# Patient Record
Sex: Male | Born: 1940 | Race: White | Hispanic: No | Marital: Married | Smoking: Never smoker
Health system: Southern US, Community
[De-identification: ages and names within clinical notes are randomized; demographics above are authoritative.]

## PROBLEM LIST (undated history)

## (undated) DIAGNOSIS — A419 Sepsis, unspecified organism: Secondary | ICD-10-CM

## (undated) DIAGNOSIS — N179 Acute kidney failure, unspecified: Secondary | ICD-10-CM

## (undated) DIAGNOSIS — K559 Vascular disorder of intestine, unspecified: Secondary | ICD-10-CM

## (undated) DIAGNOSIS — N289 Disorder of kidney and ureter, unspecified: Secondary | ICD-10-CM

## (undated) DIAGNOSIS — I4891 Unspecified atrial fibrillation: Secondary | ICD-10-CM

## (undated) HISTORY — PX: ABDOMINAL SURGERY: SHX537

---

## 2013-07-26 ENCOUNTER — Inpatient Hospital Stay: Payer: Self-pay | Admitting: Internal Medicine

## 2013-07-26 DIAGNOSIS — I959 Hypotension, unspecified: Secondary | ICD-10-CM | POA: Diagnosis not present

## 2013-07-26 DIAGNOSIS — J9589 Other postprocedural complications and disorders of respiratory system, not elsewhere classified: Secondary | ICD-10-CM | POA: Diagnosis not present

## 2013-07-26 DIAGNOSIS — R0989 Other specified symptoms and signs involving the circulatory and respiratory systems: Secondary | ICD-10-CM | POA: Diagnosis not present

## 2013-07-26 DIAGNOSIS — J9584 Transfusion-related acute lung injury (TRALI): Secondary | ICD-10-CM | POA: Diagnosis not present

## 2013-07-26 DIAGNOSIS — J9819 Other pulmonary collapse: Secondary | ICD-10-CM | POA: Diagnosis not present

## 2013-07-26 DIAGNOSIS — K450 Other specified abdominal hernia with obstruction, without gangrene: Secondary | ICD-10-CM | POA: Diagnosis present

## 2013-07-26 DIAGNOSIS — R141 Gas pain: Secondary | ICD-10-CM | POA: Diagnosis not present

## 2013-07-26 DIAGNOSIS — J95821 Acute postprocedural respiratory failure: Secondary | ICD-10-CM | POA: Diagnosis not present

## 2013-07-26 DIAGNOSIS — E872 Acidosis, unspecified: Secondary | ICD-10-CM | POA: Diagnosis not present

## 2013-07-26 DIAGNOSIS — I509 Heart failure, unspecified: Secondary | ICD-10-CM | POA: Diagnosis not present

## 2013-07-26 DIAGNOSIS — I4891 Unspecified atrial fibrillation: Secondary | ICD-10-CM | POA: Diagnosis not present

## 2013-07-26 DIAGNOSIS — N179 Acute kidney failure, unspecified: Secondary | ICD-10-CM | POA: Diagnosis not present

## 2013-07-26 DIAGNOSIS — Q619 Cystic kidney disease, unspecified: Secondary | ICD-10-CM | POA: Diagnosis not present

## 2013-07-26 DIAGNOSIS — R188 Other ascites: Secondary | ICD-10-CM | POA: Diagnosis present

## 2013-07-26 DIAGNOSIS — R34 Anuria and oliguria: Secondary | ICD-10-CM | POA: Diagnosis present

## 2013-07-26 DIAGNOSIS — R3989 Other symptoms and signs involving the genitourinary system: Secondary | ICD-10-CM | POA: Diagnosis present

## 2013-07-26 DIAGNOSIS — N186 End stage renal disease: Secondary | ICD-10-CM | POA: Diagnosis not present

## 2013-07-26 DIAGNOSIS — E860 Dehydration: Secondary | ICD-10-CM | POA: Diagnosis not present

## 2013-07-26 DIAGNOSIS — J90 Pleural effusion, not elsewhere classified: Secondary | ICD-10-CM | POA: Diagnosis present

## 2013-07-26 DIAGNOSIS — R6520 Severe sepsis without septic shock: Secondary | ICD-10-CM | POA: Diagnosis present

## 2013-07-26 DIAGNOSIS — I517 Cardiomegaly: Secondary | ICD-10-CM | POA: Diagnosis not present

## 2013-07-26 DIAGNOSIS — J984 Other disorders of lung: Secondary | ICD-10-CM | POA: Diagnosis not present

## 2013-07-26 DIAGNOSIS — K559 Vascular disorder of intestine, unspecified: Secondary | ICD-10-CM | POA: Diagnosis not present

## 2013-07-26 DIAGNOSIS — R4182 Altered mental status, unspecified: Secondary | ICD-10-CM | POA: Diagnosis not present

## 2013-07-26 DIAGNOSIS — I248 Other forms of acute ischemic heart disease: Secondary | ICD-10-CM | POA: Diagnosis present

## 2013-07-26 DIAGNOSIS — A419 Sepsis, unspecified organism: Secondary | ICD-10-CM | POA: Diagnosis not present

## 2013-07-26 DIAGNOSIS — M259 Joint disorder, unspecified: Secondary | ICD-10-CM | POA: Diagnosis not present

## 2013-07-26 DIAGNOSIS — J9600 Acute respiratory failure, unspecified whether with hypoxia or hypercapnia: Secondary | ICD-10-CM | POA: Diagnosis not present

## 2013-07-26 DIAGNOSIS — R748 Abnormal levels of other serum enzymes: Secondary | ICD-10-CM | POA: Diagnosis not present

## 2013-07-26 DIAGNOSIS — I259 Chronic ischemic heart disease, unspecified: Secondary | ICD-10-CM | POA: Diagnosis not present

## 2013-07-26 DIAGNOSIS — J9503 Malfunction of tracheostomy stoma: Secondary | ICD-10-CM | POA: Diagnosis not present

## 2013-07-26 DIAGNOSIS — D649 Anemia, unspecified: Secondary | ICD-10-CM | POA: Diagnosis not present

## 2013-07-26 DIAGNOSIS — R7989 Other specified abnormal findings of blood chemistry: Secondary | ICD-10-CM | POA: Diagnosis not present

## 2013-07-26 DIAGNOSIS — Z4682 Encounter for fitting and adjustment of non-vascular catheter: Secondary | ICD-10-CM | POA: Diagnosis not present

## 2013-07-26 DIAGNOSIS — Z9911 Dependence on respirator [ventilator] status: Secondary | ICD-10-CM | POA: Diagnosis not present

## 2013-07-26 DIAGNOSIS — R918 Other nonspecific abnormal finding of lung field: Secondary | ICD-10-CM | POA: Diagnosis not present

## 2013-07-26 DIAGNOSIS — E43 Unspecified severe protein-calorie malnutrition: Secondary | ICD-10-CM | POA: Diagnosis not present

## 2013-07-26 DIAGNOSIS — I471 Supraventricular tachycardia: Secondary | ICD-10-CM | POA: Diagnosis not present

## 2013-07-26 DIAGNOSIS — R112 Nausea with vomiting, unspecified: Secondary | ICD-10-CM | POA: Diagnosis not present

## 2013-07-26 DIAGNOSIS — R143 Flatulence: Secondary | ICD-10-CM | POA: Diagnosis not present

## 2013-07-26 DIAGNOSIS — R5381 Other malaise: Secondary | ICD-10-CM | POA: Diagnosis present

## 2013-07-26 DIAGNOSIS — D62 Acute posthemorrhagic anemia: Secondary | ICD-10-CM | POA: Diagnosis not present

## 2013-07-26 DIAGNOSIS — K922 Gastrointestinal hemorrhage, unspecified: Secondary | ICD-10-CM | POA: Diagnosis not present

## 2013-07-26 DIAGNOSIS — K56609 Unspecified intestinal obstruction, unspecified as to partial versus complete obstruction: Secondary | ICD-10-CM | POA: Diagnosis not present

## 2013-07-26 DIAGNOSIS — N19 Unspecified kidney failure: Secondary | ICD-10-CM | POA: Diagnosis not present

## 2013-07-26 DIAGNOSIS — R64 Cachexia: Secondary | ICD-10-CM | POA: Diagnosis present

## 2013-07-26 DIAGNOSIS — N170 Acute kidney failure with tubular necrosis: Secondary | ICD-10-CM | POA: Diagnosis present

## 2013-07-26 DIAGNOSIS — D72829 Elevated white blood cell count, unspecified: Secondary | ICD-10-CM | POA: Diagnosis not present

## 2013-07-26 DIAGNOSIS — K5669 Other intestinal obstruction: Secondary | ICD-10-CM | POA: Diagnosis not present

## 2013-07-26 DIAGNOSIS — R55 Syncope and collapse: Secondary | ICD-10-CM | POA: Diagnosis not present

## 2013-07-26 LAB — TROPONIN I
Troponin-I: 0.15 ng/mL — ABNORMAL HIGH
Troponin-I: 0.2 ng/mL — ABNORMAL HIGH
Troponin-I: 0.28 ng/mL — ABNORMAL HIGH

## 2013-07-26 LAB — CK TOTAL AND CKMB (NOT AT ARMC)
CK, Total: 650 U/L — ABNORMAL HIGH (ref 35–232)
CK, Total: 920 U/L — ABNORMAL HIGH (ref 35–232)
CK-MB: 9.2 ng/mL — ABNORMAL HIGH (ref 0.5–3.6)
CK-MB: 12.6 ng/mL — ABNORMAL HIGH (ref 0.5–3.6)

## 2013-07-26 LAB — CBC
HCT: 43.4 % (ref 40.0–52.0)
HGB: 14.3 g/dL (ref 13.0–18.0)
MCH: 31.4 pg (ref 26.0–34.0)
MCHC: 33.1 g/dL (ref 32.0–36.0)
MCV: 95 fL (ref 80–100)
Platelet: 221 10*3/uL (ref 150–440)
RBC: 4.56 10*6/uL (ref 4.40–5.90)
RDW: 13.4 % (ref 11.5–14.5)
WBC: 23.2 10*3/uL — ABNORMAL HIGH (ref 3.8–10.6)

## 2013-07-26 LAB — COMPREHENSIVE METABOLIC PANEL
ALBUMIN: 3.3 g/dL — AB (ref 3.4–5.0)
ALT: 24 U/L (ref 12–78)
AST: 28 U/L (ref 15–37)
Alkaline Phosphatase: 74 U/L
Anion Gap: 18 — ABNORMAL HIGH (ref 7–16)
BUN: 62 mg/dL — AB (ref 7–18)
Bilirubin,Total: 0.4 mg/dL (ref 0.2–1.0)
CALCIUM: 8.7 mg/dL (ref 8.5–10.1)
CO2: 18 mmol/L — AB (ref 21–32)
CREATININE: 4.91 mg/dL — AB (ref 0.60–1.30)
Chloride: 93 mmol/L — ABNORMAL LOW (ref 98–107)
EGFR (African American): 13 — ABNORMAL LOW
EGFR (Non-African Amer.): 11 — ABNORMAL LOW
Glucose: 119 mg/dL — ABNORMAL HIGH (ref 65–99)
Osmolality: 278 (ref 275–301)
Potassium: 3.8 mmol/L (ref 3.5–5.1)
Sodium: 129 mmol/L — ABNORMAL LOW (ref 136–145)
Total Protein: 7.4 g/dL (ref 6.4–8.2)

## 2013-07-26 LAB — GASTROCCULT (ARMC)
Occult Blood, Gastric: POSITIVE
Ph, Gastric: 3 (ref 1–3)

## 2013-07-26 LAB — LIPASE, BLOOD: LIPASE: 63 U/L — AB (ref 73–393)

## 2013-07-26 LAB — HEMOGLOBIN: HGB: 12.5 g/dL — ABNORMAL LOW (ref 13.0–18.0)

## 2013-07-27 DIAGNOSIS — N179 Acute kidney failure, unspecified: Secondary | ICD-10-CM

## 2013-07-27 LAB — BASIC METABOLIC PANEL
ANION GAP: 8 (ref 7–16)
Anion Gap: 10 (ref 7–16)
Anion Gap: 9 (ref 7–16)
Anion Gap: 6 — ABNORMAL LOW (ref 7–16)
Anion Gap: 10 (ref 7–16)
Anion Gap: 8 (ref 7–16)
BUN: 74 mg/dL — ABNORMAL HIGH (ref 7–18)
BUN: 72 mg/dL — ABNORMAL HIGH (ref 7–18)
BUN: 69 mg/dL — AB (ref 7–18)
BUN: 62 mg/dL — ABNORMAL HIGH (ref 7–18)
BUN: 60 mg/dL — AB (ref 7–18)
BUN: 58 mg/dL — ABNORMAL HIGH (ref 7–18)
CALCIUM: 6.5 mg/dL — AB (ref 8.5–10.1)
CHLORIDE: 102 mmol/L (ref 98–107)
CHLORIDE: 101 mmol/L (ref 98–107)
CO2: 23 mmol/L (ref 21–32)
CO2: 20 mmol/L — AB (ref 21–32)
CO2: 22 mmol/L (ref 21–32)
Calcium, Total: 6.3 mg/dL — CL (ref 8.5–10.1)
Calcium, Total: 6.7 mg/dL — CL (ref 8.5–10.1)
Calcium, Total: 6.6 mg/dL — CL (ref 8.5–10.1)
Calcium, Total: 7.1 mg/dL — ABNORMAL LOW (ref 8.5–10.1)
Calcium, Total: 7.2 mg/dL — ABNORMAL LOW (ref 8.5–10.1)
Chloride: 101 mmol/L (ref 98–107)
Chloride: 101 mmol/L (ref 98–107)
Chloride: 101 mmol/L (ref 98–107)
Chloride: 101 mmol/L (ref 98–107)
Co2: 19 mmol/L — ABNORMAL LOW (ref 21–32)
Co2: 20 mmol/L — ABNORMAL LOW (ref 21–32)
Co2: 23 mmol/L (ref 21–32)
Creatinine: 5.75 mg/dL — ABNORMAL HIGH (ref 0.60–1.30)
Creatinine: 6.36 mg/dL — ABNORMAL HIGH (ref 0.60–1.30)
Creatinine: 5.53 mg/dL — ABNORMAL HIGH (ref 0.60–1.30)
Creatinine: 5.14 mg/dL — ABNORMAL HIGH (ref 0.60–1.30)
Creatinine: 5.05 mg/dL — ABNORMAL HIGH (ref 0.60–1.30)
Creatinine: 4.85 mg/dL — ABNORMAL HIGH (ref 0.60–1.30)
EGFR (African American): 11 — ABNORMAL LOW
EGFR (African American): 12 — ABNORMAL LOW
EGFR (African American): 12 — ABNORMAL LOW
EGFR (African American): 13 — ABNORMAL LOW
EGFR (Non-African Amer.): 9 — ABNORMAL LOW
EGFR (Non-African Amer.): 8 — ABNORMAL LOW
EGFR (Non-African Amer.): 10 — ABNORMAL LOW
EGFR (Non-African Amer.): 10 — ABNORMAL LOW
EGFR (Non-African Amer.): 11 — ABNORMAL LOW
GFR CALC AF AMER: 9 — AB
GFR CALC AF AMER: 11 — AB
GFR CALC NON AF AMER: 11 — AB
GLUCOSE: 113 mg/dL — AB (ref 65–99)
GLUCOSE: 112 mg/dL — AB (ref 65–99)
GLUCOSE: 108 mg/dL — AB (ref 65–99)
Glucose: 124 mg/dL — ABNORMAL HIGH (ref 65–99)
Glucose: 137 mg/dL — ABNORMAL HIGH (ref 65–99)
Glucose: 114 mg/dL — ABNORMAL HIGH (ref 65–99)
OSMOLALITY: 283 (ref 275–301)
OSMOLALITY: 282 (ref 275–301)
OSMOLALITY: 282 (ref 275–301)
Osmolality: 284 (ref 275–301)
Osmolality: 282 (ref 275–301)
Osmolality: 282 (ref 275–301)
POTASSIUM: 4.5 mmol/L (ref 3.5–5.1)
POTASSIUM: 4.6 mmol/L (ref 3.5–5.1)
POTASSIUM: 4.5 mmol/L (ref 3.5–5.1)
Potassium: 4.4 mmol/L (ref 3.5–5.1)
Potassium: 4.5 mmol/L (ref 3.5–5.1)
Potassium: 4.6 mmol/L (ref 3.5–5.1)
Sodium: 130 mmol/L — ABNORMAL LOW (ref 136–145)
Sodium: 130 mmol/L — ABNORMAL LOW (ref 136–145)
Sodium: 130 mmol/L — ABNORMAL LOW (ref 136–145)
Sodium: 131 mmol/L — ABNORMAL LOW (ref 136–145)
Sodium: 132 mmol/L — ABNORMAL LOW (ref 136–145)
Sodium: 132 mmol/L — ABNORMAL LOW (ref 136–145)

## 2013-07-27 LAB — URINALYSIS, COMPLETE
Bilirubin,UR: NEGATIVE
Ketone: NEGATIVE
Nitrite: NEGATIVE
PH: 5 (ref 4.5–8.0)
RBC,UR: 82 /HPF (ref 0–5)
SPECIFIC GRAVITY: 1.012 (ref 1.003–1.030)
Squamous Epithelial: 1
WBC UR: 77 /HPF (ref 0–5)

## 2013-07-27 LAB — CBC WITH DIFFERENTIAL/PLATELET
BASOS ABS: 0 10*3/uL (ref 0.0–0.1)
Basophil %: 0.1 %
Eosinophil #: 0 10*3/uL (ref 0.0–0.7)
Eosinophil %: 0.2 %
HCT: 29.9 % — ABNORMAL LOW (ref 40.0–52.0)
HGB: 10.2 g/dL — ABNORMAL LOW (ref 13.0–18.0)
LYMPHS ABS: 1.9 10*3/uL (ref 1.0–3.6)
Lymphocyte %: 30.1 %
MCH: 32.5 pg (ref 26.0–34.0)
MCHC: 33.9 g/dL (ref 32.0–36.0)
MCV: 96 fL (ref 80–100)
Monocyte #: 0.7 x10 3/mm (ref 0.2–1.0)
Monocyte %: 11.1 %
NEUTROS PCT: 58.5 %
Neutrophil #: 3.8 10*3/uL (ref 1.4–6.5)
Platelet: 153 10*3/uL (ref 150–440)
RBC: 3.12 10*6/uL — ABNORMAL LOW (ref 4.40–5.90)
RDW: 13.5 % (ref 11.5–14.5)
WBC: 6.5 10*3/uL (ref 3.8–10.6)

## 2013-07-27 LAB — CREATININE, URINE, RANDOM: CREATININE, URINE RANDOM: 61.1 mg/dL (ref 30.0–125.0)

## 2013-07-27 LAB — HEMOGLOBIN
HGB: 10.5 g/dL — ABNORMAL LOW (ref 13.0–18.0)
HGB: 9.3 g/dL — ABNORMAL LOW (ref 13.0–18.0)

## 2013-07-27 LAB — LIPID PANEL
CHOLESTEROL: 54 mg/dL (ref 0–200)
HDL: 13 mg/dL — AB (ref 40–60)
Ldl Cholesterol, Calc: 14 mg/dL (ref 0–100)
TRIGLYCERIDES: 137 mg/dL (ref 0–200)
VLDL Cholesterol, Calc: 27 mg/dL (ref 5–40)

## 2013-07-27 LAB — TSH: THYROID STIMULATING HORM: 5.17 u[IU]/mL — AB

## 2013-07-27 LAB — PROTEIN, URINE, RANDOM: PROTEIN, RANDOM URINE: 521 mg/dL — AB (ref 0–12)

## 2013-07-28 LAB — BASIC METABOLIC PANEL
ANION GAP: 4 — AB (ref 7–16)
ANION GAP: 5 — AB (ref 7–16)
ANION GAP: 7 (ref 7–16)
Anion Gap: 6 — ABNORMAL LOW (ref 7–16)
Anion Gap: 7 (ref 7–16)
Anion Gap: 6 — ABNORMAL LOW (ref 7–16)
Anion Gap: 5 — ABNORMAL LOW (ref 7–16)
BUN: 53 mg/dL — ABNORMAL HIGH (ref 7–18)
BUN: 46 mg/dL — ABNORMAL HIGH (ref 7–18)
BUN: 36 mg/dL — ABNORMAL HIGH (ref 7–18)
BUN: 34 mg/dL — ABNORMAL HIGH (ref 7–18)
BUN: 40 mg/dL — AB (ref 7–18)
BUN: 55 mg/dL — AB (ref 7–18)
BUN: 31 mg/dL — ABNORMAL HIGH (ref 7–18)
CALCIUM: 7.6 mg/dL — AB (ref 8.5–10.1)
CALCIUM: 7.7 mg/dL — AB (ref 8.5–10.1)
CALCIUM: 7.2 mg/dL — AB (ref 8.5–10.1)
CHLORIDE: 106 mmol/L (ref 98–107)
CHLORIDE: 101 mmol/L (ref 98–107)
CHLORIDE: 104 mmol/L (ref 98–107)
CO2: 27 mmol/L (ref 21–32)
CO2: 24 mmol/L (ref 21–32)
CO2: 28 mmol/L (ref 21–32)
CREATININE: 3.65 mg/dL — AB (ref 0.60–1.30)
Calcium, Total: 7 mg/dL — CL (ref 8.5–10.1)
Calcium, Total: 7 mg/dL — CL (ref 8.5–10.1)
Calcium, Total: 7.2 mg/dL — ABNORMAL LOW (ref 8.5–10.1)
Calcium, Total: 7.5 mg/dL — ABNORMAL LOW (ref 8.5–10.1)
Chloride: 100 mmol/L (ref 98–107)
Chloride: 101 mmol/L (ref 98–107)
Chloride: 104 mmol/L (ref 98–107)
Chloride: 105 mmol/L (ref 98–107)
Co2: 25 mmol/L (ref 21–32)
Co2: 26 mmol/L (ref 21–32)
Co2: 27 mmol/L (ref 21–32)
Co2: 28 mmol/L (ref 21–32)
Creatinine: 2.09 mg/dL — ABNORMAL HIGH (ref 0.60–1.30)
Creatinine: 2.38 mg/dL — ABNORMAL HIGH (ref 0.60–1.30)
Creatinine: 2.67 mg/dL — ABNORMAL HIGH (ref 0.60–1.30)
Creatinine: 3.22 mg/dL — ABNORMAL HIGH (ref 0.60–1.30)
Creatinine: 4.47 mg/dL — ABNORMAL HIGH (ref 0.60–1.30)
Creatinine: 4.51 mg/dL — ABNORMAL HIGH (ref 0.60–1.30)
EGFR (African American): 14 — ABNORMAL LOW
EGFR (African American): 21 — ABNORMAL LOW
EGFR (African American): 27 — ABNORMAL LOW
EGFR (African American): 31 — ABNORMAL LOW
EGFR (African American): 36 — ABNORMAL LOW
EGFR (Non-African Amer.): 12 — ABNORMAL LOW
EGFR (Non-African Amer.): 16 — ABNORMAL LOW
EGFR (Non-African Amer.): 18 — ABNORMAL LOW
EGFR (Non-African Amer.): 23 — ABNORMAL LOW
EGFR (Non-African Amer.): 27 — ABNORMAL LOW
EGFR (Non-African Amer.): 31 — ABNORMAL LOW
GFR CALC AF AMER: 18 — AB
GFR CALC AF AMER: 14 — AB
GFR CALC NON AF AMER: 12 — AB
GLUCOSE: 109 mg/dL — AB (ref 65–99)
GLUCOSE: 130 mg/dL — AB (ref 65–99)
GLUCOSE: 93 mg/dL (ref 65–99)
Glucose: 130 mg/dL — ABNORMAL HIGH (ref 65–99)
Glucose: 83 mg/dL (ref 65–99)
Glucose: 86 mg/dL (ref 65–99)
Glucose: 93 mg/dL (ref 65–99)
OSMOLALITY: 281 (ref 275–301)
OSMOLALITY: 281 (ref 275–301)
OSMOLALITY: 283 (ref 275–301)
Osmolality: 279 (ref 275–301)
Osmolality: 281 (ref 275–301)
Osmolality: 282 (ref 275–301)
Osmolality: 281 (ref 275–301)
POTASSIUM: 4.3 mmol/L (ref 3.5–5.1)
POTASSIUM: 4.2 mmol/L (ref 3.5–5.1)
POTASSIUM: 4.3 mmol/L (ref 3.5–5.1)
POTASSIUM: 4.3 mmol/L (ref 3.5–5.1)
POTASSIUM: 4.4 mmol/L (ref 3.5–5.1)
Potassium: 4 mmol/L (ref 3.5–5.1)
Potassium: 3.8 mmol/L (ref 3.5–5.1)
SODIUM: 134 mmol/L — AB (ref 136–145)
SODIUM: 137 mmol/L (ref 136–145)
SODIUM: 138 mmol/L (ref 136–145)
SODIUM: 131 mmol/L — AB (ref 136–145)
Sodium: 132 mmol/L — ABNORMAL LOW (ref 136–145)
Sodium: 137 mmol/L (ref 136–145)
Sodium: 137 mmol/L (ref 136–145)

## 2013-07-28 LAB — CBC WITH DIFFERENTIAL/PLATELET
Basophil #: 0 10*3/uL (ref 0.0–0.1)
Basophil %: 0.2 %
Eosinophil #: 0 10*3/uL (ref 0.0–0.7)
Eosinophil %: 0.2 %
HCT: 24.1 % — ABNORMAL LOW (ref 40.0–52.0)
HGB: 8.5 g/dL — AB (ref 13.0–18.0)
Lymphocyte #: 0.5 10*3/uL — ABNORMAL LOW (ref 1.0–3.6)
Lymphocyte %: 3.9 %
MCH: 32.6 pg (ref 26.0–34.0)
MCHC: 35 g/dL (ref 32.0–36.0)
MCV: 93 fL (ref 80–100)
MONOS PCT: 7 %
Monocyte #: 1 x10 3/mm (ref 0.2–1.0)
Neutrophil #: 12.1 10*3/uL — ABNORMAL HIGH (ref 1.4–6.5)
Neutrophil %: 88.7 %
Platelet: 114 10*3/uL — ABNORMAL LOW (ref 150–440)
RBC: 2.6 10*6/uL — AB (ref 4.40–5.90)
RDW: 13 % (ref 11.5–14.5)
WBC: 13.6 10*3/uL — AB (ref 3.8–10.6)

## 2013-07-28 LAB — PHOSPHORUS: Phosphorus: 4 mg/dL (ref 2.5–4.9)

## 2013-07-29 LAB — TPN PANEL
ACTIVATED PTT: 42.7 s — AB (ref 23.6–35.9)
ALBUMIN: 1.4 g/dL — AB (ref 3.4–5.0)
AST: 71 U/L — AB (ref 15–37)
Alkaline Phosphatase: 39 U/L — ABNORMAL LOW
Anion Gap: 5 — ABNORMAL LOW (ref 7–16)
BUN: 26 mg/dL — ABNORMAL HIGH (ref 7–18)
CO2: 29 mmol/L (ref 21–32)
CREATININE: 1.77 mg/dL — AB (ref 0.60–1.30)
Calcium, Total: 7.7 mg/dL — ABNORMAL LOW (ref 8.5–10.1)
Chloride: 106 mmol/L (ref 98–107)
Cholesterol: 71 mg/dL (ref 0–200)
EGFR (African American): 44 — ABNORMAL LOW
EGFR (Non-African Amer.): 38 — ABNORMAL LOW
Glucose: 107 mg/dL — ABNORMAL HIGH (ref 65–99)
HGB: 6.6 g/dL — ABNORMAL LOW (ref 13.0–18.0)
INR: 1.3
Magnesium: 1.7 mg/dL — ABNORMAL LOW
Osmolality: 285 (ref 275–301)
POTASSIUM: 3.8 mmol/L (ref 3.5–5.1)
Phosphorus: 2.4 mg/dL — ABNORMAL LOW (ref 2.5–4.9)
Platelet: 78 10*3/uL — ABNORMAL LOW (ref 150–440)
Prothrombin Time: 16.4 secs — ABNORMAL HIGH (ref 11.5–14.7)
SODIUM: 140 mmol/L (ref 136–145)
TOTAL PROTEIN: 4.4 g/dL — AB (ref 6.4–8.2)
Triglycerides: 331 mg/dL — ABNORMAL HIGH (ref 0–200)
WBC: 11.2 10*3/uL — ABNORMAL HIGH (ref 3.8–10.6)

## 2013-07-29 LAB — BASIC METABOLIC PANEL
ANION GAP: 3 — AB (ref 7–16)
ANION GAP: 1 — AB (ref 7–16)
Anion Gap: 2 — ABNORMAL LOW (ref 7–16)
Anion Gap: 4 — ABNORMAL LOW (ref 7–16)
Anion Gap: 4 — ABNORMAL LOW (ref 7–16)
BUN: 24 mg/dL — ABNORMAL HIGH (ref 7–18)
BUN: 25 mg/dL — AB (ref 7–18)
BUN: 25 mg/dL — ABNORMAL HIGH (ref 7–18)
BUN: 26 mg/dL — ABNORMAL HIGH (ref 7–18)
BUN: 28 mg/dL — ABNORMAL HIGH (ref 7–18)
CALCIUM: 8.2 mg/dL — AB (ref 8.5–10.1)
CALCIUM: 7.6 mg/dL — AB (ref 8.5–10.1)
CHLORIDE: 107 mmol/L (ref 98–107)
CHLORIDE: 109 mmol/L — AB (ref 98–107)
CHLORIDE: 108 mmol/L — AB (ref 98–107)
CO2: 32 mmol/L (ref 21–32)
CO2: 32 mmol/L (ref 21–32)
CO2: 30 mmol/L (ref 21–32)
CREATININE: 1.76 mg/dL — AB (ref 0.60–1.30)
Calcium, Total: 7.7 mg/dL — ABNORMAL LOW (ref 8.5–10.1)
Calcium, Total: 8.1 mg/dL — ABNORMAL LOW (ref 8.5–10.1)
Calcium, Total: 8.1 mg/dL — ABNORMAL LOW (ref 8.5–10.1)
Chloride: 108 mmol/L — ABNORMAL HIGH (ref 98–107)
Chloride: 107 mmol/L (ref 98–107)
Co2: 29 mmol/L (ref 21–32)
Co2: 31 mmol/L (ref 21–32)
Creatinine: 1.6 mg/dL — ABNORMAL HIGH (ref 0.60–1.30)
Creatinine: 1.62 mg/dL — ABNORMAL HIGH (ref 0.60–1.30)
Creatinine: 1.67 mg/dL — ABNORMAL HIGH (ref 0.60–1.30)
Creatinine: 2.08 mg/dL — ABNORMAL HIGH (ref 0.60–1.30)
EGFR (African American): 36 — ABNORMAL LOW
EGFR (African American): 44 — ABNORMAL LOW
EGFR (African American): 47 — ABNORMAL LOW
EGFR (African American): 49 — ABNORMAL LOW
EGFR (Non-African Amer.): 31 — ABNORMAL LOW
EGFR (Non-African Amer.): 41 — ABNORMAL LOW
EGFR (Non-African Amer.): 42 — ABNORMAL LOW
GFR CALC AF AMER: 50 — AB
GFR CALC NON AF AMER: 38 — AB
GFR CALC NON AF AMER: 43 — AB
GLUCOSE: 106 mg/dL — AB (ref 65–99)
GLUCOSE: 110 mg/dL — AB (ref 65–99)
GLUCOSE: 121 mg/dL — AB (ref 65–99)
Glucose: 122 mg/dL — ABNORMAL HIGH (ref 65–99)
Glucose: 82 mg/dL (ref 65–99)
Osmolality: 285 (ref 275–301)
Osmolality: 286 (ref 275–301)
Osmolality: 286 (ref 275–301)
Osmolality: 288 (ref 275–301)
Osmolality: 290 (ref 275–301)
POTASSIUM: 4 mmol/L (ref 3.5–5.1)
Potassium: 3.8 mmol/L (ref 3.5–5.1)
Potassium: 3.8 mmol/L (ref 3.5–5.1)
Potassium: 4.1 mmol/L (ref 3.5–5.1)
Potassium: 4.2 mmol/L (ref 3.5–5.1)
SODIUM: 142 mmol/L (ref 136–145)
SODIUM: 141 mmol/L (ref 136–145)
SODIUM: 141 mmol/L (ref 136–145)
Sodium: 140 mmol/L (ref 136–145)
Sodium: 143 mmol/L (ref 136–145)

## 2013-07-29 LAB — CBC WITH DIFFERENTIAL/PLATELET
BASOS ABS: 0 10*3/uL (ref 0.0–0.1)
Basophil %: 0.4 %
EOS PCT: 1 %
Eosinophil #: 0.1 10*3/uL (ref 0.0–0.7)
HCT: 19.2 % — ABNORMAL LOW (ref 40.0–52.0)
LYMPHS ABS: 0.6 10*3/uL — AB (ref 1.0–3.6)
Lymphocyte %: 5.6 %
MCH: 32 pg (ref 26.0–34.0)
MCHC: 34.3 g/dL (ref 32.0–36.0)
MCV: 93 fL (ref 80–100)
MONO ABS: 1.1 x10 3/mm — AB (ref 0.2–1.0)
MONOS PCT: 10.1 %
NEUTROS ABS: 9.3 10*3/uL — AB (ref 1.4–6.5)
Neutrophil %: 82.9 %
RBC: 2.06 10*6/uL — ABNORMAL LOW (ref 4.40–5.90)
RDW: 13.4 % (ref 11.5–14.5)

## 2013-07-30 DIAGNOSIS — R7989 Other specified abnormal findings of blood chemistry: Secondary | ICD-10-CM

## 2013-07-30 DIAGNOSIS — I471 Supraventricular tachycardia: Secondary | ICD-10-CM

## 2013-07-30 LAB — BASIC METABOLIC PANEL
Anion Gap: 0 — ABNORMAL LOW (ref 7–16)
Anion Gap: 0 — ABNORMAL LOW (ref 7–16)
Anion Gap: 1 — ABNORMAL LOW (ref 7–16)
Anion Gap: 2 — ABNORMAL LOW (ref 7–16)
Anion Gap: 3 — ABNORMAL LOW (ref 7–16)
Anion Gap: 4 — ABNORMAL LOW (ref 7–16)
BUN: 23 mg/dL — AB (ref 7–18)
BUN: 23 mg/dL — ABNORMAL HIGH (ref 7–18)
BUN: 24 mg/dL — AB (ref 7–18)
BUN: 25 mg/dL — AB (ref 7–18)
BUN: 25 mg/dL — ABNORMAL HIGH (ref 7–18)
BUN: 25 mg/dL — ABNORMAL HIGH (ref 7–18)
BUN: 25 mg/dL — ABNORMAL HIGH (ref 7–18)
CALCIUM: 8.2 mg/dL — AB (ref 8.5–10.1)
CHLORIDE: 109 mmol/L — AB (ref 98–107)
CHLORIDE: 108 mmol/L — AB (ref 98–107)
CHLORIDE: 107 mmol/L (ref 98–107)
CO2: 29 mmol/L (ref 21–32)
CO2: 33 mmol/L — AB (ref 21–32)
CO2: 30 mmol/L (ref 21–32)
CO2: 33 mmol/L — AB (ref 21–32)
CREATININE: 1.57 mg/dL — AB (ref 0.60–1.30)
Calcium, Total: 8 mg/dL — ABNORMAL LOW (ref 8.5–10.1)
Calcium, Total: 8.1 mg/dL — ABNORMAL LOW (ref 8.5–10.1)
Calcium, Total: 8.1 mg/dL — ABNORMAL LOW (ref 8.5–10.1)
Calcium, Total: 8.1 mg/dL — ABNORMAL LOW (ref 8.5–10.1)
Calcium, Total: 8.2 mg/dL — ABNORMAL LOW (ref 8.5–10.1)
Calcium, Total: 8.6 mg/dL (ref 8.5–10.1)
Chloride: 106 mmol/L (ref 98–107)
Chloride: 107 mmol/L (ref 98–107)
Chloride: 107 mmol/L (ref 98–107)
Chloride: 109 mmol/L — ABNORMAL HIGH (ref 98–107)
Co2: 33 mmol/L — ABNORMAL HIGH (ref 21–32)
Co2: 31 mmol/L (ref 21–32)
Co2: 32 mmol/L (ref 21–32)
Creatinine: 1.37 mg/dL — ABNORMAL HIGH (ref 0.60–1.30)
Creatinine: 1.43 mg/dL — ABNORMAL HIGH (ref 0.60–1.30)
Creatinine: 1.47 mg/dL — ABNORMAL HIGH (ref 0.60–1.30)
Creatinine: 1.51 mg/dL — ABNORMAL HIGH (ref 0.60–1.30)
Creatinine: 1.59 mg/dL — ABNORMAL HIGH (ref 0.60–1.30)
Creatinine: 1.62 mg/dL — ABNORMAL HIGH (ref 0.60–1.30)
EGFR (African American): 49 — ABNORMAL LOW
EGFR (African American): 53 — ABNORMAL LOW
EGFR (African American): 55 — ABNORMAL LOW
EGFR (African American): 57 — ABNORMAL LOW
EGFR (African American): >60
EGFR (Non-African Amer.): 42 — ABNORMAL LOW
EGFR (Non-African Amer.): 43 — ABNORMAL LOW
EGFR (Non-African Amer.): 46 — ABNORMAL LOW
EGFR (Non-African Amer.): 48 — ABNORMAL LOW
GFR CALC AF AMER: 50 — AB
GFR CALC AF AMER: 51 — AB
GFR CALC NON AF AMER: 44 — AB
GFR CALC NON AF AMER: 49 — AB
GFR CALC NON AF AMER: 52 — AB
GLUCOSE: 140 mg/dL — AB (ref 65–99)
GLUCOSE: 163 mg/dL — AB (ref 65–99)
GLUCOSE: 139 mg/dL — AB (ref 65–99)
GLUCOSE: 164 mg/dL — AB (ref 65–99)
Glucose: 166 mg/dL — ABNORMAL HIGH (ref 65–99)
Glucose: 157 mg/dL — ABNORMAL HIGH (ref 65–99)
Glucose: 153 mg/dL — ABNORMAL HIGH (ref 65–99)
OSMOLALITY: 286 (ref 275–301)
OSMOLALITY: 292 (ref 275–301)
Osmolality: 287 (ref 275–301)
Osmolality: 291 (ref 275–301)
Osmolality: 287 (ref 275–301)
Osmolality: 284 (ref 275–301)
Osmolality: 284 (ref 275–301)
POTASSIUM: 4.5 mmol/L (ref 3.5–5.1)
POTASSIUM: 4.3 mmol/L (ref 3.5–5.1)
Potassium: 4.1 mmol/L (ref 3.5–5.1)
Potassium: 4.1 mmol/L (ref 3.5–5.1)
Potassium: 4.3 mmol/L (ref 3.5–5.1)
Potassium: 4.5 mmol/L (ref 3.5–5.1)
Potassium: 4.6 mmol/L (ref 3.5–5.1)
SODIUM: 142 mmol/L (ref 136–145)
SODIUM: 140 mmol/L (ref 136–145)
SODIUM: 139 mmol/L (ref 136–145)
SODIUM: 139 mmol/L (ref 136–145)
Sodium: 140 mmol/L (ref 136–145)
Sodium: 140 mmol/L (ref 136–145)
Sodium: 143 mmol/L (ref 136–145)

## 2013-07-30 LAB — CBC WITH DIFFERENTIAL/PLATELET
Basophil #: 0 10*3/uL (ref 0.0–0.1)
Basophil %: 0.2 %
EOS PCT: 0.3 %
Eosinophil #: 0.1 10*3/uL (ref 0.0–0.7)
HCT: 31.5 % — ABNORMAL LOW (ref 40.0–52.0)
HGB: 10.7 g/dL — ABNORMAL LOW (ref 13.0–18.0)
LYMPHS PCT: 5.2 %
Lymphocyte #: 1 10*3/uL (ref 1.0–3.6)
MCH: 30.2 pg (ref 26.0–34.0)
MCHC: 33.9 g/dL (ref 32.0–36.0)
MCV: 89 fL (ref 80–100)
MONO ABS: 1.4 x10 3/mm — AB (ref 0.2–1.0)
Monocyte %: 7 %
NEUTROS ABS: 17.5 10*3/uL — AB (ref 1.4–6.5)
Neutrophil %: 87.3 %
PLATELETS: 91 10*3/uL — AB (ref 150–440)
RBC: 3.54 10*6/uL — ABNORMAL LOW (ref 4.40–5.90)
RDW: 17.1 % — ABNORMAL HIGH (ref 11.5–14.5)
WBC: 20 10*3/uL — ABNORMAL HIGH (ref 3.8–10.6)

## 2013-07-30 LAB — CK-MB
CK-MB: 3.2 ng/mL (ref 0.5–3.6)
CK-MB: 3.2 ng/mL (ref 0.5–3.6)
CK-MB: 3 ng/mL (ref 0.5–3.6)

## 2013-07-30 LAB — TROPONIN I
TROPONIN-I: 0.48 ng/mL — AB
Troponin-I: 1.1 ng/mL — ABNORMAL HIGH
Troponin-I: 0.65 ng/mL — ABNORMAL HIGH

## 2013-07-30 LAB — PROT IMMUNOELECTROPHORES(ARMC)

## 2013-07-30 LAB — PHOSPHORUS: PHOSPHORUS: 2.6 mg/dL (ref 2.5–4.9)

## 2013-07-30 LAB — MAGNESIUM: MAGNESIUM: 2 mg/dL

## 2013-07-31 LAB — BASIC METABOLIC PANEL
ANION GAP: 2 — AB (ref 7–16)
Anion Gap: 1 — ABNORMAL LOW (ref 7–16)
Anion Gap: 2 — ABNORMAL LOW (ref 7–16)
BUN: 22 mg/dL — AB (ref 7–18)
BUN: 23 mg/dL — ABNORMAL HIGH (ref 7–18)
BUN: 23 mg/dL — AB (ref 7–18)
BUN: 25 mg/dL — AB (ref 7–18)
CALCIUM: 8 mg/dL — AB (ref 8.5–10.1)
CHLORIDE: 106 mmol/L (ref 98–107)
CHLORIDE: 107 mmol/L (ref 98–107)
CO2: 32 mmol/L (ref 21–32)
CREATININE: 1.34 mg/dL — AB (ref 0.60–1.30)
CREATININE: 1.34 mg/dL — AB (ref 0.60–1.30)
Calcium, Total: 8.1 mg/dL — ABNORMAL LOW (ref 8.5–10.1)
Calcium, Total: 8.5 mg/dL (ref 8.5–10.1)
Calcium, Total: 7.7 mg/dL — ABNORMAL LOW (ref 8.5–10.1)
Chloride: 106 mmol/L (ref 98–107)
Chloride: 107 mmol/L (ref 98–107)
Co2: 31 mmol/L (ref 21–32)
Co2: 32 mmol/L (ref 21–32)
Co2: 31 mmol/L (ref 21–32)
Creatinine: 1.4 mg/dL — ABNORMAL HIGH (ref 0.60–1.30)
Creatinine: 1.58 mg/dL — ABNORMAL HIGH (ref 0.60–1.30)
EGFR (African American): >60
EGFR (African American): 59 — ABNORMAL LOW
EGFR (African American): >60
EGFR (African American): 51 — ABNORMAL LOW
EGFR (Non-African Amer.): 53 — ABNORMAL LOW
EGFR (Non-African Amer.): 53 — ABNORMAL LOW
EGFR (Non-African Amer.): 44 — ABNORMAL LOW
GFR CALC NON AF AMER: 51 — AB
GLUCOSE: 190 mg/dL — AB (ref 65–99)
Glucose: 153 mg/dL — ABNORMAL HIGH (ref 65–99)
Glucose: 163 mg/dL — ABNORMAL HIGH (ref 65–99)
Glucose: 149 mg/dL — ABNORMAL HIGH (ref 65–99)
OSMOLALITY: 284 (ref 275–301)
OSMOLALITY: 289 (ref 275–301)
Osmolality: 283 (ref 275–301)
Osmolality: 284 (ref 275–301)
POTASSIUM: 4.2 mmol/L (ref 3.5–5.1)
Potassium: 4.3 mmol/L (ref 3.5–5.1)
Potassium: 4.4 mmol/L (ref 3.5–5.1)
Potassium: 4.4 mmol/L (ref 3.5–5.1)
SODIUM: 138 mmol/L (ref 136–145)
SODIUM: 139 mmol/L (ref 136–145)
SODIUM: 140 mmol/L (ref 136–145)
Sodium: 139 mmol/L (ref 136–145)

## 2013-07-31 LAB — CBC WITH DIFFERENTIAL/PLATELET
EOS PCT: 3 %
HCT: 28.2 % — ABNORMAL LOW (ref 40.0–52.0)
HGB: 9.6 g/dL — ABNORMAL LOW (ref 13.0–18.0)
Lymphocytes: 7 %
MCH: 30.5 pg (ref 26.0–34.0)
MCHC: 33.9 g/dL (ref 32.0–36.0)
MCV: 90 fL (ref 80–100)
Monocytes: 8 %
Myelocyte: 1 %
PLATELETS: 62 10*3/uL — AB (ref 150–440)
RBC: 3.14 10*6/uL — ABNORMAL LOW (ref 4.40–5.90)
RDW: 16.5 % — ABNORMAL HIGH (ref 11.5–14.5)
Segmented Neutrophils: 81 %
WBC: 15.2 10*3/uL — ABNORMAL HIGH (ref 3.8–10.6)

## 2013-07-31 LAB — CULTURE, BLOOD (SINGLE)

## 2013-07-31 LAB — PHOSPHORUS: PHOSPHORUS: 1.7 mg/dL — AB (ref 2.5–4.9)

## 2013-07-31 LAB — MAGNESIUM: Magnesium: 1.7 mg/dL — ABNORMAL LOW

## 2013-08-01 DIAGNOSIS — I517 Cardiomegaly: Secondary | ICD-10-CM

## 2013-08-01 LAB — BASIC METABOLIC PANEL WITH GFR
Anion Gap: 2 — ABNORMAL LOW
Anion Gap: 2 — ABNORMAL LOW
BUN: 25 mg/dL — ABNORMAL HIGH
BUN: 24 mg/dL — ABNORMAL HIGH
Calcium, Total: 8 mg/dL — ABNORMAL LOW
Calcium, Total: 8 mg/dL — ABNORMAL LOW
Chloride: 108 mmol/L — ABNORMAL HIGH
Chloride: 106 mmol/L
Co2: 31 mmol/L
Co2: 30 mmol/L
Creatinine: 1.6 mg/dL — ABNORMAL HIGH
Creatinine: 1.56 mg/dL — ABNORMAL HIGH
EGFR (African American): 50 — ABNORMAL LOW
EGFR (African American): 51 — ABNORMAL LOW
EGFR (Non-African Amer.): 43 — ABNORMAL LOW
EGFR (Non-African Amer.): 44 — ABNORMAL LOW
Glucose: 176 mg/dL — ABNORMAL HIGH
Glucose: 223 mg/dL — ABNORMAL HIGH
Osmolality: 290
Osmolality: 287
Potassium: 4.5 mmol/L
Potassium: 4.4 mmol/L
Sodium: 141 mmol/L
Sodium: 138 mmol/L

## 2013-08-01 LAB — CBC WITH DIFFERENTIAL/PLATELET
Bands: 2 %
Eosinophil: 5 %
HCT: 23.9 % — ABNORMAL LOW
HGB: 8.1 g/dL — ABNORMAL LOW
Lymphocytes: 7 %
MCH: 30.7 pg
MCHC: 33.8 g/dL
MCV: 91 fL
Metamyelocyte: 1 %
Monocytes: 5 %
Myelocyte: 2 %
Platelet: 69 x10 3/mm 3 — ABNORMAL LOW
RBC: 2.63 x10 6/mm 3 — ABNORMAL LOW
RDW: 16.2 % — ABNORMAL HIGH
Segmented Neutrophils: 78 %
WBC: 16.3 x10 3/mm 3 — ABNORMAL HIGH

## 2013-08-01 LAB — BASIC METABOLIC PANEL
Anion Gap: 3 — ABNORMAL LOW (ref 7–16)
Anion Gap: 2 — ABNORMAL LOW (ref 7–16)
Anion Gap: 2 — ABNORMAL LOW (ref 7–16)
BUN: 28 mg/dL — ABNORMAL HIGH (ref 7–18)
BUN: 27 mg/dL — ABNORMAL HIGH (ref 7–18)
BUN: 24 mg/dL — AB (ref 7–18)
CALCIUM: 7.9 mg/dL — AB (ref 8.5–10.1)
CALCIUM: 8 mg/dL — AB (ref 8.5–10.1)
CHLORIDE: 106 mmol/L (ref 98–107)
CO2: 30 mmol/L (ref 21–32)
Calcium, Total: 7.8 mg/dL — ABNORMAL LOW (ref 8.5–10.1)
Chloride: 107 mmol/L (ref 98–107)
Chloride: 106 mmol/L (ref 98–107)
Co2: 30 mmol/L (ref 21–32)
Co2: 31 mmol/L (ref 21–32)
Creatinine: 1.67 mg/dL — ABNORMAL HIGH (ref 0.60–1.30)
Creatinine: 1.56 mg/dL — ABNORMAL HIGH (ref 0.60–1.30)
Creatinine: 1.51 mg/dL — ABNORMAL HIGH (ref 0.60–1.30)
EGFR (African American): 47 — ABNORMAL LOW
EGFR (African American): 53 — ABNORMAL LOW
EGFR (Non-African Amer.): 44 — ABNORMAL LOW
EGFR (Non-African Amer.): 46 — ABNORMAL LOW
GFR CALC AF AMER: 51 — AB
GFR CALC NON AF AMER: 41 — AB
GLUCOSE: 167 mg/dL — AB (ref 65–99)
Glucose: 167 mg/dL — ABNORMAL HIGH (ref 65–99)
Glucose: 174 mg/dL — ABNORMAL HIGH (ref 65–99)
OSMOLALITY: 285 (ref 275–301)
Osmolality: 289 (ref 275–301)
Osmolality: 286 (ref 275–301)
POTASSIUM: 4.4 mmol/L (ref 3.5–5.1)
Potassium: 4.4 mmol/L (ref 3.5–5.1)
Potassium: 4.4 mmol/L (ref 3.5–5.1)
SODIUM: 140 mmol/L (ref 136–145)
Sodium: 138 mmol/L (ref 136–145)
Sodium: 139 mmol/L (ref 136–145)

## 2013-08-01 LAB — MAGNESIUM: Magnesium: 1.8 mg/dL

## 2013-08-01 LAB — PATHOLOGY REPORT

## 2013-08-01 LAB — PHOSPHORUS: Phosphorus: 1.8 mg/dL — ABNORMAL LOW (ref 2.5–4.9)

## 2013-08-02 DIAGNOSIS — I4891 Unspecified atrial fibrillation: Secondary | ICD-10-CM

## 2013-08-02 DIAGNOSIS — R7989 Other specified abnormal findings of blood chemistry: Secondary | ICD-10-CM

## 2013-08-02 DIAGNOSIS — I471 Supraventricular tachycardia: Secondary | ICD-10-CM

## 2013-08-02 LAB — CBC WITH DIFFERENTIAL/PLATELET
Bands: 2 %
Eosinophil: 2 %
HCT: 23 % — AB (ref 40.0–52.0)
HGB: 7.6 g/dL — ABNORMAL LOW (ref 13.0–18.0)
LYMPHS PCT: 7 %
MCH: 29.9 pg (ref 26.0–34.0)
MCHC: 32.9 g/dL (ref 32.0–36.0)
MCV: 91 fL (ref 80–100)
Metamyelocyte: 4 %
Monocytes: 2 %
Myelocyte: 2 %
PLATELETS: 81 10*3/uL — AB (ref 150–440)
RBC: 2.52 10*6/uL — ABNORMAL LOW (ref 4.40–5.90)
RDW: 15.9 % — ABNORMAL HIGH (ref 11.5–14.5)
SEGMENTED NEUTROPHILS: 81 %
WBC: 15.4 10*3/uL — ABNORMAL HIGH (ref 3.8–10.6)

## 2013-08-02 LAB — BASIC METABOLIC PANEL
ANION GAP: 3 — AB (ref 7–16)
ANION GAP: 1 — AB (ref 7–16)
ANION GAP: 1 — AB (ref 7–16)
Anion Gap: 2 — ABNORMAL LOW (ref 7–16)
BUN: 23 mg/dL — AB (ref 7–18)
BUN: 24 mg/dL — ABNORMAL HIGH (ref 7–18)
BUN: 25 mg/dL — ABNORMAL HIGH (ref 7–18)
BUN: 32 mg/dL — AB (ref 7–18)
CHLORIDE: 105 mmol/L (ref 98–107)
CHLORIDE: 105 mmol/L (ref 98–107)
CHLORIDE: 104 mmol/L (ref 98–107)
CO2: 30 mmol/L (ref 21–32)
CO2: 31 mmol/L (ref 21–32)
CO2: 30 mmol/L (ref 21–32)
CREATININE: 1.52 mg/dL — AB (ref 0.60–1.30)
CREATININE: 1.56 mg/dL — AB (ref 0.60–1.30)
Calcium, Total: 8.1 mg/dL — ABNORMAL LOW (ref 8.5–10.1)
Calcium, Total: 8.6 mg/dL (ref 8.5–10.1)
Calcium, Total: 8.3 mg/dL — ABNORMAL LOW (ref 8.5–10.1)
Calcium, Total: 7.9 mg/dL — ABNORMAL LOW (ref 8.5–10.1)
Chloride: 107 mmol/L (ref 98–107)
Co2: 32 mmol/L (ref 21–32)
Creatinine: 1.4 mg/dL — ABNORMAL HIGH (ref 0.60–1.30)
Creatinine: 1.74 mg/dL — ABNORMAL HIGH (ref 0.60–1.30)
EGFR (African American): 59 — ABNORMAL LOW
EGFR (African American): 51 — ABNORMAL LOW
EGFR (African American): 45 — ABNORMAL LOW
EGFR (Non-African Amer.): 51 — ABNORMAL LOW
EGFR (Non-African Amer.): 44 — ABNORMAL LOW
EGFR (Non-African Amer.): 39 — ABNORMAL LOW
GFR CALC AF AMER: 53 — AB
GFR CALC NON AF AMER: 46 — AB
GLUCOSE: 166 mg/dL — AB (ref 65–99)
GLUCOSE: 195 mg/dL — AB (ref 65–99)
GLUCOSE: 248 mg/dL — AB (ref 65–99)
Glucose: 356 mg/dL — ABNORMAL HIGH (ref 65–99)
OSMOLALITY: 285 (ref 275–301)
Osmolality: 287 (ref 275–301)
Osmolality: 288 (ref 275–301)
Osmolality: 291 (ref 275–301)
Potassium: 4.5 mmol/L (ref 3.5–5.1)
Potassium: 4.7 mmol/L (ref 3.5–5.1)
Potassium: 5.3 mmol/L — ABNORMAL HIGH (ref 3.5–5.1)
Potassium: 5.1 mmol/L (ref 3.5–5.1)
SODIUM: 138 mmol/L (ref 136–145)
Sodium: 140 mmol/L (ref 136–145)
Sodium: 138 mmol/L (ref 136–145)
Sodium: 135 mmol/L — ABNORMAL LOW (ref 136–145)

## 2013-08-02 LAB — HEPATIC FUNCTION PANEL A (ARMC)
ALK PHOS: 84 U/L
Albumin: 1.1 g/dL — ABNORMAL LOW (ref 3.4–5.0)
Bilirubin, Direct: 1 mg/dL — ABNORMAL HIGH (ref 0.00–0.20)
Bilirubin,Total: 1.2 mg/dL — ABNORMAL HIGH (ref 0.2–1.0)
SGOT(AST): 43 U/L — ABNORMAL HIGH (ref 15–37)
SGPT (ALT): 58 U/L (ref 12–78)
Total Protein: 4.6 g/dL — ABNORMAL LOW (ref 6.4–8.2)

## 2013-08-02 LAB — MAGNESIUM: MAGNESIUM: 1.6 mg/dL — AB

## 2013-08-02 LAB — TRIGLYCERIDES: TRIGLYCERIDES: 257 mg/dL — AB (ref 0–200)

## 2013-08-02 LAB — RETICULOCYTES
Absolute Retic Count: 0.0523 10*6/uL (ref 0.019–0.186)
Reticulocyte: 2.07 % (ref 0.4–3.1)

## 2013-08-02 LAB — LACTATE DEHYDROGENASE: LDH: 307 U/L — ABNORMAL HIGH (ref 85–241)

## 2013-08-02 LAB — PHOSPHORUS: PHOSPHORUS: 1.9 mg/dL — AB (ref 2.5–4.9)

## 2013-08-03 DIAGNOSIS — I259 Chronic ischemic heart disease, unspecified: Secondary | ICD-10-CM

## 2013-08-03 LAB — BASIC METABOLIC PANEL
ANION GAP: 1 — AB (ref 7–16)
Anion Gap: 5 — ABNORMAL LOW (ref 7–16)
Anion Gap: 3 — ABNORMAL LOW (ref 7–16)
Anion Gap: 2 — ABNORMAL LOW (ref 7–16)
BUN: 31 mg/dL — ABNORMAL HIGH (ref 7–18)
BUN: 32 mg/dL — ABNORMAL HIGH (ref 7–18)
BUN: 32 mg/dL — AB (ref 7–18)
BUN: 32 mg/dL — AB (ref 7–18)
CALCIUM: 8.4 mg/dL — AB (ref 8.5–10.1)
CHLORIDE: 105 mmol/L (ref 98–107)
CHLORIDE: 105 mmol/L (ref 98–107)
CHLORIDE: 105 mmol/L (ref 98–107)
CREATININE: 1.42 mg/dL — AB (ref 0.60–1.30)
CREATININE: 1.38 mg/dL — AB (ref 0.60–1.30)
Calcium, Total: 8.3 mg/dL — ABNORMAL LOW (ref 8.5–10.1)
Calcium, Total: 8.3 mg/dL — ABNORMAL LOW (ref 8.5–10.1)
Calcium, Total: 8.2 mg/dL — ABNORMAL LOW (ref 8.5–10.1)
Chloride: 106 mmol/L (ref 98–107)
Co2: 29 mmol/L (ref 21–32)
Co2: 29 mmol/L (ref 21–32)
Co2: 31 mmol/L (ref 21–32)
Co2: 30 mmol/L (ref 21–32)
Creatinine: 1.52 mg/dL — ABNORMAL HIGH (ref 0.60–1.30)
Creatinine: 1.45 mg/dL — ABNORMAL HIGH (ref 0.60–1.30)
EGFR (African American): 56 — ABNORMAL LOW
EGFR (African American): 58 — ABNORMAL LOW
EGFR (African American): 60 — ABNORMAL LOW
EGFR (Non-African Amer.): 46 — ABNORMAL LOW
EGFR (Non-African Amer.): 50 — ABNORMAL LOW
EGFR (Non-African Amer.): 51 — ABNORMAL LOW
GFR CALC AF AMER: 53 — AB
GFR CALC NON AF AMER: 48 — AB
GLUCOSE: 323 mg/dL — AB (ref 65–99)
Glucose: 252 mg/dL — ABNORMAL HIGH (ref 65–99)
Glucose: 167 mg/dL — ABNORMAL HIGH (ref 65–99)
Glucose: 165 mg/dL — ABNORMAL HIGH (ref 65–99)
Osmolality: 297 (ref 275–301)
Osmolality: 289 (ref 275–301)
Osmolality: 286 (ref 275–301)
Osmolality: 284 (ref 275–301)
POTASSIUM: 4.5 mmol/L (ref 3.5–5.1)
Potassium: 4.8 mmol/L (ref 3.5–5.1)
Potassium: 4.6 mmol/L (ref 3.5–5.1)
Potassium: 4.6 mmol/L (ref 3.5–5.1)
SODIUM: 139 mmol/L (ref 136–145)
SODIUM: 137 mmol/L (ref 136–145)
Sodium: 137 mmol/L (ref 136–145)
Sodium: 138 mmol/L (ref 136–145)

## 2013-08-03 LAB — CBC WITH DIFFERENTIAL/PLATELET
BANDS NEUTROPHIL: 1 %
Comment - H1-Com2: NORMAL
HCT: 21.9 % — AB (ref 40.0–52.0)
HGB: 7.2 g/dL — AB (ref 13.0–18.0)
Lymphocytes: 5 %
MCH: 30.2 pg (ref 26.0–34.0)
MCHC: 33 g/dL (ref 32.0–36.0)
MCV: 92 fL (ref 80–100)
Metamyelocyte: 1 %
Monocytes: 7 %
NRBC/100 WBC: 1 /
PLATELETS: 53 10*3/uL — AB (ref 150–440)
RBC: 2.39 10*6/uL — ABNORMAL LOW (ref 4.40–5.90)
RDW: 15.8 % — ABNORMAL HIGH (ref 11.5–14.5)
Segmented Neutrophils: 86 %
WBC: 14.2 10*3/uL — ABNORMAL HIGH (ref 3.8–10.6)

## 2013-08-04 LAB — BASIC METABOLIC PANEL
Anion Gap: 2 — ABNORMAL LOW (ref 7–16)
Anion Gap: 5 — ABNORMAL LOW (ref 7–16)
BUN: 31 mg/dL — AB (ref 7–18)
BUN: 38 mg/dL — ABNORMAL HIGH (ref 7–18)
CHLORIDE: 105 mmol/L (ref 98–107)
CO2: 29 mmol/L (ref 21–32)
CO2: 29 mmol/L (ref 21–32)
CREATININE: 1.35 mg/dL — AB (ref 0.60–1.30)
Calcium, Total: 8.3 mg/dL — ABNORMAL LOW (ref 8.5–10.1)
Calcium, Total: 8.2 mg/dL — ABNORMAL LOW (ref 8.5–10.1)
Chloride: 104 mmol/L (ref 98–107)
Creatinine: 1.47 mg/dL — ABNORMAL HIGH (ref 0.60–1.30)
EGFR (African American): 55 — ABNORMAL LOW
EGFR (Non-African Amer.): 48 — ABNORMAL LOW
GFR CALC NON AF AMER: 53 — AB
GLUCOSE: 197 mg/dL — AB (ref 65–99)
Glucose: 140 mg/dL — ABNORMAL HIGH (ref 65–99)
OSMOLALITY: 281 (ref 275–301)
OSMOLALITY: 290 (ref 275–301)
Potassium: 4.4 mmol/L (ref 3.5–5.1)
Potassium: 4.7 mmol/L (ref 3.5–5.1)
SODIUM: 136 mmol/L (ref 136–145)
SODIUM: 138 mmol/L (ref 136–145)

## 2013-08-04 LAB — CBC WITH DIFFERENTIAL/PLATELET
Basophil #: 0 10*3/uL (ref 0.0–0.1)
Basophil %: 0.2 %
EOS ABS: 0 10*3/uL (ref 0.0–0.7)
Eosinophil %: 0.1 %
HCT: 21.5 % — AB (ref 40.0–52.0)
HGB: 7 g/dL — ABNORMAL LOW (ref 13.0–18.0)
LYMPHS ABS: 0.7 10*3/uL — AB (ref 1.0–3.6)
Lymphocyte %: 5.1 %
MCH: 29.8 pg (ref 26.0–34.0)
MCHC: 32.6 g/dL (ref 32.0–36.0)
MCV: 92 fL (ref 80–100)
MONO ABS: 0.7 x10 3/mm (ref 0.2–1.0)
Monocyte %: 5.2 %
NEUTROS ABS: 12.7 10*3/uL — AB (ref 1.4–6.5)
NEUTROS PCT: 89.4 %
PLATELETS: 86 10*3/uL — AB (ref 150–440)
RBC: 2.34 10*6/uL — ABNORMAL LOW (ref 4.40–5.90)
RDW: 15.8 % — ABNORMAL HIGH (ref 11.5–14.5)
WBC: 14.2 10*3/uL — AB (ref 3.8–10.6)

## 2013-08-04 LAB — COMPREHENSIVE METABOLIC PANEL
ALT: 41 U/L (ref 12–78)
ANION GAP: 4 — AB (ref 7–16)
Albumin: 1.3 g/dL — ABNORMAL LOW (ref 3.4–5.0)
Alkaline Phosphatase: 95 U/L
BILIRUBIN TOTAL: 1.4 mg/dL — AB (ref 0.2–1.0)
BUN: 32 mg/dL — AB (ref 7–18)
Calcium, Total: 8.4 mg/dL — ABNORMAL LOW (ref 8.5–10.1)
Chloride: 105 mmol/L (ref 98–107)
Co2: 30 mmol/L (ref 21–32)
Creatinine: 1.29 mg/dL (ref 0.60–1.30)
GFR CALC NON AF AMER: 56 — AB
Glucose: 164 mg/dL — ABNORMAL HIGH (ref 65–99)
OSMOLALITY: 288 (ref 275–301)
Potassium: 4.5 mmol/L (ref 3.5–5.1)
SGOT(AST): 35 U/L (ref 15–37)
Sodium: 139 mmol/L (ref 136–145)
Total Protein: 4.9 g/dL — ABNORMAL LOW (ref 6.4–8.2)

## 2013-08-04 LAB — PHOSPHORUS: Phosphorus: 1.8 mg/dL — ABNORMAL LOW (ref 2.5–4.9)

## 2013-08-04 LAB — MAGNESIUM: Magnesium: 1.8 mg/dL

## 2013-08-05 LAB — CBC WITH DIFFERENTIAL/PLATELET
Basophil #: 0 10*3/uL (ref 0.0–0.1)
Basophil %: 0.3 %
EOS ABS: 0 10*3/uL (ref 0.0–0.7)
Eosinophil %: 0 %
HCT: 23.5 % — ABNORMAL LOW (ref 40.0–52.0)
HGB: 7.7 g/dL — AB (ref 13.0–18.0)
LYMPHS ABS: 0.9 10*3/uL — AB (ref 1.0–3.6)
Lymphocyte %: 5.8 %
MCH: 30.4 pg (ref 26.0–34.0)
MCHC: 33 g/dL (ref 32.0–36.0)
MCV: 92 fL (ref 80–100)
MONO ABS: 0.8 x10 3/mm (ref 0.2–1.0)
Monocyte %: 5.2 %
NEUTROS ABS: 13.6 10*3/uL — AB (ref 1.4–6.5)
NEUTROS PCT: 88.7 %
PLATELETS: 120 10*3/uL — AB (ref 150–440)
RBC: 2.55 10*6/uL — ABNORMAL LOW (ref 4.40–5.90)
RDW: 15.2 % — ABNORMAL HIGH (ref 11.5–14.5)
WBC: 15.3 10*3/uL — AB (ref 3.8–10.6)

## 2013-08-05 LAB — BASIC METABOLIC PANEL
Anion Gap: 5 — ABNORMAL LOW (ref 7–16)
BUN: 49 mg/dL — AB (ref 7–18)
CREATININE: 1.95 mg/dL — AB (ref 0.60–1.30)
Calcium, Total: 8.2 mg/dL — ABNORMAL LOW (ref 8.5–10.1)
Chloride: 102 mmol/L (ref 98–107)
Co2: 28 mmol/L (ref 21–32)
EGFR (African American): 39 — ABNORMAL LOW
EGFR (Non-African Amer.): 34 — ABNORMAL LOW
Glucose: 144 mg/dL — ABNORMAL HIGH (ref 65–99)
Osmolality: 286 (ref 275–301)
POTASSIUM: 4.8 mmol/L (ref 3.5–5.1)
Sodium: 135 mmol/L — ABNORMAL LOW (ref 136–145)

## 2013-08-05 LAB — IRON AND TIBC
IRON SATURATION: 29 %
Iron Bind.Cap.(Total): 207 ug/dL — ABNORMAL LOW (ref 250–450)
Iron: 61 ug/dL — ABNORMAL LOW (ref 65–175)
Unbound Iron-Bind.Cap.: 146 ug/dL

## 2013-08-05 LAB — PHOSPHORUS: Phosphorus: 2.1 mg/dL — ABNORMAL LOW (ref 2.5–4.9)

## 2013-08-05 LAB — MAGNESIUM: Magnesium: 1.7 mg/dL — ABNORMAL LOW

## 2013-08-06 ENCOUNTER — Ambulatory Visit (HOSPITAL_COMMUNITY)
Admission: AD | Admit: 2013-08-06 | Discharge: 2013-08-06 | Disposition: A | Discharge status: Home / Self Care | Payer: Medicare Other | Source: Other Acute Inpatient Hospital | Attending: Internal Medicine | Admitting: Internal Medicine

## 2013-08-06 ENCOUNTER — Inpatient Hospital Stay
Admission: AD | Admit: 2013-08-06 | Discharge: 2013-08-18 | Payer: Medicare Other | Source: Ambulatory Visit | Attending: Internal Medicine | Admitting: Internal Medicine

## 2013-08-06 DIAGNOSIS — J189 Pneumonia, unspecified organism: Secondary | ICD-10-CM

## 2013-08-06 DIAGNOSIS — D62 Acute posthemorrhagic anemia: Secondary | ICD-10-CM

## 2013-08-06 DIAGNOSIS — I259 Chronic ischemic heart disease, unspecified: Secondary | ICD-10-CM | POA: Diagnosis not present

## 2013-08-06 DIAGNOSIS — J9620 Acute and chronic respiratory failure, unspecified whether with hypoxia or hypercapnia: Secondary | ICD-10-CM

## 2013-08-06 DIAGNOSIS — J811 Chronic pulmonary edema: Secondary | ICD-10-CM | POA: Diagnosis not present

## 2013-08-06 DIAGNOSIS — K929 Disease of digestive system, unspecified: Secondary | ICD-10-CM | POA: Diagnosis not present

## 2013-08-06 DIAGNOSIS — K55059 Acute (reversible) ischemia of intestine, part and extent unspecified: Secondary | ICD-10-CM | POA: Diagnosis not present

## 2013-08-06 DIAGNOSIS — A419 Sepsis, unspecified organism: Secondary | ICD-10-CM | POA: Diagnosis not present

## 2013-08-06 DIAGNOSIS — I4891 Unspecified atrial fibrillation: Secondary | ICD-10-CM | POA: Diagnosis present

## 2013-08-06 DIAGNOSIS — N170 Acute kidney failure with tubular necrosis: Secondary | ICD-10-CM | POA: Diagnosis not present

## 2013-08-06 DIAGNOSIS — K631 Perforation of intestine (nontraumatic): Secondary | ICD-10-CM | POA: Diagnosis not present

## 2013-08-06 DIAGNOSIS — K650 Generalized (acute) peritonitis: Secondary | ICD-10-CM | POA: Diagnosis not present

## 2013-08-06 DIAGNOSIS — S298XXA Other specified injuries of thorax, initial encounter: Secondary | ICD-10-CM | POA: Diagnosis not present

## 2013-08-06 DIAGNOSIS — R141 Gas pain: Secondary | ICD-10-CM | POA: Diagnosis not present

## 2013-08-06 DIAGNOSIS — Z930 Tracheostomy status: Secondary | ICD-10-CM

## 2013-08-06 DIAGNOSIS — N186 End stage renal disease: Secondary | ICD-10-CM | POA: Diagnosis present

## 2013-08-06 DIAGNOSIS — R142 Eructation: Secondary | ICD-10-CM | POA: Diagnosis not present

## 2013-08-06 DIAGNOSIS — N179 Acute kidney failure, unspecified: Secondary | ICD-10-CM

## 2013-08-06 DIAGNOSIS — R1930 Abdominal rigidity, unspecified site: Secondary | ICD-10-CM | POA: Diagnosis not present

## 2013-08-06 DIAGNOSIS — K55029 Acute infarction of small intestine, extent unspecified: Secondary | ICD-10-CM

## 2013-08-06 DIAGNOSIS — K659 Peritonitis, unspecified: Secondary | ICD-10-CM | POA: Diagnosis not present

## 2013-08-06 DIAGNOSIS — R69 Illness, unspecified: Secondary | ICD-10-CM | POA: Diagnosis not present

## 2013-08-06 DIAGNOSIS — I2510 Atherosclerotic heart disease of native coronary artery without angina pectoris: Secondary | ICD-10-CM | POA: Diagnosis present

## 2013-08-06 DIAGNOSIS — R6520 Severe sepsis without septic shock: Secondary | ICD-10-CM | POA: Diagnosis not present

## 2013-08-06 DIAGNOSIS — I480 Paroxysmal atrial fibrillation: Secondary | ICD-10-CM

## 2013-08-06 DIAGNOSIS — R609 Edema, unspecified: Secondary | ICD-10-CM | POA: Diagnosis not present

## 2013-08-06 DIAGNOSIS — R1310 Dysphagia, unspecified: Secondary | ICD-10-CM | POA: Diagnosis present

## 2013-08-06 DIAGNOSIS — K56609 Unspecified intestinal obstruction, unspecified as to partial versus complete obstruction: Secondary | ICD-10-CM | POA: Insufficient documentation

## 2013-08-06 DIAGNOSIS — K658 Other peritonitis: Secondary | ICD-10-CM | POA: Diagnosis not present

## 2013-08-06 DIAGNOSIS — R935 Abnormal findings on diagnostic imaging of other abdominal regions, including retroperitoneum: Secondary | ICD-10-CM | POA: Diagnosis not present

## 2013-08-06 DIAGNOSIS — E46 Unspecified protein-calorie malnutrition: Secondary | ICD-10-CM | POA: Diagnosis not present

## 2013-08-06 DIAGNOSIS — G9341 Metabolic encephalopathy: Secondary | ICD-10-CM | POA: Diagnosis not present

## 2013-08-06 DIAGNOSIS — Z9911 Dependence on respirator [ventilator] status: Secondary | ICD-10-CM | POA: Insufficient documentation

## 2013-08-06 DIAGNOSIS — E139 Other specified diabetes mellitus without complications: Secondary | ICD-10-CM | POA: Diagnosis present

## 2013-08-06 DIAGNOSIS — J95821 Acute postprocedural respiratory failure: Secondary | ICD-10-CM | POA: Diagnosis not present

## 2013-08-06 DIAGNOSIS — J9600 Acute respiratory failure, unspecified whether with hypoxia or hypercapnia: Secondary | ICD-10-CM | POA: Diagnosis present

## 2013-08-06 DIAGNOSIS — J9589 Other postprocedural complications and disorders of respiratory system, not elsewhere classified: Secondary | ICD-10-CM | POA: Diagnosis not present

## 2013-08-06 DIAGNOSIS — R5381 Other malaise: Secondary | ICD-10-CM | POA: Diagnosis present

## 2013-08-06 DIAGNOSIS — Z4682 Encounter for fitting and adjustment of non-vascular catheter: Secondary | ICD-10-CM | POA: Diagnosis not present

## 2013-08-06 DIAGNOSIS — E876 Hypokalemia: Secondary | ICD-10-CM | POA: Diagnosis present

## 2013-08-06 DIAGNOSIS — Z452 Encounter for adjustment and management of vascular access device: Secondary | ICD-10-CM | POA: Diagnosis not present

## 2013-08-06 DIAGNOSIS — E43 Unspecified severe protein-calorie malnutrition: Secondary | ICD-10-CM | POA: Diagnosis not present

## 2013-08-06 DIAGNOSIS — J90 Pleural effusion, not elsewhere classified: Secondary | ICD-10-CM | POA: Diagnosis not present

## 2013-08-06 DIAGNOSIS — D500 Iron deficiency anemia secondary to blood loss (chronic): Secondary | ICD-10-CM | POA: Diagnosis not present

## 2013-08-06 DIAGNOSIS — R739 Hyperglycemia, unspecified: Secondary | ICD-10-CM

## 2013-08-06 DIAGNOSIS — J9819 Other pulmonary collapse: Secondary | ICD-10-CM | POA: Diagnosis not present

## 2013-08-06 DIAGNOSIS — D649 Anemia, unspecified: Secondary | ICD-10-CM | POA: Diagnosis not present

## 2013-08-06 DIAGNOSIS — K559 Vascular disorder of intestine, unspecified: Secondary | ICD-10-CM | POA: Diagnosis not present

## 2013-08-06 LAB — CBC WITH DIFFERENTIAL/PLATELET
BASOS ABS: 0 10*3/uL (ref 0.0–0.1)
Basophil %: 0.2 %
Eosinophil #: 0 10*3/uL (ref 0.0–0.7)
Eosinophil %: 0.3 %
HCT: 21.5 % — ABNORMAL LOW (ref 40.0–52.0)
HGB: 7 g/dL — AB (ref 13.0–18.0)
Lymphocyte #: 1.3 10*3/uL (ref 1.0–3.6)
Lymphocyte %: 7.6 %
MCH: 29.8 pg (ref 26.0–34.0)
MCHC: 32.5 g/dL (ref 32.0–36.0)
MCV: 92 fL (ref 80–100)
MONOS PCT: 9.9 %
Monocyte #: 1.7 x10 3/mm — ABNORMAL HIGH (ref 0.2–1.0)
NEUTROS PCT: 82 %
Neutrophil #: 13.7 10*3/uL — ABNORMAL HIGH (ref 1.4–6.5)
PLATELETS: 196 10*3/uL (ref 150–440)
RBC: 2.35 10*6/uL — ABNORMAL LOW (ref 4.40–5.90)
RDW: 14.9 % — AB (ref 11.5–14.5)
WBC: 16.7 10*3/uL — AB (ref 3.8–10.6)

## 2013-08-06 LAB — BASIC METABOLIC PANEL
ANION GAP: 8 (ref 7–16)
BUN: 91 mg/dL — ABNORMAL HIGH (ref 7–18)
CALCIUM: 7.6 mg/dL — AB (ref 8.5–10.1)
Chloride: 100 mmol/L (ref 98–107)
Co2: 24 mmol/L (ref 21–32)
Creatinine: 3.23 mg/dL — ABNORMAL HIGH (ref 0.60–1.30)
EGFR (African American): 21 — ABNORMAL LOW
EGFR (Non-African Amer.): 18 — ABNORMAL LOW
Glucose: 119 mg/dL — ABNORMAL HIGH (ref 65–99)
Osmolality: 294 (ref 275–301)
POTASSIUM: 5.2 mmol/L — AB (ref 3.5–5.1)
SODIUM: 132 mmol/L — AB (ref 136–145)

## 2013-08-06 LAB — BLOOD GAS, ARTERIAL
Acid-Base Excess: 2.7 mmol/L — ABNORMAL HIGH (ref 0.0–2.0)
BICARBONATE: 27.1 meq/L — AB (ref 20.0–24.0)
FIO2: 0.4 %
LHR: 30 {breaths}/min
MECHVT: 450 mL
O2 Saturation: 97.1 %
PCO2 ART: 44.6 mmHg (ref 35.0–45.0)
PEEP/CPAP: 5 cmH2O
Patient temperature: 98.6
TCO2: 28.4 mmol/L (ref 0–100)
pH, Arterial: 7.4 (ref 7.350–7.450)
pO2, Arterial: 96 mmHg (ref 80.0–100.0)

## 2013-08-06 LAB — PHOSPHORUS
PHOSPHORUS: 3.3 mg/dL (ref 2.5–4.9)
Phosphorus: 3.9 mg/dL (ref 2.5–4.9)

## 2013-08-06 LAB — MAGNESIUM: Magnesium: 2.2 mg/dL

## 2013-08-07 ENCOUNTER — Other Ambulatory Visit (HOSPITAL_COMMUNITY): Payer: Self-pay

## 2013-08-07 DIAGNOSIS — N179 Acute kidney failure, unspecified: Secondary | ICD-10-CM

## 2013-08-07 DIAGNOSIS — J9819 Other pulmonary collapse: Secondary | ICD-10-CM | POA: Diagnosis not present

## 2013-08-07 DIAGNOSIS — I480 Paroxysmal atrial fibrillation: Secondary | ICD-10-CM

## 2013-08-07 DIAGNOSIS — K55059 Acute (reversible) ischemia of intestine, part and extent unspecified: Secondary | ICD-10-CM | POA: Insufficient documentation

## 2013-08-07 DIAGNOSIS — D62 Acute posthemorrhagic anemia: Secondary | ICD-10-CM

## 2013-08-07 DIAGNOSIS — J9600 Acute respiratory failure, unspecified whether with hypoxia or hypercapnia: Secondary | ICD-10-CM | POA: Diagnosis not present

## 2013-08-07 DIAGNOSIS — R935 Abnormal findings on diagnostic imaging of other abdominal regions, including retroperitoneum: Secondary | ICD-10-CM | POA: Diagnosis not present

## 2013-08-07 DIAGNOSIS — Z452 Encounter for adjustment and management of vascular access device: Secondary | ICD-10-CM | POA: Diagnosis not present

## 2013-08-07 DIAGNOSIS — I4891 Unspecified atrial fibrillation: Secondary | ICD-10-CM

## 2013-08-07 DIAGNOSIS — Z4682 Encounter for fitting and adjustment of non-vascular catheter: Secondary | ICD-10-CM | POA: Diagnosis not present

## 2013-08-07 DIAGNOSIS — R7309 Other abnormal glucose: Secondary | ICD-10-CM

## 2013-08-07 DIAGNOSIS — R141 Gas pain: Secondary | ICD-10-CM | POA: Diagnosis not present

## 2013-08-07 DIAGNOSIS — K55029 Acute infarction of small intestine, extent unspecified: Secondary | ICD-10-CM

## 2013-08-07 DIAGNOSIS — J95821 Acute postprocedural respiratory failure: Secondary | ICD-10-CM

## 2013-08-07 DIAGNOSIS — J90 Pleural effusion, not elsewhere classified: Secondary | ICD-10-CM | POA: Diagnosis not present

## 2013-08-07 DIAGNOSIS — R739 Hyperglycemia, unspecified: Secondary | ICD-10-CM

## 2013-08-07 LAB — COMPREHENSIVE METABOLIC PANEL
ALK PHOS: 79 U/L (ref 39–117)
ALT: 32 U/L (ref 0–53)
ALT: 32 U/L (ref 0–53)
AST: 23 U/L (ref 0–37)
AST: 27 U/L (ref 0–37)
Albumin: 1.3 g/dL — ABNORMAL LOW (ref 3.5–5.2)
Albumin: 1.4 g/dL — ABNORMAL LOW (ref 3.5–5.2)
Alkaline Phosphatase: 90 U/L (ref 39–117)
BUN: 71 mg/dL — ABNORMAL HIGH (ref 6–23)
BUN: 84 mg/dL — ABNORMAL HIGH (ref 6–23)
CHLORIDE: 99 meq/L (ref 96–112)
CO2: 24 meq/L (ref 19–32)
CO2: 22 mEq/L (ref 19–32)
Calcium: 7.1 mg/dL — ABNORMAL LOW (ref 8.4–10.5)
Calcium: 7.1 mg/dL — ABNORMAL LOW (ref 8.4–10.5)
Chloride: 99 mEq/L (ref 96–112)
Creatinine, Ser: 2.62 mg/dL — ABNORMAL HIGH (ref 0.50–1.35)
Creatinine, Ser: 3.1 mg/dL — ABNORMAL HIGH (ref 0.50–1.35)
GFR calc Af Amer: 27 mL/min — ABNORMAL LOW (ref >90)
GFR calc non Af Amer: 19 mL/min — ABNORMAL LOW (ref >90)
GFR, EST AFRICAN AMERICAN: 22 mL/min — AB (ref >90)
GFR, EST NON AFRICAN AMERICAN: 23 mL/min — AB (ref >90)
GLUCOSE: 110 mg/dL — AB (ref 70–99)
Glucose, Bld: 197 mg/dL — ABNORMAL HIGH (ref 70–99)
POTASSIUM: 5.1 meq/L (ref 3.7–5.3)
POTASSIUM: 6 meq/L — AB (ref 3.7–5.3)
SODIUM: 138 meq/L (ref 137–147)
Sodium: 138 mEq/L (ref 137–147)
TOTAL PROTEIN: 5 g/dL — AB (ref 6.0–8.3)
Total Bilirubin: 0.9 mg/dL (ref 0.3–1.2)
Total Bilirubin: 1 mg/dL (ref 0.3–1.2)
Total Protein: 4.4 g/dL — ABNORMAL LOW (ref 6.0–8.3)

## 2013-08-07 LAB — PROCALCITONIN: Procalcitonin: 1.6 ng/mL

## 2013-08-07 LAB — PROTIME-INR
INR: 1.24 (ref 0.00–1.49)
Prothrombin Time: 15.3 seconds — ABNORMAL HIGH (ref 11.6–15.2)

## 2013-08-07 LAB — EXPECTORATED SPUTUM ASSESSMENT W REFEX TO RESP CULTURE

## 2013-08-07 LAB — PREPARE RBC (CROSSMATCH)

## 2013-08-07 LAB — PREALBUMIN: Prealbumin: 20.7 mg/dL (ref 17.0–34.0)

## 2013-08-07 LAB — ABO/RH: ABO/RH(D): A POS

## 2013-08-07 LAB — CBC
HCT: 17 % — ABNORMAL LOW (ref 39.0–52.0)
HEMOGLOBIN: 6 g/dL — AB (ref 13.0–17.0)
MCH: 31.7 pg (ref 26.0–34.0)
MCHC: 35.3 g/dL (ref 30.0–36.0)
MCV: 89.9 fL (ref 78.0–100.0)
PLATELETS: 238 10*3/uL (ref 150–400)
RBC: 1.89 MIL/uL — AB (ref 4.22–5.81)
RDW: 15.1 % (ref 11.5–15.5)
WBC: 15.8 10*3/uL — AB (ref 4.0–10.5)

## 2013-08-07 LAB — OCCULT BLOOD X 1 CARD TO LAB, STOOL: Fecal Occult Bld: POSITIVE — AB

## 2013-08-07 LAB — TSH: TSH: 7.702 u[IU]/mL — ABNORMAL HIGH (ref 0.350–4.500)

## 2013-08-07 LAB — LACTIC ACID, PLASMA: Lactic Acid, Venous: 1.4 mmol/L (ref 0.5–2.2)

## 2013-08-07 NOTE — Consult Note (Signed)
Name: Jason Harmon No MRN: [id number] DOB: 23-Jul-1940    ADMISSION DATE:  08/06/2013 CONSULTATION DATE:  1-21  REFERRING MD : [facility] PRIMARY SERVICE: [facility]  CHIEF COMPLAINT:  VDRF  BRIEF PATIENT DESCRIPTION:  73 yo wm with no significant PMH who presented to [facility] 1-9 with N/V and found to have SBO and underwent resection. ACR, CVVHD, gross overlaod  SIGNIFICANT EVENTS / STUDIES:    LINES / TUBES: 1-9 ott>> 1-9 rt fem cvl>>1-21 1-12 rt i j hd cath>> 1-21 LIJ cvl>>  CULTURES:   ANTIBIOTICS: 1-20 pip-tazo>>  HISTORY OF PRESENT ILLNESS:   73 yo wm with no significant PMH who presented to [facility] 1-9 with N/V and found to have SBO and underwent resection. He required intubation and subsequent treatment for renal failure. He failed extubation attempts and was transferred to ssh 08-06-13. pccm asked to consult 1-21.   PAST MEDICAL HISTORY :  :   Acute respiratory failure following trauma and surgery   Atrial fibrillation   Ischemic necrosis of small bowel   Hyperglycemia   Acute renal failure   Acute blood loss anemia   Prior to Admission medications   reviewed   All: None  FAMILY HISTORY:  Reviewed SOCIAL HISTORY: No etoh or tobacco  REVIEW OF SYSTEMS:  NA  SUBJECTIVE:   VITAL SIGNS: Vital signs reviewed. Abnormal values will appear under impression plan section.  HEMODYNAMICS:   VENTILATOR SETTINGS: fio2 40% AC/VC mode Rate 30/0 Vt 450 PIP 34 plt 14 INTAKE / OUTPUT: Intake/Output   None     PHYSICAL EXAMINATION: General: Frail wm sedated on vent Neuro:  Sedated on vent HEENT:  Ott-> vent, no jvd/lan Cardiovascular:  hsr rrr Lungs:  Coarse rhonchi and bibasilar rales Abdomen:  Midline incision well approximated Musculoskeletal:  INTACT Skin:  WARM, MASSIVE SCROTAL EDEMA  LABS:  CBC  Recent Labs Lab 08/07/13 0800  WBC 15.8*  HGB 6.0*  HCT 17.0*  PLT 238   Coag's No results found for this basename: APTT, INR,  in the last 168  hours BMET  Recent Labs Lab 08/07/13 0800  NA 138  K 5.1  CL 99  CO2 24  BUN 71*  CREATININE 2.62*  GLUCOSE 110*   Electrolytes  Recent Labs Lab 08/07/13 0800  CALCIUM 7.1*   Sepsis Markers  Recent Labs Lab 08/07/13 0800  PROCALCITON 1.60   ABG  Recent Labs Lab 08/06/13 1800  PHART 7.400  PCO2ART 44.6  PO2ART 96.0   Liver Enzymes  Recent Labs Lab 08/07/13 0800  AST 23  ALT 32  ALKPHOS 79  BILITOT 0.9  ALBUMIN 1.3*   Cardiac Enzymes No results found for this basename: TROPONINI, PROBNP,  in the last 168 hours Glucose No results found for this basename: GLUCAP,  in the last 168 hours  Imaging Dg Chest Port 1 View  08/07/2013   CLINICAL DATA:  Respiratory failure  EXAM: PORTABLE CHEST - 1 VIEW  COMPARISON:  Portable chest x-ray of 05 August 2012  FINDINGS: The lungs remain borderline hypoinflated. The hemidiaphragms remain obscured. Confluent interstitial and early alveolar densities are present predominantly on the left and there is slightly more conspicuous. Pleural fluid collections are likely layering posteriorly. The cardiac silhouette is top-normal in size where visualized. The pulmonary vascularity is indistinct.  The endotracheal tube tip lies 5.1 cm above the crotch of the carina. The esophagogastric tube tip and proximal port project below the expected location of the left hemidiaphragm. The right internal jugular venous catheter  tip lies in the region of the proximal to mid SVC and appears unchanged.  IMPRESSION: 1. Since yesterday's study there has been some interval deterioration in the appearance of the pulmonary interstitium bilaterally. Densities in the left mid and lower lung are more confluent. These findings are consistent with worsening interstitial and alveolar edema. Bilateral pleural effusions are contributing to the densities as well. 2. There is reasonable positioning of the support tubes and lines.   Electronically Signed   By: David   [place]   On: 08/07/2013 07:51     CXR: see above  ASSESSMENT    Acute respiratory failure following trauma and surgery   Atrial fibrillation   Ischemic necrosis of small bowel   Hyperglycemia   Acute renal failure   Acute blood loss anemia Gross overload Edema plm effusions  Plan: Vent bundle with goal of liberation from vent Early trach planned for 1-22 Abx per SSH(neg cultures at [facility]) BD's as needed Volume reduction, consider daily HD abg  Rate control with currently sinus and off amio Volume reduction via HD Monitor cardiac per [facility]  Post bowel resection, per [facility]  Hyperglycemia per  [facility]  Renal failure with rt ij hd cath. Per ssh. Consider daily HD for volume reduction.  Anemia per [facility] tx 2 units 1-21  I have fully examined this patient and agree with above findings.    And edited infull  [name]. Titus Mould, MD, [place] Pgr: [place] [facility]  [name] Minor ACNP [facility] Shape PCCM Pager [phone] till 3 pm If no answer page [phone] 08/07/2013, 10:39 AM

## 2013-08-07 NOTE — Procedures (Signed)
Central Venous Catheter Insertion Procedure Note Jason Harmon [id number] [birth year]  Procedure: Insertion of Central Venous Catheter Indications: Assessment of intravascular volume, Drug and/or fluid administration and Frequent blood sampling  Procedure Details Consent: Risks of procedure as well as the alternatives and risks of each were explained to the (patient/caregiver).  Consent for procedure obtained. Time Out: Verified patient identification, verified procedure, site/side was marked, verified correct patient position, special equipment/implants available, medications/allergies/relevent history reviewed, required imaging and test results available.  Performed  Maximum sterile technique was used including antiseptics, cap, gloves, gown, hand hygiene, mask and sheet. Skin prep: Chlorhexidine; local anesthetic administered A antimicrobial bonded/coated triple lumen catheter was placed in the left internal jugular vein using the Seldinger technique. Ultrasound guidance used.yes Catheter placed to 20 cm. Blood aspirated via all 3 ports and then flushed x 3. Line sutured x 2 and dressing applied.  Evaluation Blood flow good Complications: No apparent complications Patient did tolerate procedure well. Chest X-ray ordered to verify placement.  CXR: pending.  Jason Harmon Minor ACNP [facility]anna Shape PCCM Pager [phone] till 3 pm If no answer page [phone] 08/07/2013, 11:35 AM   tlerated [place]  Jason Harmon J. Titus Mould, MD, Jason Harmon: [facility]

## 2013-08-08 ENCOUNTER — Other Ambulatory Visit (HOSPITAL_COMMUNITY): Payer: Self-pay

## 2013-08-08 ENCOUNTER — Encounter (HOSPITAL_COMMUNITY): Payer: Medicare Other

## 2013-08-08 LAB — COMPREHENSIVE METABOLIC PANEL
ALT: 30 U/L (ref 0–53)
AST: 26 U/L (ref 0–37)
Albumin: 1.8 g/dL — ABNORMAL LOW (ref 3.5–5.2)
Alkaline Phosphatase: 87 U/L (ref 39–117)
BILIRUBIN TOTAL: 0.9 mg/dL (ref 0.3–1.2)
BUN: 86 mg/dL — AB (ref 6–23)
CHLORIDE: 98 meq/L (ref 96–112)
CO2: 23 meq/L (ref 19–32)
Calcium: 7 mg/dL — ABNORMAL LOW (ref 8.4–10.5)
Creatinine, Ser: 3.39 mg/dL — ABNORMAL HIGH (ref 0.50–1.35)
GFR, EST AFRICAN AMERICAN: 20 mL/min — AB (ref >90)
GFR, EST NON AFRICAN AMERICAN: 17 mL/min — AB (ref >90)
GLUCOSE: 190 mg/dL — AB (ref 70–99)
POTASSIUM: 5 meq/L (ref 3.7–5.3)
SODIUM: 139 meq/L (ref 137–147)
Total Protein: 5 g/dL — ABNORMAL LOW (ref 6.0–8.3)

## 2013-08-08 LAB — CBC
HCT: 22.8 % — ABNORMAL LOW (ref 39.0–52.0)
Hemoglobin: 8.1 g/dL — ABNORMAL LOW (ref 13.0–17.0)
MCH: 31.6 pg (ref 26.0–34.0)
MCHC: 35.5 g/dL (ref 30.0–36.0)
MCV: 89.1 fL (ref 78.0–100.0)
PLATELETS: 308 10*3/uL (ref 150–400)
RBC: 2.56 MIL/uL — ABNORMAL LOW (ref 4.22–5.81)
RDW: 15.3 % (ref 11.5–15.5)
WBC: 23.1 10*3/uL — AB (ref 4.0–10.5)

## 2013-08-08 LAB — PROTIME-INR
INR: 1.21 (ref 0.00–1.49)
Prothrombin Time: 15 seconds (ref 11.6–15.2)

## 2013-08-08 LAB — APTT: APTT: 37 s (ref 24–37)

## 2013-08-09 ENCOUNTER — Encounter (HOSPITAL_COMMUNITY): Payer: Self-pay

## 2013-08-09 ENCOUNTER — Other Ambulatory Visit (HOSPITAL_COMMUNITY): Payer: Self-pay

## 2013-08-09 DIAGNOSIS — J9620 Acute and chronic respiratory failure, unspecified whether with hypoxia or hypercapnia: Secondary | ICD-10-CM

## 2013-08-09 DIAGNOSIS — J9600 Acute respiratory failure, unspecified whether with hypoxia or hypercapnia: Secondary | ICD-10-CM

## 2013-08-09 LAB — RENAL FUNCTION PANEL
ALBUMIN: 2.2 g/dL — AB (ref 3.5–5.2)
BUN: 102 mg/dL — AB (ref 6–23)
CHLORIDE: 99 meq/L (ref 96–112)
CO2: 18 mEq/L — ABNORMAL LOW (ref 19–32)
CREATININE: 4.13 mg/dL — AB (ref 0.50–1.35)
Calcium: 7.3 mg/dL — ABNORMAL LOW (ref 8.4–10.5)
GFR calc Af Amer: 15 mL/min — ABNORMAL LOW (ref >90)
GFR calc non Af Amer: 13 mL/min — ABNORMAL LOW (ref >90)
Glucose, Bld: 174 mg/dL — ABNORMAL HIGH (ref 70–99)
PHOSPHORUS: 7.3 mg/dL — AB (ref 2.3–4.6)
POTASSIUM: 5.5 meq/L — AB (ref 3.7–5.3)
Sodium: 139 mEq/L (ref 137–147)

## 2013-08-09 LAB — CBC
HCT: 21.7 % — ABNORMAL LOW (ref 39.0–52.0)
HEMOGLOBIN: 7.5 g/dL — AB (ref 13.0–17.0)
MCH: 30.7 pg (ref 26.0–34.0)
MCHC: 34.6 g/dL (ref 30.0–36.0)
MCV: 88.9 fL (ref 78.0–100.0)
PLATELETS: 373 10*3/uL (ref 150–400)
RBC: 2.44 MIL/uL — AB (ref 4.22–5.81)
RDW: 15.1 % (ref 11.5–15.5)
WBC: 17.7 10*3/uL — ABNORMAL HIGH (ref 4.0–10.5)

## 2013-08-09 LAB — TYPE AND SCREEN
ABO/RH(D): A POS
Antibody Screen: NEGATIVE
Unit division: 0
Unit division: 0

## 2013-08-09 LAB — BASIC METABOLIC PANEL
BUN: 111 mg/dL — AB (ref 6–23)
CALCIUM: 6.7 mg/dL — AB (ref 8.4–10.5)
CO2: 20 mEq/L (ref 19–32)
Chloride: 104 mEq/L (ref 96–112)
Creatinine, Ser: 4.41 mg/dL — ABNORMAL HIGH (ref 0.50–1.35)
GFR calc Af Amer: 14 mL/min — ABNORMAL LOW (ref >90)
GFR, EST NON AFRICAN AMERICAN: 12 mL/min — AB (ref >90)
Glucose, Bld: 215 mg/dL — ABNORMAL HIGH (ref 70–99)
Potassium: 5.3 mEq/L (ref 3.7–5.3)
Sodium: 143 mEq/L (ref 137–147)

## 2013-08-09 LAB — PRO B NATRIURETIC PEPTIDE: PRO B NATRI PEPTIDE: 6122 pg/mL — AB (ref 0–125)

## 2013-08-09 NOTE — Procedures (Signed)
Staff note  Supervised procedure  Dr. Brand Males, M.D., [facility].C.P Pulmonary and Critical Care Medicine Staff Physician [place] [facility] Pager: [phone], If no answer or between  15:00h - 7:00h: call [phone]  08/09/2013 11:41 AM

## 2013-08-09 NOTE — Procedures (Signed)
Perc trach cnsent family Pre op Dx: gross volume overload, edema, 2 weeks acute resp failure, ARF Post op s/p trach secondary to prolonged critical illness  Bronch Dr Corliss Skains Minor ACNP chloraprep to surgical site ett baCKED UP to 18 cm Lidocaine epi 7 cc injected to skin and sub q and soft tissue 2 its 1.2 cm vert incision made Dissection to stap muslces and tio trach planes Small thyroid tissue avoided above site Placed needle 18 gauge and white cath sheeth, then wire, then punch 14 fr dilator No post wall injury progtressive dil to 37 fr, then 8 trach with 28 fr dilator Tolerated well Bronch through new trach , well placed , no apparaent complication Blood loss less 2 cc  [name]. Titus Mould, MD, [place] Pgr: [place] [facility]

## 2013-08-09 NOTE — Consult Note (Signed)
Name: Jason Harmon MRN: [id number] DOB: July 25, 1940    ADMISSION DATE:  08/06/2013 CONSULTATION DATE:  1-21  REFERRING MD : [facility] PRIMARY SERVICE: [facility]  CHIEF COMPLAINT:  VDRF  BRIEF PATIENT DESCRIPTION:  73 yo wm with no significant PMH who presented to [facility] 1-9 with N/V and found to have SBO and underwent resection. ACR, CVVHD, gross overlaod  SIGNIFICANT EVENTS / STUDIES:  1-22 trached per DF  LINES / TUBES: 1-9 ott>>1-22 1-9 rt fem cvl>>1-21 1-12 rt i j hd cath>> 1-21 LIJ cvl>> 1-22 trach(DF)>> CULTURES:   ANTIBIOTICS: 1-20 pip-tazo>>  HISTORY OF PRESENT ILLNESS:   73 yo wm with no significant PMH who presented to [facility] 1-9 with N/V and found to have SBO and underwent resection. He required intubation and subsequent treatment for renal failure. He failed extubation attempts and was transferred to ssh 08-06-13. pccm asked to consult 1-21.  SUBJECTIVE:  NAD, off pressors VITAL SIGNS: Vital signs reviewed. Abnormal values will appear under impression plan section.  HEMODYNAMICS:   VENTILATOR SETTINGS: fio2 40% AC/VC mode Rate 30/0 Vt 450 PIP 34 plt 14 INTAKE / OUTPUT: Intake/Output   None     PHYSICAL EXAMINATION: General: Frail wm sedated on vent Neuro:  Sedated on vent HEENT:  Trach-> vent, no jvd/lan Cardiovascular:  hsr rrr Lungs:  Coarse rhonchi and bibasilar rales Abdomen:  Midline incision well approximated Musculoskeletal:  INTACT Skin:  WARM, MASSIVE SCROTAL EDEMA  LABS:  CBC  Recent Labs Lab 08/07/13 0800 08/08/13 0845 08/09/13 0637  WBC 15.8* 23.1* 17.7*  HGB 6.0* 8.1* 7.5*  HCT 17.0* 22.8* 21.7*  PLT 238 308 373   Coag's  Recent Labs Lab 08/07/13 2259 08/08/13 0501  APTT  --  37  INR 1.24 1.21   BMET  Recent Labs Lab 08/07/13 0800 08/07/13 2200 08/08/13 0501  NA 138 138 139  K 5.1 6.0* 5.0  CL 99 99 98  CO2 24 22 23   BUN 71* 84* 86*  CREATININE 2.62* 3.10* 3.39*  GLUCOSE 110* 197* 190*    Electrolytes  Recent Labs Lab 08/07/13 0800 08/07/13 2200 08/08/13 0501  CALCIUM 7.1* 7.1* 7.0*   Sepsis Markers  Recent Labs Lab 08/07/13 0800 08/07/13 2200  LATICACIDVEN  --  1.4  PROCALCITON 1.60  --    ABG  Recent Labs Lab 08/06/13 1800  PHART 7.400  PCO2ART 44.6  PO2ART 96.0   Liver Enzymes  Recent Labs Lab 08/07/13 0800 08/07/13 2200 08/08/13 0501  AST 23 27 26   ALT 32 32 30  ALKPHOS 79 90 87  BILITOT 0.9 1.0 0.9  ALBUMIN 1.3* 1.4* 1.8*   Cardiac Enzymes  Recent Labs Lab 08/09/13 0637  PROBNP 6122.0*   Glucose No results found for this basename: GLUCAP,  in the last 168 hours  Imaging Ct Abdomen Pelvis Wo Contrast  08/08/2013   CLINICAL DATA:  Small bowel obstruction with partial small bowel resection for ischemic bowel. Subsequent acute respiratory failure and renal failure.  EXAM: CT ABDOMEN AND PELVIS WITHOUT CONTRAST  TECHNIQUE: Multidetector CT imaging of the abdomen and pelvis was performed following the standard protocol without intravenous contrast.  COMPARISON:  Abdominal pelvic CT 07/26/2013.  FINDINGS: Examination is mildly motion degraded and degraded by the patient's arms. Anasarca has developed with diffuse soft tissue edema. There are enlarging bilateral pleural effusions with associated bibasilar pulmonary atelectasis.  A nasogastric tube projects into the mid stomach. The stomach and small bowel are decompressed. Only minimal enteric contrast was administered. There  is no extravasated contrast or focal extraluminal fluid collection. A small amount of free fluid is present within both pericolic gutters and in the pelvis. Anastomosis clips are present within the left lower quadrant. There is no evidence of bowel obstruction, pneumatosis or free intraperitoneal air.  As evaluated in the noncontrast state, the liver, spleen, pancreas and adrenal glands appear normal. There is high density within the gallbladder lumen, likely vicarious  excretion of contrast. There has been mild interval enlargement of both kidneys, likely related to the patient's acute renal failure. There is no hydronephrosis. Small exophytic lesion projecting anteriorly from the mid right kidney is grossly stable.  There is no significant atherosclerosis. The bladder is partially decompressed by Foley catheter. Right femoral line extends into the external iliac vein. There is no retroperitoneal hematoma.  There are no worrisome osseous findings.  IMPRESSION: 1. Postsurgical changes following partial small bowel resection and anastomosis. No residual bowel obstruction, free intraperitoneal air or focal extraluminal fluid collection. 2. Anasarca with diffuse soft tissue edema, pleural effusions and ascites. 3. Interval enlargement of both kidneys attributed to acute renal failure. No hydronephrosis.   Electronically Signed   By: Camie Patience M.D.   On: 08/08/2013 15:18   Dg Chest Port 1 View  08/09/2013   CLINICAL DATA:  Post tracheostomy tube placement  EXAM: PORTABLE CHEST - 1 VIEW  COMPARISON:  08/08/2013; 08/07/2013; 08/05/2013  FINDINGS: Grossly unchanged cardiac silhouette and mediastinal contours. Interval placement of tracheostomy tube with minimal amount of subcutaneous emphysema within the left supraclavicular fossa. No definite pneumothorax. The pulmonary vasculature appears less distinct on the present examination. Worsening bilateral mid and lower lung heterogeneous opacities. Suspected increase in (at least) small sized bilateral pleural effusions. Grossly unchanged bones.  IMPRESSION: 1. Interval placement of tracheostomy tube without apparent complication. 2. Otherwise, stable position of remaining support apparatus. 3. Findings suggestive of worsening pulmonary edema and bilateral mid and lower lung opacities, atelectasis versus infiltrate. Suspected interval increase in (at least) small sized bilateral pleural effusions. Continued attention on follow-up is  recommended.   Electronically Signed   By: Sandi Mariscal M.D.   On: 08/09/2013 09:57   Dg Chest Port 1 View  08/08/2013   CLINICAL DATA:  Rule out pneumonia  EXAM: PORTABLE CHEST - 1 VIEW  COMPARISON:  08/07/2013  FINDINGS: Endotracheal tube in good position. Right jugular catheter tip in the SVC. Left jugular catheter tip in the SVC. NG tube in the stomach. No pneumothorax  Diffuse bilateral airspace disease shows mild interval improvement. Small effusions and bibasilar atelectasis remain.  IMPRESSION: Support lines remain in good position.  Diffuse bilateral airspace disease shows mild interval improvement.   Electronically Signed   By: Franchot Gallo M.D.   On: 08/08/2013 07:37   Dg Chest Port 1 View  08/07/2013   CLINICAL DATA:  Central line placement.  EXAM: PORTABLE CHEST - 1 VIEW  COMPARISON:  Single view of the chest 08/07/2013 at 6:29 am.  FINDINGS: The patient has a new left IJ approach central venous catheter with the tip projecting over the mid superior vena cava. No pneumothorax. Support apparatus is otherwise unchanged. Bilateral effusions and airspace disease are unchanged.  IMPRESSION: Left IJ catheter tip projects over the mid superior vena cava. Negative for pneumothorax or other change.   Electronically Signed   By: Inge Rise M.D.   On: 08/07/2013 12:01     CXR: see above  ASSESSMENT    Acute respiratory failure following trauma and surgery  Atrial fibrillation   Ischemic necrosis of small bowel   Hyperglycemia   Acute renal failure   Acute blood loss anemia Gross overload Edema plm effusions  Plan: Vent bundle with goal of liberation from vent Early trach performed 1-22 Abx per [facility](neg cultures at [facility]) BD's as needed Volume reduction, consider daily HD abg  Rate control with currently sinus and off amio Volume reduction via HD Monitor cardiac per [facility]  Post bowel resection, per [facility]  Hyperglycemia per  [facility]  Renal failure with rt ij hd cath. Per ssh.  Consider daily HD for volume reduction.  Anemia per [facility] tx 2 units 1-21  [name] Minor ACNP Maryanna Shape PCCM Pager [phone] till 3 pm If no answer page [phone] 08/09/2013, 10:06 AM   Staff note Frail elderly person. S/p trach today. DEblitated. Poor prgnosis. Chronic critical illness. Rest per NP whose plan I agree with   Dr. Brand Males, M.D., [facility].C.P Pulmonary and Critical Care Medicine Staff Physician [place] [facility] Pager: [phone], If no answer or between  15:00h - 7:00h: call [phone]  08/09/2013 11:42 AM

## 2013-08-09 NOTE — Procedures (Signed)
Bronchoscopy  for Percutaneous  Tracheostomy  Name: Jason Harmon MRN: [id number] DOB: [birth year] Procedure: Bronchoscopy for Percutaneous Tracheostomy Indications: Diagnostic evaluation of the airways In conjunction with: Dr. Titus Mould   Procedure Details Consent: Risks of procedure as well as the alternatives and risks of each were explained to the (patient/caregiver).  Consent for procedure obtained. Time Out: Verified patient identification, verified procedure, site/side was marked, verified correct patient position, special equipment/implants available, medications/allergies/relevent history reviewed, required imaging and test results available.  Performed  In preparation for procedure, patient was given 100% FiO2 and bronchoscope lubricated. Sedation: Benzodiazepines, Muscle relaxants and Etomidate  Airway entered and the following bronchi were examined: RML.   Procedures performed: Endotracheal Tube retracted in 2 cm increments. Cannulation of airway observed. Dilation observed. Placement of trachel tube  observed . No overt complications. Bronchoscope removed.  , Patient placed back on 100% FiO2 at conclusion of procedure.    Evaluation Hemodynamic Status: Transient hypotension treated with fluid; O2 sats: transiently fell during during procedure Patient's Current Condition: stable Specimens:  None Complications: No apparent complications Patient did tolerate procedure well.   [name] Kamal Jurgens ACNP [facility]anna Shape PCCM Pager [phone] till 3 pm If no answer page [phone] 08/09/2013, 10:03 AM

## 2013-08-09 NOTE — Procedures (Signed)
Bedside Tracheostomy Insertion Procedure Note   Patient Details:   Name: Jason Harmon DOB: 02/13/1941 MRN: [id number]  Procedure: Tracheostomy  Pre Procedure Assessment: ET Tube Size:7.5 ET Tube secured at lip (cm):23 Bite block in place: Yes Breath Sounds: Rales  Post Procedure Assessment: There were no vitals taken for this visit. O2 sats: stable throughout Complications: No apparent complications Patient did tolerate procedure well Tracheostomy Brand:Shiley Tracheostomy Style:Cuffed Tracheostomy Size: 8.0 Tracheostomy Secured MBE:[id number], velcro Tracheostomy Placement Confirmation:Trach cuff visualized and in place, cxr taken for placement    Chau Sawin Ann 08/09/2013, 10:36 AM

## 2013-08-10 ENCOUNTER — Other Ambulatory Visit (HOSPITAL_COMMUNITY): Payer: Self-pay

## 2013-08-10 LAB — BASIC METABOLIC PANEL
BUN: 85 mg/dL — ABNORMAL HIGH (ref 6–23)
CALCIUM: 7 mg/dL — AB (ref 8.4–10.5)
CO2: 18 mEq/L — ABNORMAL LOW (ref 19–32)
CREATININE: 3.74 mg/dL — AB (ref 0.50–1.35)
Chloride: 99 mEq/L (ref 96–112)
GFR calc Af Amer: 17 mL/min — ABNORMAL LOW (ref >90)
GFR calc non Af Amer: 15 mL/min — ABNORMAL LOW (ref >90)
GLUCOSE: 192 mg/dL — AB (ref 70–99)
Potassium: 5.5 mEq/L — ABNORMAL HIGH (ref 3.7–5.3)
Sodium: 138 mEq/L (ref 137–147)

## 2013-08-10 LAB — PROCALCITONIN: Procalcitonin: 14.73 ng/mL

## 2013-08-10 LAB — CBC
HEMATOCRIT: 24.4 % — AB (ref 39.0–52.0)
Hemoglobin: 8.5 g/dL — ABNORMAL LOW (ref 13.0–17.0)
MCH: 30.9 pg (ref 26.0–34.0)
MCHC: 34.8 g/dL (ref 30.0–36.0)
MCV: 88.7 fL (ref 78.0–100.0)
Platelets: 373 10*3/uL (ref 150–400)
RBC: 2.75 MIL/uL — AB (ref 4.22–5.81)
RDW: 15.2 % (ref 11.5–15.5)
WBC: 16.8 10*3/uL — AB (ref 4.0–10.5)

## 2013-08-10 LAB — HEPATITIS B CORE ANTIBODY, TOTAL: Hep B Core Total Ab: NONREACTIVE

## 2013-08-10 LAB — HEPATITIS B SURFACE ANTIBODY,QUALITATIVE: HEP B S AB: NEGATIVE

## 2013-08-10 LAB — HEPATITIS B SURFACE ANTIGEN: Hepatitis B Surface Ag: NEGATIVE

## 2013-08-11 ENCOUNTER — Other Ambulatory Visit (HOSPITAL_COMMUNITY): Payer: Self-pay

## 2013-08-11 LAB — BASIC METABOLIC PANEL
BUN: 104 mg/dL — ABNORMAL HIGH (ref 6–23)
CHLORIDE: 98 meq/L (ref 96–112)
CO2: 18 meq/L — AB (ref 19–32)
CREATININE: 4.62 mg/dL — AB (ref 0.50–1.35)
Calcium: 7.2 mg/dL — ABNORMAL LOW (ref 8.4–10.5)
GFR calc non Af Amer: 12 mL/min — ABNORMAL LOW (ref >90)
GFR, EST AFRICAN AMERICAN: 14 mL/min — AB (ref >90)
GLUCOSE: 205 mg/dL — AB (ref 70–99)
Potassium: 4.4 mEq/L (ref 3.7–5.3)
Sodium: 138 mEq/L (ref 137–147)

## 2013-08-11 LAB — CBC
HEMATOCRIT: 18.5 % — AB (ref 39.0–52.0)
HEMOGLOBIN: 6.6 g/dL — AB (ref 13.0–17.0)
MCH: 31.1 pg (ref 26.0–34.0)
MCHC: 35.7 g/dL (ref 30.0–36.0)
MCV: 87.3 fL (ref 78.0–100.0)
Platelets: 371 10*3/uL (ref 150–400)
RBC: 2.12 MIL/uL — AB (ref 4.22–5.81)
RDW: 15 % (ref 11.5–15.5)
WBC: 18.9 10*3/uL — ABNORMAL HIGH (ref 4.0–10.5)

## 2013-08-11 LAB — PREPARE RBC (CROSSMATCH)

## 2013-08-12 ENCOUNTER — Other Ambulatory Visit (HOSPITAL_COMMUNITY): Payer: Self-pay

## 2013-08-12 DIAGNOSIS — J189 Pneumonia, unspecified organism: Secondary | ICD-10-CM | POA: Insufficient documentation

## 2013-08-12 DIAGNOSIS — J9600 Acute respiratory failure, unspecified whether with hypoxia or hypercapnia: Secondary | ICD-10-CM

## 2013-08-12 DIAGNOSIS — J811 Chronic pulmonary edema: Secondary | ICD-10-CM

## 2013-08-12 DIAGNOSIS — Z930 Tracheostomy status: Secondary | ICD-10-CM

## 2013-08-12 LAB — CBC
HEMATOCRIT: 21.1 % — AB (ref 39.0–52.0)
Hemoglobin: 7.5 g/dL — ABNORMAL LOW (ref 13.0–17.0)
MCH: 30.5 pg (ref 26.0–34.0)
MCHC: 35.5 g/dL (ref 30.0–36.0)
MCV: 85.8 fL (ref 78.0–100.0)
Platelets: 397 10*3/uL (ref 150–400)
RBC: 2.46 MIL/uL — AB (ref 4.22–5.81)
RDW: 15.1 % (ref 11.5–15.5)
WBC: 18.7 10*3/uL — AB (ref 4.0–10.5)

## 2013-08-12 LAB — BLOOD GAS, ARTERIAL
Acid-base deficit: 5.1 mmol/L — ABNORMAL HIGH (ref 0.0–2.0)
Bicarbonate: 19.1 mEq/L — ABNORMAL LOW (ref 20.0–24.0)
FIO2: 0.5 %
O2 SAT: 98.8 %
PEEP/CPAP: 10 cmH2O
PRESSURE CONTROL: 14 cmH2O
Patient temperature: 98.6
RATE: 30 resp/min
TCO2: 20.1 mmol/L (ref 0–100)
pCO2 arterial: 33.1 mmHg — ABNORMAL LOW (ref 35.0–45.0)
pH, Arterial: 7.38 (ref 7.350–7.450)
pO2, Arterial: 129 mmHg — ABNORMAL HIGH (ref 80.0–100.0)

## 2013-08-12 LAB — COMPREHENSIVE METABOLIC PANEL
ALBUMIN: 1.7 g/dL — AB (ref 3.5–5.2)
ALT: 15 U/L (ref 0–53)
AST: 21 U/L (ref 0–37)
Alkaline Phosphatase: 67 U/L (ref 39–117)
BUN: 130 mg/dL — AB (ref 6–23)
CO2: 16 mEq/L — ABNORMAL LOW (ref 19–32)
CREATININE: 5.25 mg/dL — AB (ref 0.50–1.35)
Calcium: 7.2 mg/dL — ABNORMAL LOW (ref 8.4–10.5)
Chloride: 98 mEq/L (ref 96–112)
GFR calc Af Amer: 12 mL/min — ABNORMAL LOW (ref >90)
GFR calc non Af Amer: 10 mL/min — ABNORMAL LOW (ref >90)
Glucose, Bld: 257 mg/dL — ABNORMAL HIGH (ref 70–99)
Potassium: 4.3 mEq/L (ref 3.7–5.3)
Sodium: 140 mEq/L (ref 137–147)
TOTAL PROTEIN: 4.9 g/dL — AB (ref 6.0–8.3)
Total Bilirubin: 0.8 mg/dL (ref 0.3–1.2)

## 2013-08-12 LAB — DIFFERENTIAL
BASOS PCT: 0 % (ref 0–1)
Basophils Absolute: 0 10*3/uL (ref 0.0–0.1)
EOS PCT: 0 % (ref 0–5)
Eosinophils Absolute: 0 10*3/uL (ref 0.0–0.7)
LYMPHS ABS: 0.7 10*3/uL (ref 0.7–4.0)
Lymphocytes Relative: 4 % — ABNORMAL LOW (ref 12–46)
MONO ABS: 1.5 10*3/uL — AB (ref 0.1–1.0)
Monocytes Relative: 8 % (ref 3–12)
Neutro Abs: 16.5 10*3/uL — ABNORMAL HIGH (ref 1.7–7.7)
Neutrophils Relative %: 88 % — ABNORMAL HIGH (ref 43–77)

## 2013-08-12 LAB — TRIGLYCERIDES: Triglycerides: 101 mg/dL (ref <150)

## 2013-08-12 LAB — PREALBUMIN: Prealbumin: 12 mg/dL — ABNORMAL LOW (ref 17.0–34.0)

## 2013-08-12 LAB — PROCALCITONIN: Procalcitonin: 6.68 ng/mL

## 2013-08-12 LAB — MAGNESIUM: Magnesium: 2.4 mg/dL (ref 1.5–2.5)

## 2013-08-12 LAB — PHOSPHORUS: PHOSPHORUS: 9.6 mg/dL — AB (ref 2.3–4.6)

## 2013-08-12 NOTE — Progress Notes (Signed)
PULMONARY  / CRITICAL CARE MEDICINE  Name: Jason Harmon MRN: [id number] DOB: [birth year]    SUBJECTIVE: No overnight issues.  PHYSICAL EXAMINATION:  T 98.0 P 85 R 22 BP 117/62 SpO2 96  General:  Appears chronically ill, in no distress Neuro:  Sedated but arousable HEENT:  Tracheostomy site intact Cardiovascular:  Irregular, no murmurs Lungs:  Bilateral rales / ronchi Abdomen:  Soft Musculoskeletal:  Moves all extremities Skin:  Anasarca / scrotal edema  LABS:  Recent Labs Lab 08/06/13 1800  08/07/13 0800 08/07/13 2200 08/07/13 2259 08/08/13 0501 08/08/13 0845 08/09/13 0637 08/09/13 1620 08/09/13 1730 08/10/13 0511 08/11/13 0554 08/12/13 0648  HGB  --   < > 6.0*  --   --   --  8.1* 7.5*  --   --  8.5* 6.6* 7.5*  WBC  --   < > 15.8*  --   --   --  23.1* 17.7*  --   --  16.8* 18.9* 18.7*  PLT  --   < > 238  --   --   --  308 373  --   --  373 371 397  NA  --   < > 138 138  --  139  --   --  143 139 138 138 140  K  --   < > 5.1 6.0*  --  5.0  --   --  5.3 5.5* 5.5* 4.4 4.3  CL  --   < > 99 99  --  98  --   --  104 99 99 98 98  CO2  --   < > 24 22  --  23  --   --  20 18* 18* 18* 16*  GLUCOSE  --   < > 110* 197*  --  190*  --   --  215* 174* 192* 205* 257*  BUN  --   < > 71* 84*  --  86*  --   --  111* 102* 85* 104* 130*  CREATININE  --   < > 2.62* 3.10*  --  3.39*  --   --  4.41* 4.13* 3.74* 4.62* 5.25*  CALCIUM  --   < > 7.1* 7.1*  --  7.0*  --   --  6.7* 7.3* 7.0* 7.2* 7.2*  MG  --   --   --   --   --   --   --   --   --   --   --   --  2.4  PHOS  --   --   --   --   --   --   --   --   --  7.3*  --   --  9.6*  AST  --   < > 23 27  --  26  --   --   --   --   --   --  21  ALT  --   < > 32 32  --  30  --   --   --   --   --   --  15  ALKPHOS  --   < > 79 90  --  87  --   --   --   --   --   --  67  BILITOT  --   < > 0.9 1.0  --  0.9  --   --   --   --   --   --  0.8  PROT  --   < > 4.4* 5.0*  --  5.0*  --   --   --   --   --   --  4.9*  ALBUMIN  --   < > 1.3*  1.4*  --  1.8*  --   --   --  2.2*  --   --  1.7*  APTT  --   --   --   --   --  37  --   --   --   --   --   --   --   INR  --   --   --   --  1.24 1.21  --   --   --   --   --   --   --   LATICACIDVEN  --   --   --  1.4  --   --   --   --   --   --   --   --   --   PROCALCITON  --   --  1.60  --   --   --   --   --   --   --  14.73  --  6.68  PROBNP  --   --   --   --   --   --   --  6122.0*  --   --   --   --   --   PHART 7.400  --   --   --   --   --   --   --   --   --   --   --   --   PCO2ART 44.6  --   --   --   --   --   --   --   --   --   --   --   --   PO2ART 96.0  --   --   --   --   --   --   --   --   --   --   --   --   < > = values in this interval not displayed. 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  Doree Fudge, MD Pulmonary and [place] Pager: [phone]  08/12/2013, 12:30 PM

## 2013-08-13 LAB — CBC
HCT: 21.1 % — ABNORMAL LOW (ref 39.0–52.0)
Hemoglobin: 7.5 g/dL — ABNORMAL LOW (ref 13.0–17.0)
MCH: 31.1 pg (ref 26.0–34.0)
MCHC: 35.5 g/dL (ref 30.0–36.0)
MCV: 87.6 fL (ref 78.0–100.0)
PLATELETS: 409 10*3/uL — AB (ref 150–400)
RBC: 2.41 MIL/uL — ABNORMAL LOW (ref 4.22–5.81)
RDW: 15.1 % (ref 11.5–15.5)
WBC: 18.6 10*3/uL — ABNORMAL HIGH (ref 4.0–10.5)

## 2013-08-13 LAB — RENAL FUNCTION PANEL
Albumin: 1.6 g/dL — ABNORMAL LOW (ref 3.5–5.2)
BUN: 147 mg/dL — ABNORMAL HIGH (ref 6–23)
CALCIUM: 7 mg/dL — AB (ref 8.4–10.5)
CO2: 16 mEq/L — ABNORMAL LOW (ref 19–32)
Chloride: 99 mEq/L (ref 96–112)
Creatinine, Ser: 5.62 mg/dL — ABNORMAL HIGH (ref 0.50–1.35)
GFR calc Af Amer: 11 mL/min — ABNORMAL LOW (ref >90)
GFR, EST NON AFRICAN AMERICAN: 9 mL/min — AB (ref >90)
Glucose, Bld: 407 mg/dL — ABNORMAL HIGH (ref 70–99)
PHOSPHORUS: 10.5 mg/dL — AB (ref 2.3–4.6)
Potassium: 3.8 mEq/L (ref 3.7–5.3)
Sodium: 141 mEq/L (ref 137–147)

## 2013-08-14 ENCOUNTER — Other Ambulatory Visit (HOSPITAL_COMMUNITY): Payer: Self-pay

## 2013-08-14 DIAGNOSIS — J90 Pleural effusion, not elsewhere classified: Secondary | ICD-10-CM | POA: Diagnosis not present

## 2013-08-14 LAB — CULTURE, BLOOD (ROUTINE X 2)
Culture: NO GROWTH
Culture: NO GROWTH

## 2013-08-14 LAB — CBC
HEMATOCRIT: 21.8 % — AB (ref 39.0–52.0)
Hemoglobin: 7.7 g/dL — ABNORMAL LOW (ref 13.0–17.0)
MCH: 30.7 pg (ref 26.0–34.0)
MCHC: 35.3 g/dL (ref 30.0–36.0)
MCV: 86.9 fL (ref 78.0–100.0)
Platelets: 441 10*3/uL — ABNORMAL HIGH (ref 150–400)
RBC: 2.51 MIL/uL — ABNORMAL LOW (ref 4.22–5.81)
RDW: 14.7 % (ref 11.5–15.5)
WBC: 17.5 10*3/uL — ABNORMAL HIGH (ref 4.0–10.5)

## 2013-08-14 LAB — RENAL FUNCTION PANEL
ALBUMIN: 1.6 g/dL — AB (ref 3.5–5.2)
BUN: 115 mg/dL — AB (ref 6–23)
CO2: 21 mEq/L (ref 19–32)
Calcium: 7.1 mg/dL — ABNORMAL LOW (ref 8.4–10.5)
Chloride: 100 mEq/L (ref 96–112)
Creatinine, Ser: 4.43 mg/dL — ABNORMAL HIGH (ref 0.50–1.35)
GFR calc Af Amer: 14 mL/min — ABNORMAL LOW (ref >90)
GFR calc non Af Amer: 12 mL/min — ABNORMAL LOW (ref >90)
Glucose, Bld: 311 mg/dL — ABNORMAL HIGH (ref 70–99)
PHOSPHORUS: 8.2 mg/dL — AB (ref 2.3–4.6)
Potassium: 3.4 mEq/L — ABNORMAL LOW (ref 3.7–5.3)
SODIUM: 143 meq/L (ref 137–147)

## 2013-08-14 NOTE — Progress Notes (Addendum)
PULMONARY  / CRITICAL CARE MEDICINE  Name: Jason Harmon MRN: [id number] DOB: [birth year] BRIEF PATIENT DESCRIPTION:  73 yo wm with no significant PMH who presented to [facility] 1-9 with N/V and found to have SBO and underwent resection. ACR, CVVHD, gross overload  SIGNIFICANT EVENTS / STUDIES:  1-22 trached per DF   LINES / TUBES:  1-9 ott>>1-22  1-9 rt fem cvl>>1-21  1-12 rt i j hd cath>>  1-21 LIJ cvl>>  1-22 trach(DF)>>   CULTURES:   SUBJECTIVE: No overnight issues.  PHYSICAL EXAMINATION:  Vital signs reviewed  General:  Appears chronically ill, in no distress Neuro:  Sedated but arousable HEENT:  Tracheostomy site intact Cardiovascular:  Irregular, no murmurs Lungs:  Bilateral rales / ronchi Abdomen:  Soft Musculoskeletal:  Moves all extremities Skin:  Anasarca / scrotal edema  LABS:  Recent Labs Lab 08/12/13 0648 08/13/13 0810 08/14/13 0439  NA 140 141 143  K 4.3 3.8 3.4*  CL 98 99 100  CO2 16* 16* 21  BUN 130* 147* 115*  CREATININE 5.25* 5.62* 4.43*  GLUCOSE 257* 407* 311*    Recent Labs Lab 08/12/13 0648 08/13/13 0810 08/14/13 0439  HGB 7.5* 7.5* 7.7*  HCT 21.1* 21.1* 21.8*  WBC 18.7* 18.6* 17.5*  PLT 397 409* 441*   No results found for this basename: GLUCAP,  in the last 168 hours  CXR:  No sig improvement in diffuse bilateral airspace disease.   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 Volume removal via HD Taper steroids  BABCOCK,PETE  08/14/2013, 9:54 AM  I have personally obtained history, examined patient, evaluated and interpreted laboratory and imaging results, reviewed medical records, formulated assessment / plan and placed orders.  Doree Fudge, MD Pulmonary and [place] Pager: [phone]  08/14/2013, 12:16 PM

## 2013-08-15 LAB — TYPE AND SCREEN
ABO/RH(D): A POS
Antibody Screen: NEGATIVE
Unit division: 0
Unit division: 0

## 2013-08-16 ENCOUNTER — Other Ambulatory Visit (HOSPITAL_COMMUNITY): Payer: Self-pay

## 2013-08-16 LAB — CBC
HCT: 25.1 % — ABNORMAL LOW (ref 39.0–52.0)
Hemoglobin: 8.7 g/dL — ABNORMAL LOW (ref 13.0–17.0)
MCH: 30.3 pg (ref 26.0–34.0)
MCHC: 34.7 g/dL (ref 30.0–36.0)
MCV: 87.5 fL (ref 78.0–100.0)
PLATELETS: 494 10*3/uL — AB (ref 150–400)
RBC: 2.87 MIL/uL — ABNORMAL LOW (ref 4.22–5.81)
RDW: 14.4 % (ref 11.5–15.5)
WBC: 25.5 10*3/uL — AB (ref 4.0–10.5)

## 2013-08-16 LAB — RENAL FUNCTION PANEL
ALBUMIN: 1.5 g/dL — AB (ref 3.5–5.2)
BUN: 104 mg/dL — ABNORMAL HIGH (ref 6–23)
CHLORIDE: 97 meq/L (ref 96–112)
CO2: 22 mEq/L (ref 19–32)
CREATININE: 3.42 mg/dL — AB (ref 0.50–1.35)
Calcium: 7 mg/dL — ABNORMAL LOW (ref 8.4–10.5)
GFR calc Af Amer: 19 mL/min — ABNORMAL LOW (ref >90)
GFR, EST NON AFRICAN AMERICAN: 17 mL/min — AB (ref >90)
Glucose, Bld: 363 mg/dL — ABNORMAL HIGH (ref 70–99)
PHOSPHORUS: 8 mg/dL — AB (ref 2.3–4.6)
Potassium: 3.7 mEq/L (ref 3.7–5.3)
Sodium: 140 mEq/L (ref 137–147)

## 2013-08-16 NOTE — H&P (Signed)
Referring Physician: [facility] HPI: Jason Harmon is an 73 y.o. male who presented to [facility] from [facility] after respiratory failure post small bowel resection. IR has received request for image guided percutaneous gastric tube placement for malnutrition and long term feeding. The patient's wife is present today during interview and would like to move forward with gastric tube. The patient currently has NGT in place.   Past Medical History: SBO, AKI, Acute respiratory failure.  Past Surgical History: Small bowel resection 07/26/13  Family History: No family history on file.  Social History:  has no tobacco, alcohol, and drug history on file.  Allergies: Not on File  Medications: Meropenem, flagyl, prednisone.  See chart for full medication list.   Please HPI for pertinent positives, otherwise complete 10 system ROS negative.  Physical Exam: T: 98.105F, HR: 130bpm, BP: 135/56mHg, O2 95%  General Appearance:  Tracheostomy intact, patient receiving HD currently, NAD  Head:  Normocephalic, without obvious abnormality, atraumatic  Lungs:   Rhonchi bilaterally, respirations unlabored without use of accessory muscles.  Chest Wall:  No tenderness or deformity  Heart:  Tachycardic, Irregular rate and rhythm, S1, S2 normal, no murmur, rub or gallop.  Abdomen:   Soft, surgical staples present C/D/I, LLQ ulceration and erythema, non-tender, non distended, (+) BS  Extremities: Extremities normal, atraumatic, no cyanosis or edema   Results for orders placed during the hospital encounter of 08/06/13 (from the past 48 hour(s))  CBC     Status: Abnormal   Collection Time    08/16/13  6:35 AM      Result Value Range   WBC 25.5 (*) 4.0 - 10.5 K/uL   RBC 2.87 (*) 4.22 - 5.81 MIL/uL   Hemoglobin 8.7 (*) 13.0 - 17.0 g/dL   HCT 25.1 (*) 39.0 - 52.0 %   MCV 87.5  78.0 - 100.0 fL   MCH 30.3  26.0 - 34.0 pg   MCHC 34.7  30.0 - 36.0 g/dL   RDW 14.4  11.5 - 15.5 %   Platelets 494 (*) 150 - 400 K/uL  RENAL FUNCTION  PANEL     Status: Abnormal   Collection Time    08/16/13  6:35 AM      Result Value Range   Sodium 140  137 - 147 mEq/L   Potassium 3.7  3.7 - 5.3 mEq/L   Chloride 97  96 - 112 mEq/L   CO2 22  19 - 32 mEq/L   Glucose, Bld 363 (*) 70 - 99 mg/dL   BUN 104 (*) 6 - 23 mg/dL   Creatinine, Ser 3.42 (*) 0.50 - 1.35 mg/dL   Calcium 7.0 (*) 8.4 - 10.5 mg/dL   Phosphorus 8.0 (*) 2.3 - 4.6 mg/dL   Albumin 1.5 (*) 3.5 - 5.2 g/dL   GFR calc non Af Amer 17 (*) >90 mL/min   GFR calc Af Amer 19 (*) >90 mL/min   Comment: (NOTE)     The eGFR has been calculated using the CKD EPI equation.     This calculation has not been validated in all clinical situations.     eGFR's persistently <90 mL/min signify possible Chronic Kidney     Disease.   No results found.  [facility] 08[facility]13 Anemia, hgb 8.7 today (7.7) will follow.  AKI on HD.  Malnutrition, request for image guided percutaneous gastric tube placement. Images reviewed by Dr. YKathlene Cote  Labs reviewed, VSS reviewed HR 130-140's Leukocytosis, on  meropenem and prednisone, afebrile. Risks and Benefits discussed with the patient's wife. All questions were answered, patient's wife is agreeable to proceed. Consent signed and in IR. IR will evaluate labs and vitals signs, if HR improves and labs stable will give barium Sunday evening for possible 08/19/13 g-tube  wife is aware of the plan and agrees with the above.   Tsosie Billing D PA-C 08/16/2013, 4:50 PM

## 2013-08-16 NOTE — Progress Notes (Signed)
PULMONARY  / CRITICAL CARE MEDICINE  Name: Jason Harmon MRN: [id number] DOB: [birth year]  BRIEF PATIENT DESCRIPTION:  73 yo wm with no significant PMH who presented to [facility] 1/9 with nausea and vomiting and found to have SBO and underwent resection.  Course was complicated by acute respiratory failure.  SIGNIFICANT EVENTS / STUDIES:  1/ 22 Tracheostomy  (DF)  CULTURES: 1/21  Blood >>> neg  ANTIBIOTICS:   SUBJECTIVE:  No overnight issues.  PHYSICAL EXAMINATION:  Vital signs reviewed  General:  Appears chronically ill, resting comfortable Neuro:  Awake, non verbal HEENT:  Tracheostomy intact Cardiovascular:  Irregular, no murmurs Lungs:  Bilateral air entry, rhonchi Abdomen:  Soft, bowel sounds present Musculoskeletal:  Moves all extremities Skin:  Anasarca / scrotal edema   Recent Labs Lab 08/09/13 1730  08/11/13 0554 08/12/13 0648 08/12/13 1750 08/13/13 0810 08/14/13 0439 08/16/13 0635  HGB  --   < > 6.6* 7.5*  --  7.5* 7.7* 8.7*  WBC  --   < > 18.9* 18.7*  --  18.6* 17.5* 25.5*  PLT  --   < > 371 397  --  409* 441* 494*  NA 139  < > 138 140  --  141 143 140  K 5.5*  < > 4.4 4.3  --  3.8 3.4* 3.7  CL 99  < > 98 98  --  99 100 97  CO2 18*  < > 18* 16*  --  16* 21 22  GLUCOSE 174*  < > 205* 257*  --  407* 311* 363*  BUN 102*  < > 104* 130*  --  147* 115* 104*  CREATININE 4.13*  < > 4.62* 5.25*  --  5.62* 4.43* 3.42*  CALCIUM 7.3*  < > 7.2* 7.2*  --  7.0* 7.1* 7.0*  MG  --   --   --  2.4  --   --   --   --   PHOS 7.3*  --   --  9.6*  --  10.5* 8.2* 8.0*  AST  --   --   --  21  --   --   --   --   ALT  --   --   --  15  --   --   --   --   ALKPHOS  --   --   --  67  --   --   --   --   BILITOT  --   --   --  0.8  --   --   --   --   PROT  --   --   --  4.9*  --   --   --   --   ALBUMIN 2.2*  --   --  1.7*  --  1.6* 1.6* 1.5*  PHART  --   --   --   --  7.380  --   --   --   PCO2ART  --   --   --   --  33.1*  --   --   --   PO2ART  --   --   --   --  129.0*   --   --   --   HCO3  --   --   --   --  19.1*  --   --   --   O2SAT  --   --   --   --  98.8  --   --   --   < > =  values in this interval not displayed.  CXR:  None today  ASSESSMENT:  Acute respiratory failure Pneumonia Less likely pulmonary edema AF/RVR AKI on HD Tracheostomy status  PLAN:  Continuous mechanical support  Wean as tolerated Continue antibiotics Volume removal via HD Taper steroids  I have personally obtained history, examined patient, evaluated and interpreted laboratory and imaging results, reviewed medical records, formulated assessment / plan and placed orders.  Doree Fudge, MD Pulmonary and [place] Pager: [phone]  08/16/2013, 11:12 AM

## 2013-08-17 ENCOUNTER — Encounter: Payer: Self-pay | Admitting: Radiology

## 2013-08-17 ENCOUNTER — Other Ambulatory Visit (HOSPITAL_COMMUNITY): Payer: Self-pay

## 2013-08-17 DIAGNOSIS — R6520 Severe sepsis without septic shock: Secondary | ICD-10-CM | POA: Diagnosis not present

## 2013-08-17 DIAGNOSIS — R6521 Severe sepsis with septic shock: Secondary | ICD-10-CM | POA: Diagnosis not present

## 2013-08-17 DIAGNOSIS — K659 Peritonitis, unspecified: Secondary | ICD-10-CM | POA: Diagnosis not present

## 2013-08-17 DIAGNOSIS — A419 Sepsis, unspecified organism: Secondary | ICD-10-CM | POA: Diagnosis not present

## 2013-08-17 DIAGNOSIS — J9600 Acute respiratory failure, unspecified whether with hypoxia or hypercapnia: Secondary | ICD-10-CM | POA: Diagnosis not present

## 2013-08-17 DIAGNOSIS — K631 Perforation of intestine (nontraumatic): Secondary | ICD-10-CM | POA: Diagnosis not present

## 2013-08-17 DIAGNOSIS — R141 Gas pain: Secondary | ICD-10-CM | POA: Diagnosis not present

## 2013-08-17 DIAGNOSIS — S298XXA Other specified injuries of thorax, initial encounter: Secondary | ICD-10-CM | POA: Diagnosis not present

## 2013-08-17 LAB — CBC
HEMATOCRIT: 29.2 % — AB (ref 39.0–52.0)
HEMATOCRIT: 29.3 % — AB (ref 39.0–52.0)
HEMOGLOBIN: 10 g/dL — AB (ref 13.0–17.0)
HEMOGLOBIN: 10 g/dL — AB (ref 13.0–17.0)
MCH: 30.4 pg (ref 26.0–34.0)
MCH: 30.5 pg (ref 26.0–34.0)
MCHC: 34.2 g/dL (ref 30.0–36.0)
MCHC: 34.1 g/dL (ref 30.0–36.0)
MCV: 88.8 fL (ref 78.0–100.0)
MCV: 89.3 fL (ref 78.0–100.0)
Platelets: 500 10*3/uL — ABNORMAL HIGH (ref 150–400)
Platelets: 451 10*3/uL — ABNORMAL HIGH (ref 150–400)
RBC: 3.29 MIL/uL — AB (ref 4.22–5.81)
RBC: 3.28 MIL/uL — ABNORMAL LOW (ref 4.22–5.81)
RDW: 14.7 % (ref 11.5–15.5)
RDW: 14.7 % (ref 11.5–15.5)
WBC: 40.9 10*3/uL — AB (ref 4.0–10.5)
WBC: 41.2 10*3/uL — ABNORMAL HIGH (ref 4.0–10.5)

## 2013-08-17 LAB — HEPATIC FUNCTION PANEL
ALT: 11 U/L (ref 0–53)
AST: 20 U/L (ref 0–37)
Albumin: 1.5 g/dL — ABNORMAL LOW (ref 3.5–5.2)
Alkaline Phosphatase: 60 U/L (ref 39–117)
BILIRUBIN INDIRECT: 0.3 mg/dL (ref 0.3–0.9)
BILIRUBIN TOTAL: 0.6 mg/dL (ref 0.3–1.2)
Bilirubin, Direct: 0.3 mg/dL (ref 0.0–0.3)
Total Protein: 4.6 g/dL — ABNORMAL LOW (ref 6.0–8.3)

## 2013-08-17 LAB — BASIC METABOLIC PANEL
BUN: 73 mg/dL — AB (ref 6–23)
BUN: 77 mg/dL — ABNORMAL HIGH (ref 6–23)
CHLORIDE: 97 meq/L (ref 96–112)
CO2: 24 meq/L (ref 19–32)
CO2: 22 mEq/L (ref 19–32)
CREATININE: 2.39 mg/dL — AB (ref 0.50–1.35)
Calcium: 7.1 mg/dL — ABNORMAL LOW (ref 8.4–10.5)
Calcium: 6.9 mg/dL — ABNORMAL LOW (ref 8.4–10.5)
Chloride: 99 mEq/L (ref 96–112)
Creatinine, Ser: 2.48 mg/dL — ABNORMAL HIGH (ref 0.50–1.35)
GFR calc Af Amer: 29 mL/min — ABNORMAL LOW (ref >90)
GFR calc non Af Amer: 26 mL/min — ABNORMAL LOW (ref >90)
GFR calc non Af Amer: 25 mL/min — ABNORMAL LOW (ref >90)
GFR, EST AFRICAN AMERICAN: 30 mL/min — AB (ref >90)
GLUCOSE: 337 mg/dL — AB (ref 70–99)
Glucose, Bld: 288 mg/dL — ABNORMAL HIGH (ref 70–99)
POTASSIUM: 4 meq/L (ref 3.7–5.3)
Potassium: 3.7 mEq/L (ref 3.7–5.3)
Sodium: 141 mEq/L (ref 137–147)
Sodium: 137 mEq/L (ref 137–147)

## 2013-08-17 LAB — LACTIC ACID, PLASMA: Lactic Acid, Venous: 2.5 mmol/L — ABNORMAL HIGH (ref 0.5–2.2)

## 2013-08-17 LAB — PROCALCITONIN: Procalcitonin: 0.95 ng/mL

## 2013-08-17 MED ORDER — IOHEXOL 300 MG/ML  SOLN
80.0000 mL | Freq: Once | INTRAMUSCULAR | Status: AC | PRN
  Administered 2013-08-17: 80 mL via INTRAVENOUS

## 2013-08-18 ENCOUNTER — Emergency Department (HOSPITAL_COMMUNITY): Payer: Medicare Other | Admitting: Anesthesiology

## 2013-08-18 ENCOUNTER — Encounter (HOSPITAL_COMMUNITY)
Admission: EM | Disposition: A | Discharge status: Nursing Home (Includes ICF) | Payer: Self-pay | Source: Home / Self Care | Attending: Pulmonary Disease

## 2013-08-18 ENCOUNTER — Encounter (HOSPITAL_COMMUNITY): Payer: Self-pay | Admitting: Emergency Medicine

## 2013-08-18 ENCOUNTER — Inpatient Hospital Stay (HOSPITAL_COMMUNITY)
Admission: EM | Admit: 2013-08-18 | Discharge: 2013-09-04 | DRG: 853 | Disposition: A | Discharge status: Other Acute Inpatient Hospital | Payer: Medicare Other | Attending: Pulmonary Disease | Admitting: Pulmonary Disease

## 2013-08-18 ENCOUNTER — Encounter (HOSPITAL_COMMUNITY): Payer: Medicare Other | Admitting: Anesthesiology

## 2013-08-18 DIAGNOSIS — E46 Unspecified protein-calorie malnutrition: Secondary | ICD-10-CM | POA: Diagnosis present

## 2013-08-18 DIAGNOSIS — R918 Other nonspecific abnormal finding of lung field: Secondary | ICD-10-CM | POA: Diagnosis not present

## 2013-08-18 DIAGNOSIS — E876 Hypokalemia: Secondary | ICD-10-CM | POA: Diagnosis not present

## 2013-08-18 DIAGNOSIS — R042 Hemoptysis: Secondary | ICD-10-CM | POA: Diagnosis not present

## 2013-08-18 DIAGNOSIS — R1930 Abdominal rigidity, unspecified site: Secondary | ICD-10-CM | POA: Diagnosis not present

## 2013-08-18 DIAGNOSIS — I509 Heart failure, unspecified: Secondary | ICD-10-CM | POA: Diagnosis not present

## 2013-08-18 DIAGNOSIS — N179 Acute kidney failure, unspecified: Secondary | ICD-10-CM | POA: Diagnosis not present

## 2013-08-18 DIAGNOSIS — J168 Pneumonia due to other specified infectious organisms: Secondary | ICD-10-CM | POA: Diagnosis not present

## 2013-08-18 DIAGNOSIS — E872 Acidosis, unspecified: Secondary | ICD-10-CM | POA: Diagnosis not present

## 2013-08-18 DIAGNOSIS — Y832 Surgical operation with anastomosis, bypass or graft as the cause of abnormal reaction of the patient, or of later complication, without mention of misadventure at the time of the procedure: Secondary | ICD-10-CM | POA: Diagnosis not present

## 2013-08-18 DIAGNOSIS — IMO0002 Reserved for concepts with insufficient information to code with codable children: Secondary | ICD-10-CM

## 2013-08-18 DIAGNOSIS — I4891 Unspecified atrial fibrillation: Secondary | ICD-10-CM | POA: Diagnosis not present

## 2013-08-18 DIAGNOSIS — S298XXA Other specified injuries of thorax, initial encounter: Secondary | ICD-10-CM | POA: Diagnosis not present

## 2013-08-18 DIAGNOSIS — K929 Disease of digestive system, unspecified: Secondary | ICD-10-CM | POA: Diagnosis not present

## 2013-08-18 DIAGNOSIS — D62 Acute posthemorrhagic anemia: Secondary | ICD-10-CM | POA: Diagnosis not present

## 2013-08-18 DIAGNOSIS — R141 Gas pain: Secondary | ICD-10-CM | POA: Diagnosis not present

## 2013-08-18 DIAGNOSIS — N186 End stage renal disease: Secondary | ICD-10-CM | POA: Diagnosis present

## 2013-08-18 DIAGNOSIS — Z992 Dependence on renal dialysis: Secondary | ICD-10-CM

## 2013-08-18 DIAGNOSIS — R5381 Other malaise: Secondary | ICD-10-CM | POA: Diagnosis present

## 2013-08-18 DIAGNOSIS — R6521 Severe sepsis with septic shock: Secondary | ICD-10-CM | POA: Diagnosis not present

## 2013-08-18 DIAGNOSIS — Z515 Encounter for palliative care: Secondary | ICD-10-CM | POA: Diagnosis not present

## 2013-08-18 DIAGNOSIS — R6520 Severe sepsis without septic shock: Secondary | ICD-10-CM

## 2013-08-18 DIAGNOSIS — N170 Acute kidney failure with tubular necrosis: Secondary | ICD-10-CM | POA: Diagnosis not present

## 2013-08-18 DIAGNOSIS — Z4682 Encounter for fitting and adjustment of non-vascular catheter: Secondary | ICD-10-CM | POA: Diagnosis not present

## 2013-08-18 DIAGNOSIS — J984 Other disorders of lung: Secondary | ICD-10-CM | POA: Diagnosis not present

## 2013-08-18 DIAGNOSIS — D631 Anemia in chronic kidney disease: Secondary | ICD-10-CM | POA: Diagnosis not present

## 2013-08-18 DIAGNOSIS — K55029 Acute infarction of small intestine, extent unspecified: Secondary | ICD-10-CM

## 2013-08-18 DIAGNOSIS — K659 Peritonitis, unspecified: Secondary | ICD-10-CM | POA: Diagnosis not present

## 2013-08-18 DIAGNOSIS — K631 Perforation of intestine (nontraumatic): Secondary | ICD-10-CM | POA: Diagnosis not present

## 2013-08-18 DIAGNOSIS — J95821 Acute postprocedural respiratory failure: Secondary | ICD-10-CM

## 2013-08-18 DIAGNOSIS — I120 Hypertensive chronic kidney disease with stage 5 chronic kidney disease or end stage renal disease: Secondary | ICD-10-CM | POA: Diagnosis present

## 2013-08-18 DIAGNOSIS — D72829 Elevated white blood cell count, unspecified: Secondary | ICD-10-CM | POA: Diagnosis present

## 2013-08-18 DIAGNOSIS — A419 Sepsis, unspecified organism: Principal | ICD-10-CM | POA: Diagnosis present

## 2013-08-18 DIAGNOSIS — K559 Vascular disorder of intestine, unspecified: Secondary | ICD-10-CM | POA: Diagnosis not present

## 2013-08-18 DIAGNOSIS — K658 Other peritonitis: Secondary | ICD-10-CM | POA: Diagnosis present

## 2013-08-18 DIAGNOSIS — R143 Flatulence: Secondary | ICD-10-CM | POA: Diagnosis not present

## 2013-08-18 DIAGNOSIS — J9819 Other pulmonary collapse: Secondary | ICD-10-CM | POA: Diagnosis not present

## 2013-08-18 DIAGNOSIS — J90 Pleural effusion, not elsewhere classified: Secondary | ICD-10-CM | POA: Diagnosis not present

## 2013-08-18 DIAGNOSIS — J9620 Acute and chronic respiratory failure, unspecified whether with hypoxia or hypercapnia: Secondary | ICD-10-CM | POA: Diagnosis present

## 2013-08-18 DIAGNOSIS — T368X5A Adverse effect of other systemic antibiotics, initial encounter: Secondary | ICD-10-CM | POA: Diagnosis not present

## 2013-08-18 DIAGNOSIS — L270 Generalized skin eruption due to drugs and medicaments taken internally: Secondary | ICD-10-CM | POA: Diagnosis not present

## 2013-08-18 DIAGNOSIS — Z930 Tracheostomy status: Secondary | ICD-10-CM

## 2013-08-18 DIAGNOSIS — A498 Other bacterial infections of unspecified site: Secondary | ICD-10-CM

## 2013-08-18 DIAGNOSIS — Z1624 Resistance to multiple antibiotics: Secondary | ICD-10-CM

## 2013-08-18 DIAGNOSIS — K921 Melena: Secondary | ICD-10-CM | POA: Diagnosis not present

## 2013-08-18 DIAGNOSIS — G9340 Encephalopathy, unspecified: Secondary | ICD-10-CM | POA: Diagnosis present

## 2013-08-18 DIAGNOSIS — J9600 Acute respiratory failure, unspecified whether with hypoxia or hypercapnia: Secondary | ICD-10-CM

## 2013-08-18 DIAGNOSIS — E8779 Other fluid overload: Secondary | ICD-10-CM | POA: Diagnosis not present

## 2013-08-18 DIAGNOSIS — R739 Hyperglycemia, unspecified: Secondary | ICD-10-CM

## 2013-08-18 DIAGNOSIS — J189 Pneumonia, unspecified organism: Secondary | ICD-10-CM

## 2013-08-18 DIAGNOSIS — K922 Gastrointestinal hemorrhage, unspecified: Secondary | ICD-10-CM | POA: Diagnosis not present

## 2013-08-18 DIAGNOSIS — S31109A Unspecified open wound of abdominal wall, unspecified quadrant without penetration into peritoneal cavity, initial encounter: Secondary | ICD-10-CM | POA: Diagnosis not present

## 2013-08-18 DIAGNOSIS — K55059 Acute (reversible) ischemia of intestine, part and extent unspecified: Secondary | ICD-10-CM

## 2013-08-18 DIAGNOSIS — J811 Chronic pulmonary edema: Secondary | ICD-10-CM

## 2013-08-18 HISTORY — DX: Disorder of kidney and ureter, unspecified: N28.9

## 2013-08-18 HISTORY — DX: Vascular disorder of intestine, unspecified: K55.9

## 2013-08-18 HISTORY — DX: Acute kidney failure, unspecified: N17.9

## 2013-08-18 HISTORY — DX: Sepsis, unspecified organism: A41.9

## 2013-08-18 HISTORY — DX: Unspecified atrial fibrillation: I48.91

## 2013-08-18 HISTORY — PX: LAPAROTOMY: SHX154

## 2013-08-18 LAB — MRSA PCR SCREENING: MRSA by PCR: NEGATIVE

## 2013-08-18 LAB — POCT I-STAT 3, ART BLOOD GAS (G3+)
Acid-base deficit: 3 mmol/L — ABNORMAL HIGH (ref 0.0–2.0)
Bicarbonate: 23 mEq/L (ref 20.0–24.0)
O2 SAT: 100 %
PH ART: 7.328 — AB (ref 7.350–7.450)
TCO2: 24 mmol/L (ref 0–100)
pCO2 arterial: 43.6 mmHg (ref 35.0–45.0)
pO2, Arterial: 188 mmHg — ABNORMAL HIGH (ref 80.0–100.0)

## 2013-08-18 LAB — COMPREHENSIVE METABOLIC PANEL
ALT: 11 U/L (ref 0–53)
AST: 16 U/L (ref 0–37)
Albumin: 1.5 g/dL — ABNORMAL LOW (ref 3.5–5.2)
Alkaline Phosphatase: 63 U/L (ref 39–117)
BUN: 94 mg/dL — ABNORMAL HIGH (ref 6–23)
CALCIUM: 7.2 mg/dL — AB (ref 8.4–10.5)
CO2: 22 mEq/L (ref 19–32)
CREATININE: 2.9 mg/dL — AB (ref 0.50–1.35)
Chloride: 98 mEq/L (ref 96–112)
GFR calc Af Amer: 24 mL/min — ABNORMAL LOW (ref >90)
GFR, EST NON AFRICAN AMERICAN: 20 mL/min — AB (ref >90)
Glucose, Bld: 190 mg/dL — ABNORMAL HIGH (ref 70–99)
Potassium: 4.3 mEq/L (ref 3.7–5.3)
Sodium: 138 mEq/L (ref 137–147)
TOTAL PROTEIN: 4.7 g/dL — AB (ref 6.0–8.3)
Total Bilirubin: 0.8 mg/dL (ref 0.3–1.2)

## 2013-08-18 LAB — CBC WITH DIFFERENTIAL/PLATELET
BASOS ABS: 0 10*3/uL (ref 0.0–0.1)
Basophils Relative: 0 % (ref 0–1)
EOS ABS: 0 10*3/uL (ref 0.0–0.7)
EOS PCT: 0 % (ref 0–5)
HCT: 32.2 % — ABNORMAL LOW (ref 39.0–52.0)
Hemoglobin: 11.1 g/dL — ABNORMAL LOW (ref 13.0–17.0)
LYMPHS ABS: 1.8 10*3/uL (ref 0.7–4.0)
Lymphocytes Relative: 3 % — ABNORMAL LOW (ref 12–46)
MCH: 30.4 pg (ref 26.0–34.0)
MCHC: 34.5 g/dL (ref 30.0–36.0)
MCV: 88.2 fL (ref 78.0–100.0)
MONO ABS: 0.6 10*3/uL (ref 0.1–1.0)
Monocytes Relative: 1 % — ABNORMAL LOW (ref 3–12)
Neutro Abs: 58.8 10*3/uL — ABNORMAL HIGH (ref 1.7–7.7)
Neutrophils Relative %: 96 % — ABNORMAL HIGH (ref 43–77)
PLATELETS: 485 10*3/uL — AB (ref 150–400)
RBC: 3.65 MIL/uL — ABNORMAL LOW (ref 4.22–5.81)
RDW: 14.6 % (ref 11.5–15.5)
WBC: 61.2 10*3/uL (ref 4.0–10.5)

## 2013-08-18 LAB — GLUCOSE, CAPILLARY
GLUCOSE-CAPILLARY: 125 mg/dL — AB (ref 70–99)
Glucose-Capillary: 139 mg/dL — ABNORMAL HIGH (ref 70–99)

## 2013-08-18 LAB — APTT: APTT: 30 s (ref 24–37)

## 2013-08-18 LAB — PROTIME-INR
INR: 1.28 (ref 0.00–1.49)
Prothrombin Time: 15.7 seconds — ABNORMAL HIGH (ref 11.6–15.2)

## 2013-08-18 LAB — CG4 I-STAT (LACTIC ACID): LACTIC ACID, VENOUS: 1.56 mmol/L (ref 0.5–2.2)

## 2013-08-18 SURGERY — LAPAROTOMY, EXPLORATORY
Anesthesia: General | Site: Abdomen

## 2013-08-18 MED ORDER — NEOSTIGMINE METHYLSULFATE 1 MG/ML IJ SOLN
INTRAMUSCULAR | Status: AC
  Filled 2013-08-18: qty 10

## 2013-08-18 MED ORDER — FENTANYL CITRATE 0.05 MG/ML IJ SOLN
100.0000 ug | INTRAMUSCULAR | Status: DC | PRN
  Administered 2013-08-18 (×2): 100 ug via INTRAVENOUS
  Filled 2013-08-18 (×3): qty 2

## 2013-08-18 MED ORDER — ROCURONIUM BROMIDE 50 MG/5ML IV SOLN
INTRAVENOUS | Status: AC
  Filled 2013-08-18: qty 2

## 2013-08-18 MED ORDER — MIDAZOLAM HCL 2 MG/2ML IJ SOLN
INTRAMUSCULAR | Status: AC
  Filled 2013-08-18: qty 2

## 2013-08-18 MED ORDER — NOREPINEPHRINE BITARTRATE 1 MG/ML IJ SOLN
2.0000 ug/min | INTRAMUSCULAR | Status: DC
  Administered 2013-08-18: 10 ug/min via INTRAVENOUS
  Filled 2013-08-18 (×2): qty 4

## 2013-08-18 MED ORDER — PROPOFOL 10 MG/ML IV BOLUS
INTRAVENOUS | Status: AC
  Filled 2013-08-18: qty 20

## 2013-08-18 MED ORDER — FENTANYL CITRATE 0.05 MG/ML IJ SOLN
INTRAMUSCULAR | Status: AC
  Filled 2013-08-18: qty 5

## 2013-08-18 MED ORDER — MIDAZOLAM HCL 5 MG/5ML IJ SOLN
INTRAMUSCULAR | Status: DC | PRN
  Administered 2013-08-18: 2 mg via INTRAVENOUS

## 2013-08-18 MED ORDER — BIOTENE DRY MOUTH MT LIQD
15.0000 mL | Freq: Four times a day (QID) | OROMUCOSAL | Status: DC
  Administered 2013-08-18 – 2013-08-27 (×36): 15 mL via OROMUCOSAL

## 2013-08-18 MED ORDER — PIPERACILLIN-TAZOBACTAM 3.375 G IVPB
3.3750 g | Freq: Three times a day (TID) | INTRAVENOUS | Status: DC
  Administered 2013-08-18: 3.375 g via INTRAVENOUS
  Filled 2013-08-18 (×3): qty 50

## 2013-08-18 MED ORDER — VASOPRESSIN 20 UNIT/ML IJ SOLN
0.0300 [IU]/min | INTRAVENOUS | Status: DC
  Administered 2013-08-18: 0.03 [IU]/min via INTRAVENOUS
  Filled 2013-08-18: qty 2.5

## 2013-08-18 MED ORDER — ARTIFICIAL TEARS OP OINT
TOPICAL_OINTMENT | OPHTHALMIC | Status: DC | PRN
  Administered 2013-08-18: 1 via OPHTHALMIC

## 2013-08-18 MED ORDER — FAT EMULSION 20 % IV EMUL
250.0000 mL | INTRAVENOUS | Status: AC
  Administered 2013-08-18: 250 mL via INTRAVENOUS
  Filled 2013-08-18: qty 250

## 2013-08-18 MED ORDER — DEXMEDETOMIDINE HCL IN NACL 200 MCG/50ML IV SOLN
INTRAVENOUS | Status: AC
  Filled 2013-08-18: qty 50

## 2013-08-18 MED ORDER — SODIUM CHLORIDE 0.9 % IV SOLN
250.0000 mL | INTRAVENOUS | Status: DC | PRN
  Administered 2013-08-18 – 2013-08-31 (×2): via INTRAVENOUS
  Administered 2013-09-01: 20 mL/h via INTRAVENOUS

## 2013-08-18 MED ORDER — ALBUTEROL SULFATE (2.5 MG/3ML) 0.083% IN NEBU: 2.5000 mg | INHALATION_SOLUTION | RESPIRATORY_TRACT | Status: DC | PRN

## 2013-08-18 MED ORDER — FENTANYL CITRATE 0.05 MG/ML IJ SOLN
INTRAMUSCULAR | Status: DC | PRN
  Administered 2013-08-18 (×2): 50 ug via INTRAVENOUS

## 2013-08-18 MED ORDER — CHLORHEXIDINE GLUCONATE 0.12 % MT SOLN
15.0000 mL | Freq: Two times a day (BID) | OROMUCOSAL | Status: DC
  Administered 2013-08-18 – 2013-08-27 (×19): 15 mL via OROMUCOSAL
  Filled 2013-08-18 (×19): qty 15

## 2013-08-18 MED ORDER — IPRATROPIUM BROMIDE 0.02 % IN SOLN: 0.5000 mg | RESPIRATORY_TRACT | Status: DC | PRN

## 2013-08-18 MED ORDER — MIDAZOLAM HCL 2 MG/2ML IJ SOLN
1.0000 mg | INTRAMUSCULAR | Status: DC | PRN
  Administered 2013-08-18 (×4): 2 mg via INTRAVENOUS
  Administered 2013-08-18: 1 mg via INTRAVENOUS
  Administered 2013-08-18 – 2013-08-19 (×6): 2 mg via INTRAVENOUS
  Filled 2013-08-18 (×11): qty 2

## 2013-08-18 MED ORDER — SODIUM CHLORIDE 0.9 % IV BOLUS (SEPSIS)
1000.0000 mL | Freq: Once | INTRAVENOUS | Status: AC
  Administered 2013-08-18: 1000 mL via INTRAVENOUS

## 2013-08-18 MED ORDER — SODIUM CHLORIDE 0.9 % IV SOLN
INTRAVENOUS | Status: DC
  Administered 2013-08-18 – 2013-08-19 (×3): via INTRAVENOUS

## 2013-08-18 MED ORDER — DEXMEDETOMIDINE HCL IN NACL 200 MCG/50ML IV SOLN
INTRAVENOUS | Status: DC | PRN
  Administered 2013-08-18: 0.7 ug/kg/h via INTRAVENOUS

## 2013-08-18 MED ORDER — SODIUM CHLORIDE 0.9 % IV SOLN
10.0000 mg | INTRAVENOUS | Status: DC | PRN
  Administered 2013-08-18: 100 ug/min via INTRAVENOUS

## 2013-08-18 MED ORDER — HYDROMORPHONE HCL PF 1 MG/ML IJ SOLN
1.0000 mg | INTRAMUSCULAR | Status: DC | PRN
  Administered 2013-08-20 – 2013-08-30 (×2): 1 mg via INTRAVENOUS
  Filled 2013-08-18 (×2): qty 1

## 2013-08-18 MED ORDER — TRACE MINERALS CR-CU-F-FE-I-MN-MO-SE-ZN IV SOLN
INTRAVENOUS | Status: AC
  Administered 2013-08-18: 18:00:00 via INTRAVENOUS
  Filled 2013-08-18: qty 2000

## 2013-08-18 MED ORDER — PHENYLEPHRINE HCL 10 MG/ML IJ SOLN
INTRAMUSCULAR | Status: AC
Start: 2013-08-18 — End: 2013-08-18
  Filled 2013-08-18: qty 1

## 2013-08-18 MED ORDER — ALBUMIN HUMAN 5 % IV SOLN
INTRAVENOUS | Status: DC | PRN
  Administered 2013-08-18 (×2): via INTRAVENOUS

## 2013-08-18 MED ORDER — INSULIN ASPART 100 UNIT/ML ~~LOC~~ SOLN
0.0000 [IU] | SUBCUTANEOUS | Status: DC
  Administered 2013-08-18: 1 [IU] via SUBCUTANEOUS
  Administered 2013-08-18: 3 [IU] via SUBCUTANEOUS
  Administered 2013-08-18: 1 [IU] via SUBCUTANEOUS

## 2013-08-18 MED ORDER — ROCURONIUM BROMIDE 50 MG/5ML IV SOLN
INTRAVENOUS | Status: AC
  Filled 2013-08-18: qty 1

## 2013-08-18 MED ORDER — PIPERACILLIN-TAZOBACTAM IN DEX 2-0.25 GM/50ML IV SOLN
2.2500 g | Freq: Three times a day (TID) | INTRAVENOUS | Status: DC
  Administered 2013-08-18 – 2013-08-29 (×33): 2.25 g via INTRAVENOUS
  Filled 2013-08-18 (×35): qty 50

## 2013-08-18 MED ORDER — PANTOPRAZOLE SODIUM 40 MG IV SOLR
40.0000 mg | Freq: Every day | INTRAVENOUS | Status: DC
  Administered 2013-08-18 – 2013-08-21 (×4): 40 mg via INTRAVENOUS
  Filled 2013-08-18 (×5): qty 40

## 2013-08-18 MED ORDER — GLYCOPYRROLATE 0.2 MG/ML IJ SOLN
INTRAMUSCULAR | Status: AC
  Filled 2013-08-18: qty 4

## 2013-08-18 MED ORDER — 0.9 % SODIUM CHLORIDE (POUR BTL) OPTIME
TOPICAL | Status: DC | PRN
  Administered 2013-08-18: 2000 mL

## 2013-08-18 MED ORDER — FENTANYL CITRATE 0.05 MG/ML IJ SOLN
50.0000 ug | INTRAMUSCULAR | Status: DC | PRN
  Administered 2013-08-18 – 2013-08-27 (×33): 100 ug via INTRAVENOUS
  Filled 2013-08-18 (×33): qty 2

## 2013-08-18 MED ORDER — NOREPINEPHRINE BITARTRATE 1 MG/ML IJ SOLN
2.0000 ug/min | INTRAVENOUS | Status: DC
  Administered 2013-08-18: 5 ug/min via INTRAVENOUS
  Filled 2013-08-18: qty 16

## 2013-08-18 MED ORDER — SODIUM CHLORIDE 0.9 % IJ SOLN
10.0000 mL | INTRAMUSCULAR | Status: DC | PRN
  Administered 2013-08-18 – 2013-08-26 (×10): 10 mL

## 2013-08-18 MED ORDER — ROCURONIUM BROMIDE 100 MG/10ML IV SOLN
INTRAVENOUS | Status: DC | PRN
  Administered 2013-08-18 (×3): 50 mg via INTRAVENOUS

## 2013-08-18 SURGICAL SUPPLY — 50 items
BLADE SURG ROTATE 9660 (MISCELLANEOUS) IMPLANT
CANISTER SUCTION 2500CC (MISCELLANEOUS) ×3 IMPLANT
CHLORAPREP W/TINT 26ML (MISCELLANEOUS) ×3 IMPLANT
COVER MAYO STAND STRL (DRAPES) ×3 IMPLANT
COVER SURGICAL LIGHT HANDLE (MISCELLANEOUS) ×3 IMPLANT
DRAPE LAPAROSCOPIC ABDOMINAL (DRAPES) ×3 IMPLANT
DRAPE PROXIMA HALF (DRAPES) ×3 IMPLANT
DRAPE UTILITY 15X26 W/TAPE STR (DRAPE) ×6 IMPLANT
DRAPE WARM FLUID 44X44 (DRAPE) ×3 IMPLANT
DRESSING ALLEVYN LIFE SACRUM (GAUZE/BANDAGES/DRESSINGS) ×3 IMPLANT
DRSG OPSITE POSTOP 4X10 (GAUZE/BANDAGES/DRESSINGS) IMPLANT
DRSG OPSITE POSTOP 4X8 (GAUZE/BANDAGES/DRESSINGS) IMPLANT
DRSG VAC ATS LRG SENSATRAC (GAUZE/BANDAGES/DRESSINGS) ×3 IMPLANT
ELECT BLADE 6.5 EXT (BLADE) IMPLANT
ELECT CAUTERY BLADE 6.4 (BLADE) ×3 IMPLANT
ELECT REM PT RETURN 9FT ADLT (ELECTROSURGICAL) ×3
ELECTRODE REM PT RTRN 9FT ADLT (ELECTROSURGICAL) ×1 IMPLANT
GLOVE BIO SURGEON STRL SZ8 (GLOVE) ×3 IMPLANT
GLOVE BIOGEL PI IND STRL 7.0 (GLOVE) ×2 IMPLANT
GLOVE BIOGEL PI IND STRL 7.5 (GLOVE) ×1 IMPLANT
GLOVE BIOGEL PI IND STRL 8 (GLOVE) ×1 IMPLANT
GLOVE BIOGEL PI INDICATOR 7.0 (GLOVE) ×4
GLOVE BIOGEL PI INDICATOR 7.5 (GLOVE) ×2
GLOVE BIOGEL PI INDICATOR 8 (GLOVE) ×2
GOWN STRL NON-REIN LRG LVL3 (GOWN DISPOSABLE) ×6 IMPLANT
GOWN STRL REIN XL XLG (GOWN DISPOSABLE) ×6 IMPLANT
KIT BASIN OR (CUSTOM PROCEDURE TRAY) ×3 IMPLANT
KIT ROOM TURNOVER OR (KITS) ×3 IMPLANT
LIGASURE IMPACT 36 18CM CVD LR (INSTRUMENTS) ×3 IMPLANT
NS IRRIG 1000ML POUR BTL (IV SOLUTION) ×6 IMPLANT
PACK GENERAL/GYN (CUSTOM PROCEDURE TRAY) ×3 IMPLANT
PAD ARMBOARD 7.5X6 YLW CONV (MISCELLANEOUS) ×6 IMPLANT
PENCIL BUTTON HOLSTER BLD 10FT (ELECTRODE) IMPLANT
RELOAD PROXIMATE 75MM BLUE (ENDOMECHANICALS) ×9 IMPLANT
SPECIMEN JAR LARGE (MISCELLANEOUS) IMPLANT
SPONGE ABDOMINAL VAC ABTHERA (MISCELLANEOUS) ×3 IMPLANT
SPONGE LAP 18X18 X RAY DECT (DISPOSABLE) ×9 IMPLANT
STAPLER PROXIMATE 75MM BLUE (STAPLE) ×3 IMPLANT
STAPLER VISISTAT 35W (STAPLE) ×3 IMPLANT
SUCTION POOLE TIP (SUCTIONS) ×3 IMPLANT
SUT VIC AB 2-0 SH 18 (SUTURE) ×3 IMPLANT
SUT VIC AB 3-0 SH 18 (SUTURE) ×3 IMPLANT
SUT VICRYL AB 2 0 TIES (SUTURE) ×3 IMPLANT
SUT VICRYL AB 3 0 TIES (SUTURE) ×3 IMPLANT
TOWEL OR 17X24 6PK STRL BLUE (TOWEL DISPOSABLE) ×3 IMPLANT
TOWEL OR 17X26 10 PK STRL BLUE (TOWEL DISPOSABLE) ×3 IMPLANT
TRAY FOLEY CATH 16FRSI W/METER (SET/KITS/TRAYS/PACK) ×3 IMPLANT
TUBE CONNECTING 12'X1/4 (SUCTIONS)
TUBE CONNECTING 12X1/4 (SUCTIONS) IMPLANT
YANKAUER SUCT BULB TIP NO VENT (SUCTIONS) IMPLANT

## 2013-08-18 NOTE — Anesthesia Postprocedure Evaluation (Signed)
  Anesthesia Post-op Note  Patient: Jason Harmon  Procedure(s) Performed: Procedure(s): EXPLORATORY LAPAROTOMY (N/A)  Patient Location: PACU and ICU  Anesthesia Type:General  Level of Consciousness: Patient remains intubated per anesthesia plan  Airway and Oxygen Therapy: Patient placed on Ventilator (see vital sign flow sheet for setting)  Post-op Pain: mild  Post-op Assessment: Post-op Vital signs reviewed  Post-op Vital Signs: Reviewed  Complications: No apparent anesthesia complications

## 2013-08-18 NOTE — Progress Notes (Signed)
Spoke with Dr. Brantley Stage concerning wound vac output 500 ml. Advised to continue monitoring.

## 2013-08-18 NOTE — Progress Notes (Signed)
Patient now with bowel perforation and surgery involved. Gastric tube procedure cancelled.   Tsosie Billing PA-C Interventional Radiology  08/18/13  9:51 AM

## 2013-08-18 NOTE — Anesthesia Procedure Notes (Signed)
Date/Time: 08/18/2013 7:53 AM Performed by: Neldon Newport Pre-anesthesia Checklist: Patient identified, Timeout performed, Emergency Drugs available, Suction available and Patient being monitored Patient Re-evaluated:Patient Re-evaluated prior to inductionOxygen Delivery Method: Circle system utilized Preoxygenation: Pre-oxygenation with 100% oxygen Intubation Type: Combination inhalational/ intravenous induction and Tracheostomy Tube type: Anesthesia circuit connected to patient's tracheostomy tube. Placement Confirmation: positive ETCO2 and breath sounds checked- equal and bilateral Dental Injury: Teeth and Oropharynx as per pre-operative assessment

## 2013-08-18 NOTE — Op Note (Signed)
Preoperative diagnosis: Perforated viscus with diffuse peritonitis and septic shock  Postoperative diagnosis: Same with perforation of small bowel anastomosis into sigmoid colon and peritoneum with 4 L and of succus entericus and stool in peritoneal cavity   Procedure: Exploratory laparotomy with segmental resection of small bowel anastomosis and sigmoid colon with placement of abdominal wound vac  Surgeon: Erroll Luna M.D.  Anesthesia: Gen. Endotracheal  EBL: 200 cc with 4 L of succus entericus and stool  IV fluids: 2 L crystalloid and 500 cc albumin  Drains none  Specimens: Small bowel anastomosis and sigmoid colon  Indications for procedure: The patient is a 73 year old male who was transferred to the emergency room from [facility] for increasing white count, abdominal pain and declining clinical status. Patient underwent small bowel resection at [facility] approximately 3 weeks ago for small bowel obstruction due to adhesion. This he required tracheostomy and dialysis for multiple organ failure and was admitted to [facility] a prolonged care. His clinical course declined yesterday which prompted CT scanning of the abdomen which showed free fluid or extravasation of contrast at his anastomosis. Family was contacted by Dr. Donne Hazel discussed his condition. They wish to be aggressive in his care even though likelihood of death with intervention he is at least 32-44% and complication rates of surgery or 100%. Conservative care another option discussed but they wish to proceed with all attempts to preserve his life. Risk of bleeding, infection, death, worsening of medical problems, open abdomen, fistula formation, the need for multiple other operations, organ injury, bowel resection, DVT, and long term complications such as dependence upon skilled nursing facility to exist.  Description of procedure: Patient was brought emergently to the operating room. Patient had  pre-existing tracheostomy where general anesthesia was initiated. His abdomen was prepped and draped in a sterile fashion timeout was done. He was already on antibiotics. Previous surgical staples removed. Vicryl fascial sutures identified and cut.  Abdominal cavity was entered and 4 L of bilious and stool like material suctioned. Retractor was in place. There were very few adhesions. Small bowel was run from ligament of Treitz into the pelvis where large phlegmon that was leaking bilious fluid and stool was identified in the left lower quadrant. I was able to separate this out by finger fracture technique which showed a small bowel anastomosis densely adherent to the sigmoid colon with perforation. It appeared small bowel anastomosis  Stuck down to sigmoid colon causing perforation of each. I oversewed the colonic perforation to temporarily control contamination. I mobilized sigmoid colon along the white line of T[place]t. Left ureter identified and preserved. Given the amount of contamination and peritonitis, I felt that resection of both small bowel anastomosis and sigmoid colon necessary. I felt that vacuum pack closure best given the amount of contamination and his medical condition. GIA 75 stapling device fired proximal to the colonic perforation in mid sigmoid colon and then re\re fired distal to that to resect this segment. We then measured from ligament of Treitz to the anastomosis  And this was approximately 100 cc of small bowel. G. I a 75 stapling device fired proximal to small bowel anastomosis and just distal to the LigaSure to take down the mesentery and resect this. Both specimens are labeled and passed off the field. No anastomosis was attempted do to significant contamination. 3-0 Vicryl was used to secure the small bowel staple lines to each other. Additional 3-0 Vicryl suture placed in descending colon for identification purposes. 10 L of  saline used to irrigate out the abdominal wall and cavity.  Vacuum pack dressing place to suction. All final counts of sponges, needles and instruments found to be correct at this point. Patient taken to ICU in critical condition on a ventilator

## 2013-08-18 NOTE — H&P (Signed)
PULMONARY  / CRITICAL CARE MEDICINE HISTORY AND PHYSICAL EXAMINATION  Name: Jason Harmon MRN: [id number] DOB: 20-Jul-1940    ADMISSION DATE:  08/18/2013  CHIEF COMPLAINT:  Bowel perforation  BRIEF PATIENT DESCRIPTION:  73 yo male with SBO/ischemic bowel s/p resection with subsequent complicated medical course including respiratory failure and inability to wean from ventilator. He is now admitted with bowel perforation at anastomosis site.  SIGNIFICANT EVENTS / STUDIES:  1. Tracheostomy 1/22 2. CT A/P with contrast 08/17/2013 - bowel perforation, likely anastomotic breakdown at proximal enteroenterostomy with pneumoperitoneum and spilling of oral contrast, moderate ascites with peritonitis  LINES / TUBES: 1. Tracheostomy 1/22>>> 2. NG tube 3. R IJ HD catheter 1/12>>>  CULTURES: 1. Blood cultures 08/17/2013  ANTIBIOTICS: 1. Zosyn 08/18/2013  HISTORY OF PRESENT ILLNESS:   73 yo male with renal failure on dialysis and multiple recent surgeries for ischemic bowel presenting from [facility] with rigid abdomen and CT demonstrating bowel perforation. He was evaluated by surgery, Dr. Donne Hazel and after discussion with family, the plan is for the patient to go to OR this morning for attempted bowel repair. He has had a very complicated course over the past month when he initially presented to an OSH 1/9 with SBO/ischemic bowel requiring laparotomy and small bowel resection. His course was complicated by ARF, now on dialysis and respiratory failure resulting in tracheostomy and prolonged mechanical ventilation.  PAST MEDICAL HISTORY :  Past Medical History  Diagnosis Date  . Renal disorder   . A-fib   . Sepsis   . Ischemic bowel disease   . Acute renal failure     Past Surgical History  Procedure Laterality Date  . Abdominal surgery      Prior to Admission medications   Not on File  It is unclear what medications he has been on recently.  No Known Allergies  FAMILY HISTORY: unable  to obtain No family history on file.  SOCIAL HISTORY: unable to obtain  has no tobacco, alcohol, and drug history on file.  REVIEW OF SYSTEMS:  Unable to obtain secondary to patient's clinical status  PHYSICAL EXAM  VITAL SIGNS: Pulse Rate:  [112] 112 (02/01 0633) Resp:  [37-43] 37 (02/01 0633) BP: (109-112)/(83-89) 109/83 mmHg (02/01 0633) SpO2:  [99 %] 99 % (02/01 0633) FiO2 (%):  [40 %] 40 % (02/01 0315)  HEMODYNAMICS:    VENTILATOR SETTINGS: Vent Mode:  [-] PCV FiO2 (%):  [40 %] 40 % Set Rate:  [30 bmp] 30 bmp PEEP:  [8 cmH20] 8 cmH20 Plateau Pressure:  [22 cmH20] 22 cmH20  INTAKE / OUTPUT: Intake/Output   None     PHYSICAL EXAMINATION: General:  Chronically ill-appearing, no acute distress, anasarca Neuro:  Obtunded HEENT:  AT, [place], NG tube in place, dry mm Neck:  Tracheostomy in place Cardiovascular:  Tachycardic, no murmurs/rubs/gallops Lungs:  Coarse breath sounds anteriorly Abdomen:  distended Musculoskeletal:  No clubbing or cyanosis Skin:  No rash  LABS:  CBC Recent Labs     08/17/13  0610  08/17/13  1125  08/18/13  0404  WBC  40.9*  41.2*  61.2*  HGB  10.0*  10.0*  11.1*  HCT  29.2*  29.3*  32.2*  PLT  500*  451*  485*    Coag's Recent Labs     08/18/13  0404  APTT  30  INR  1.28    BMET Recent Labs     08/17/13  0610  08/17/13  1125  08/18/13  0404  NA  141  137  138  K  3.7  4.0  4.3  CL  99  97  98  CO2  24  22  22   BUN  73*  77*  94*  CREATININE  2.39*  2.48*  2.90*  GLUCOSE  288*  337*  190*    Electrolytes Recent Labs     08/16/13  0635  08/17/13  0610  08/17/13  1125  08/18/13  0404  CALCIUM  7.0*  7.1*  6.9*  7.2*  PHOS  8.0*   --    --    --     Sepsis Markers Recent Labs     08/17/13  1125  PROCALCITON  0.95    ABG No results found for this basename: PHART, PCO2ART, PO2ART,  in the last 72 hours  Liver Enzymes Recent Labs     08/16/13  0635  08/17/13  1125  08/18/13  0404  AST   --   20  16   ALT   --   11  11  ALKPHOS   --   60  63  BILITOT   --   0.6  0.8  ALBUMIN  1.5*  1.5*  1.5*    Cardiac Enzymes No results found for this basename: TROPONINI, PROBNP,  in the last 72 hours  Glucose No results found for this basename: GLUCAP,  in the last 72 hours  Imaging Ct Abdomen Pelvis W Contrast  08/17/2013   CLINICAL DATA:  Check for abscess. History of necrotic bowel. Recent transfer tall mm regional.  EXAM: CT ABDOMEN AND PELVIS WITH CONTRAST  TECHNIQUE: Multidetector CT imaging of the abdomen and pelvis was performed using the standard protocol following bolus administration of intravenous contrast.  CONTRAST:  75mL OMNIPAQUE IOHEXOL 300 MG/ML  SOLN  COMPARISON:  August 08, 2013  FINDINGS: BODY WALL: Anasarca.  LOWER CHEST: Moderate, layering, water density pleural effusions with passive atelectasis. Despite a well-positioned nasogastric tube, there is gastroesophageal reflux into the lower esophagus at least.  ABDOMEN/PELVIS:  Liver: No focal abnormality.  Biliary: Vicarious excretion of contrast into the gallbladder.  Pancreas: Unremarkable.  Spleen: Unremarkable.  Adrenals: Unremarkable.  Kidneys and ureters: No hydronephrosis. Symmetric perinephric edema. Two right renal cysts, 17 mm in the anterior hilar lip.  Bladder: Decompressed by Foley catheter.  Reproductive: Unremarkable.  Bowel: Status post small bowel resection with enteroenterostomy x2. At the more proximal anastomosis, there is extravasation of oral contrast. Interval development of moderate pneumoperitoneum scattered throughout the abdomen. Moderate volume of ascites without definite loculated collection. There is likely some complexity to the peritoneal fluid in the ventral left lower quadrant, which can be visualized at the time of surgery. No bowel obstruction. Normal appendix.  Retroperitoneum: No mass or adenopathy.  Peritoneum: As above. Peritoneal enhancement in the lower abdomen consistent with peritonitis.   Vascular: No acute abnormality.  OSSEOUS: No acute abnormalities.  Critical Value/emergent results were called by telephone at the time of interpretation on 08/17/2013 at 10:24 PM to Dr. Pleas Patricia , who verbally acknowledged these results.  IMPRESSION: 1. Bowel perforation, likely anastomotic breakdown at the proximal enteroenterostomy. There is pneumoperitoneum and spilling of oral contrast. 2. Moderate ascites with peritonitis.  No abscess. 3. Proximal small bowel fold thickening which could be reactive to recent surgery or from ongoing bowel ischemia.   Electronically Signed   By: Jorje Guild M.D.   On: 08/17/2013 22:29   Dg Chest Port 1 View  08/17/2013  CLINICAL DATA:  Tachycardia. Acute respiratory failure following trauma and surgery.  EXAM: PORTABLE CHEST - 1 VIEW  COMPARISON:  08/14/2013  FINDINGS: Mild improvement in bilateral airspace disease is seen since prior exam. Low lung volumes again noted. No pneumothorax identified. Support lines and tubes in appropriate position.  IMPRESSION: Mild improvement in diffuse bilateral airspace disease.   Electronically Signed   By: Earle Gell M.D.   On: 08/17/2013 10:13   Dg Abd Portable 1v  08/17/2013   CLINICAL DATA:  Increased residuals  EXAM: PORTABLE ABDOMEN - 1 VIEW  COMPARISON:  08/12/2013  FINDINGS: Nasogastric tube remains in the stomach, which is moderately distended. Small bowel decompressed. Colon nondilated. Midline abdominal staples.  IMPRESSION: 1. Mild gastric distention despite presence of nasogastric tube.   Electronically Signed   By: Arne Cleveland M.D.   On: 08/17/2013 10:16    ASSESSMENT / PLAN: Active Problems:   Bowel perforation   PULMONARY A/P: 1. Chronic respiratory failure: s/p tracheostomy 1/22  Wean ventilator settings, pressure support as tolerated  Continue to monitor ABG  CARDIOVASCULAR A/P:  1. Tachycardia, likely related to sepsis, not currently requiring pressors  Continue to monitor closely 2.    H/o atrial fibrillation, review of past notes suggests he has been on amiodarone, will need to continue post-op  RENAL A/P:  1. Renal failure, now dialysis dependent  He will require renal consult post-op for dialysis  Monitor electrolytes  Renally dose medications  GASTROINTESTINAL A/P:  1. Bowel perforation  Plan for OR this morning to attempt repair  HEMATOLOGIC A/P:   1. DVT prophylaxis: hold off for now given plans for OR 2. Leukocytosis, likely related to bowel perforation, continue to monitor  INFECTIOUS A/P: 1. Sepsis secondary to bowel perforation  Blood cultures pending  Currently receiving Zosyn  ENDOCRINE A/P: 1. No acute issues at this time  NEUROLOGIC A/P: 1. Encephalopathy, unclear what his baseline mental status is  Avoid sedating medications as able  Monitor closely post-operatively  BEST PRACTICE / DISPOSITION Level of Care:  ICU Consultants:  Surgery, will need Renal consult and Nutrition consult for possible TPN Code Status:  Full Diet:  NPO, per [facility] paperwork may have been on TPN DVT Px:  None due to planned procedure GI Px:  Pantoprazole IV Skin Integrity:  Surgical wounds present Social / Family:  No family present   I have personally obtained a history, examined the patient, evaluated laboratory and imaging results, formulated the assessment and plan and placed orders.  CRITICAL CARE: The patient is critically ill with multiple organ systems failure and requires high complexity decision making for assessment and support, frequent evaluation and titration of therapies, application of advanced monitoring technologies and extensive interpretation of multiple databases. Critical Care Time devoted to patient care services described in this note is 60 minutes.   Maricela Curet, MD Pulmonary and [place] Pager: [phone]  08/18/2013, 7:02 AM

## 2013-08-18 NOTE — ED Notes (Signed)
Dr Donne Hazel notified that OP notes were brought down from [facility].  Paperwork placed in pt's room with other paperwork per Dr Cristal Generous request.

## 2013-08-18 NOTE — ED Notes (Signed)
IV team to come change [place] caps.

## 2013-08-18 NOTE — ED Notes (Signed)
Pt from [facility].  Pt is s/p bowel resection.  SS RN states that pt's abdomen became rigid.  Pt was sent to CT and it demonstrated a bowel perforation.  Pt sent to ED for further eval.  Per [facility] paperwork, Dr Donne Hazel is accepting physician.

## 2013-08-18 NOTE — Preoperative (Signed)
Beta Blockers   Reason not to administer Beta Blockers:Not Applicable 

## 2013-08-18 NOTE — Progress Notes (Signed)
Spoke with Dr. Nelda Marseille concerning patient arrival from OR orders received.  Will continue to closely monitor patient.

## 2013-08-18 NOTE — Progress Notes (Addendum)
ANTIBIOTIC CONSULT NOTE - INITIAL  Pharmacy Consult for Zosyn Indication: Perforated viscous with diffuse peritonitis  No Known Allergies  Patient Measurements:    Vital Signs: Temp: 98.1 F (36.7 C) (02/01 1012) Temp src: Axillary (02/01 1012) BP: 99/62 mmHg (02/01 1045) Pulse Rate: 76 (02/01 1015) Intake/Output from previous day:   Intake/Output from this shift: Total I/O In: 1000 [I.V.:500; IV Piggyback:500] Out: 4025 [Urine:25; Other:4000]  Labs:  Recent Labs  08/17/13 0610 08/17/13 1125 08/18/13 0404  WBC 40.9* 41.2* 61.2*  HGB 10.0* 10.0* 11.1*  PLT 500* 451* 485*  CREATININE 2.39* 2.48* 2.90*   CrCl is unknown because there is no height on file for the current visit. No results found for this basename: VANCOTROUGH, Corlis Leak, VANCORANDOM, [place], GENTPEAK, [place], [place], [place], TOBRARND, AMIKACINPEAK, AMIKACINTROU, AMIKACIN,  in the last 72 hours   Microbiology: Recent Results (from the past 720 hour(s))  CULTURE, BLOOD (ROUTINE X 2)     Status: None   Collection Time    08/07/13 10:00 PM      Result Value Range Status   Specimen Description BLOOD ARTERIAL   Final   Special Requests BOTTLES DRAWN AEROBIC ONLY 5CC   Final   Culture  Setup Time     Final   Value: 08/08/2013 00:45     Performed at [place]     Final   Value: NO GROWTH 5 DAYS     Performed at [facility]   Report Status 08/14/2013 FINAL   Final  CULTURE, BLOOD (ROUTINE X 2)     Status: None   Collection Time    08/07/13 10:10 PM      Result Value Range Status   Specimen Description BLOOD LEFT HAND   Final   Special Requests BOTTLES DRAWN AEROBIC ONLY [place]   Final   Culture  Setup Time     Final   Value: 08/08/2013 00:45     Performed at [facility]   Culture     Final   Value: NO GROWTH 5 DAYS     Performed at [facility]   Report Status 08/14/2013 FINAL   Final    Medical History: Past Medical History  Diagnosis  Date  . Renal disorder   . A-fib   . Sepsis   . Ischemic bowel disease   . Acute renal failure     Medications:  Awaiting medication reconciliation  Assessment: 73 yo male with SBO/ischemic bowel s/p resection with subsequent complicated medical course including respiratory failure and inability to wean from ventilator. He is now admitted from [facility] with bowel perforation at anastomosis site. Zosyn started this a.m. (Day #1) for sepsis due to perforated viscous with peritonitis. To OR emergently for exp lap with segmental resection of small bowel anastomosis and sigmoid colon with placement of abd wound vac.  Noted pt with ARF - receiving HD at [facility].   Goal of Therapy:  Eradication of infection  Plan:  1. Change Zosyn to 2.25gm IV q8h  Sherlon Handing, PharmD, BCPS Clinical pharmacist, pager [phone] 08/18/2013,10:59 AM   2/2 Pharmacy to sign off Re-consult if needed for antibiotic dosing (following for TNA)  Thank you. Anette Guarneri, PharmD

## 2013-08-18 NOTE — Progress Notes (Signed)
Called by bedside RN.  Patient's BP deteriorating.  Abdominal sepsis evident.  Case reviewed, family wishes for full code and active interventions.  Will hydrate.  Follow CVP.  Start levophed.  Additional CC time 30 minutes.  Rush Farmer, M.D. [facility] Pulmonary/Critical Care Medicine. Pager: [phone]. After hours pager: [phone].

## 2013-08-18 NOTE — Anesthesia Preprocedure Evaluation (Addendum)
Anesthesia Evaluation  Patient identified by MRN, date of birth, ID band Patient unresponsive    Airway      Comment: To Or with trach  Dental   Pulmonary pneumonia -,  Trach on vent         Cardiovascular + dysrhythmias Atrial Fibrillation     Neuro/Psych    GI/Hepatic Gi history noted   Endo/Other    Renal/GU ARFRenal disease     Musculoskeletal   Abdominal   Peds  Hematology   Anesthesia Other Findings   Reproductive/Obstetrics                          Anesthesia Physical Anesthesia Plan  ASA: IV and emergent  Anesthesia Plan:    Post-op Pain Management:    Induction:   Airway Management Planned: Oral ETT  Additional Equipment: CVP  Intra-op Plan:   Post-operative Plan: Post-operative intubation/ventilation  Informed Consent:   Plan Discussed with: Anesthesiologist, CRNA and Surgeon  Anesthesia Plan Comments:         Anesthesia Quick Evaluation

## 2013-08-18 NOTE — Consult Note (Addendum)
Reason for Consult:perforation prior anastomosis Referring Physician: Dr Malachy Moan  Jason Harmon is an 73 y.o. male.  HPI: 78 yom who comes from [facility] down to er.  He is intubated and obtunded.  No family are present currently.  By the records brought down he apparently presented to outside hospital 1/9 with what appeared to end up being a sbo or ischemic bowel that required a laparotomy and small bowel resection.  He had ARF and ARDS postoperatively (possibly TRALI from notes) and was transferred to [facility] for care.  He has remained ventilated since he arrived there followed by CCM. Looks like he was scheduled to have a g tube placed on Monday.  Over the last couple days he appears to have progressively gotten sicker.  He is getting dialyzed per the notes from [facility] and has a right IJ hd catheter in place.  He underwent a ct today that shows what appears to be contrast extravasation from a sb-sb anastomosis and is transferred to the er for evaluation. All of this history comes from [facility] paperwork I have received and ccm notes. I do not currently have op reports so am not really sure about what surgery he had.    Past Medical History  Diagnosis Date  . Renal disorder   . A-fib   . Sepsis   . Ischemic bowel disease   . Acute renal failure     Past Surgical History  Procedure Laterality Date  . Abdominal surgery      No family history on file.  Social History:  has no tobacco, alcohol, and drug history on file.  Allergies: No Known Allergies  Medications: I have reviewed the patient's current medications.    Results for orders placed during the hospital encounter of 08/06/13 (from the past 48 hour(s))  CBC     Status: Abnormal   Collection Time    08/16/13  6:35 AM      Result Value Range   WBC 25.5 (*) 4.0 - 10.5 K/uL   RBC 2.87 (*) 4.22 - 5.81 MIL/uL   Hemoglobin 8.7 (*) 13.0 - 17.0 g/dL   HCT 25.1 (*) 39.0 - 52.0 %   MCV 87.5  78.0 - 100.0 fL   MCH 30.3  26.0 - 34.0  pg   MCHC 34.7  30.0 - 36.0 g/dL   RDW 14.4  11.5 - 15.5 %   Platelets 494 (*) 150 - 400 K/uL  RENAL FUNCTION PANEL     Status: Abnormal   Collection Time    08/16/13  6:35 AM      Result Value Range   Sodium 140  137 - 147 mEq/L   Potassium 3.7  3.7 - 5.3 mEq/L   Chloride 97  96 - 112 mEq/L   CO2 22  19 - 32 mEq/L   Glucose, Bld 363 (*) 70 - 99 mg/dL   BUN 104 (*) 6 - 23 mg/dL   Creatinine, Ser 3.42 (*) 0.50 - 1.35 mg/dL   Calcium 7.0 (*) 8.4 - 10.5 mg/dL   Phosphorus 8.0 (*) 2.3 - 4.6 mg/dL   Albumin 1.5 (*) 3.5 - 5.2 g/dL   GFR calc non Af Amer 17 (*) >90 mL/min   GFR calc Af Amer 19 (*) >90 mL/min   Comment: (NOTE)     The eGFR has been calculated using the CKD EPI equation.     This calculation has not been validated in all clinical situations.     eGFR's persistently <90 mL/min  signify possible Chronic Kidney     Disease.  CBC     Status: Abnormal   Collection Time    08/17/13  6:10 AM      Result Value Range   WBC 40.9 (*) 4.0 - 10.5 K/uL   RBC 3.29 (*) 4.22 - 5.81 MIL/uL   Hemoglobin 10.0 (*) 13.0 - 17.0 g/dL   HCT 29.2 (*) 39.0 - 52.0 %   MCV 88.8  78.0 - 100.0 fL   MCH 30.4  26.0 - 34.0 pg   MCHC 34.2  30.0 - 36.0 g/dL   RDW 14.7  11.5 - 15.5 %   Platelets 500 (*) 150 - 400 K/uL  BASIC METABOLIC PANEL     Status: Abnormal   Collection Time    08/17/13  6:10 AM      Result Value Range   Sodium 141  137 - 147 mEq/L   Potassium 3.7  3.7 - 5.3 mEq/L   Chloride 99  96 - 112 mEq/L   CO2 24  19 - 32 mEq/L   Glucose, Bld 288 (*) 70 - 99 mg/dL   BUN 73 (*) 6 - 23 mg/dL   Creatinine, Ser 2.39 (*) 0.50 - 1.35 mg/dL   Calcium 7.1 (*) 8.4 - 10.5 mg/dL   GFR calc non Af Amer 26 (*) >90 mL/min   GFR calc Af Amer 30 (*) >90 mL/min   Comment: (NOTE)     The eGFR has been calculated using the CKD EPI equation.     This calculation has not been validated in all clinical situations.     eGFR's persistently <90 mL/min signify possible Chronic Kidney     Disease.  CBC      Status: Abnormal   Collection Time    08/17/13 11:25 AM      Result Value Range   WBC 41.2 (*) 4.0 - 10.5 K/uL   RBC 3.28 (*) 4.22 - 5.81 MIL/uL   Hemoglobin 10.0 (*) 13.0 - 17.0 g/dL   HCT 29.3 (*) 39.0 - 52.0 %   MCV 89.3  78.0 - 100.0 fL   MCH 30.5  26.0 - 34.0 pg   MCHC 34.1  30.0 - 36.0 g/dL   RDW 14.7  11.5 - 15.5 %   Platelets 451 (*) 150 - 400 K/uL  BASIC METABOLIC PANEL     Status: Abnormal   Collection Time    08/17/13 11:25 AM      Result Value Range   Sodium 137  137 - 147 mEq/L   Potassium 4.0  3.7 - 5.3 mEq/L   Chloride 97  96 - 112 mEq/L   CO2 22  19 - 32 mEq/L   Glucose, Bld 337 (*) 70 - 99 mg/dL   BUN 77 (*) 6 - 23 mg/dL   Creatinine, Ser 2.48 (*) 0.50 - 1.35 mg/dL   Calcium 6.9 (*) 8.4 - 10.5 mg/dL   GFR calc non Af Amer 25 (*) >90 mL/min   GFR calc Af Amer 29 (*) >90 mL/min   Comment: (NOTE)     The eGFR has been calculated using the CKD EPI equation.     This calculation has not been validated in all clinical situations.     eGFR's persistently <90 mL/min signify possible Chronic Kidney     Disease.  HEPATIC FUNCTION PANEL     Status: Abnormal   Collection Time    08/17/13 11:25 AM      Result Value Range  Total Protein 4.6 (*) 6.0 - 8.3 g/dL   Albumin 1.5 (*) 3.5 - 5.2 g/dL   AST 20  0 - 37 U/L   ALT 11  0 - 53 U/L   Alkaline Phosphatase 60  39 - 117 U/L   Total Bilirubin 0.6  0.3 - 1.2 mg/dL   Bilirubin, Direct 0.3  0.0 - 0.3 mg/dL   Indirect Bilirubin 0.3  0.3 - 0.9 mg/dL  LACTIC ACID, PLASMA     Status: Abnormal   Collection Time    08/17/13 11:25 AM      Result Value Range   Lactic Acid, Venous 2.5 (*) 0.5 - 2.2 mmol/L  PROCALCITONIN     Status: None   Collection Time    08/17/13 11:25 AM      Result Value Range   Procalcitonin 0.95     Comment:            Interpretation:     PCT > 0.5 ng/mL and <= 2 ng/mL:     Systemic infection (sepsis) is possible,     but other conditions are known to elevate     PCT as well.     (NOTE)              ICU PCT Algorithm               Non ICU PCT Algorithm        ----------------------------     ------------------------------             PCT < 0.25 ng/mL                 PCT < 0.1 ng/mL         Stopping of antibiotics            Stopping of antibiotics           strongly encouraged.               strongly encouraged.        ----------------------------     ------------------------------           PCT level decrease by               PCT < 0.25 ng/mL           >= 80% from peak PCT           OR PCT 0.25 - 0.5 ng/mL          Stopping of antibiotics                                                 encouraged.         Stopping of antibiotics               encouraged.        ----------------------------     ------------------------------           PCT level decrease by              PCT >= 0.25 ng/mL           < 80% from peak PCT            AND PCT >= 0.5 ng/mL            Continuing antibiotics  encouraged.           Continuing antibiotics                encouraged.        ----------------------------     ------------------------------         PCT level increase compared          PCT > 0.5 ng/mL             with peak PCT AND              PCT >= 0.5 ng/mL             Escalation of antibiotics                                              strongly encouraged.          Escalation of antibiotics            strongly encouraged.    Ct Abdomen Pelvis W Contrast  08/17/2013   CLINICAL DATA:  Check for abscess. History of necrotic bowel. Recent transfer tall mm regional.  EXAM: CT ABDOMEN AND PELVIS WITH CONTRAST  TECHNIQUE: Multidetector CT imaging of the abdomen and pelvis was performed using the standard protocol following bolus administration of intravenous contrast.  CONTRAST:  29mL OMNIPAQUE IOHEXOL 300 MG/ML  SOLN  COMPARISON:  August 08, 2013  FINDINGS: BODY WALL: Anasarca.  LOWER CHEST: Moderate, layering, water density pleural effusions with  passive atelectasis. Despite a well-positioned nasogastric tube, there is gastroesophageal reflux into the lower esophagus at least.  ABDOMEN/PELVIS:  Liver: No focal abnormality.  Biliary: Vicarious excretion of contrast into the gallbladder.  Pancreas: Unremarkable.  Spleen: Unremarkable.  Adrenals: Unremarkable.  Kidneys and ureters: No hydronephrosis. Symmetric perinephric edema. Two right renal cysts, 17 mm in the anterior hilar lip.  Bladder: Decompressed by Foley catheter.  Reproductive: Unremarkable.  Bowel: Status post small bowel resection with enteroenterostomy x2. At the more proximal anastomosis, there is extravasation of oral contrast. Interval development of moderate pneumoperitoneum scattered throughout the abdomen. Moderate volume of ascites without definite loculated collection. There is likely some complexity to the peritoneal fluid in the ventral left lower quadrant, which can be visualized at the time of surgery. No bowel obstruction. Normal appendix.  Retroperitoneum: No mass or adenopathy.  Peritoneum: As above. Peritoneal enhancement in the lower abdomen consistent with peritonitis.  Vascular: No acute abnormality.  OSSEOUS: No acute abnormalities.  Critical Value/emergent results were called by telephone at the time of interpretation on 08/17/2013 at 10:24 PM to Dr. Pleas Patricia , who verbally acknowledged these results.  IMPRESSION: 1. Bowel perforation, likely anastomotic breakdown at the proximal enteroenterostomy. There is pneumoperitoneum and spilling of oral contrast. 2. Moderate ascites with peritonitis.  No abscess. 3. Proximal small bowel fold thickening which could be reactive to recent surgery or from ongoing bowel ischemia.   Electronically Signed   By: Jorje Guild M.D.   On: 08/17/2013 22:29   Dg Chest Port 1 View  08/17/2013   CLINICAL DATA:  Tachycardia. Acute respiratory failure following trauma and surgery.  EXAM: PORTABLE CHEST - 1 VIEW  COMPARISON:  08/14/2013   FINDINGS: Mild improvement in bilateral airspace disease is seen since prior exam. Low lung volumes again noted. No pneumothorax identified. Support lines and tubes in appropriate position.  IMPRESSION: Mild improvement in  diffuse bilateral airspace disease.   Electronically Signed   By: Earle Gell M.D.   On: 08/17/2013 10:13   Dg Abd Portable 1v  08/17/2013   CLINICAL DATA:  Increased residuals  EXAM: PORTABLE ABDOMEN - 1 VIEW  COMPARISON:  08/12/2013  FINDINGS: Nasogastric tube remains in the stomach, which is moderately distended. Small bowel decompressed. Colon nondilated. Midline abdominal staples.  IMPRESSION: 1. Mild gastric distention despite presence of nasogastric tube.   Electronically Signed   By: Arne Cleveland M.D.   On: 08/17/2013 10:16    Review of Systems  Unable to perform ROS: intubated   Blood pressure 112/89, resp. rate 43, SpO2 99.00%. Physical Exam  Vitals reviewed. Constitutional: He appears distressed. He is intubated.  Neck: Neck supple.    Cardiovascular: Regular rhythm.  Tachycardia present.   Respiratory: Tachypnea noted. He is intubated. He has rhonchi.  GI: He exhibits distension. Bowel sounds are absent. There is generalized tenderness. There is rebound.    Lymphadenopathy:    He has no cervical adenopathy.  Skin: He is diaphoretic.    Assessment/Plan: Perforated viscus  He needs to go to or for any chance of survival although I think his long term prognosis is poor with this complication.  I have tried to reach his wife Jason Harmon at the numbers on the [facility] paperwork as well as on epic.  I am unable to get in touch with her right now and do not want to take him to the or until I am able to discuss this plan with her especially his likely long term outcome.  It appears she still wanted to pursue all care as they were planning on having g tube placed on Monday possibly but need to confirm this with her.  I would also like to get his original op reports.   This will be very difficult to take back about 3 weeks postop from other operation with poor nutritional status.  Risks include bleeding, ongoing infection, open abdomen, possible multiple operations for this, medical risks including mi/stroke/pe and there is not insignificant risk of death from this complication.  I have discussed with critical care medicine and they will admit to the icu.  I will wait to discuss with his wife before proceeding.  Jason Harmon 08/18/2013, 4:13 AM    5:54 am addendum: I have tried to get in contact with Jason Harmon who is contact point again.  There is no voicemail. The phone just rings.  [facility] does not know if family is aware and I have not been able to reach them.  I am not sure of their wishes 100% prior to taking this very ill gentleman to the operating room.  I would like to get consent prior to this surgery so they understand this. I also have his operative report now which was done 1/10.  He had elap with sbr and primary anastomosis for sbo secondary to strangulated and necrotic small bowel secondary to adhesions.  He had enterotomy upon entering that was sutured with silk.  There was significant bloody ascites.  The enterotomy closed after entry was about 20 cm proximal to the proximal staple line.    6:22- I was able to reach the daughter in law and wife of Jason Harmon.  I have explained the situation.  They are on the way to the hospital now.  I have discussed options including nonoperative which would mean he would not survive.  I discussed surgery and the likelihood that I am not  sure if he will ever leave the hospital.  I don't know that there will be a good way to repair this either and they understand the possible need for further operations.  They very much want to proceed with surgery.

## 2013-08-18 NOTE — ED Provider Notes (Signed)
CSN: [id number]     Arrival date & time 08/18/13  0330 History   First MD Initiated Contact with Patient 08/18/13 [phone]     Chief Complaint  Patient presents with  . Abdominal Injury    Bowel Perforation   (Consider location/radiation/quality/duration/timing/severity/associated sxs/prior Treatment) HPI Comments: Recent to the ER from long-term care. Patient has a complex recent medical history. Reviewing his records reveals that he was recently treated at [facility] for sepsis complicated by ischemic bowel. He had resection performed and failed to wean off the ventilator. [facility]. Patient reportedly was noted to have distended abdomen and workup including CAT scan showed bowel perforation, likely anastomosis breakdown. Patient sent to the ER for surgical evaluation. Patient unresponsive, noncommunicative, ventilated on arrival. Level V Caveat due to non-verbal state.   Past Medical History  Diagnosis Date  . Renal disorder   . A-fib   . Sepsis   . Ischemic bowel disease   . Acute renal failure    Past Surgical History  Procedure Laterality Date  . Abdominal surgery     No family history on file. History  Substance Use Topics  . Smoking status: Not on file  . Smokeless tobacco: Not on file  . Alcohol Use: Not on file    Review of Systems  Unable to perform ROS: Patient nonverbal    Allergies  Review of patient's allergies indicates no known allergies.  Home Medications  No current outpatient prescriptions on file. BP 112/89  Resp 43  SpO2 99% Physical Exam  Constitutional: He is oriented to person, place, and time. He appears well-developed and well-nourished. No distress.  HENT:  Head: Normocephalic and atraumatic.  Right Ear: Hearing normal.  Left Ear: Hearing normal.  Nose: Nose normal.  Mouth/Throat: Oropharynx is clear and moist and mucous membranes are normal.  Eyes: Conjunctivae and EOM are  normal. Pupils are equal, round, and reactive to light.  Neck: Normal range of motion. Neck supple.  Cardiovascular: Regular rhythm, S1 normal and S2 normal.  Exam reveals no gallop and no friction rub.   No murmur heard. Pulmonary/Chest: Effort normal and breath sounds normal. No respiratory distress. He exhibits no tenderness.  Abdominal: Normal appearance. He exhibits distension. There is no hepatosplenomegaly. There is no tenderness. There is no rebound, no guarding, no tenderness at McBurney's point and negative Murphy's sign. No hernia.  Musculoskeletal: Normal range of motion.  Neurological: He is alert and oriented to person, place, and time. He has normal strength. No cranial nerve deficit or sensory deficit. Coordination normal. GCS eye subscore is 4. GCS verbal subscore is 5. GCS motor subscore is 6.  Skin: Skin is warm, dry and intact. No rash noted. No cyanosis.  Psychiatric: He has a normal mood and affect. His speech is normal and behavior is normal. Thought content normal.    ED Course  Procedures (including critical care time) Labs Review Labs Reviewed  CBC WITH DIFFERENTIAL  COMPREHENSIVE METABOLIC PANEL   Imaging Review Ct Abdomen Pelvis W Contrast  08/17/2013   CLINICAL DATA:  Check for abscess. History of necrotic bowel. Recent transfer tall mm regional.  EXAM: CT ABDOMEN AND PELVIS WITH CONTRAST  TECHNIQUE: Multidetector CT imaging of the abdomen and pelvis was performed using the standard protocol following bolus administration of intravenous contrast.  CONTRAST:  27mL OMNIPAQUE IOHEXOL 300 MG/ML  SOLN  COMPARISON:  August 08, 2013  FINDINGS: BODY WALL: Anasarca.  LOWER CHEST: Moderate,  layering, water density pleural effusions with passive atelectasis. Despite a well-positioned nasogastric tube, there is gastroesophageal reflux into the lower esophagus at least.  ABDOMEN/PELVIS:  Liver: No focal abnormality.  Biliary: Vicarious excretion of contrast into the gallbladder.   Pancreas: Unremarkable.  Spleen: Unremarkable.  Adrenals: Unremarkable.  Kidneys and ureters: No hydronephrosis. Symmetric perinephric edema. Two right renal cysts, 17 mm in the anterior hilar lip.  Bladder: Decompressed by Foley catheter.  Reproductive: Unremarkable.  Bowel: Status post small bowel resection with enteroenterostomy x2. At the more proximal anastomosis, there is extravasation of oral contrast. Interval development of moderate pneumoperitoneum scattered throughout the abdomen. Moderate volume of ascites without definite loculated collection. There is likely some complexity to the peritoneal fluid in the ventral left lower quadrant, which can be visualized at the time of surgery. No bowel obstruction. Normal appendix.  Retroperitoneum: No mass or adenopathy.  Peritoneum: As above. Peritoneal enhancement in the lower abdomen consistent with peritonitis.  Vascular: No acute abnormality.  OSSEOUS: No acute abnormalities.  Critical Value/emergent results were called by telephone at the time of interpretation on 08/17/2013 at 10:24 PM to Dr. Pleas Patricia , who verbally acknowledged these results.  IMPRESSION: 1. Bowel perforation, likely anastomotic breakdown at the proximal enteroenterostomy. There is pneumoperitoneum and spilling of oral contrast. 2. Moderate ascites with peritonitis.  No abscess. 3. Proximal small bowel fold thickening which could be reactive to recent surgery or from ongoing bowel ischemia.   Electronically Signed   By: Jorje Guild M.D.   On: 08/17/2013 22:29   Dg Chest Port 1 View  08/17/2013   CLINICAL DATA:  Tachycardia. Acute respiratory failure following trauma and surgery.  EXAM: PORTABLE CHEST - 1 VIEW  COMPARISON:  08/14/2013  FINDINGS: Mild improvement in bilateral airspace disease is seen since prior exam. Low lung volumes again noted. No pneumothorax identified. Support lines and tubes in appropriate position.  IMPRESSION: Mild improvement in diffuse bilateral  airspace disease.   Electronically Signed   By: Earle Gell M.D.   On: 08/17/2013 10:13   Dg Abd Portable 1v  08/17/2013   CLINICAL DATA:  Increased residuals  EXAM: PORTABLE ABDOMEN - 1 VIEW  COMPARISON:  08/12/2013  FINDINGS: Nasogastric tube remains in the stomach, which is moderately distended. Small bowel decompressed. Colon nondilated. Midline abdominal staples.  IMPRESSION: 1. Mild gastric distention despite presence of nasogastric tube.   Electronically Signed   By: Arne Cleveland M.D.   On: 08/17/2013 10:16    EKG Interpretation   None       MDM  Diagnosis: Pneumoperitoneum secondary to perforated bowel  Patient referred to the emergency department from long-term care for evaluation of pneumoperitoneum. Patient had a CAT scan performed that showed breakdown of recent anastomosis performed when he had ischemic bowel last month. General surgery consultation requested.    Orpah Greek, MD 08/18/13 [phone]

## 2013-08-18 NOTE — Progress Notes (Signed)
PARENTERAL NUTRITION CONSULT NOTE - FOLLOW UP  Pharmacy Consult for TPN Indication: perforated viscous  No Known Allergies  Patient Measurements:   Weight at [facility] 89.3 kg  Vital Signs: Temp: 98.1 F (36.7 C) (02/01 1012) Temp src: Axillary (02/01 1012) BP: 111/65 mmHg (02/01 1200) Pulse Rate: 74 (02/01 1200) Intake/Output from previous day:   Intake/Output from this shift: Total I/O In: 1300 [I.V.:800; IV Piggyback:500] Out: 4865 [Urine:65; Emesis/NG output:300; Drains:500; Other:4000]  Labs:  Recent Labs  08/17/13 0610 08/17/13 1125 08/18/13 0404  WBC 40.9* 41.2* 61.2*  HGB 10.0* 10.0* 11.1*  HCT 29.2* 29.3* 32.2*  PLT 500* 451* 485*  APTT  --   --  30  INR  --   --  1.28     Recent Labs  08/16/13 0635 08/17/13 0610 08/17/13 1125 08/18/13 0404  NA 140 141 137 138  K 3.7 3.7 4.0 4.3  CL 97 99 97 98  CO2 22 24 22 22   GLUCOSE 363* 288* 337* 190*  BUN 104* 73* 77* 94*  CREATININE 3.42* 2.39* 2.48* 2.90*  CALCIUM 7.0* 7.1* 6.9* 7.2*  PHOS 8.0*  --   --   --   PROT  --   --  4.6* 4.7*  ALBUMIN 1.5*  --  1.5* 1.5*  AST  --   --  20 16  ALT  --   --  11 11  ALKPHOS  --   --  60 63  BILITOT  --   --  0.6 0.8  BILIDIR  --   --  0.3  --   IBILI  --   --  0.3  --    CrCl is unknown because there is no height on file for the current visit.    Recent Labs  08/18/13 1224  GLUCAP 139*    Medications:  Infusions:  . sodium chloride 100 mL/hr at 08/18/13 1000  . norepinephrine (LEVOPHED) Adult infusion 8 mcg/min (08/18/13 1200)  . norepinephrine (LEVOPHED) Adult infusion 10 mcg/min (08/18/13 1020)  . vasopressin (PITRESSIN) infusion - *FOR SHOCK* 0.03 Units/min (08/18/13 1200)    Insulin Requirements in the past 24 hours:  unknown  Current Nutrition:  At [facility], was on Clinimix E 5/20 at 65 ml/hr.  Currently off.  Nutritional Goals:  ~2000 kCal, ~85 grams of protein per day  Admit: 73 year old male with SBO and ischemic bowel s/p resection at  [facility].  Was transferred to [facility] for continuing hemodialysis and inability to wean from ventilator.  Today he developed a new bowel perforation at his anastomosis site and is s/p ex lap with small bowel resection and wound vac placement.  GI: new bowel perforation s/p ex lap; was on TPN at [facility].  Now to restart. Endo: elevated glucose, will need CBG checks and SSI.  No insulin in TPN at [facility] per discussion with pharmacist. Evaristo Bury: K 4.3 Renal: newly HD-dependent Pulm: 60% FiO2 Cards: requiring pressor support Hepatobil: LFTs ok Neuro:  Fentanyl, midazolam for sedation ID: empiric Zosyn for peritonitis  Zosyn 2/1>>  Best Practices: SCDs, Mouth care, PPI IV TPN Access: CVC   Plan:  Change TPN to Clinimix 5/15 (no electrolytes) at 75 ml/hr + 20% lipids at 10 ml/hr Will use electrolyte-free formula pending renal replacement therapy orders. Check TPN labs on Monday 2/2  Legrand Como, Pharm.D., BCPS, AAHIVP Clinical Pharmacist Phone: [phone] or [phone] 08/18/2013, 1:00 PM

## 2013-08-18 NOTE — ED Notes (Signed)
Pt responding to painful stimuli at the time. Remains on cardiac monitor. Vital signs stable. Pt to be transported to OR. 2 RN's attempted to contact family to obtain consent for surgery, unable to talk with family at the time.

## 2013-08-18 NOTE — Transfer of Care (Signed)
Immediate Anesthesia Transfer of Care Note  Patient: Jason Harmon  Procedure(s) Performed: Procedure(s): EXPLORATORY LAPAROTOMY (N/A)  Patient Location: SICU  Anesthesia Type:General  Level of Consciousness: Patient remains intubated per anesthesia plan  Airway & Oxygen Therapy: Patient placed on Ventilator (see vital sign flow sheet for setting)  Post-op Assessment: Report given to PACU RN and Post -op Vital signs reviewed and stable  Post vital signs: Reviewed and stable  Complications: No apparent anesthesia complications

## 2013-08-18 NOTE — Progress Notes (Signed)
Received patient from OR, [facility] is from [facility], placed on prior settings. # 8 shiley in place.

## 2013-08-19 ENCOUNTER — Inpatient Hospital Stay (HOSPITAL_COMMUNITY): Payer: Medicare Other

## 2013-08-19 ENCOUNTER — Encounter (HOSPITAL_COMMUNITY): Payer: Self-pay | Admitting: *Deleted

## 2013-08-19 DIAGNOSIS — J9620 Acute and chronic respiratory failure, unspecified whether with hypoxia or hypercapnia: Secondary | ICD-10-CM

## 2013-08-19 DIAGNOSIS — K631 Perforation of intestine (nontraumatic): Secondary | ICD-10-CM

## 2013-08-19 LAB — BLOOD GAS, ARTERIAL
Acid-base deficit: 4.3 mmol/L — ABNORMAL HIGH (ref 0.0–2.0)
BICARBONATE: 19.7 meq/L — AB (ref 20.0–24.0)
DRAWN BY: 34779
FIO2: 0.4 %
O2 Saturation: 99.1 %
PEEP: 8 cmH2O
PO2 ART: 135 mmHg — AB (ref 80.0–100.0)
PRESSURE CONTROL: 14 cmH2O
Patient temperature: 98.1
RATE: 30 resp/min
TCO2: 20.7 mmol/L (ref 0–100)
pCO2 arterial: 32.4 mmHg — ABNORMAL LOW (ref 35.0–45.0)
pH, Arterial: 7.4 (ref 7.350–7.450)

## 2013-08-19 LAB — MAGNESIUM: MAGNESIUM: 1.9 mg/dL (ref 1.5–2.5)

## 2013-08-19 LAB — CBC
HEMATOCRIT: 22.7 % — AB (ref 39.0–52.0)
Hemoglobin: 7.9 g/dL — ABNORMAL LOW (ref 13.0–17.0)
MCH: 30.6 pg (ref 26.0–34.0)
MCHC: 34.8 g/dL (ref 30.0–36.0)
MCV: 88 fL (ref 78.0–100.0)
Platelets: 329 10*3/uL (ref 150–400)
RBC: 2.58 MIL/uL — ABNORMAL LOW (ref 4.22–5.81)
RDW: 14.6 % (ref 11.5–15.5)
WBC: 43.6 10*3/uL — ABNORMAL HIGH (ref 4.0–10.5)

## 2013-08-19 LAB — COMPREHENSIVE METABOLIC PANEL
ALBUMIN: 1.4 g/dL — AB (ref 3.5–5.2)
ALK PHOS: 52 U/L (ref 39–117)
ALT: 11 U/L (ref 0–53)
AST: 17 U/L (ref 0–37)
BUN: 115 mg/dL — ABNORMAL HIGH (ref 6–23)
CALCIUM: 6.6 mg/dL — AB (ref 8.4–10.5)
CO2: 20 mEq/L (ref 19–32)
Chloride: 100 mEq/L (ref 96–112)
Creatinine, Ser: 3.35 mg/dL — ABNORMAL HIGH (ref 0.50–1.35)
GFR calc non Af Amer: 17 mL/min — ABNORMAL LOW (ref >90)
GFR, EST AFRICAN AMERICAN: 20 mL/min — AB (ref >90)
Glucose, Bld: 352 mg/dL — ABNORMAL HIGH (ref 70–99)
Potassium: 4.2 mEq/L (ref 3.7–5.3)
SODIUM: 138 meq/L (ref 137–147)
TOTAL PROTEIN: 3.7 g/dL — AB (ref 6.0–8.3)
Total Bilirubin: 0.5 mg/dL (ref 0.3–1.2)

## 2013-08-19 LAB — GLUCOSE, CAPILLARY
GLUCOSE-CAPILLARY: 89 mg/dL (ref 70–99)
Glucose-Capillary: 208 mg/dL — ABNORMAL HIGH (ref 70–99)
Glucose-Capillary: 310 mg/dL — ABNORMAL HIGH (ref 70–99)

## 2013-08-19 LAB — TRIGLYCERIDES: Triglycerides: 291 mg/dL — ABNORMAL HIGH (ref <150)

## 2013-08-19 LAB — PREALBUMIN: PREALBUMIN: 11.5 mg/dL — AB (ref 17.0–34.0)

## 2013-08-19 LAB — PHOSPHORUS: PHOSPHORUS: 8.5 mg/dL — AB (ref 2.3–4.6)

## 2013-08-19 MED ORDER — INSULIN ASPART 100 UNIT/ML ~~LOC~~ SOLN
2.0000 [IU] | SUBCUTANEOUS | Status: DC
  Administered 2013-08-19 (×2): 6 [IU] via SUBCUTANEOUS

## 2013-08-19 MED ORDER — ALBUMIN HUMAN 25 % IV SOLN
INTRAVENOUS | Status: AC
  Administered 2013-08-19: 25 g via INTRAVENOUS
  Filled 2013-08-19: qty 100

## 2013-08-19 MED ORDER — TRACE MINERALS CR-CU-F-FE-I-MN-MO-SE-ZN IV SOLN
INTRAVENOUS | Status: AC
  Administered 2013-08-19: 18:00:00 via INTRAVENOUS
  Filled 2013-08-19: qty 2000

## 2013-08-19 MED ORDER — FAT EMULSION 20 % IV EMUL
250.0000 mL | INTRAVENOUS | Status: AC
  Administered 2013-08-19: 250 mL via INTRAVENOUS
  Filled 2013-08-19: qty 250

## 2013-08-19 MED ORDER — SODIUM CHLORIDE 0.9 % IV SOLN
INTRAVENOUS | Status: AC
  Administered 2013-08-19: 2.8 [IU]/h via INTRAVENOUS
  Administered 2013-08-20: 19:00:00 via INTRAVENOUS
  Filled 2013-08-19 (×3): qty 1

## 2013-08-19 MED ORDER — ALBUMIN HUMAN 25 % IV SOLN
25.0000 g | Freq: Once | INTRAVENOUS | Status: AC
  Administered 2013-08-19: 25 g via INTRAVENOUS

## 2013-08-19 NOTE — Progress Notes (Signed)
PARENTERAL NUTRITION CONSULT NOTE - FOLLOW UP  Pharmacy Consult for TPN Indication: perforated viscous  No Known Allergies  Patient Measurements: Height: 5\' 11"  (180.3 cm) (5'11") Weight: 194 lb 3.6 oz (88.1 kg) IBW/kg (Calculated) : 75.3 Weight at [facility] 89.3 kg  Vital Signs: Temp: 98.6 F (37 C) (02/02 0851) Temp src: Oral (02/02 0851) BP: 106/72 mmHg (02/02 1100) Pulse Rate: 114 (02/02 1100) Intake/Output from previous day: 02/01 0701 - 02/02 0700 In: 4751.2 [I.V.:2967.7; NG/GT:30; IV Piggyback:650; TPN:1103.6] Out: 7660 [Urine:570; Emesis/NG output:790; Drains:2300] Intake/Output from this shift: Total I/O In: 719.7 [I.V.:379.7; TPN:340] Out: 100 [Urine:100]  Labs:  Recent Labs  08/17/13 1125 08/18/13 0404 08/19/13 0330  WBC 41.2* 61.2* 43.6*  HGB 10.0* 11.1* 7.9*  HCT 29.3* 32.2* 22.7*  PLT 451* 485* 329  APTT  --  30  --   INR  --  1.28  --      Recent Labs  08/17/13 0610 08/17/13 1125 08/18/13 0404 08/19/13 0330  NA 141 137 138 138  K 3.7 4.0 4.3 4.2  CL 99 97 98 100  CO2 24 22 22 20   GLUCOSE 288* 337* 190* 352*  BUN 73* 77* 94* 115*  CREATININE 2.39* 2.48* 2.90* 3.35*  CALCIUM 7.1* 6.9* 7.2* 6.6*  MG  --   --   --  1.9  PHOS  --   --   --  8.5*  PROT  --  4.6* 4.7* 3.7*  ALBUMIN  --  1.5* 1.5* 1.4*  AST  --  20 16 17   ALT  --  11 11 11   ALKPHOS  --  60 63 52  BILITOT  --  0.6 0.8 0.5  BILIDIR  --  0.3  --   --   IBILI  --  0.3  --   --   TRIG  --   --   --  291*   Estimated Creatinine Clearance: 21.9 ml/min (by C-G formula based on Cr of 3.35).    Recent Labs  08/18/13 1537 08/18/13 2000 08/18/13 2357  GLUCAP 125* 208* 310*    Medications:  Infusions:  . sodium chloride 50 mL/hr at 08/19/13 1013  . TPN (CLINIMIX) Adult without lytes 75 mL/hr at 08/18/13 1900   And  . fat emulsion 250 mL (08/18/13 1900)  . insulin (NOVOLIN-R) infusion 8.3 mL/hr at 08/19/13 1100  . norepinephrine (LEVOPHED) Adult infusion Stopped (08/19/13  0500)  . vasopressin (PITRESSIN) infusion - *FOR SHOCK* Stopped (08/19/13 0500)    Insulin Requirements in the past 24 hours:  Started on insulin drip early this morning  Current Nutrition:  At [facility], was on Clinimix E 5/20 at 65 ml/hr. 2/1 chaned to Clinimix E 5/15 at 75 ml/hr + lipids at 10 mlg/hr  Nutritional Goals: per RD note 2/2 2130 kCal, 120-135 grams of protein per day  Admit: 73 year old male with SBO and ischemic bowel s/p resection at [facility].  Was transferred to [facility] for continuing hemodialysis and inability to wean from ventilator.  On Sunday 2/1 developed a new bowel perforation at his anastomosis site and is s/p ex lap with small bowel resection and wound vac placement.  GI: new bowel perforation s/p ex lap; was on TPN at [facility].   Endo:  No insulin in TPN at [facility] per discussion with pharmacist. Insulin drip started this morning. Do not think the cause of hyperglycemia is the TPN since he was on it at [facility].  Lytes: No electrolytes in current TPN,  K  4.2, phos elevated at 8.5, mag 1.9.   Renal: newly HD-dependent; on HD at [facility], renal consulted. Hepatobil: LFTs ok; trig 291  care, PPI IV TPN Access: CVC   Plan:   1. Increase Clinimix 5/15 (no electrolytes) to 80 ml/hr + 20% lipids at 10 ml/hr.  This will provide 96 gm of protein and 1843 kcals.  This is 80% of protein and 87% kcal goals. 2. electrolyte-free formula pending renal replacement therapy orders. 3. No insulin in TPN, on new insulin drip Eudelia Bunch, Pharm.D. [phone] 08/19/2013 11:56 AM

## 2013-08-19 NOTE — Progress Notes (Signed)
PULMONARY  / CRITICAL CARE MEDICINE HISTORY AND PHYSICAL EXAMINATION  Name: Jason Harmon MRN: [id number] DOB: 20-Mar-1941    ADMISSION DATE:  08/18/2013  CHIEF COMPLAINT:  Bowel perforation  BRIEF PATIENT DESCRIPTION:  73 yo male with SBO/ischemic bowel s/p resection with subsequent complicated medical course including respiratory failure and inability to wean from ventilator. He is now admitted with bowel perforation at anastomosis site.  SIGNIFICANT EVENTS: 1/22 Tracheostomy 2/01 Transfer from [facility], to ER, to [facility] ICU.  CCS consulted 2/02 Off pressors  STUDIES:  CT A/P with contrast 08/17/2013 - bowel perforation, likely anastomotic breakdown at proximal enteroenterostomy with pneumoperitoneum and spilling of oral contrast, moderate ascites with peritonitis  LINES / TUBES: Tracheostomy 1/22>>> R IJ HD catheter 1/12>>>  CULTURES:  ANTIBIOTICS: Zosyn 2/1   SUBJECTIVE: Did not tolerate pressure support.  PHYSICAL EXAM VITAL SIGNS: Temp:  [98.1 F (36.7 C)-99.4 F (37.4 C)] 98.6 F (37 C) (02/02 0851) Pulse Rate:  [70-115] 113 (02/02 0800) Resp:  [20-35] 30 (02/02 0800) BP: (79-131)/(49-83) 111/69 mmHg (02/02 0800) SpO2:  [100 %] 100 % (02/02 0800) FiO2 (%):  [40 %-60 %] 40 % (02/02 0816) Weight:  [194 lb 3.6 oz (88.1 kg)-194 lb 10.7 oz (88.3 kg)] 194 lb 3.6 oz (88.1 kg) (02/02 0500)  VENTILATOR SETTINGS: Vent Mode:  [-] PCV FiO2 (%):  [40 %-60 %] 40 % Set Rate:  [30 bmp] 30 bmp PEEP:  [8 cmH20] 8 cmH20 Plateau Pressure:  [17 cmH20-20 cmH20] 20 cmH20  INTAKE / OUTPUT: Intake/Output     02/01 0701 - 02/02 0700 02/02 0701 - 02/03 0700   I.V. (mL/kg) 2967.7 (33.7) 106.8 (1.2)   NG/GT 30    IV Piggyback 650    TPN 1103.6 85   Total Intake(mL/kg) 4751.2 (53.9) 191.8 (2.2)   Urine (mL/kg/hr) 570 (0.3) 35 (0.1)   Emesis/NG output 790 (0.4)    Drains 2300 (1.1)    Other 4000 (1.9)    Total Output 7660 35   Net -2908.8 +156.8          PHYSICAL  EXAMINATION: General: ill appearing Neuro: follows simple commands HEENT:  Trach site clean Cardiovascular:  Tachycardic, no murmurs/rubs/gallops Lungs:  Coarse breath sounds anteriorly Abdomen:  Distended, mild tenderness Musculoskeletal:  2+ edema Skin:  No rash  LABS:  CBC Recent Labs     08/17/13  1125  08/18/13  0404  08/19/13  0330  WBC  41.2*  61.2*  43.6*  HGB  10.0*  11.1*  7.9*  HCT  29.3*  32.2*  22.7*  PLT  451*  485*  329    Coag's Recent Labs     08/18/13  0404  APTT  30  INR  1.28    BMET Recent Labs     08/17/13  1125  08/18/13  0404  08/19/13  0330  NA  137  138  138  K  4.0  4.3  4.2  CL  97  98  100  CO2  22  22  20   BUN  77*  94*  115*  CREATININE  2.48*  2.90*  3.35*  GLUCOSE  337*  190*  352*    Electrolytes Recent Labs     08/17/13  1125  08/18/13  0404  08/19/13  0330  CALCIUM  6.9*  7.2*  6.6*  MG   --    --   1.9  PHOS   --    --   8.5*  Sepsis Markers Recent Labs     08/17/13  1125  PROCALCITON  0.95    ABG Recent Labs     08/18/13  1009  08/19/13  0455  PHART  7.328*  7.400  PCO2ART  43.6  32.4*  PO2ART  188.0*  135.0*    Liver Enzymes Recent Labs     08/17/13  1125  08/18/13  0404  08/19/13  0330  AST  20  16  17   ALT  11  11  11   ALKPHOS  60  63  52  BILITOT  0.6  0.8  0.5  ALBUMIN  1.5*  1.5*  1.4*   Glucose Recent Labs     08/18/13  1224  08/18/13  1537  08/18/13  2000  08/18/13  2357  GLUCAP  139*  125*  208*  310*    Imaging Ct Abdomen Pelvis W Contrast  08/17/2013   CLINICAL DATA:  Check for abscess. History of necrotic bowel. Recent transfer tall mm regional.  EXAM: CT ABDOMEN AND PELVIS WITH CONTRAST  TECHNIQUE: Multidetector CT imaging of the abdomen and pelvis was performed using the standard protocol following bolus administration of intravenous contrast.  CONTRAST:  23mL OMNIPAQUE IOHEXOL 300 MG/ML  SOLN  COMPARISON:  August 08, 2013  FINDINGS: BODY WALL: Anasarca.  LOWER  CHEST: Moderate, layering, water density pleural effusions with passive atelectasis. Despite a well-positioned nasogastric tube, there is gastroesophageal reflux into the lower esophagus at least.  ABDOMEN/PELVIS:  Liver: No focal abnormality.  Biliary: Vicarious excretion of contrast into the gallbladder.  Pancreas: Unremarkable.  Spleen: Unremarkable.  Adrenals: Unremarkable.  Kidneys and ureters: No hydronephrosis. Symmetric perinephric edema. Two right renal cysts, 17 mm in the anterior hilar lip.  Bladder: Decompressed by Foley catheter.  Reproductive: Unremarkable.  Bowel: Status post small bowel resection with enteroenterostomy x2. At the more proximal anastomosis, there is extravasation of oral contrast. Interval development of moderate pneumoperitoneum scattered throughout the abdomen. Moderate volume of ascites without definite loculated collection. There is likely some complexity to the peritoneal fluid in the ventral left lower quadrant, which can be visualized at the time of surgery. No bowel obstruction. Normal appendix.  Retroperitoneum: No mass or adenopathy.  Peritoneum: As above. Peritoneal enhancement in the lower abdomen consistent with peritonitis.  Vascular: No acute abnormality.  OSSEOUS: No acute abnormalities.  Critical Value/emergent results were called by telephone at the time of interpretation on 08/17/2013 at 10:24 PM to Dr. Pleas Patricia , who verbally acknowledged these results.  IMPRESSION: 1. Bowel perforation, likely anastomotic breakdown at the proximal enteroenterostomy. There is pneumoperitoneum and spilling of oral contrast. 2. Moderate ascites with peritonitis.  No abscess. 3. Proximal small bowel fold thickening which could be reactive to recent surgery or from ongoing bowel ischemia.   Electronically Signed   By: Jorje Guild M.D.   On: 08/17/2013 22:29   Dg Chest Port 1 View  08/19/2013   CLINICAL DATA:  Postop exploratory laparotomy for perforated viscus.  EXAM:  PORTABLE CHEST - 1 VIEW  COMPARISON:  DG CHEST 1V PORT dated 08/17/2013; DG CHEST 1V PORT dated 08/14/2013  FINDINGS: 0556 hr. Tracheostomy appears well positioned. The nasogastric tube and bilateral central lines are unchanged. There is improved aeration of the lung bases with near-complete resolution of bibasilar pulmonary opacities. No pneumothorax or significant pleural effusion is identified. The heart size and mediastinal contours are stable.  IMPRESSION: Significant interval improvement in the aeration of the lungs. Stable support system.  Electronically Signed   By: Camie Patience M.D.   On: 08/19/2013 07:15   Dg Chest Port 1 View  08/17/2013   CLINICAL DATA:  Tachycardia. Acute respiratory failure following trauma and surgery.  EXAM: PORTABLE CHEST - 1 VIEW  COMPARISON:  08/14/2013  FINDINGS: Mild improvement in bilateral airspace disease is seen since prior exam. Low lung volumes again noted. No pneumothorax identified. Support lines and tubes in appropriate position.  IMPRESSION: Mild improvement in diffuse bilateral airspace disease.   Electronically Signed   By: Earle Gell M.D.   On: 08/17/2013 10:13   Dg Abd Portable 1v  08/17/2013   CLINICAL DATA:  Increased residuals  EXAM: PORTABLE ABDOMEN - 1 VIEW  COMPARISON:  08/12/2013  FINDINGS: Nasogastric tube remains in the stomach, which is moderately distended. Small bowel decompressed. Colon nondilated. Midline abdominal staples.  IMPRESSION: 1. Mild gastric distention despite presence of nasogastric tube.   Electronically Signed   By: Arne Cleveland M.D.   On: 08/17/2013 10:16    ASSESSMENT / PLAN:  PULMONARY A: Acute respiratory failure in setting of perforated bowel.  S/p Tracheostomy 1/22. P: -full vent support -f/u CXR -prn BD's  CARDIOVASCULAR A: Septic shock from perforated bowel >> off pressors 2/02. Hx of A fib >> previously on amiodarone. P: -monitor hemodynamics -decrease IV fluids to 50 ml/hr on 2/02  RENAL A: Acute  kidney injury 2nd to ischemic bowel, shock >> started on HD January 2015. P: -consulted renal 2/02  GASTROINTESTINAL A: Perforated bowel. Protein calorie malnutrition. P: -plan for OR 2/03 -continue TNA -protonix for SUP  HEMATOLOGIC A: Anemia of critical illness. P: -f/u CBC -SCD for DVT prevention >> add SQ heparin after surgery  INFECTIOUS A: Septic shock from bowel perforation. P: -continue zosyn  ENDOCRINE A: Hyperglycemia. P: -SSI  NEUROLOGIC A: Acute encephalopathy 2nd to sepsis. P: -sedation protocol while on vent  Updated family at bedside.  Explained long term prognosis depends on finding from [place] trip 2/03.  CC time 35 minutes.  Chesley Mires, MD [facility] Pulmonary/Critical Care 08/19/2013, 10:12 AM Pager:  [phone] After 3pm call: [phone]

## 2013-08-19 NOTE — Consult Note (Signed)
Jason Harmon 08/19/2013 Sabra Heck, B Requesting Physician:  Emelia Loron / Coralyn Helling  Reason for Consult:  Renal failure, dialysis dependence, septic shock from abd source HPI:  63M  Originally presented with SBO requiring resection and complicated course thereafter including VDRF with failure to liberate (was at [facility] before transfer), dialysis dependent AKI, admitted to [facility] from [facility] with bowel performation at anastomotic side with septic shock.  Plan to return to OR tomorrow.  HD started as CRRT early January.  Since transfer to [facility] has been taking off pressors.  Has been persistently azotemic, nonoliguric, and with daily increase in SCr.  By report last HD was Saturday (labs suggest Friday)  I/Os: I/O last 3 completed shifts: In: 4751.2 [I.V.:2967.7; NG/GT:30; IV Piggyback:650] Out: 7660 [Urine:570; Emesis/NG output:790; Drains:2300; Other:4000]  ROS Balance of 12 systems is negative w/ exceptions as above  PMH  Past Medical History  Diagnosis Date  . Renal disorder   . A-fib   . Sepsis   . Ischemic bowel disease   . Acute renal failure    PSH  Past Surgical History  Procedure Laterality Date  . Abdominal surgery     FH No family history on file. SH  has no tobacco, alcohol, and drug history on file. Allergies No Known Allergies Home medications Prior to Admission medications   Medication Sig Start Date End Date Taking? Authorizing Provider  acetaminophen (TYLENOL) 325 MG tablet Place 650 mg into feeding tube every 6 (six) hours as needed for mild pain or fever.   Yes Historical Provider, MD  amLODipine (NORVASC) 5 MG tablet Place 5 mg into feeding tube daily.   Yes Historical Provider, MD  chlorhexidine (PERIDEX) 0.12 % solution Use as directed 15 mLs in the mouth or throat 2 (two) times daily.   Yes Historical Provider, MD  clonazePAM (KLONOPIN) 1 MG tablet Place 1 mg into feeding tube every 8 (eight) hours.   Yes Historical Provider, MD  dexmedetomidine  (PRECEDEX) 200 MCG/50ML SOLN Inject 0.4-1.2 mcg/kg/hr into the vein continuous.   Yes Historical Provider, MD  fat emulsion 20 % infusion Inject into the vein continuous. Pt was receiving at [facility] on M/W/F   Yes Historical Provider, MD  haloperidol lactate (HALDOL) 5 MG/ML injection Inject 1 mg into the muscle every 6 (six) hours as needed (sedation).   Yes Historical Provider, MD  hydroxypropyl methylcellulose (ISOPTO TEARS) 2.5 % ophthalmic solution Place 2 drops into both eyes every 4 (four) hours as needed for dry eyes.   Yes Historical Provider, MD  insulin detemir (LEVEMIR) 100 UNIT/ML injection Inject 12 Units into the skin 2 (two) times daily.   Yes Historical Provider, MD  ipratropium-albuterol (DUONEB) 0.5-2.5 (3) MG/3ML SOLN Take 3 mLs by nebulization every 6 (six) hours.   Yes Historical Provider, MD  iron polysaccharides (NIFEREX) 150 MG capsule Take 150 mg by mouth 2 (two) times daily.   Yes Historical Provider, MD  lactobacillus (FLORANEX/LACTINEX) PACK Place 1 g into feeding tube 2 (two) times daily.   Yes Historical Provider, MD  methylPREDNISolone sodium succinate (SOLU-MEDROL) 40 mg/mL injection Inject 40 mg into the vein every 8 (eight) hours.   Yes Historical Provider, MD  metoprolol tartrate (LOPRESSOR) 25 MG tablet Place 75 mg into feeding tube 2 (two) times daily.   Yes Historical Provider, MD  mupirocin ointment (BACTROBAN) 2 % Place 1 application into the nose 3 (three) times daily. 08/12/13 08/22/13 Yes Historical Provider, MD  neomycin-bacitracin-polymyxin (NEOSPORIN) [id number] ointment Apply 1 application  topically 2 (two) times daily.   Yes Historical Provider, MD  nitroGLYCERIN (NITROSTAT) 0.4 MG SL tablet Place 0.4 mg under the tongue every 5 (five) minutes as needed for chest pain.   Yes Historical Provider, MD  pantoprazole (PROTONIX) 40 MG injection Inject 40 mg into the vein 2 (two) times daily.   Yes Historical Provider, MD  sennosides-docusate sodium  (SENOKOT-S) 8.6-50 MG tablet Take 1 tablet by mouth 2 (two) times daily as needed for constipation.   Yes Historical Provider, MD  sodium chloride 0.9 % SOLN 100 mL with meropenem 1 G SOLR 1 g Inject 1 g into the vein daily.   Yes Historical Provider, MD  temazepam (RESTORIL) 15 MG capsule Take 30 mg by mouth at bedtime as needed for sleep.   Yes Historical Provider, MD  TPN (CLINIMIX-E)ADULT WITH LYTES Inject 65 mL/hr into the vein continuous.   Yes Historical Provider, MD    Current Medications Current Facility-Administered Medications  Medication Dose Route Frequency Provider Last Rate Last Dose  . 0.9 %  sodium chloride infusion  250 mL Intravenous PRN Cornelia Kerney Elbe, MD      . 0.9 %  sodium chloride infusion   Intravenous Continuous Chesley Mires, MD 50 mL/hr at 08/19/13 1013    . albuterol (PROVENTIL) (2.5 MG/3ML) 0.083% nebulizer solution 2.5 mg  2.5 mg Nebulization Q2H PRN Cornelia Kerney Elbe, MD      . antiseptic oral rinse (BIOTENE) solution 15 mL  15 mL Mouth Rinse QID Rush Farmer, MD   15 mL at 08/19/13 0400  . chlorhexidine (PERIDEX) 0.12 % solution 15 mL  15 mL Mouth Rinse BID Rush Farmer, MD   15 mL at 08/19/13 0751  . TPN (CLINIMIX) Adult without lytes   Intravenous Continuous TPN Norva Riffle, RPH 75 mL/hr at 08/18/13 1900     And  . fat emulsion 20 % infusion 250 mL  250 mL Intravenous Continuous TPN Norva Riffle, RPH 10 mL/hr at 08/18/13 1900 250 mL at 08/18/13 1900  . TPN (CLINIMIX) Adult without lytes   Intravenous Continuous TPN Eudelia Bunch, RPH       And  . fat emulsion 20 % infusion 250 mL  250 mL Intravenous Continuous TPN Eudelia Bunch, RPH      . fentaNYL (SUBLIMAZE) injection 50-100 mcg  50-100 mcg Intravenous Q1H PRN Elsie Stain, MD   100 mcg at 08/19/13 1133  . HYDROmorphone (DILAUDID) injection 1 mg  1 mg Intravenous Q2H PRN Thomas A. Cornett, MD      . insulin regular (NOVOLIN R,HUMULIN R) 1 Units/mL in sodium chloride 0.9 % 100 mL infusion    Intravenous Continuous Rigoberto Noel, MD 8.3 mL/hr at 08/19/13 1100    . ipratropium (ATROVENT) nebulizer solution 0.5 mg  0.5 mg Nebulization Q2H PRN Cornelia Kerney Elbe, MD      . midazolam (VERSED) injection 1-2 mg  1-2 mg Intravenous Q1H PRN Rush Farmer, MD   2 mg at 08/19/13 0600  . norepinephrine (LEVOPHED) 16 mg in dextrose 5 % 250 mL infusion  2-50 mcg/min Intravenous Continuous Cornelia Kerney Elbe, MD   1 mcg/min at 08/19/13 0400  . pantoprazole (PROTONIX) injection 40 mg  40 mg Intravenous QHS Cornelia Kerney Elbe, MD   40 mg at 08/18/13 2230  . piperacillin-tazobactam (ZOSYN) IVPB 2.25 g  2.25 g Intravenous Q8H Juanda Chance [place], RPH   2.25 g at 08/19/13 6283  . sodium chloride 0.9 %  injection 10-40 mL  10-40 mL Intracatheter PRN Orpah Greek, MD   10 mL at 08/18/13 0538  . vasopressin (PITRESSIN) 50 Units in sodium chloride 0.9 % 250 mL infusion  0.03 Units/min Intravenous Continuous Rush Farmer, MD   0.03 Units/min at 08/19/13 0400    CBC  Recent Labs Lab 08/17/13 1125 08/18/13 0404 08/19/13 0330  WBC 41.2* 61.2* 43.6*  NEUTROABS  --  58.8*  --   HGB 10.0* 11.1* 7.9*  HCT 29.3* 32.2* 22.7*  MCV 89.3 88.2 88.0  PLT 451* 485* 562   Basic Metabolic Panel  Recent Labs Lab 08/13/13 0810 08/14/13 0439 08/16/13 0635 08/17/13 0610 08/17/13 1125 08/18/13 0404 08/19/13 0330  NA 141 143 140 141 137 138 138  K 3.8 3.4* 3.7 3.7 4.0 4.3 4.2  CL 99 100 97 99 97 98 100  CO2 16* 21 22 24 22 22 20   GLUCOSE 407* 311* 363* 288* 337* 190* 352*  BUN 147* 115* 104* 73* 77* 94* 115*  CREATININE 5.62* 4.43* 3.42* 2.39* 2.48* 2.90* 3.35*  CALCIUM 7.0* 7.1* 7.0* 7.1* 6.9* 7.2* 6.6*  PHOS 10.5* 8.2* 8.0*  --   --   --  8.5*    Physical Exam  Blood pressure 106/72, pulse 114, temperature 98.6 F (37 C), temperature source Oral, resp. rate 30, height 5\' 11"  (1.803 m), weight 88.1 kg (194 lb 3.6 oz), SpO2 100.00%. GEN: chronically ill appaering, itubated, sedated ENT: Trach and R  IJ nontunneled HD Catheter noted EYES: EOMI.  Eyes open to noise, stimullus CV: tachy, regular  PULM: +Bs b/l ABD: wound vac at midline SKIN: large ventral abd incision EXT:1+ pitting edema   A/P 1. 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  HD today and then on tentative MWF schedule pending pt status.  Eventually will need to tunnel catheter.  Azotemia is multifactorial but large part of it is renal failure. K and HCO3 acceptable.  Phos elevated currently, not eating, on TPN 2. Anemia; transfusion per CCM and Surgery. If stabilizes moving forward can consider ESA 3. Septic Shock: per ccm, 4. SB Performation: per CCS   Pearson Grippe MD 08/19/2013, 12:00 PM

## 2013-08-19 NOTE — Progress Notes (Signed)
1 Day Post-Op  Subjective: Intubated, opens eyes  Objective: Vital signs in last 24 hours: Temp:  [98.1 F (36.7 C)-99.4 F (37.4 C)] 98.1 F (36.7 C) (02/02 0400) Pulse Rate:  [70-115] 113 (02/02 0800) Resp:  [20-35] 30 (02/02 0800) BP: (79-131)/(49-83) 111/69 mmHg (02/02 0800) SpO2:  [100 %] 100 % (02/02 0800) FiO2 (%):  [40 %-60 %] 40 % (02/02 0816) Weight:  [194 lb 3.6 oz (88.1 kg)-194 lb 10.7 oz (88.3 kg)] 194 lb 3.6 oz (88.1 kg) (02/02 0500)    Intake/Output from previous day: 02/01 0701 - 02/02 0700 In: 4751.2 [I.V.:2967.7; NG/GT:30; IV Piggyback:650; TPN:1103.6] Out: 7660 [Urine:570; Emesis/NG output:790; Drains:2300] Intake/Output this shift: Total I/O In: 191.8 [I.V.:106.8; TPN:85] Out: 35 [Urine:35]  General appearance: intubated Resp: coarse bilateral breath sounds decreased at bases Cardio: tachycardic GI: vac in place draining serous fluid  Lab Results:   Recent Labs  08/18/13 0404 08/19/13 0330  WBC 61.2* 43.6*  HGB 11.1* 7.9*  HCT 32.2* 22.7*  PLT 485* 329   BMET  Recent Labs  08/18/13 0404 08/19/13 0330  NA 138 138  K 4.3 4.2  CL 98 100  CO2 22 20  GLUCOSE 190* 352*  BUN 94* 115*  CREATININE 2.90* 3.35*  CALCIUM 7.2* 6.6*   PT/INR  Recent Labs  08/18/13 0404  LABPROT 15.7*  INR 1.28   ABG  Recent Labs  08/18/13 1009 08/19/13 0455  PHART 7.328* 7.400  HCO3 23.0 19.7*    Studies/Results: Ct Abdomen Pelvis W Contrast  08/17/2013   CLINICAL DATA:  Check for abscess. History of necrotic bowel. Recent transfer tall mm regional.  EXAM: CT ABDOMEN AND PELVIS WITH CONTRAST  TECHNIQUE: Multidetector CT imaging of the abdomen and pelvis was performed using the standard protocol following bolus administration of intravenous contrast.  CONTRAST:  30mL OMNIPAQUE IOHEXOL 300 MG/ML  SOLN  COMPARISON:  August 08, 2013  FINDINGS: BODY WALL: Anasarca.  LOWER CHEST: Moderate, layering, water density pleural effusions with passive  atelectasis. Despite a well-positioned nasogastric tube, there is gastroesophageal reflux into the lower esophagus at least.  ABDOMEN/PELVIS:  Liver: No focal abnormality.  Biliary: Vicarious excretion of contrast into the gallbladder.  Pancreas: Unremarkable.  Spleen: Unremarkable.  Adrenals: Unremarkable.  Kidneys and ureters: No hydronephrosis. Symmetric perinephric edema. Two right renal cysts, 17 mm in the anterior hilar lip.  Bladder: Decompressed by Foley catheter.  Reproductive: Unremarkable.  Bowel: Status post small bowel resection with enteroenterostomy x2. At the more proximal anastomosis, there is extravasation of oral contrast. Interval development of moderate pneumoperitoneum scattered throughout the abdomen. Moderate volume of ascites without definite loculated collection. There is likely some complexity to the peritoneal fluid in the ventral left lower quadrant, which can be visualized at the time of surgery. No bowel obstruction. Normal appendix.  Retroperitoneum: No mass or adenopathy.  Peritoneum: As above. Peritoneal enhancement in the lower abdomen consistent with peritonitis.  Vascular: No acute abnormality.  OSSEOUS: No acute abnormalities.  Critical Value/emergent results were called by telephone at the time of interpretation on 08/17/2013 at 10:24 PM to Dr. Pleas Patricia , who verbally acknowledged these results.  IMPRESSION: 1. Bowel perforation, likely anastomotic breakdown at the proximal enteroenterostomy. There is pneumoperitoneum and spilling of oral contrast. 2. Moderate ascites with peritonitis.  No abscess. 3. Proximal small bowel fold thickening which could be reactive to recent surgery or from ongoing bowel ischemia.   Electronically Signed   By: Jorje Guild M.D.   On: 08/17/2013 22:29  Dg Chest Port 1 View  08/19/2013   CLINICAL DATA:  Postop exploratory laparotomy for perforated viscus.  EXAM: PORTABLE CHEST - 1 VIEW  COMPARISON:  DG CHEST 1V PORT dated 08/17/2013; DG  CHEST 1V PORT dated 08/14/2013  FINDINGS: 0556 hr. Tracheostomy appears well positioned. The nasogastric tube and bilateral central lines are unchanged. There is improved aeration of the lung bases with near-complete resolution of bibasilar pulmonary opacities. No pneumothorax or significant pleural effusion is identified. The heart size and mediastinal contours are stable.  IMPRESSION: Significant interval improvement in the aeration of the lungs. Stable support system.   Electronically Signed   By: Camie Patience M.D.   On: 08/19/2013 07:15   Dg Chest Port 1 View  08/17/2013   CLINICAL DATA:  Tachycardia. Acute respiratory failure following trauma and surgery.  EXAM: PORTABLE CHEST - 1 VIEW  COMPARISON:  08/14/2013  FINDINGS: Mild improvement in bilateral airspace disease is seen since prior exam. Low lung volumes again noted. No pneumothorax identified. Support lines and tubes in appropriate position.  IMPRESSION: Mild improvement in diffuse bilateral airspace disease.   Electronically Signed   By: Earle Gell M.D.   On: 08/17/2013 10:13   Dg Abd Portable 1v  08/17/2013   CLINICAL DATA:  Increased residuals  EXAM: PORTABLE ABDOMEN - 1 VIEW  COMPARISON:  08/12/2013  FINDINGS: Nasogastric tube remains in the stomach, which is moderately distended. Small bowel decompressed. Colon nondilated. Midline abdominal staples.  IMPRESSION: 1. Mild gastric distention despite presence of nasogastric tube.   Electronically Signed   By: Arne Cleveland M.D.   On: 08/17/2013 10:16    Anti-infectives: Anti-infectives   Start     Dose/Rate Route Frequency Ordered Stop   08/18/13 1400  piperacillin-tazobactam (ZOSYN) IVPB 2.25 g     2.25 g 100 mL/hr over 30 Minutes Intravenous 3 times per day 08/18/13 1124     08/18/13 0500  piperacillin-tazobactam (ZOSYN) IVPB 3.375 g  Status:  Discontinued     3.375 g 12.5 mL/hr over 240 Minutes Intravenous 3 times per day 08/18/13 0434 08/18/13 1124      Assessment/Plan: S/p  perforated sb anastomosis into sigmoid colon  Needs to return to or tomorrow for small bowel anastomosis, ostomy. Will consent family today Appreciate ccm care Cont abx, tpn  [facility] 08/19/2013

## 2013-08-19 NOTE — Progress Notes (Signed)
UR Completed.  Vergie Living [id number] 08/19/2013

## 2013-08-19 NOTE — Progress Notes (Signed)
Pt started on hemodialysis Tx and immediately increased HR to 130-140. UF stopped with not improvement. Dr. Joelyn Oms clled and informed of this. Pt then dropped BP in the 70's and 25Gms albuming was given with little improvement. Dr. Joelyn Oms in and Tx was stopped after 1hr for pt not tolerating it.

## 2013-08-19 NOTE — H&P (Signed)
For gastrostomy tube placement.  Will check KUB.

## 2013-08-19 NOTE — Progress Notes (Signed)
eLink Physician-Brief Progress Note Patient Name: Jason Harmon DOB: 1940-10-02 MRN: [id number]  Date of Service  08/19/2013   HPI/Events of Note   Rising CBGs, surgical pt  eICU Interventions  ICU HG protocol, IV insulin if perssitent high   Intervention Category Intermediate Interventions: Hyperglycemia - evaluation and treatment  Isay Perleberg V. 08/19/2013, 12:09 AM

## 2013-08-19 NOTE — Progress Notes (Signed)
INITIAL NUTRITION ASSESSMENT  DOCUMENTATION CODES Per approved criteria  -Not Applicable   INTERVENTION:  TPN prescription per Pharmacy to meet > 90% of estimated nutrition needs as able.  NUTRITION DIAGNOSIS: Inadequate oral intake related to inability to eat as evidenced by NPO status.   Goal: Intake to meet >90% of estimated nutrition needs.  Monitor:  TPN tolerance/adequacy, weight trend, labs, vent status.  Reason for Assessment: New TPN Consult  73 y.o. male  Admitting Dx: Bowel perforation  ASSESSMENT: 73 year old male with SBO and ischemic bowel s/p resection at [facility] Was transferred to [facility] for continuing hemodialysis and inability to wean from ventilator. Admitted on 2/1 after he developed a new bowel perforation at his anastomosis site and is s/p ex lap with small bowel resection and wound vac placement. S/P trach on 1/22.   Patient is currently intubated on ventilator support.  MV: 15.9 L/min Temp (24hrs), Avg:98.6 F (37 C), Min:98.1 F (36.7 C), Max:99.4 F (37.4 C)   Patient is receiving TPN with Clinimix 5/15 @ 75 ml/hr and lipids @ 10 ml/hr. Provides 2040 ml, 1758 kcal, and 90 grams protein per day. Meets 83% minimum estimated energy needs and 75% minimum estimated protein needs.  Height: Ht Readings from Last 1 Encounters:  08/18/13 5\' 11"  (1.803 m)    Weight: Wt Readings from Last 1 Encounters:  08/19/13 194 lb 3.6 oz (88.1 kg)    Ideal Body Weight: 78.2 kg  % Ideal Body Weight: 113%  Wt Readings from Last 10 Encounters:  08/19/13 194 lb 3.6 oz (88.1 kg)  08/19/13 194 lb 3.6 oz (88.1 kg)    Usual Body Weight: unknown  % Usual Body Weight: N/A  BMI:  Body mass index is 27.1 kg/(m^2).  Estimated Nutritional Needs: Kcal: 2130 Protein: 120-135 gm Fluid: 2.1-2.3 L  Skin: stage 1 sacral wound; wound VAC to surgical abdominal wound  Diet Order: NPO  EDUCATION NEEDS: -Education not appropriate at this time   Intake/Output  Summary (Last 24 hours) at 08/19/13 5366 Last data filed at 08/19/13 0800  Gross per 24 hour  Intake 3943.03 ml  Output   3670 ml  Net 273.03 ml    Last BM: None documented since admission    Labs:   Recent Labs Lab 08/14/13 0439 08/16/13 0635  08/17/13 1125 08/18/13 0404 08/19/13 0330  NA 143 140  < > 137 138 138  K 3.4* 3.7  < > 4.0 4.3 4.2  CL 100 97  < > 97 98 100  CO2 21 22  < > 22 22 20   BUN 115* 104*  < > 77* 94* 115*  CREATININE 4.43* 3.42*  < > 2.48* 2.90* 3.35*  CALCIUM 7.1* 7.0*  < > 6.9* 7.2* 6.6*  MG  --   --   --   --   --  1.9  PHOS 8.2* 8.0*  --   --   --  8.5*  GLUCOSE 311* 363*  < > 337* 190* 352*  < > = values in this interval not displayed.  CBG (last 3)   Recent Labs  08/18/13 1537 08/18/13 2000 08/18/13 2357  GLUCAP 125* 208* 310*    Scheduled Meds: . antiseptic oral rinse  15 mL Mouth Rinse QID  . chlorhexidine  15 mL Mouth Rinse BID  . insulin aspart  2-6 Units Subcutaneous Q4H  . pantoprazole (PROTONIX) IV  40 mg Intravenous QHS  . piperacillin-tazobactam (ZOSYN)  IV  2.25 g Intravenous Q8H  Continuous Infusions: . sodium chloride 100 mL/hr at 08/19/13 0450  . TPN (CLINIMIX) Adult without lytes 75 mL/hr at 08/18/13 1900   And  . fat emulsion 250 mL (08/18/13 1900)  . insulin (NOVOLIN-R) infusion 6.8 mL/hr at 08/19/13 0800  . norepinephrine (LEVOPHED) Adult infusion Stopped (08/19/13 0500)  . vasopressin (PITRESSIN) infusion - *FOR SHOCK* Stopped (08/19/13 0500)    Past Medical History  Diagnosis Date  . Renal disorder   . A-fib   . Sepsis   . Ischemic bowel disease   . Acute renal failure     Past Surgical History  Procedure Laterality Date  . Abdominal surgery       Molli Barrows, RD, LDN, [place] Pager [phone] After Hours Pager [phone]

## 2013-08-19 NOTE — Procedures (Signed)
I was present at this dialysis session. I have reviewed the session itself and made appropriate changes.   Pt with worsening tachycardia and hypotension during treatment despite IV albumin and reducing temperature requiring treatment to be stopped after 1h.  Will need to follow and either reattempt if seemingly more stable hemodyanmics or consider CRRT.    Pearson Grippe  MD 08/19/2013, 3:20 PM

## 2013-08-20 ENCOUNTER — Encounter (HOSPITAL_COMMUNITY): Payer: Self-pay | Admitting: Anesthesiology

## 2013-08-20 ENCOUNTER — Inpatient Hospital Stay (HOSPITAL_COMMUNITY): Payer: Medicare Other | Admitting: Anesthesiology

## 2013-08-20 ENCOUNTER — Encounter (HOSPITAL_COMMUNITY): Payer: Medicare Other | Admitting: Anesthesiology

## 2013-08-20 ENCOUNTER — Encounter (HOSPITAL_COMMUNITY)
Admission: EM | Disposition: A | Discharge status: Nursing Home (Includes ICF) | Payer: Self-pay | Source: Home / Self Care | Attending: Pulmonary Disease

## 2013-08-20 ENCOUNTER — Inpatient Hospital Stay (HOSPITAL_COMMUNITY): Payer: Medicare Other

## 2013-08-20 DIAGNOSIS — A419 Sepsis, unspecified organism: Secondary | ICD-10-CM

## 2013-08-20 DIAGNOSIS — Z930 Tracheostomy status: Secondary | ICD-10-CM

## 2013-08-20 DIAGNOSIS — R6521 Severe sepsis with septic shock: Secondary | ICD-10-CM

## 2013-08-20 DIAGNOSIS — N179 Acute kidney failure, unspecified: Secondary | ICD-10-CM

## 2013-08-20 DIAGNOSIS — R6520 Severe sepsis without septic shock: Secondary | ICD-10-CM

## 2013-08-20 HISTORY — PX: COLOSTOMY: SHX63

## 2013-08-20 HISTORY — PX: GASTROSTOMY: SHX5249

## 2013-08-20 HISTORY — PX: LAPAROTOMY: SHX154

## 2013-08-20 LAB — GLUCOSE, CAPILLARY
GLUCOSE-CAPILLARY: 101 mg/dL — AB (ref 70–99)
GLUCOSE-CAPILLARY: 103 mg/dL — AB (ref 70–99)
GLUCOSE-CAPILLARY: 105 mg/dL — AB (ref 70–99)
GLUCOSE-CAPILLARY: 123 mg/dL — AB (ref 70–99)
GLUCOSE-CAPILLARY: 124 mg/dL — AB (ref 70–99)
GLUCOSE-CAPILLARY: 125 mg/dL — AB (ref 70–99)
GLUCOSE-CAPILLARY: 126 mg/dL — AB (ref 70–99)
GLUCOSE-CAPILLARY: 127 mg/dL — AB (ref 70–99)
GLUCOSE-CAPILLARY: 132 mg/dL — AB (ref 70–99)
GLUCOSE-CAPILLARY: 133 mg/dL — AB (ref 70–99)
GLUCOSE-CAPILLARY: 135 mg/dL — AB (ref 70–99)
GLUCOSE-CAPILLARY: 137 mg/dL — AB (ref 70–99)
GLUCOSE-CAPILLARY: 139 mg/dL — AB (ref 70–99)
GLUCOSE-CAPILLARY: 153 mg/dL — AB (ref 70–99)
GLUCOSE-CAPILLARY: 188 mg/dL — AB (ref 70–99)
GLUCOSE-CAPILLARY: 197 mg/dL — AB (ref 70–99)
Glucose-Capillary: 339 mg/dL — ABNORMAL HIGH (ref 70–99)
Glucose-Capillary: 344 mg/dL — ABNORMAL HIGH (ref 70–99)
Glucose-Capillary: 305 mg/dL — ABNORMAL HIGH (ref 70–99)
Glucose-Capillary: 261 mg/dL — ABNORMAL HIGH (ref 70–99)
Glucose-Capillary: 231 mg/dL — ABNORMAL HIGH (ref 70–99)
Glucose-Capillary: 179 mg/dL — ABNORMAL HIGH (ref 70–99)
Glucose-Capillary: 130 mg/dL — ABNORMAL HIGH (ref 70–99)
Glucose-Capillary: 89 mg/dL (ref 70–99)
Glucose-Capillary: 92 mg/dL (ref 70–99)
Glucose-Capillary: 123 mg/dL — ABNORMAL HIGH (ref 70–99)
Glucose-Capillary: 123 mg/dL — ABNORMAL HIGH (ref 70–99)
Glucose-Capillary: 114 mg/dL — ABNORMAL HIGH (ref 70–99)
Glucose-Capillary: 114 mg/dL — ABNORMAL HIGH (ref 70–99)
Glucose-Capillary: 115 mg/dL — ABNORMAL HIGH (ref 70–99)
Glucose-Capillary: 138 mg/dL — ABNORMAL HIGH (ref 70–99)
Glucose-Capillary: 141 mg/dL — ABNORMAL HIGH (ref 70–99)
Glucose-Capillary: 130 mg/dL — ABNORMAL HIGH (ref 70–99)
Glucose-Capillary: 118 mg/dL — ABNORMAL HIGH (ref 70–99)
Glucose-Capillary: 124 mg/dL — ABNORMAL HIGH (ref 70–99)
Glucose-Capillary: 122 mg/dL — ABNORMAL HIGH (ref 70–99)
Glucose-Capillary: 109 mg/dL — ABNORMAL HIGH (ref 70–99)
Glucose-Capillary: 115 mg/dL — ABNORMAL HIGH (ref 70–99)

## 2013-08-20 LAB — CBC
HCT: 18.6 % — ABNORMAL LOW (ref 39.0–52.0)
Hemoglobin: 6.5 g/dL — CL (ref 13.0–17.0)
MCH: 30.7 pg (ref 26.0–34.0)
MCHC: 34.9 g/dL (ref 30.0–36.0)
MCV: 87.7 fL (ref 78.0–100.0)
PLATELETS: 271 10*3/uL (ref 150–400)
RBC: 2.12 MIL/uL — ABNORMAL LOW (ref 4.22–5.81)
RDW: 14.6 % (ref 11.5–15.5)
WBC: 34.2 10*3/uL — ABNORMAL HIGH (ref 4.0–10.5)

## 2013-08-20 LAB — RENAL FUNCTION PANEL
Albumin: 1.5 g/dL — ABNORMAL LOW (ref 3.5–5.2)
BUN: 95 mg/dL — ABNORMAL HIGH (ref 6–23)
CALCIUM: 6.7 mg/dL — AB (ref 8.4–10.5)
CO2: 21 mEq/L (ref 19–32)
Chloride: 100 mEq/L (ref 96–112)
Creatinine, Ser: 2.95 mg/dL — ABNORMAL HIGH (ref 0.50–1.35)
GFR, EST AFRICAN AMERICAN: 23 mL/min — AB (ref >90)
GFR, EST NON AFRICAN AMERICAN: 20 mL/min — AB (ref >90)
Glucose, Bld: 128 mg/dL — ABNORMAL HIGH (ref 70–99)
PHOSPHORUS: 5.9 mg/dL — AB (ref 2.3–4.6)
POTASSIUM: 3.4 meq/L — AB (ref 3.7–5.3)
SODIUM: 137 meq/L (ref 137–147)

## 2013-08-20 LAB — BASIC METABOLIC PANEL
BUN: 95 mg/dL — ABNORMAL HIGH (ref 6–23)
CHLORIDE: 101 meq/L (ref 96–112)
CO2: 18 meq/L — AB (ref 19–32)
CREATININE: 2.87 mg/dL — AB (ref 0.50–1.35)
Calcium: 6.5 mg/dL — ABNORMAL LOW (ref 8.4–10.5)
GFR calc Af Amer: 24 mL/min — ABNORMAL LOW (ref >90)
GFR calc non Af Amer: 21 mL/min — ABNORMAL LOW (ref >90)
GLUCOSE: 136 mg/dL — AB (ref 70–99)
Potassium: 3.7 mEq/L (ref 3.7–5.3)
Sodium: 137 mEq/L (ref 137–147)

## 2013-08-20 LAB — PREPARE RBC (CROSSMATCH)

## 2013-08-20 LAB — MAGNESIUM: Magnesium: 1.8 mg/dL (ref 1.5–2.5)

## 2013-08-20 SURGERY — LAPAROTOMY, EXPLORATORY
Anesthesia: General | Site: Abdomen

## 2013-08-20 MED ORDER — GLYCOPYRROLATE 0.2 MG/ML IJ SOLN
INTRAMUSCULAR | Status: AC
  Filled 2013-08-20: qty 3

## 2013-08-20 MED ORDER — SODIUM CHLORIDE 0.9 % IV SOLN
INTRAVENOUS | Status: DC | PRN
  Administered 2013-08-20: 10:00:00 via INTRAVENOUS
  Administered 2013-08-21: 1000 mL

## 2013-08-20 MED ORDER — LACTATED RINGERS IV SOLN: INTRAVENOUS | Status: DC | PRN

## 2013-08-20 MED ORDER — FENTANYL CITRATE 0.05 MG/ML IJ SOLN
INTRAMUSCULAR | Status: DC | PRN
  Administered 2013-08-20 (×3): 50 ug via INTRAVENOUS
  Administered 2013-08-20: 100 ug via INTRAVENOUS

## 2013-08-20 MED ORDER — FAT EMULSION 20 % IV EMUL
250.0000 mL | INTRAVENOUS | Status: AC
Start: 2013-08-20 — End: 2013-08-21
  Administered 2013-08-20: 250 mL via INTRAVENOUS
  Filled 2013-08-20: qty 250

## 2013-08-20 MED ORDER — GLYCOPYRROLATE 0.2 MG/ML IJ SOLN
INTRAMUSCULAR | Status: DC | PRN
  Administered 2013-08-20: 0.6 mg via INTRAVENOUS

## 2013-08-20 MED ORDER — PROPOFOL 10 MG/ML IV BOLUS
INTRAVENOUS | Status: DC | PRN
  Administered 2013-08-20: 50 mg via INTRAVENOUS

## 2013-08-20 MED ORDER — ALBUMIN HUMAN 5 % IV SOLN
INTRAVENOUS | Status: DC | PRN
  Administered 2013-08-20 (×2): via INTRAVENOUS

## 2013-08-20 MED ORDER — MIDAZOLAM HCL 2 MG/2ML IJ SOLN
INTRAMUSCULAR | Status: AC
  Filled 2013-08-20: qty 2

## 2013-08-20 MED ORDER — NEOSTIGMINE METHYLSULFATE 1 MG/ML IJ SOLN
INTRAMUSCULAR | Status: DC | PRN
  Administered 2013-08-20: 4 mg via INTRAVENOUS

## 2013-08-20 MED ORDER — 0.9 % SODIUM CHLORIDE (POUR BTL) OPTIME
TOPICAL | Status: DC | PRN
  Administered 2013-08-20 (×5): 1000 mL

## 2013-08-20 MED ORDER — POTASSIUM CHLORIDE 10 MEQ/100ML IV SOLN
10.0000 meq | INTRAVENOUS | Status: AC
  Administered 2013-08-20 (×2): 10 meq via INTRAVENOUS
  Filled 2013-08-20: qty 100

## 2013-08-20 MED ORDER — PROPOFOL 10 MG/ML IV BOLUS
INTRAVENOUS | Status: AC
Start: 2013-08-20 — End: 2013-08-20
  Filled 2013-08-20: qty 20

## 2013-08-20 MED ORDER — PHENYLEPHRINE HCL 10 MG/ML IJ SOLN
INTRAMUSCULAR | Status: DC | PRN
  Administered 2013-08-20: 160 ug via INTRAVENOUS

## 2013-08-20 MED ORDER — EPHEDRINE SULFATE 50 MG/ML IJ SOLN
INTRAMUSCULAR | Status: AC
  Filled 2013-08-20: qty 1

## 2013-08-20 MED ORDER — PHENYLEPHRINE HCL 10 MG/ML IJ SOLN
INTRAMUSCULAR | Status: AC
  Filled 2013-08-20: qty 1

## 2013-08-20 MED ORDER — SODIUM CHLORIDE 0.9 % IJ SOLN
INTRAMUSCULAR | Status: AC
  Filled 2013-08-20: qty 10

## 2013-08-20 MED ORDER — TRACE MINERALS CR-CU-F-FE-I-MN-MO-SE-ZN IV SOLN
INTRAVENOUS | Status: AC
  Administered 2013-08-20: 18:00:00 via INTRAVENOUS
  Filled 2013-08-20: qty 2000

## 2013-08-20 MED ORDER — PHENYLEPHRINE HCL 10 MG/ML IJ SOLN
10.0000 mg | INTRAVENOUS | Status: DC | PRN
  Administered 2013-08-20 (×3): 50 ug/min via INTRAVENOUS

## 2013-08-20 MED ORDER — MIDAZOLAM HCL 5 MG/5ML IJ SOLN
INTRAMUSCULAR | Status: DC | PRN
  Administered 2013-08-20: 2 mg via INTRAVENOUS

## 2013-08-20 MED ORDER — FENTANYL CITRATE 0.05 MG/ML IJ SOLN
INTRAMUSCULAR | Status: AC
  Filled 2013-08-20: qty 5

## 2013-08-20 MED ORDER — ROCURONIUM BROMIDE 50 MG/5ML IV SOLN
INTRAVENOUS | Status: AC
  Filled 2013-08-20: qty 1

## 2013-08-20 MED ORDER — LIDOCAINE HCL (CARDIAC) 20 MG/ML IV SOLN
INTRAVENOUS | Status: AC
  Filled 2013-08-20: qty 5

## 2013-08-20 MED ORDER — VECURONIUM BROMIDE 10 MG IV SOLR
INTRAVENOUS | Status: AC
  Filled 2013-08-20: qty 10

## 2013-08-20 SURGICAL SUPPLY — 68 items
BLADE SURG ROTATE 9660 (MISCELLANEOUS) IMPLANT
CANISTER SUCTION 2500CC (MISCELLANEOUS) ×9 IMPLANT
CANISTER WOUND CARE 500ML ATS (WOUND CARE) ×3 IMPLANT
CATH MALECOT BARD  24FR (CATHETERS) ×2
CATH MALECOT BARD 24FR (CATHETERS) ×1 IMPLANT
CATH ROBINSON RED A/P 16FR (CATHETERS) IMPLANT
CATH ROBINSON RED A/P 18FR (CATHETERS) IMPLANT
CATH ROBINSON RED A/P 20FR (CATHETERS) IMPLANT
CHLORAPREP W/TINT 26ML (MISCELLANEOUS) ×3 IMPLANT
COVER MAYO STAND STRL (DRAPES) ×3 IMPLANT
COVER SURGICAL LIGHT HANDLE (MISCELLANEOUS) ×3 IMPLANT
DRAPE LAPAROSCOPIC ABDOMINAL (DRAPES) ×3 IMPLANT
DRAPE PROXIMA HALF (DRAPES) IMPLANT
DRAPE UTILITY 15X26 W/TAPE STR (DRAPE) ×6 IMPLANT
DRAPE WARM FLUID 44X44 (DRAPE) ×3 IMPLANT
DRSG OPSITE POSTOP 4X10 (GAUZE/BANDAGES/DRESSINGS) IMPLANT
DRSG OPSITE POSTOP 4X8 (GAUZE/BANDAGES/DRESSINGS) IMPLANT
DRSG VAC ATS LRG SENSATRAC (GAUZE/BANDAGES/DRESSINGS) ×3 IMPLANT
ELECT BLADE 6.5 EXT (BLADE) IMPLANT
ELECT CAUTERY BLADE 6.4 (BLADE) ×3 IMPLANT
ELECT REM PT RETURN 9FT ADLT (ELECTROSURGICAL) ×3
ELECTRODE REM PT RTRN 9FT ADLT (ELECTROSURGICAL) ×1 IMPLANT
GLOVE BIO SURGEON STRL SZ7 (GLOVE) ×6 IMPLANT
GLOVE BIO SURGEON STRL SZ8 (GLOVE) ×3 IMPLANT
GLOVE BIOGEL PI IND STRL 7.0 (GLOVE) ×3 IMPLANT
GLOVE BIOGEL PI IND STRL 7.5 (GLOVE) ×1 IMPLANT
GLOVE BIOGEL PI IND STRL 8 (GLOVE) ×1 IMPLANT
GLOVE BIOGEL PI INDICATOR 7.0 (GLOVE) ×6
GLOVE BIOGEL PI INDICATOR 7.5 (GLOVE) ×2
GLOVE BIOGEL PI INDICATOR 8 (GLOVE) ×2
GLOVE SURG SS PI 7.0 STRL IVOR (GLOVE) ×6 IMPLANT
GOWN STRL NON-REIN LRG LVL3 (GOWN DISPOSABLE) ×6 IMPLANT
GOWN STRL REIN XL XLG (GOWN DISPOSABLE) ×3 IMPLANT
KIT BASIN OR (CUSTOM PROCEDURE TRAY) ×3 IMPLANT
KIT OSTOMY DRAINABLE 2.75 STR (WOUND CARE) ×3 IMPLANT
KIT ROOM TURNOVER OR (KITS) ×3 IMPLANT
LIGASURE IMPACT 36 18CM CVD LR (INSTRUMENTS) IMPLANT
NS IRRIG 1000ML POUR BTL (IV SOLUTION) ×15 IMPLANT
PACK GENERAL/GYN (CUSTOM PROCEDURE TRAY) ×3 IMPLANT
PAD ABD 8X10 STRL (GAUZE/BANDAGES/DRESSINGS) ×3 IMPLANT
PAD ARMBOARD 7.5X6 YLW CONV (MISCELLANEOUS) ×3 IMPLANT
PENCIL BUTTON HOLSTER BLD 10FT (ELECTRODE) IMPLANT
RELOAD PROXIMATE 75MM BLUE (ENDOMECHANICALS) ×3 IMPLANT
SPECIMEN JAR LARGE (MISCELLANEOUS) IMPLANT
SPONGE GAUZE 4X4 12PLY (GAUZE/BANDAGES/DRESSINGS) ×3 IMPLANT
SPONGE LAP 18X18 X RAY DECT (DISPOSABLE) ×3 IMPLANT
STAPLER GUN LINEAR PROX 60 (STAPLE) ×3 IMPLANT
STAPLER PROXIMATE 75MM BLUE (STAPLE) ×3 IMPLANT
STAPLER VISISTAT 35W (STAPLE) ×3 IMPLANT
SUCTION POOLE TIP (SUCTIONS) ×3 IMPLANT
SUT ETHILON 2 0 FS 18 (SUTURE) ×9 IMPLANT
SUT PDS AB 1 TP1 96 (SUTURE) ×6 IMPLANT
SUT PROLENE 0 CT 1 CR/8 (SUTURE) ×3 IMPLANT
SUT SILK 2 0 (SUTURE) ×2
SUT SILK 2 0 SH CR/8 (SUTURE) ×6 IMPLANT
SUT SILK 2-0 18XBRD TIE 12 (SUTURE) ×1 IMPLANT
SUT SILK 3 0 (SUTURE) ×2
SUT SILK 3 0 SH CR/8 (SUTURE) ×3 IMPLANT
SUT SILK 3-0 18XBRD TIE 12 (SUTURE) ×1 IMPLANT
SUT VIC AB 3-0 SH 27 (SUTURE) ×2
SUT VIC AB 3-0 SH 27X BRD (SUTURE) ×1 IMPLANT
TAPE CLOTH SURG 4X10 WHT LF (GAUZE/BANDAGES/DRESSINGS) ×3 IMPLANT
TOWEL OR 17X24 6PK STRL BLUE (TOWEL DISPOSABLE) ×3 IMPLANT
TOWEL OR 17X26 10 PK STRL BLUE (TOWEL DISPOSABLE) ×3 IMPLANT
TRAY FOLEY CATH 16FRSI W/METER (SET/KITS/TRAYS/PACK) IMPLANT
TUBE CONNECTING 12'X1/4 (SUCTIONS)
TUBE CONNECTING 12X1/4 (SUCTIONS) IMPLANT
YANKAUER SUCT BULB TIP NO VENT (SUCTIONS) ×3 IMPLANT

## 2013-08-20 NOTE — Op Note (Signed)
Preoperative diagnosis: Open abdomen status post leakage of small bowel anastomosis Postoperative diagnosis: Same as above Procedure: #1 small bowel anastomosis #2 Stamm gastrostomy #3 End sigmoid colostomy Surgeon: Dr. Serita Grammes Asst.: Dr. Georganna Skeans Anesthesia: Gen. Estimated blood loss: Minimal Complications: None Drains: None Specimens: None Disposition to ICU Sponge needle count was correct completion  Indications: This is a 73 year old male with previously undergone a small bowel resection for small bowel obstruction at an outside hospital. He was admitted here this weekend with leakage from what appeared to be his anastomosis and a fistula into the sigmoid colon. He was taken to the operating room by Dr. Brantley Stage and underwent small bowel resection without anastomosis due to contamination and his clinical condition at that time. He also resection of his sigmoid colon. He has done better in the ICU since then and he is returned to day for likely colostomy, gastrostomy, and small bowel anastomosis. I discussed this with his wife and daughter prior to beginning.  Procedure: After informed consent was obtained the patient's family he was taken to the operating room. He is maintained on antibiotics in the ICU already. He had sequential compression devices on his legs. He was then placed under a general anesthetic without complication. He did have some hypotension. This was managed by volume. His vac was then removed. His abdomen was prepped and draped in the standard sterile surgical fashion. A surgical timeout was performed.  I reevaluated his abdomen. I irrigated this copiously. I ran the small bowel from the ligament of Treitz all the way to the cecum. The area was tacked together that needed to be reanastomosed. I then brought these together with 3-0 silk sutures. I created enterotomies in both sides and created a common enterotomy with a GIA stapler. I then closed the defect with a  TX stapler. I placed 2 crotch stitches of 3-0 silk. I oversewed the staple line completely with 3-0 silk to close the mesenteric defect as well. I did place some omentum overlying this also. His bowel was in good condition but I think there is still a high risk of having issues from this given his clinical course over the last almost month. I then identified his sigmoid colon. This had been mobilized previously. I brought this through the skin and it appeared to come without any tension. I then evaluated the stomach. I placed 2 rows of 2-0 silk suture. I then placed a 24 French Malecot tube into the stomach and brought this out to the left upper quadrant. I then put this in the stomach and secured it with 2-0 silk pursestring sutures that I already placed. The NG tube was not caught in this. I then tacked the gastrostomy to the abdominal wall with 2-0 silk in a Stamm fashion. This was then secured with 2-0 nylon suture and a Foley bag placed at the end. I then closed the abdomen with #1 loop PDS and interrupted 0 Prolene sutures. His peak pressures did not increase substantially at all during this procedure and he closed fairly easily. I then matured the ostomy with 3-0 Vicryl. It appeared viable. I then placed a bag over this. I placed a dressing. He tolerated this fairly well was transferred back to the ICU.

## 2013-08-20 NOTE — Anesthesia Procedure Notes (Signed)
Date/Time: 08/20/2013 9:56 AM Performed by: Daisy Lazar Pre-anesthesia Checklist: Patient identified, Emergency Drugs available, Suction available and Patient being monitored Patient Re-evaluated:Patient Re-evaluated prior to inductionOxygen Delivery Method: Circle system utilized Preoxygenation: Pre-oxygenation with 100% oxygen Intubation Type: Tracheostomy and Combination inhalational/ intravenous induction Placement Confirmation: positive ETCO2 and breath sounds checked- equal and bilateral

## 2013-08-20 NOTE — Progress Notes (Signed)
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    Subjective:  Unable to complete full iHD yesterday 2/2 tachycardia, hypotension To OR today Transfusion this AM Off Pressors UOP >1L  02/02 0701 - 02/03 0700 In: 3840.1 [I.V.:1642.6; Blood:162.5; NG/GT:20; TPN:2015] Out: 3021 [Urine:1015; Emesis/NG output:900; Drains:1400]  Filed Weights   08/19/13 0500 08/19/13 1400 08/20/13 0500  Weight: 88.1 kg (194 lb 3.6 oz) 88.1 kg (194 lb 3.6 oz) 88.1 kg (194 lb 3.6 oz)    Current meds: reviewed Current Labs: reviewed   Physical Exam:  Blood pressure 148/76, pulse 105, temperature 98.3 F (36.8 C), temperature source Oral, resp. rate 33, height 5\' 11"  (1.803 m), weight 88.1 kg (194 lb 3.6 oz), SpO2 100.00%. GEN: chronically ill appaering, itubated, sedated  ENT: Trach and R IJ nontunneled HD Catheter noted  EYES: EOMI. Eyes open to noise, stimullus  CV: tachy, regular  PULM: +Bs b/l  ABD: wound vac at midline  SKIN: large ventral abd incision  EXT: 2+ pitting edema b/l  Assessment/Plan 1. Dialysis Dependent AKI, progressing to ESRD: On tentative MWF schedule but will assess daily.  TDC pending resolved infectious issues.  K/HCO3 stable today, BUN 95, improved from previous and multifactorial causes.  1L UOP favorable for volume status, not sure of how good quality it is.   2. Anemia: Transfusing today.  Per surgery and CCM.  If stabilizes can consider using ESA 3. Septic shock and SB performation: per surgery and CCM  Pearson Grippe MD 08/20/2013, 8:49 AM   Recent Labs Lab 08/16/13 0635  08/18/13 0404 08/19/13 0330 08/20/13 0430  NA 140  < > 138 138 137  K 3.7  < > 4.3 4.2 3.4*  CL 97  < > 98 100 100  CO2 22  < > 22 20 21   GLUCOSE 363*  < > 190* 352* 128*  BUN 104*  < > 94* 115* 95*  CREATININE 3.42*  < > 2.90* 3.35* 2.95*  CALCIUM 7.0*  < > 7.2* 6.6* 6.7*  PHOS  8.0*  --   --  8.5* 5.9*  < > = values in this interval not displayed.  Recent Labs Lab 08/18/13 0404 08/19/13 0330 08/20/13 0430  WBC 61.2* 43.6* 34.2*  NEUTROABS 58.8*  --   --   HGB 11.1* 7.9* 6.5*  HCT 32.2* 22.7* 18.6*  MCV 88.2 88.0 87.7  PLT 485* 329 271

## 2013-08-20 NOTE — Transfer of Care (Signed)
Immediate Anesthesia Transfer of Care Note  Patient: Jason Harmon  Procedure(s) Performed: Procedure(s): EXPLORATORY LAPAROTOMY WITH  SMALL BOWEL RESECTION (N/A) G-TUBE PLACEMENT (N/A) COLOSTOMY (N/A)  Patient Location: SICU  Anesthesia Type:General  Level of Consciousness: sedated and Patient remains intubated per anesthesia plan  Airway & Oxygen Therapy: Patient Spontanous Breathing, Patient remains intubated per anesthesia plan, Patient placed on Ventilator (see vital sign flow sheet for setting) and Trach  Post-op Assessment: Report given to PACU RN and Post -op Vital signs reviewed and stable  Post vital signs: Reviewed and stable  Complications: No apparent anesthesia complications

## 2013-08-20 NOTE — Preoperative (Signed)
Beta Blockers   Reason not to administer Beta Blockers:Not Applicable 

## 2013-08-20 NOTE — Progress Notes (Signed)
2 Days Post-Op  Subjective: intubated  Objective: Vital signs in last 24 hours: Temp:  [98.1 F (36.7 C)-99.3 F (37.4 C)] 99.3 F (37.4 C) (02/03 0724) Pulse Rate:  [100-141] 106 (02/03 0700) Resp:  [13-48] 30 (02/03 0700) BP: (71-145)/(42-84) 129/71 mmHg (02/03 0700) SpO2:  [98 %-100 %] 100 % (02/03 0700) FiO2 (%):  [40 %] 40 % (02/03 0605) Weight:  [194 lb 3.6 oz (88.1 kg)] 194 lb 3.6 oz (88.1 kg) (02/03 0500)    Intake/Output from previous day: 02/02 0701 - 02/03 0700 In: 3840.1 [I.V.:1642.6; Blood:162.5; NG/GT:20; TPN:2015] Out: 3021 [Urine:1015; Emesis/NG output:900; Drains:1400] Intake/Output this shift:    General appearance: no distress Resp: coarse bilateral breath sounds Cardio: tachycardic GI: vac in place draining serous fluid  Lab Results:   Recent Labs  08/19/13 0330 08/20/13 0430  WBC 43.6* 34.2*  HGB 7.9* 6.5*  HCT 22.7* 18.6*  PLT 329 271   BMET  Recent Labs  08/19/13 0330 08/20/13 0430  NA 138 137  K 4.2 3.4*  CL 100 100  CO2 20 21  GLUCOSE 352* 128*  BUN 115* 95*  CREATININE 3.35* 2.95*  CALCIUM 6.6* 6.7*   PT/INR  Recent Labs  08/18/13 0404  LABPROT 15.7*  INR 1.28   ABG  Recent Labs  08/18/13 1009 08/19/13 0455  PHART 7.328* 7.400  HCO3 23.0 19.7*    Studies/Results: Dg Chest Port 1 View  08/19/2013   CLINICAL DATA:  Postop exploratory laparotomy for perforated viscus.  EXAM: PORTABLE CHEST - 1 VIEW  COMPARISON:  DG CHEST 1V PORT dated 08/17/2013; DG CHEST 1V PORT dated 08/14/2013  FINDINGS: 0556 hr. Tracheostomy appears well positioned. The nasogastric tube and bilateral central lines are unchanged. There is improved aeration of the lung bases with near-complete resolution of bibasilar pulmonary opacities. No pneumothorax or significant pleural effusion is identified. The heart size and mediastinal contours are stable.  IMPRESSION: Significant interval improvement in the aeration of the lungs. Stable support system.    Electronically Signed   By: Camie Patience M.D.   On: 08/19/2013 07:15    Anti-infectives: Anti-infectives   Start     Dose/Rate Route Frequency Ordered Stop   08/18/13 1400  piperacillin-tazobactam (ZOSYN) IVPB 2.25 g     2.25 g 100 mL/hr over 30 Minutes Intravenous 3 times per day 08/18/13 1124     08/18/13 0500  piperacillin-tazobactam (ZOSYN) IVPB 3.375 g  Status:  Discontinued     3.375 g 12.5 mL/hr over 240 Minutes Intravenous 3 times per day 08/18/13 0434 08/18/13 1124      Assessment/Plan: POD 2  To OR today for small bowel anastomosis, colostomy, gastrostomy tube Transfuse prior to or Hypokalemia- Replace potassium this am  [facility] 08/20/2013

## 2013-08-20 NOTE — Plan of Care (Signed)
Problem: Phase I Progression Outcomes Goal: OOB as tolerated unless otherwise ordered Outcome: Not Applicable Date Met:  49/97/18 Bedrest ordered, plann is for pt to go back to surgery in am. Goal: Incision/dressings dry and intact Outcome: Not Applicable Date Met:  20/99/06 Wound vac is sealed and intact

## 2013-08-20 NOTE — Progress Notes (Signed)
eLink Physician-Brief Progress Note Patient Name: Jason Harmon DOB: 12/04/1940 MRN: [id number]  Date of Service  08/20/2013   HPI/Events of Note  Hgb down to 6.5 from 7.9   eICU Interventions  Plan: Transfuse 1 unit of pRBC Post-transfusion CBC Scheduled for return to surgery today.   Intervention Category Intermediate Interventions: Bleeding - evaluation and treatment with blood products  Latunya Kissick 08/20/2013, 5:26 AM

## 2013-08-20 NOTE — Anesthesia Preprocedure Evaluation (Signed)
Anesthesia Evaluation  Patient identified by MRN, date of birth, ID band Patient awake    Reviewed: Allergy & Precautions, H&P , NPO status , Patient's Chart, lab work & pertinent test results  Airway      Comment: Tracheostomy in place. Dental   Pulmonary  Tracheostomy in place. H/o acute respiratory failure.         Cardiovascular + dysrhythmias Atrial Fibrillation  Sepsis from abdominal process.   Neuro/Psych    GI/Hepatic H/o bowel resection with complicating leak leading to sepsis.   Endo/Other    Renal/GU ARF and DialysisRenal disease     Musculoskeletal   Abdominal   Peds  Hematology   Anesthesia Other Findings   Reproductive/Obstetrics                           Anesthesia Physical Anesthesia Plan  ASA: IV  Anesthesia Plan: General   Post-op Pain Management:    Induction: Intravenous  Airway Management Planned: Tracheostomy  Additional Equipment: Arterial line and CVP  Intra-op Plan:   Post-operative Plan: Post-operative intubation/ventilation  Informed Consent: I have reviewed the patients History and Physical, chart, labs and discussed the procedure including the risks, benefits and alternatives for the proposed anesthesia with the patient or authorized representative who has indicated his/her understanding and acceptance.     Plan Discussed with: CRNA, Anesthesiologist and Surgeon  Anesthesia Plan Comments:         Anesthesia Quick Evaluation

## 2013-08-20 NOTE — Progress Notes (Signed)
PARENTERAL NUTRITION CONSULT NOTE - FOLLOW UP  Pharmacy Consult for TPN Indication: perforated viscous  No Known Allergies  Patient Measurements: Height: 5\' 11"  (180.3 cm) (5'11") Weight: 194 lb 3.6 oz (88.1 kg) (Bed Scale not accurate will rezero when in surgery this am.) IBW/kg (Calculated) : 75.3 Weight at [facility] 89.3 kg  Vital Signs: Temp: 98.9 F (37.2 C) (02/03 0900) Temp src: Oral (02/03 0900) BP: 150/80 mmHg (02/03 0900) Pulse Rate: 107 (02/03 0900) Intake/Output from previous day: 02/02 0701 - 02/03 0700 In: 3840.1 [I.V.:1642.6; Blood:162.5; NG/GT:20; TPN:2015] Out: 3021 [Urine:1015; Emesis/NG output:900; Drains:1400] Intake/Output from this shift: Total I/O In: 649.6 [I.V.:107.1; Blood:162.5; IV Piggyback:200; TPN:180] Out: 700 [Urine:200; Emesis/NG output:200; GURKYH:062]  Labs:  Recent Labs  08/18/13 0404 08/19/13 0330 08/20/13 0430  WBC 61.2* 43.6* 34.2*  HGB 11.1* 7.9* 6.5*  HCT 32.2* 22.7* 18.6*  PLT 485* 329 271  APTT 30  --   --   INR 1.28  --   --      Recent Labs  08/17/13 1125 08/18/13 0404 08/19/13 0330 08/20/13 0430  NA 137 138 138 137  K 4.0 4.3 4.2 3.4*  CL 97 98 100 100  CO2 22 22 20 21   GLUCOSE 337* 190* 352* 128*  BUN 77* 94* 115* 95*  CREATININE 2.48* 2.90* 3.35* 2.95*  CALCIUM 6.9* 7.2* 6.6* 6.7*  MG  --   --  1.9 1.8  PHOS  --   --  8.5* 5.9*  PROT 4.6* 4.7* 3.7*  --   ALBUMIN 1.5* 1.5* 1.4* 1.5*  AST 20 16 17   --   ALT 11 11 11   --   ALKPHOS 60 63 52  --   BILITOT 0.6 0.8 0.5  --   BILIDIR 0.3  --   --   --   IBILI 0.3  --   --   --   PREALBUMIN  --   --  11.5*  --   TRIG  --   --  291*  --    Estimated Creatinine Clearance: 24.8 ml/min (by C-G formula based on Cr of 2.95).    Recent Labs  08/20/13 0046 08/20/13 0201 08/20/13 0307  GLUCAP 135* 103* 114*    Medications:  Infusions:  . [MAR HOLD] TPN (CLINIMIX) Adult without lytes 80 mL/hr at 08/19/13 1808   And  . [facility] HOLD] fat emulsion 250 mL (08/19/13  1808)  . insulin (NOVOLIN-R) infusion 3.7 Units/hr (08/20/13 0900)  . vasopressin (PITRESSIN) infusion - *FOR SHOCK* Stopped (08/19/13 0500)    Insulin Requirements in the past 24 hours:  Started on insulin drip 2/2.   Current Nutrition:  At [facility], was on Clinimix E 5/20 at 65 ml/hr. 2/1 chaned to Clinimix E 5/15 at 75 ml/hr + lipids at 10 mlg/hr  Nutritional Goals: per RD note 2/2 2130 kCal, 120-135 grams of protein per day  Admit: 73 year old male with SBO and ischemic bowel s/p resection at [facility].  Was transferred to [facility] for continuing hemodialysis and inability to wean from ventilator.  On Sunday 2/1 developed a new bowel perforation at his anastomosis site and is s/p ex lap with small bowel resection and wound vac placement. 2/2 did not tolerate HD, got 1 hr 2/3: To OR today for small bowel anastomosis, colostomy, gastrostomy tube, blood transfusion pre op for Hg 5.9   GI: new bowel perforation s/p ex lap; was on TPN at [facility].  To OR today for small bowel anastomosis, colostomy, gastrostomy tube Nutrition:  prealbumin 2/2 11.5 reflects inflammation post bowel perf  Endo:  No insulin in TPN at [facility] per discussion with pharmacist. Insulin drip started 2/2. Marland Kitchen Do not think the cause of hyperglycemia is the TPN since he was on it at [facility].  Lytes: No electrolytes in current TPN,  K 3.4 and 2 K runs given,  phos coming down to 5.9 from 8.5, mag 1.8.   Renal: newly HD-dependent; on HD at [facility], only got 1 hr HD 2/2 due to tachycardia and decreased BP Hepatobil: LFTs ok; trig 291  care, PPI IV TPN Access: CVC   Plan:   1. Continue Clinimix 5/15 (no electrolytes) to 80 ml/hr + 20% lipids at 10 ml/hr.  This will provide 96 gm of protein and 1843 kcals.  This is 80% of protein and 87% kcal goals.  Goal rate of 100 m/hr would provide full support (120 gm protein and 2184 kcals)  but do not want to increase rate due to volume concerns today.  2. Continue electrolyte-free formula until  renal advised to change to formula with standard lytes. 3. No insulin in TPN, on insulin drip 4. K replaced by MD  Eudelia Bunch, Pharm.D. [phone] 08/20/2013 11:06 AM

## 2013-08-20 NOTE — Progress Notes (Signed)
PULMONARY  / CRITICAL CARE MEDICINE HISTORY AND PHYSICAL EXAMINATION  Name: Jason Harmon MRN: [id number] DOB: Mar 14, 1941    ADMISSION DATE:  08/18/2013  CHIEF COMPLAINT:  Bowel perforation  BRIEF PATIENT DESCRIPTION:  73 yo male with SBO/ischemic bowel s/p resection with subsequent complicated medical course including respiratory failure and inability to wean from ventilator. He is now admitted with bowel perforation at anastomosis site.   HISTORY OF PRESENT ILLNESS:  73 yo male with renal failure on dialysis and multiple recent surgeries for ischemic bowel presenting from [facility] with rigid abdomen and CT demonstrating bowel perforation. He has had a very complicated course over the past month when he initially presented to an OSH 1/9 with SBO/ischemic bowel requiring laparotomy and small bowel resection. His course was complicated by ARF, now on dialysis and respiratory failure resulting in tracheostomy and prolonged mechanical ventilation.  SIGNIFICANT EVENTS: 1/22 Tracheostomy 2/01 Transfer from [facility], to ER, to [facility] ICU.  CCS consulted 2/1 Exploratory laparotomy with segmental resection of small bowel anastomosis and sigmoid colon with placement of abdominal wound vac 2/02 Off pressors, did not tolerate HD  STUDIES:  CT A/P with contrast 08/17/2013 - bowel perforation, likely anastomotic breakdown at proximal enteroenterostomy with pneumoperitoneum and spilling of oral contrast, moderate ascites with peritonitis  LINES / TUBES: Tracheostomy 1/22>>> R IJ HD catheter 1/12>>>  CULTURES:  ANTIBIOTICS: Zosyn 2/1   SUBJECTIVE: Afebrile No obvious pain 'Poor UO  PHYSICAL EXAM VITAL SIGNS: Temp:  [98.1 F (36.7 C)-99.3 F (37.4 C)] 98.3 F (36.8 C) (02/03 0830) Pulse Rate:  [100-141] 105 (02/03 0830) Resp:  [13-48] 33 (02/03 0830) BP: (71-148)/(42-84) 148/76 mmHg (02/03 0830) SpO2:  [100 %] 100 % (02/03 0830) FiO2 (%):  [40 %] 40 % (02/03 0830) Weight:  [88.1 kg (194 lb  3.6 oz)] 88.1 kg (194 lb 3.6 oz) (02/03 0500)  VENTILATOR SETTINGS: Vent Mode:  [-] PCV FiO2 (%):  [40 %] 40 % Set Rate:  [30 bmp] 30 bmp PEEP:  [8 cmH20] 8 cmH20 Plateau Pressure:  [18 cmH20-21 cmH20] 18 cmH20  INTAKE / OUTPUT: Intake/Output     02/02 0701 - 02/03 0700 02/03 0701 - 02/04 0700   I.V. (mL/kg) 1642.6 (18.6) 53.2 (0.6)   Blood 162.5 162.5   NG/GT 20    IV Piggyback  100   TPN 2015 90   Total Intake(mL/kg) 3840.1 (43.6) 405.7 (4.6)   Urine (mL/kg/hr) 1015 (0.5) 125 (0.7)   Emesis/NG output 900 (0.4) 200 (1.1)   Drains 1400 (0.7) 300 (1.6)   Other -294 (-0.1)    Total Output 3021 625   Net +819.1 -219.3          PHYSICAL EXAMINATION: General: ill appearing Neuro: follows simple commands HEENT:  Trach site clean Cardiovascular:  Tachycardic, no murmurs/rubs/gallops Lungs:  Coarse breath sounds anteriorly Abdomen:  Distended, mild tenderness, wound vac Musculoskeletal:  2+ edema Skin:  No rash  LABS:  CBC Recent Labs     08/18/13  0404  08/19/13  0330  08/20/13  0430  WBC  61.2*  43.6*  34.2*  HGB  11.1*  7.9*  6.5*  HCT  32.2*  22.7*  18.6*  PLT  485*  329  271    Coag's Recent Labs     08/18/13  0404  APTT  30  INR  1.28    BMET Recent Labs     08/18/13  0404  08/19/13  0330  08/20/13  0430  NA  138  138  137  K  4.3  4.2  3.4*  CL  98  100  100  CO2  22  20  21   BUN  94*  115*  95*  CREATININE  2.90*  3.35*  2.95*  GLUCOSE  190*  352*  128*    Electrolytes Recent Labs     08/18/13  0404  08/19/13  0330  08/20/13  0430  CALCIUM  7.2*  6.6*  6.7*  MG   --   1.9  1.8  PHOS   --   8.5*  5.9*    Sepsis Markers Recent Labs     08/17/13  1125  PROCALCITON  0.95    ABG Recent Labs     08/18/13  1009  08/19/13  0455  PHART  7.328*  7.400  PCO2ART  43.6  32.4*  PO2ART  188.0*  135.0*    Liver Enzymes Recent Labs     08/17/13  1125  08/18/13  0404  08/19/13  0330  08/20/13  0430  AST  20  16  17    --   ALT   11  11  11    --   ALKPHOS  60  63  52   --   BILITOT  0.6  0.8  0.5   --   ALBUMIN  1.5*  1.5*  1.4*  1.5*   Glucose Recent Labs     08/19/13  2136  08/19/13  2240  08/19/13  2345  08/20/13  0046  08/20/13  0201  08/20/13  0307  GLUCAP  137*  123*  127*  135*  103*  114*    Imaging Dg Chest Port 1 View  08/20/2013   CLINICAL DATA:  Atelectasis  EXAM: PORTABLE CHEST - 1 VIEW  COMPARISON:  Portable exam [id number] hr compared to 08/19/2013  FINDINGS: Tips of lung apices excluded.  Nasogastric tube extends into stomach.  Right jugular central venous catheter tip projects over SVC near cavoatrial junction.  Tracheostomy tube tip projects above the carina.  Tip of left subclavian central venous catheter projects over mid SVC.  Stable heart size and mediastinal contours.  Increased bibasilar opacities since previous exam question infiltrate versus atelectasis.  No gross pleural effusion or pneumothorax.  IMPRESSION: Increased bibasilar opacities question infiltrate versus atelectasis.   Electronically Signed   By: Lavonia Dana M.D.   On: 08/20/2013 07:50   Dg Chest Port 1 View  08/19/2013   CLINICAL DATA:  Postop exploratory laparotomy for perforated viscus.  EXAM: PORTABLE CHEST - 1 VIEW  COMPARISON:  DG CHEST 1V PORT dated 08/17/2013; DG CHEST 1V PORT dated 08/14/2013  FINDINGS: 0556 hr. Tracheostomy appears well positioned. The nasogastric tube and bilateral central lines are unchanged. There is improved aeration of the lung bases with near-complete resolution of bibasilar pulmonary opacities. No pneumothorax or significant pleural effusion is identified. The heart size and mediastinal contours are stable.  IMPRESSION: Significant interval improvement in the aeration of the lungs. Stable support system.   Electronically Signed   By: Camie Patience M.D.   On: 08/19/2013 07:15    ASSESSMENT / PLAN:  PULMONARY A: Acute respiratory failure in setting of perforated bowel.  S/p Tracheostomy 1/22. P: -full  vent support, drop PEEP to 5, SBTs ina m -prn BD's  CARDIOVASCULAR A: Septic shock from perforated bowel >> off pressors 2/02. Hx of A fib >> previously on amiodarone. P: -monitor hemodynamics -dc IV fluids  RENAL A: Acute kidney injury 2nd  to ischemic bowel, shock >> started on HD January 2015. Ana sarca P: -HD per Renal  GASTROINTESTINAL A: Perforated bowel. Protein calorie malnutrition. P: -plan for OR 2/03 -continue TNA -protonix for SUP  HEMATOLOGIC A: Anemia of critical illness. P: -2 U PRBC -SCD for DVT prevention >> add SQ heparin after surgery  INFECTIOUS A: Septic shock from bowel perforation. P: -continue zosyn  ENDOCRINE A: Hyperglycemia. P: -SSI  NEUROLOGIC A: Acute encephalopathy 2nd to sepsis. P: -int fent while on vent  Updated family at bedside.  Explained long term prognosis depends on finding from [place] trip 2/03.  Care during the described time interval was provided by me and/or other providers on the critical care team.  I have reviewed this patient's available data, including medical history, events of note, physical examination and test results as part of my evaluation  CC time x 18m  Roxy Mastandrea V.   08/20/2013, 9:08 AM

## 2013-08-21 ENCOUNTER — Inpatient Hospital Stay (HOSPITAL_COMMUNITY): Payer: Medicare Other

## 2013-08-21 LAB — GLUCOSE, CAPILLARY
GLUCOSE-CAPILLARY: 119 mg/dL — AB (ref 70–99)
GLUCOSE-CAPILLARY: 120 mg/dL — AB (ref 70–99)
GLUCOSE-CAPILLARY: 151 mg/dL — AB (ref 70–99)
Glucose-Capillary: 130 mg/dL — ABNORMAL HIGH (ref 70–99)
Glucose-Capillary: 121 mg/dL — ABNORMAL HIGH (ref 70–99)
Glucose-Capillary: 129 mg/dL — ABNORMAL HIGH (ref 70–99)
Glucose-Capillary: 119 mg/dL — ABNORMAL HIGH (ref 70–99)
Glucose-Capillary: 122 mg/dL — ABNORMAL HIGH (ref 70–99)
Glucose-Capillary: 114 mg/dL — ABNORMAL HIGH (ref 70–99)
Glucose-Capillary: 112 mg/dL — ABNORMAL HIGH (ref 70–99)
Glucose-Capillary: 118 mg/dL — ABNORMAL HIGH (ref 70–99)
Glucose-Capillary: 140 mg/dL — ABNORMAL HIGH (ref 70–99)
Glucose-Capillary: 184 mg/dL — ABNORMAL HIGH (ref 70–99)

## 2013-08-21 LAB — POCT I-STAT 4, (NA,K, GLUC, HGB,HCT)
GLUCOSE: 132 mg/dL — AB (ref 70–99)
HEMATOCRIT: 24 % — AB (ref 39.0–52.0)
Hemoglobin: 8.2 g/dL — ABNORMAL LOW (ref 13.0–17.0)
Potassium: 3.3 mEq/L — ABNORMAL LOW (ref 3.7–5.3)
SODIUM: 138 meq/L (ref 137–147)

## 2013-08-21 LAB — MAGNESIUM: MAGNESIUM: 1.7 mg/dL (ref 1.5–2.5)

## 2013-08-21 LAB — BASIC METABOLIC PANEL
BUN: 100 mg/dL — AB (ref 6–23)
CALCIUM: 6.5 mg/dL — AB (ref 8.4–10.5)
CO2: 17 meq/L — AB (ref 19–32)
Chloride: 102 mEq/L (ref 96–112)
Creatinine, Ser: 2.98 mg/dL — ABNORMAL HIGH (ref 0.50–1.35)
GFR calc Af Amer: 23 mL/min — ABNORMAL LOW (ref >90)
GFR, EST NON AFRICAN AMERICAN: 20 mL/min — AB (ref >90)
GLUCOSE: 137 mg/dL — AB (ref 70–99)
Potassium: 3.6 mEq/L — ABNORMAL LOW (ref 3.7–5.3)
Sodium: 137 mEq/L (ref 137–147)

## 2013-08-21 LAB — CBC
HCT: 23.2 % — ABNORMAL LOW (ref 39.0–52.0)
Hemoglobin: 8.3 g/dL — ABNORMAL LOW (ref 13.0–17.0)
MCH: 31.2 pg (ref 26.0–34.0)
MCHC: 35.8 g/dL (ref 30.0–36.0)
MCV: 87.2 fL (ref 78.0–100.0)
PLATELETS: 228 10*3/uL (ref 150–400)
RBC: 2.66 MIL/uL — ABNORMAL LOW (ref 4.22–5.81)
RDW: 14.9 % (ref 11.5–15.5)
WBC: 40.2 10*3/uL — ABNORMAL HIGH (ref 4.0–10.5)

## 2013-08-21 LAB — PHOSPHORUS: Phosphorus: 5.6 mg/dL — ABNORMAL HIGH (ref 2.3–4.6)

## 2013-08-21 MED ORDER — INSULIN ASPART 100 UNIT/ML ~~LOC~~ SOLN
2.0000 [IU] | SUBCUTANEOUS | Status: DC
  Administered 2013-08-21: 4 [IU] via SUBCUTANEOUS
  Administered 2013-08-21 – 2013-08-22 (×3): 2 [IU] via SUBCUTANEOUS
  Administered 2013-08-22 (×2): 4 [IU] via SUBCUTANEOUS
  Administered 2013-08-23 – 2013-08-25 (×7): 2 [IU] via SUBCUTANEOUS
  Administered 2013-08-26: 4 [IU] via SUBCUTANEOUS
  Administered 2013-08-26 – 2013-08-27 (×2): 2 [IU] via SUBCUTANEOUS

## 2013-08-21 MED ORDER — DEXTROSE 10 % IV SOLN: INTRAVENOUS | Status: DC | PRN

## 2013-08-21 MED ORDER — INSULIN GLARGINE 100 UNIT/ML ~~LOC~~ SOLN
20.0000 [IU] | Freq: Two times a day (BID) | SUBCUTANEOUS | Status: DC
  Administered 2013-08-21 – 2013-08-26 (×12): 20 [IU] via SUBCUTANEOUS
  Filled 2013-08-21 (×13): qty 0.2

## 2013-08-21 MED ORDER — FAT EMULSION 20 % IV EMUL
250.0000 mL | INTRAVENOUS | Status: AC
  Administered 2013-08-21: 250 mL via INTRAVENOUS
  Filled 2013-08-21: qty 250

## 2013-08-21 MED ORDER — TRACE MINERALS CR-CU-F-FE-I-MN-MO-SE-ZN IV SOLN
INTRAVENOUS | Status: AC
  Administered 2013-08-21: 18:00:00 via INTRAVENOUS
  Filled 2013-08-21: qty 2000

## 2013-08-21 NOTE — Progress Notes (Signed)
RN called d/t pt w/ increased HR.  PT placed back on vent rest mode.  Pt tol change well.

## 2013-08-21 NOTE — Progress Notes (Signed)
Admit: 08/18/2013 LOS: 3  35M with dialysis dependent AKI, progressint to ESRD with chronic ventilatory failure, small bowel performation, abdominal septic shock.    Subjective:  To OR yesterday Off pressors BP stable Good UOP SCR and BUN Stable in past 24h  02/03 0701 - 02/04 0700 In: 4819.8 [I.V.:1647.3; Blood:162.5; IV Piggyback:850; TPN:2160] Out: 2445 [QIONG:2952; Emesis/NG output:200; Drains:625]  Filed Weights   08/19/13 1400 08/20/13 0500 08/21/13 0500  Weight: 88.1 kg (194 lb 3.6 oz) 88.1 kg (194 lb 3.6 oz) 87.7 kg (193 lb 5.5 oz)    Current meds: reviewed Current Labs: reviewed   Physical Exam:  Blood pressure 187/80, pulse 121, temperature 99.5 F (37.5 C), temperature source Axillary, resp. rate 35, height 5\' 11"  (1.803 m), weight 87.7 kg (193 lb 5.5 oz), SpO2 100.00%. GEN: chronically ill appaering, Trach, sedated  ENT: Trach and R IJ nontunneled HD Catheter noted  EYES: EOMI. Eyes open to noise, stimullus  CV: tachy, regular  PULM: +Bs b/l  ABD: wound vac at midline  SKIN: large ventral abd incision  EXT: 2+ pitting edema b/l  Assessment/Plan 1. Dialysis Dependent AKI, progressing to ESRD: Has been on iHD tentative MWF schedule.  Did not tolerate Mon treatment 2/2 hemodynamics.  Now that making urine and stable GFR, will watch carefully.  If needs HD, agree taht CRRT might be best choice, but hopeful he remains stable and can then consider diuretics to assist with weaning.   2. Anemia: Stable in pastt 24h.  Per surgery and CCM.  If stabilizes can consider using ESA 3. Septic shock and SB performation: per surgery and CCM  Pearson Grippe MD 08/21/2013, 9:34 AM   Recent Labs Lab 08/19/13 0330 08/20/13 0430 08/20/13 1057 08/20/13 1230 08/21/13 0355  NA 138 137 138 137 137  K 4.2 3.4* 3.3* 3.7 3.6*  CL 100 100  --  101 102  CO2 20 21  --  18* 17*  GLUCOSE 352* 128* 132* 136* 137*  BUN 115* 95*  --  95* 100*  CREATININE 3.35* 2.95*  --  2.87* 2.98*   CALCIUM 6.6* 6.7*  --  6.5* 6.5*  PHOS 8.5* 5.9*  --   --  5.6*    Recent Labs Lab 08/18/13 0404 08/19/13 0330 08/20/13 0430 08/20/13 1057 08/21/13 0355  WBC 61.2* 43.6* 34.2*  --  40.2*  NEUTROABS 58.8*  --   --   --   --   HGB 11.1* 7.9* 6.5* 8.2* 8.3*  HCT 32.2* 22.7* 18.6* 24.0* 23.2*  MCV 88.2 88.0 87.7  --  87.2  PLT 485* 329 271  --  228

## 2013-08-21 NOTE — Progress Notes (Addendum)
PARENTERAL NUTRITION CONSULT NOTE - FOLLOW UP  Pharmacy Consult for TPN Indication: perforated viscous  No Known Allergies  Patient Measurements: Height: 5\' 11"  (180.3 cm) (5'11") Weight: 193 lb 5.5 oz (87.7 kg) IBW/kg (Calculated) : 75.3 Weight at [facility] 89.3 kg  Vital Signs: Temp: 99.5 F (37.5 C) (02/04 0721) Temp src: Axillary (02/04 0721) BP: 187/80 mmHg (02/04 0818) Pulse Rate: 121 (02/04 0818) Intake/Output from previous day: 02/03 0701 - 02/04 0700 In: 4819.8 [I.V.:1647.3; Blood:162.5; IV Piggyback:850; TPN:2160] Out: 2445 [IZTIW:5809; Emesis/NG output:200; Drains:625] Intake/Output from this shift:    Labs:  Recent Labs  08/19/13 0330 08/20/13 0430 08/20/13 1057 08/21/13 0355  WBC 43.6* 34.2*  --  40.2*  HGB 7.9* 6.5* 8.2* 8.3*  HCT 22.7* 18.6* 24.0* 23.2*  PLT 329 271  --  228     Recent Labs  08/19/13 0330 08/20/13 0430 08/20/13 1057 08/20/13 1230 08/21/13 0355  NA 138 137 138 137 137  K 4.2 3.4* 3.3* 3.7 3.6*  CL 100 100  --  101 102  CO2 20 21  --  18* 17*  GLUCOSE 352* 128* 132* 136* 137*  BUN 115* 95*  --  95* 100*  CREATININE 3.35* 2.95*  --  2.87* 2.98*  CALCIUM 6.6* 6.7*  --  6.5* 6.5*  MG 1.9 1.8  --   --  1.7  PHOS 8.5* 5.9*  --   --  5.6*  PROT 3.7*  --   --   --   --   ALBUMIN 1.4* 1.5*  --   --   --   AST 17  --   --   --   --   ALT 11  --   --   --   --   ALKPHOS 52  --   --   --   --   BILITOT 0.5  --   --   --   --   PREALBUMIN 11.5*  --   --   --   --   TRIG 291*  --   --   --   --    Estimated Creatinine Clearance: 24.6 ml/min (by C-G formula based on Cr of 2.98).    Recent Labs  08/21/13 0158 08/21/13 0351 08/21/13 0558  GLUCAP 121* 129* 119*    Medications:  Infusions:  . dextrose    . TPN (CLINIMIX) Adult without lytes 80 mL/hr at 08/21/13 0400   And  . fat emulsion 500 kcal (08/21/13 0400)  . insulin (NOVOLIN-R) infusion 3.7 Units/hr (08/21/13 0700)  . vasopressin (PITRESSIN) infusion - *FOR SHOCK*  Stopped (08/19/13 0500)    Insulin Requirements in the past 24 hours:  Started on insulin drip 2/2.  To transition to lantus 20 units BID today per ICU hyperglycemia protocol  Current Nutrition:  At [facility], was on Clinimix E 5/20 at 65 ml/hr. 2/1 changed to Clinimix 5/15 at 75 ml/hr + lipids at 10 mlg/hr 2/4 Clinimix 5/15 at 80 ml/hr + lipids at 10 ml/hr  Nutritional Goals: per RD note 2/2 2130 kCal, 120-135 grams of protein per day  Admit: 73 year old male with SBO and ischemic bowel s/p resection at [facility].  Was transferred to [facility] for continuing hemodialysis and inability to wean from ventilator.  On Sunday 2/1 developed a new bowel perforation at his anastomosis site and is s/p ex lap with small bowel resection and wound vac placement. 2/2 did not tolerate HD, got 1 hr 2/3: OR small bowel anastomosis, colostomy,  gastrostomy tube, blood transfusion pre op for Hg 5.9 2/4: POD 3/1  reexploration with small bowel anastmosis, colostomy g tube, intubated; no HD planned for today  GI: new bowel perforation s/p ex lap; was on TPN at [facility].  To OR 2/3  for small bowel anastomosis, colostomy, gastrostomy tube. Per CCS " He is high risk still for abdominal issues, continue abx for peritonitis, g tube to drainage, hold on enteral feeds until small bowel healed and has bowel function"  Nutrition: prealbumin 2/2 11.5 reflects inflammation post bowel perf , TPN not at goal rate due to volume restrictions Endo:  No insulin in TPN at [facility] per discussion with [facility] pharmacist on 2/1. Insulin drip started 2/2. . I previously did not think the cause of hyperglycemia was the TPN since he was on TPN with no insulin at [facility].  However, looking back at the CBGs while at [facility] they were elevated. To be transitioned to lantus 20 units BID per ICU hyperglycemia protocol today and DC insulin drip.  Will continue TPN with no insulin. Lytes: No electrolytes in current TPN,  K 3.6 after 2 K runs given yesterday,   phos coming down to 5.6, mag 1.7- most likely will need a mag bolus tomorrow.  Renal: newly HD-dependent; on HD at [facility], only got 1 hr HD 2/2 due to tachycardia and decreased BP. Plan is no HD today - fairly stable and concern for dropping BP with HD.  Hepatobil: LFTs ok; trig 291  care, PPI IV TPN Access: CVC   Plan:   1. Continue Clinimix 5/15 (no electrolytes) at 80 ml/hr + 20% lipids at 10 ml/hr.  This will provide 96 gm of protein and 1843 kcals.  This is 80% of protein and 87% kcal goals.  Goal rate of 100 m/hr would provide full support (120 gm protein and 2184 kcals)  but do not want to increase rate due to volume concerns.  2. Continue electrolyte-free formula until renal advised to change to formula with standard lytes. 3. No insulin in TPN, on insulin drip, to transition to lantus 20 units BID today 4. TPN labs in am Eudelia Bunch, Pharm.D. [phone] 08/21/2013 9:25 AM

## 2013-08-21 NOTE — Progress Notes (Addendum)
PULMONARY  / CRITICAL CARE MEDICINE HISTORY AND PHYSICAL EXAMINATION  Name: Jason Harmon MRN: [id number] DOB: 1941/05/20    ADMISSION DATE:  08/18/2013  CHIEF COMPLAINT:  Bowel perforation  BRIEF PATIENT DESCRIPTION:  73 yo male with SBO/ischemic bowel s/p resection with subsequent complicated medical course including respiratory failure and inability to wean from ventilator. He is now admitted with bowel perforation at anastomosis site.   HISTORY OF PRESENT ILLNESS:  73 yo male with renal failure on dialysis and multiple recent surgeries for ischemic bowel presenting from [facility] with rigid abdomen and CT demonstrating bowel perforation. He has had a very complicated course over the past month when he initially presented to an OSH 1/9 with SBO/ischemic bowel requiring laparotomy and small bowel resection. His course was complicated by ARF, now on dialysis and respiratory failure resulting in tracheostomy and prolonged mechanical ventilation.  SIGNIFICANT EVENTS: 1/22 Tracheostomy 2/01 Transfer from [facility], to ER, to [facility] ICU.  CCS consulted 2/1 Exploratory laparotomy with segmental resection of small bowel anastomosis and sigmoid colon with placement of abdominal wound vac 2/02 Off pressors, did not tolerate HD 2/4 small bowel anastomosis with gastrostomy & End sigmoid colostomy   STUDIES:  CT A/P with contrast 08/17/2013 - bowel perforation, likely anastomotic breakdown at proximal enteroenterostomy with pneumoperitoneum and spilling of oral contrast, moderate ascites with peritonitis  LINES / TUBES: Tracheostomy 1/22>>> R IJ HD catheter 1/12>>>  CULTURES:  ANTIBIOTICS: Zosyn 2/1   SUBJECTIVE: Afebrile No obvious pain   PHYSICAL EXAM VITAL SIGNS: Temp:  [97.4 F (36.3 C)-99.5 F (37.5 C)] 99.5 F (37.5 C) (02/04 0721) Pulse Rate:  [100-121] 121 (02/04 0818) Resp:  [25-38] 35 (02/04 0818) BP: (115-187)/(65-87) 187/80 mmHg (02/04 0818) SpO2:  [100 %] 100 % (02/04  0818) Arterial Line BP: (161-192)/(63-103) 161/63 mmHg (02/04 0700) FiO2 (%):  [40 %] 40 % (02/04 0818) Weight:  [87.7 kg (193 lb 5.5 oz)] 87.7 kg (193 lb 5.5 oz) (02/04 0500)  VENTILATOR SETTINGS: Vent Mode:  [-] PCV FiO2 (%):  [40 %] 40 % Set Rate:  [20 bmp-30 bmp] 20 bmp PEEP:  [5 cmH20] 5 cmH20 Plateau Pressure:  [16 cmH20-21 cmH20] 19 cmH20  INTAKE / OUTPUT: Intake/Output     02/03 0701 - 02/04 0700 02/04 0701 - 02/05 0700   I.V. (mL/kg) 1647.3 (18.8)    Blood 162.5    NG/GT     IV Piggyback 850    TPN 2160    Total Intake(mL/kg) 4819.8 (55)    Urine (mL/kg/hr) 1620 (0.8)    Emesis/NG output 200 (0.1)    Drains 625 (0.3)    Other     Total Output 2445     Net +2374.8            PHYSICAL EXAMINATION: General: ill appearing Neuro: does not follow simple commands HEENT:  Trach site clean Cardiovascular:  Tachycardic, no murmurs/rubs/gallops Lungs:  Coarse breath sounds anteriorly Abdomen:  Distended, mild tenderness, wound vac Musculoskeletal:  2+ edema Skin:  No rash  LABS:  CBC Recent Labs     08/19/13  0330  08/20/13  0430  08/20/13  1057  08/21/13  0355  WBC  43.6*  34.2*   --   40.2*  HGB  7.9*  6.5*  8.2*  8.3*  HCT  22.7*  18.6*  24.0*  23.2*  PLT  329  271   --   228    Coag's No results found for this basename: APTT, INR,  in the  last 72 hours  BMET Recent Labs     08/20/13  0430  08/20/13  1057  08/20/13  1230  08/21/13  0355  NA  137  138  137  137  K  3.4*  3.3*  3.7  3.6*  CL  100   --   101  102  CO2  21   --   18*  17*  BUN  95*   --   95*  100*  CREATININE  2.95*   --   2.87*  2.98*  GLUCOSE  128*  132*  136*  137*    Electrolytes Recent Labs     08/19/13  0330  08/20/13  0430  08/20/13  1230  08/21/13  0355  CALCIUM  6.6*  6.7*  6.5*  6.5*  MG  1.9  1.8   --   1.7  PHOS  8.5*  5.9*   --   5.6*    Sepsis Markers No results found for this basename: LACTICACIDVEN, PROCALCITON, O2SATVEN,  in the last 72  hours  ABG Recent Labs     08/18/13  1009  08/19/13  0455  PHART  7.328*  7.400  PCO2ART  43.6  32.4*  PO2ART  188.0*  135.0*    Liver Enzymes Recent Labs     08/19/13  0330  08/20/13  0430  AST  17   --   ALT  11   --   ALKPHOS  52   --   BILITOT  0.5   --   ALBUMIN  1.4*  1.5*   Glucose Recent Labs     08/20/13  2101  08/20/13  2205  08/21/13  0106  08/21/13  0158  08/21/13  0351  08/21/13  0558  GLUCAP  109*  115*  130*  121*  129*  119*    Imaging Dg Chest Port 1 View  08/21/2013   CLINICAL DATA:  Postop and partial small bowel resection.  EXAM: PORTABLE CHEST - 1 VIEW  COMPARISON:  DG CHEST 1V PORT dated 08/20/2013  FINDINGS: Temporary dialysis catheter in the right jugular vein and the tip is in the lower SVC. There is a left jugular central line with the tip in the SVC. Hazy densities at the left lung base suggests small effusion and atelectasis. No evidence for a pneumothorax. Heart size is within normal limits. Tracheostomy tube is present. Nasogastric tube has been removed.  IMPRESSION: Densities at the left lung base suggest atelectasis and possibly pleural effusion.  Support apparatuses as described.   Electronically Signed   By: Markus Daft M.D.   On: 08/21/2013 07:18   Dg Chest Port 1 View  08/20/2013   CLINICAL DATA:  Atelectasis  EXAM: PORTABLE CHEST - 1 VIEW  COMPARISON:  Portable exam 5009 hr compared to 08/19/2013  FINDINGS: Tips of lung apices excluded.  Nasogastric tube extends into stomach.  Right jugular central venous catheter tip projects over SVC near cavoatrial junction.  Tracheostomy tube tip projects above the carina.  Tip of left subclavian central venous catheter projects over mid SVC.  Stable heart size and mediastinal contours.  Increased bibasilar opacities since previous exam question infiltrate versus atelectasis.  No gross pleural effusion or pneumothorax.  IMPRESSION: Increased bibasilar opacities question infiltrate versus atelectasis.    Electronically Signed   By: Lavonia Dana M.D.   On: 08/20/2013 07:50    ASSESSMENT / PLAN:  PULMONARY A: Acute respiratory failure in setting of perforated  bowel.  S/p Tracheostomy 1/22. P: - drop RR to 20,  SBTs as tolerated -neg balance -prn BD's  CARDIOVASCULAR A: Septic shock from perforated bowel >> off pressors 2/02. Hx of A fib >> previously on amiodarone. P: -monitor hemodynamics -dc IV fluids  RENAL A: Acute kidney injury 2nd to ischemic bowel, shock >> started on HD January 2015. Ana sarca P: -HD per Renal, needs neg balance to enable wean, more important to avoid hypotension -? Consider CVVH here  GASTROINTESTINAL A: Perforated bowel. Protein calorie malnutrition. P: -continue TNA -protonix for SUP  HEMATOLOGIC A: Anemia of critical illness. P: -2 U PRBC -SCD for DVT prevention >> add SQ heparin when able -per surgery  INFECTIOUS A: Septic shock from bowel perforation. P: -continue zosyn  ENDOCRINE A: Hyperglycemia. P: -SSI  NEUROLOGIC A: Acute encephalopathy 2nd to sepsis. P: -int fent while on vent  Updated family at bedside.  Explained long term prognosis guarded  Care during the described time interval was provided by me and/or other providers on the critical care team.  I have reviewed this patient's available data, including medical history, events of note, physical examination and test results as part of my evaluation  CC time x 27m  Jyaire Koudelka V.   08/21/2013, 8:59 AM

## 2013-08-21 NOTE — Progress Notes (Signed)
1 Day Post-Op  Subjective: Intubated, moving  Objective: Vital signs in last 24 hours: Temp:  [97.4 F (36.3 C)-99.5 F (37.5 C)] 99.5 F (37.5 C) (02/04 0721) Pulse Rate:  [100-121] 121 (02/04 0818) Resp:  [25-38] 35 (02/04 0818) BP: (115-187)/(65-87) 187/80 mmHg (02/04 0818) SpO2:  [100 %] 100 % (02/04 0818) Arterial Line BP: (161-201)/(63-103) 201/96 mmHg (02/04 0800) FiO2 (%):  [40 %] 40 % (02/04 0818) Weight:  [193 lb 5.5 oz (87.7 kg)] 193 lb 5.5 oz (87.7 kg) (02/04 0500)    Intake/Output from previous day: 02/03 0701 - 02/04 0700 In: 4819.8 [I.V.:1647.3; Blood:162.5; IV Piggyback:850; TPN:2160] Out: 1601 [UXNAT:5573; Emesis/NG output:200; Drains:625] Intake/Output this shift:    General appearance: no distress GI: approp tender, dressing dry, g tube draining, ostomy with some edema  Lab Results:   Recent Labs  08/20/13 0430 08/20/13 1057 08/21/13 0355  WBC 34.2*  --  40.2*  HGB 6.5* 8.2* 8.3*  HCT 18.6* 24.0* 23.2*  PLT 271  --  228   BMET  Recent Labs  08/20/13 1230 08/21/13 0355  NA 137 137  K 3.7 3.6*  CL 101 102  CO2 18* 17*  GLUCOSE 136* 137*  BUN 95* 100*  CREATININE 2.87* 2.98*  CALCIUM 6.5* 6.5*   PT/INR No results found for this basename: LABPROT, INR,  in the last 72 hours ABG  Recent Labs  08/18/13 1009 08/19/13 0455  PHART 7.328* 7.400  HCO3 23.0 19.7*    Studies/Results: Dg Chest Port 1 View  08/21/2013   CLINICAL DATA:  Postop and partial small bowel resection.  EXAM: PORTABLE CHEST - 1 VIEW  COMPARISON:  DG CHEST 1V PORT dated 08/20/2013  FINDINGS: Temporary dialysis catheter in the right jugular vein and the tip is in the lower SVC. There is a left jugular central line with the tip in the SVC. Hazy densities at the left lung base suggests small effusion and atelectasis. No evidence for a pneumothorax. Heart size is within normal limits. Tracheostomy tube is present. Nasogastric tube has been removed.  IMPRESSION: Densities at  the left lung base suggest atelectasis and possibly pleural effusion.  Support apparatuses as described.   Electronically Signed   By: Markus Daft M.D.   On: 08/21/2013 07:18   Dg Chest Port 1 View  08/20/2013   CLINICAL DATA:  Atelectasis  EXAM: PORTABLE CHEST - 1 VIEW  COMPARISON:  Portable exam 2202 hr compared to 08/19/2013  FINDINGS: Tips of lung apices excluded.  Nasogastric tube extends into stomach.  Right jugular central venous catheter tip projects over SVC near cavoatrial junction.  Tracheostomy tube tip projects above the carina.  Tip of left subclavian central venous catheter projects over mid SVC.  Stable heart size and mediastinal contours.  Increased bibasilar opacities since previous exam question infiltrate versus atelectasis.  No gross pleural effusion or pneumothorax.  IMPRESSION: Increased bibasilar opacities question infiltrate versus atelectasis.   Electronically Signed   By: Lavonia Dana M.D.   On: 08/20/2013 07:50    Anti-infectives: Anti-infectives   Start     Dose/Rate Route Frequency Ordered Stop   08/18/13 1400  piperacillin-tazobactam (ZOSYN) IVPB 2.25 g     2.25 g 100 mL/hr over 30 Minutes Intravenous 3 times per day 08/18/13 1124     08/18/13 0500  piperacillin-tazobactam (ZOSYN) IVPB 3.375 g  Status:  Discontinued     3.375 g 12.5 mL/hr over 240 Minutes Intravenous 3 times per day 08/18/13 0434 08/18/13 1124  Assessment/Plan: Pod 3/1 reexploration with small bowel anastmosis, colostomy g tube  He is high risk still for abdominal issues, continue abx for peritonitis, g tube to drainage, hold on enteral feeds until small bowel healed and has bowel function Appreciate ccm and renal assistance  [facility] 08/21/2013

## 2013-08-22 ENCOUNTER — Inpatient Hospital Stay (HOSPITAL_COMMUNITY): Payer: Medicare Other

## 2013-08-22 DIAGNOSIS — D62 Acute posthemorrhagic anemia: Secondary | ICD-10-CM

## 2013-08-22 LAB — GLUCOSE, CAPILLARY
GLUCOSE-CAPILLARY: 152 mg/dL — AB (ref 70–99)
GLUCOSE-CAPILLARY: 142 mg/dL — AB (ref 70–99)
GLUCOSE-CAPILLARY: 105 mg/dL — AB (ref 70–99)
Glucose-Capillary: 115 mg/dL — ABNORMAL HIGH (ref 70–99)
Glucose-Capillary: 124 mg/dL — ABNORMAL HIGH (ref 70–99)
Glucose-Capillary: 125 mg/dL — ABNORMAL HIGH (ref 70–99)
Glucose-Capillary: 60 mg/dL — ABNORMAL LOW (ref 70–99)

## 2013-08-22 LAB — COMPREHENSIVE METABOLIC PANEL
ALK PHOS: 71 U/L (ref 39–117)
ALT: 13 U/L (ref 0–53)
AST: 20 U/L (ref 0–37)
Albumin: 1.3 g/dL — ABNORMAL LOW (ref 3.5–5.2)
BILIRUBIN TOTAL: 1 mg/dL (ref 0.3–1.2)
BUN: 107 mg/dL — AB (ref 6–23)
CHLORIDE: 103 meq/L (ref 96–112)
CO2: 16 meq/L — AB (ref 19–32)
CREATININE: 3.24 mg/dL — AB (ref 0.50–1.35)
Calcium: 6.4 mg/dL — CL (ref 8.4–10.5)
GFR, EST AFRICAN AMERICAN: 21 mL/min — AB (ref >90)
GFR, EST NON AFRICAN AMERICAN: 18 mL/min — AB (ref >90)
GLUCOSE: 157 mg/dL — AB (ref 70–99)
POTASSIUM: 3.3 meq/L — AB (ref 3.7–5.3)
Sodium: 138 mEq/L (ref 137–147)
Total Protein: 4.1 g/dL — ABNORMAL LOW (ref 6.0–8.3)

## 2013-08-22 LAB — TYPE AND SCREEN
ABO/RH(D): A POS
Antibody Screen: NEGATIVE
UNIT DIVISION: 0
Unit division: 0
Unit division: 0

## 2013-08-22 LAB — CBC
HEMATOCRIT: 18.4 % — AB (ref 39.0–52.0)
HEMOGLOBIN: 6.4 g/dL — AB (ref 13.0–17.0)
MCH: 30.5 pg (ref 26.0–34.0)
MCHC: 34.8 g/dL (ref 30.0–36.0)
MCV: 87.6 fL (ref 78.0–100.0)
Platelets: 205 10*3/uL (ref 150–400)
RBC: 2.1 MIL/uL — AB (ref 4.22–5.81)
RDW: 14.6 % (ref 11.5–15.5)
WBC: 29.9 10*3/uL — AB (ref 4.0–10.5)

## 2013-08-22 LAB — PREPARE RBC (CROSSMATCH)

## 2013-08-22 LAB — HEMOGLOBIN AND HEMATOCRIT, BLOOD
HCT: 21.6 % — ABNORMAL LOW (ref 39.0–52.0)
HEMOGLOBIN: 7.6 g/dL — AB (ref 13.0–17.0)

## 2013-08-22 LAB — PROTIME-INR
INR: 1.36 (ref 0.00–1.49)
Prothrombin Time: 16.4 seconds — ABNORMAL HIGH (ref 11.6–15.2)

## 2013-08-22 LAB — PHOSPHORUS: PHOSPHORUS: 5.2 mg/dL — AB (ref 2.3–4.6)

## 2013-08-22 LAB — MAGNESIUM: MAGNESIUM: 1.8 mg/dL (ref 1.5–2.5)

## 2013-08-22 MED ORDER — POTASSIUM CHLORIDE 10 MEQ/50ML IV SOLN
10.0000 meq | INTRAVENOUS | Status: AC
  Administered 2013-08-22 (×3): 10 meq via INTRAVENOUS
  Filled 2013-08-22: qty 50

## 2013-08-22 MED ORDER — TRACE MINERALS CR-CU-F-FE-I-MN-MO-SE-ZN IV SOLN
INTRAVENOUS | Status: AC
  Administered 2013-08-22: 18:00:00 via INTRAVENOUS
  Filled 2013-08-22: qty 2000

## 2013-08-22 MED ORDER — PANTOPRAZOLE SODIUM 40 MG IV SOLR
40.0000 mg | Freq: Two times a day (BID) | INTRAVENOUS | Status: DC
  Administered 2013-08-22 – 2013-09-02 (×23): 40 mg via INTRAVENOUS
  Filled 2013-08-22 (×26): qty 40

## 2013-08-22 MED ORDER — FAT EMULSION 20 % IV EMUL
250.0000 mL | INTRAVENOUS | Status: AC
  Administered 2013-08-22: 250 mL via INTRAVENOUS
  Filled 2013-08-22: qty 250

## 2013-08-22 NOTE — Progress Notes (Signed)
Per RN, pt is bleeding and has low hgb.  RN reqest to hold off weaning vent until MD rounds.

## 2013-08-22 NOTE — Progress Notes (Signed)
[facility] Progress Note Patient Name: Jason Harmon DOB: 10/22/1940 MRN: [id number]  Date of Service  08/22/2013   HPI/Events of Note  Hgb 8.3 2/4 now 6.4.   eICU Interventions  Plan: Transfuse 1 unit of pRBC Post-transfusion CBC   Intervention Category Intermediate Interventions: Bleeding - evaluation and treatment with blood products  Jason Harmon 08/22/2013, 5:46 AM

## 2013-08-22 NOTE — Progress Notes (Signed)
PULMONARY  / CRITICAL CARE MEDICINE HISTORY AND PHYSICAL EXAMINATION  Name: Jason Harmon MRN: [id number] DOB: 1941-07-11    ADMISSION DATE:  08/18/2013  CHIEF COMPLAINT:  Bowel perforation  BRIEF PATIENT DESCRIPTION:  73 yo male with SBO/ischemic bowel s/p resection with subsequent complicated medical course including respiratory failure and inability to wean from ventilator. He is now admitted with bowel perforation at anastomosis site.   HISTORY OF PRESENT ILLNESS:  74 yo male with renal failure on dialysis and multiple recent surgeries for ischemic bowel presenting from [facility] with rigid abdomen and CT demonstrating bowel perforation. He has had a very complicated course over the past month when he initially presented to an OSH 1/9 with SBO/ischemic bowel requiring laparotomy and small bowel resection. His course was complicated by ARF, now on dialysis and respiratory failure resulting in tracheostomy and prolonged mechanical ventilation.  SIGNIFICANT EVENTS: 1/22 Tracheostomy 2/01 Transfer from [facility], to ER, to [facility] ICU.  CCS consulted 2/1 Exploratory laparotomy with segmental resection of small bowel anastomosis and sigmoid colon with placement of abdominal wound vac 2/02 Off pressors, did not tolerate HD 2/4 small bowel anastomosis with gastrostomy & End sigmoid colostomy   STUDIES:  CT A/P with contrast 08/17/2013 - bowel perforation, likely anastomotic breakdown at proximal enteroenterostomy with pneumoperitoneum and spilling of oral contrast, moderate ascites with peritonitis  LINES / TUBES: Tracheostomy 1/22>>> R IJ HD catheter 1/12>>>  CULTURES:  ANTIBIOTICS: Zosyn 2/1   SUBJECTIVE: Afebrile No obvious pain Good UO   PHYSICAL EXAM VITAL SIGNS: Temp:  [98 F (36.7 C)-99.1 F (37.3 C)] 98.2 F (36.8 C) (02/05 0342) Pulse Rate:  [102-123] 109 (02/05 0800) Resp:  [22-36] 31 (02/05 0725) BP: (119-188)/(62-97) 146/79 mmHg (02/05 0800) SpO2:  [100 %] 100 % (02/05  0800) Arterial Line BP: (157-207)/(59-128) 195/71 mmHg (02/05 0800) FiO2 (%):  [40 %] 40 % (02/05 0800) Weight:  [87.9 kg (193 lb 12.6 oz)] 87.9 kg (193 lb 12.6 oz) (02/05 0500)  VENTILATOR SETTINGS: Vent Mode:  [-] PCV FiO2 (%):  [40 %] 40 % Set Rate:  [20 bmp] 20 bmp PEEP:  [5 cmH20] 5 cmH20 Pressure Support:  [12 cmH20] 12 cmH20 Plateau Pressure:  [17 cmH20-18 cmH20] 17 cmH20  INTAKE / OUTPUT: Intake/Output     02/04 0701 - 02/05 0700 02/05 0701 - 02/06 0700   I.V. (mL/kg) 950.8 (10.8) 40 (0.5)   Blood     IV Piggyback 150    TPN 2160 90   Total Intake(mL/kg) 3260.8 (37.1) 130 (1.5)   Urine (mL/kg/hr) 1835 (0.9) 235 (1.1)   Emesis/NG output     Drains 575 (0.3)    Stool 240 (0.1)    Total Output 2650 235   Net +610.8 -105          PHYSICAL EXAMINATION: General: ill appearing, awake Neuro: does not follow simple commands HEENT:  Trach site clean Cardiovascular:  Tachycardic, no murmurs/rubs/gallops Lungs:  Coarse breath sounds anteriorly Abdomen:  Distended, mild tenderness, wound vac Musculoskeletal:  2+ edema Skin:  No rash  LABS:  CBC Recent Labs     08/20/13  0430  08/20/13  1057  08/21/13  0355  08/22/13  0400  WBC  34.2*   --   40.2*  29.9*  HGB  6.5*  8.2*  8.3*  6.4*  HCT  18.6*  24.0*  23.2*  18.4*  PLT  271   --   228  205    Coag's No results found for this basename:  APTT, INR,  in the last 72 hours  BMET Recent Labs     08/20/13  1230  08/21/13  0355  08/22/13  0400  NA  137  137  138  K  3.7  3.6*  3.3*  CL  101  102  103  CO2  18*  17*  16*  BUN  95*  100*  107*  CREATININE  2.87*  2.98*  3.24*  GLUCOSE  136*  137*  157*    Electrolytes Recent Labs     08/20/13  0430  08/20/13  1230  08/21/13  0355  08/22/13  0400  CALCIUM  6.7*  6.5*  6.5*  6.4*  MG  1.8   --   1.7  1.8  PHOS  5.9*   --   5.6*  5.2*    Sepsis Markers No results found for this basename: LACTICACIDVEN, PROCALCITON, O2SATVEN,  in the last 72  hours  ABG No results found for this basename: PHART, PCO2ART, PO2ART,  in the last 72 hours  Liver Enzymes Recent Labs     08/20/13  0430  08/22/13  0400  AST   --   20  ALT   --   13  ALKPHOS   --   71  BILITOT   --   1.0  ALBUMIN  1.5*  1.3*   Glucose Recent Labs     08/21/13  1234  08/21/13  1338  08/21/13  1548  08/21/13  1919  08/21/13  2325  08/22/13  0343  GLUCAP  112*  118*  140*  184*  151*  152*    Imaging Dg Chest Port 1 View  08/22/2013   CLINICAL DATA:  Pleural effusions.  EXAM: PORTABLE CHEST - 1 VIEW  COMPARISON:  DG CHEST 1V PORT dated 08/21/2013  FINDINGS: Tracheostomy tube and bilateral IJ central lines are in stable position. Left lower lobe mild atelectasis and/or infiltrate. Small left pleural effusion cannot be excluded. Heart size normal. Pulmonary vascularity normal. Degenerative changes thoracic spine .  IMPRESSION: 1. Stable tracheostomy tube and central line positions. 2. Left lower lobe atelectasis and/or infiltrate with small left pleural effusion, unchanged.   Electronically Signed   By: Marcello Moores  Register   On: 08/22/2013 08:19   Dg Chest Port 1 View  08/21/2013   CLINICAL DATA:  Postop and partial small bowel resection.  EXAM: PORTABLE CHEST - 1 VIEW  COMPARISON:  DG CHEST 1V PORT dated 08/20/2013  FINDINGS: Temporary dialysis catheter in the right jugular vein and the tip is in the lower SVC. There is a left jugular central line with the tip in the SVC. Hazy densities at the left lung base suggests small effusion and atelectasis. No evidence for a pneumothorax. Heart size is within normal limits. Tracheostomy tube is present. Nasogastric tube has been removed.  IMPRESSION: Densities at the left lung base suggest atelectasis and possibly pleural effusion.  Support apparatuses as described.   Electronically Signed   By: Markus Daft M.D.   On: 08/21/2013 07:18    ASSESSMENT / PLAN:  PULMONARY A: Acute respiratory failure in setting of perforated bowel.   S/p Tracheostomy 1/22. P: -  SBTs as tolerated but doubt we will make much progress unless neg balance achieved -prn BD's  CARDIOVASCULAR A: Septic shock from perforated bowel >> off pressors 2/02. Hx of A fib >> previously on amiodarone. P: -dc IV fluids  RENAL A: Acute kidney injury 2nd to ischemic bowel, shock >>  started on HD January 2015. Ana sarca P: -HD per Renal, needs neg balance to enable wean, more important to avoid hypotension -? Consider CVVH here, reassuring that he is non oliguric  GASTROINTESTINAL A: Perforated bowel. Protein calorie malnutrition. P: -continue TNA -protonix for SUP  HEMATOLOGIC A: Anemia of critical illness vs acute blood loss - no obvious source P: -3rd U PRBC today -SCD for DVT prevention >> hold off SQ heparin  INFECTIOUS A: Septic shock from bowel perforation. P: -continue zosyn x   ENDOCRINE A: Hyperglycemia. P: -SSI  NEUROLOGIC A: Acute encephalopathy 2nd to sepsis. P: -int fent while on vent  Updated family at bedside 2/3.  Explained long term prognosis guarded  Care during the described time interval was provided by me and/or other providers on the critical care team.  I have reviewed this patient's available data, including medical history, events of note, physical examination and test results as part of my evaluation  CC time x 86m  ALVA,RAKESH V.   08/22/2013, 9:29 AM

## 2013-08-22 NOTE — Progress Notes (Signed)
PARENTERAL NUTRITION CONSULT NOTE - FOLLOW UP  Pharmacy Consult for TPN Indication: perforated viscous  No Known Allergies  Patient Measurements: Height: 5\' 11"  (180.3 cm) (5'11") Weight: 193 lb 12.6 oz (87.9 kg) IBW/kg (Calculated) : 75.3 Weight at [place] 89.3 kg  Vital Signs: Temp: 98.2 F (36.8 C) (02/05 0342) Temp src: Oral (02/05 0342) BP: 138/71 mmHg (02/05 0700) Pulse Rate: 109 (02/05 0725) Intake/Output from previous day: 02/04 0701 - 02/05 0700 In: 3260.8 [I.V.:950.8; IV Piggyback:150; TPN:2160] Out: 2650 [KZSWF:0932; Drains:575; Stool:240] Intake/Output from this shift:    Labs:  Recent Labs  08/20/13 0430 08/20/13 1057 08/21/13 0355 08/22/13 0400  WBC 34.2*  --  40.2* 29.9*  HGB 6.5* 8.2* 8.3* 6.4*  HCT 18.6* 24.0* 23.2* 18.4*  PLT 271  --  228 205     Recent Labs  08/20/13 0430  08/20/13 1230 08/21/13 0355 08/22/13 0400  NA 137  < > 137 137 138  K 3.4*  < > 3.7 3.6* 3.3*  CL 100  --  101 102 103  CO2 21  --  18* 17* 16*  GLUCOSE 128*  < > 136* 137* 157*  BUN 95*  --  95* 100* 107*  CREATININE 2.95*  --  2.87* 2.98* 3.24*  CALCIUM 6.7*  --  6.5* 6.5* 6.4*  MG 1.8  --   --  1.7 1.8  PHOS 5.9*  --   --  5.6* 5.2*  PROT  --   --   --   --  4.1*  ALBUMIN 1.5*  --   --   --  1.3*  AST  --   --   --   --  20  ALT  --   --   --   --  13  ALKPHOS  --   --   --   --  71  BILITOT  --   --   --   --  1.0  < > = values in this interval not displayed. Estimated Creatinine Clearance: 22.6 ml/min (by C-G formula based on Cr of 3.24).    Recent Labs  08/21/13 1919 08/21/13 2325 08/22/13 0343  GLUCAP 184* 151* 152*    Medications:  Infusions:  . dextrose    . TPN (CLINIMIX) Adult without lytes 80 mL/hr at 08/22/13 0400   And  . fat emulsion 500 kcal (08/22/13 0400)  . vasopressin (PITRESSIN) infusion - *FOR SHOCK* Stopped (08/19/13 0500)    Insulin Requirements in the past 24 hours:  Started on insulin drip 2/2.  Transitioned to lantus 20  units BID today per ICU hyperglycemia protocol.  Current Nutrition:  At [facility], was on Clinimix E 5/20 at 65 ml/hr. 2/1 changed to Clinimix 5/15 at 75 ml/hr + lipids at 10 mlg/hr 2/4 Clinimix 5/15 at 80 ml/hr + lipids at 10 ml/hr  Nutritional Goals: per RD note 2/2 2130 kCal, 120-135 grams of protein per day  Admit: 73 year old male with SBO and ischemic bowel s/p resection at [facility].  Was transferred to [facility] for continuing hemodialysis and inability to wean from ventilator.  On Sunday 2/1 developed a new bowel perforation at his anastomosis site and is s/p ex lap with small bowel resection and wound vac placement. 2/2 did not tolerate HD, got 1 hr 2/3: OR small bowel anastomosis, colostomy, gastrostomy tube, blood transfusion pre op for Hg 5.9 2/4: POD 3/1  reexploration with small bowel anastmosis, colostomy g tube, intubated; no HD planned for today 2/5: POD  4/2 - Holding off on HD  GI: new bowel perforation s/p ex lap; was on TPN at [facility].  To OR 2/3  for small bowel anastomosis, colostomy, gastrostomy tube. Per CCS " He is high risk still for abdominal issues, continue abx for peritonitis, g tube to drainage, hold on enteral feeds until small bowel healed and has bowel function"  Nutrition: prealbumin 2/2 11.5 reflects inflammation post bowel perf , TPN not at goal rate due to volume restrictions Endo:  No insulin in TPN at [facility] per discussion with [facility] pharmacist on 2/1. Insulin drip started 2/2. . I previously did not think the cause of hyperglycemia was the TPN since he was on TPN with no insulin at [facility].  However, looking back at the CBGs while at [facility] they were elevated. On Lantus 20 units BID and q4h SSI.  CBGs 112-184. Lytes: No electrolytes in current TPN,  K 3.3 today.  phos coming down to 5.2, mag 1.8 Renal: newly HD-dependent; on HD at [facility], only got 1 hr HD 2/2 due to tachycardia and decreased BP. Plan is no HD today - UOP 0.9 mL/kg/hr. Hepatobil: LFTs ok; trig 291   care, PPI IV TPN Access: CVC   Plan:   1. Continue Clinimix 5/15 (no electrolytes) at 80 ml/hr + 20% lipids at 10 ml/hr.  This will provide 96 gm of protein and 1843 kcals.  This is 80% of protein and 87% kcal goals.  Goal rate of 100 m/hr would provide full support (120 gm protein and 2184 kcals)  but do not want to increase rate due to volume concerns.  2. Continue electrolyte-free formula until renal advised to change to formula with standard lytes. 3. No insulin in TPN. Continue lantus 20 units BID today 4. Will give K+ Runs x 3 today.  BMET in AM.   Heide Guile, PharmD, BCPS Clinical Pharmacist Pager [phone]   08/22/2013 8:01 AM

## 2013-08-22 NOTE — Progress Notes (Signed)
2 Days Post-Op  Subjective: Responds, on vent  Objective: Vital signs in last 24 hours: Temp:  [98 F (36.7 C)-99.1 F (37.3 C)] 98.2 F (36.8 C) (02/05 0342) Pulse Rate:  [102-123] 109 (02/05 0725) Resp:  [22-36] 31 (02/05 0725) BP: (119-188)/(62-97) 138/71 mmHg (02/05 0700) SpO2:  [100 %] 100 % (02/05 0725) Arterial Line BP: (157-207)/(59-128) 200/79 mmHg (02/05 0700) FiO2 (%):  [40 %] 40 % (02/05 0725) Weight:  [193 lb 12.6 oz (87.9 kg)] 193 lb 12.6 oz (87.9 kg) (02/05 0500)    Intake/Output from previous day: 02/04 0701 - 02/05 0700 In: 3260.8 [I.V.:950.8; IV Piggyback:150; TPN:2160] Out: 2650 [KWIOX:7353; Drains:575; Stool:240] Intake/Output this shift:    General appearance: intubated, ill appearing GI: wound open without infection, g tube draining, ostomy with edema, dark stool in bag (?melena)  Lab Results:   Recent Labs  08/21/13 0355 08/22/13 0400  WBC 40.2* 29.9*  HGB 8.3* 6.4*  HCT 23.2* 18.4*  PLT 228 205   BMET  Recent Labs  08/21/13 0355 08/22/13 0400  NA 137 138  K 3.6* 3.3*  CL 102 103  CO2 17* 16*  GLUCOSE 137* 157*  BUN 100* 107*  CREATININE 2.98* 3.24*  CALCIUM 6.5* 6.4*    Studies/Results: Dg Chest Port 1 View  08/22/2013   CLINICAL DATA:  Pleural effusions.  EXAM: PORTABLE CHEST - 1 VIEW  COMPARISON:  DG CHEST 1V PORT dated 08/21/2013  FINDINGS: Tracheostomy tube and bilateral IJ central lines are in stable position. Left lower lobe mild atelectasis and/or infiltrate. Small left pleural effusion cannot be excluded. Heart size normal. Pulmonary vascularity normal. Degenerative changes thoracic spine .  IMPRESSION: 1. Stable tracheostomy tube and central line positions. 2. Left lower lobe atelectasis and/or infiltrate with small left pleural effusion, unchanged.   Electronically Signed   By: Marcello Moores  Register   On: 08/22/2013 08:19   Dg Chest Port 1 View  08/21/2013   CLINICAL DATA:  Postop and partial small bowel resection.  EXAM: PORTABLE  CHEST - 1 VIEW  COMPARISON:  DG CHEST 1V PORT dated 08/20/2013  FINDINGS: Temporary dialysis catheter in the right jugular vein and the tip is in the lower SVC. There is a left jugular central line with the tip in the SVC. Hazy densities at the left lung base suggests small effusion and atelectasis. No evidence for a pneumothorax. Heart size is within normal limits. Tracheostomy tube is present. Nasogastric tube has been removed.  IMPRESSION: Densities at the left lung base suggest atelectasis and possibly pleural effusion.  Support apparatuses as described.   Electronically Signed   By: Markus Daft M.D.   On: 08/21/2013 07:18    Anti-infectives: Anti-infectives   Start     Dose/Rate Route Frequency Ordered Stop   08/18/13 1400  piperacillin-tazobactam (ZOSYN) IVPB 2.25 g     2.25 g 100 mL/hr over 30 Minutes Intravenous 3 times per day 08/18/13 1124     08/18/13 0500  piperacillin-tazobactam (ZOSYN) IVPB 3.375 g  Status:  Discontinued     3.375 g 12.5 mL/hr over 240 Minutes Intravenous 3 times per day 08/18/13 0434 08/18/13 1124      Assessment/Plan: Pod 4/\2 reexploration with small bowel anastmosis, colostomy and  g tube   He is high risk still for abdominal issues, continue abx for peritonitis, g tube to drainage, hold on enteral feeds until small bowel healed and has bowel function  ? Source of decreased hct, getting transfused then recheck, ? From gi source  right now, hold lovenox this am, will increase protonix also Appreciate ccm and renal assistance   [facility] 08/22/2013

## 2013-08-22 NOTE — Progress Notes (Signed)
[facility] ADULT ICU REPLACEMENT PROTOCOL FOR AM LAB REPLACEMENT ONLY  The patient does not apply for the [facility] Adult ICU Electrolyte Replacment Protocol based on the criteria listed below:   1. Is GFR >/= 40 ml/min? no  Patient's GFR today is 18 2. Is urine output >/= 0.5 ml/kg/hr for the last 6 hours? no Patient's UOP is  ml/kg/hr 3. Is BUN < 60 mg/dL? no  Patient's BUN today is 107 4. Abnormal electrolyte(s):K 3.3, mag 1.8 5. Ordered repletion with: NA 6. If a panic level lab has been reported, has the CCM MD in charge been notified? yes.   Physician:  Dr Jimmy Footman  Orvil Feil, Trong Gosling A 08/22/2013 5:32 AM

## 2013-08-22 NOTE — Progress Notes (Signed)
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    Subjective:  Off pressors, good BP Hb dropped overnight, transfused Good UOP   02/04 0701 - 02/05 0700 In: 3260.8 [I.V.:950.8; IV Piggyback:150; TPN:2160] Out: 2650 [CHYIF:0277; Drains:575; Stool:240]  Filed Weights   08/20/13 0500 08/21/13 0500 08/22/13 0500  Weight: 88.1 kg (194 lb 3.6 oz) 87.7 kg (193 lb 5.5 oz) 87.9 kg (193 lb 12.6 oz)    Current meds: reviewed Current Labs: reviewed   Physical Exam:  Blood pressure 146/79, pulse 113, temperature 99.1 F (37.3 C), temperature source Oral, resp. rate 30, height 5\' 11"  (1.803 m), weight 87.9 kg (193 lb 12.6 oz), SpO2 100.00%. GEN: chronically ill appaering, Trach, more alert ENT: Trach and R IJ nontunneled HD Catheter noted  EYES: EOMI. Eyes open to noise, stimullus  CV: tachy, regular  PULM: +Bs b/l  ABD: wound vac at midline  SKIN: large ventral abd incision  EXT: 2+ pitting edema b/l  Assessment/Plan 1. Dialysis Dependent AKI, progressing to ESRD: Has been on iHD tentative MWF schedule.  Did not tolerate Mon treatment 2/2 hemodynamics.  Now that making urine and stable GFR, will watch carefully.  If needs HD, agree taht CRRT might be best choice, but hopeful he remains stable and can then consider diuretics to assist with weaning.  Follow closely again today.  Overall labs are stable but BUN inching up.   2. Anemia: transfusions per CCM and surgery.  Not currently on ESA 3. Septic shock and SB performation: per surgery and CCM.  Improving.    Pearson Grippe MD 08/22/2013, 9:44 AM   Recent Labs Lab 08/20/13 0430  08/20/13 1230 08/21/13 0355 08/22/13 0400  NA 137  < > 137 137 138  K 3.4*  < > 3.7 3.6* 3.3*  CL 100  --  101 102 103  CO2 21  --  18* 17* 16*  GLUCOSE 128*  < > 136* 137* 157*  BUN 95*  --  95* 100* 107*  CREATININE 2.95*   --  2.87* 2.98* 3.24*  CALCIUM 6.7*  --  6.5* 6.5* 6.4*  PHOS 5.9*  --   --  5.6* 5.2*  < > = values in this interval not displayed.  Recent Labs Lab 08/18/13 0404  08/20/13 0430 08/20/13 1057 08/21/13 0355 08/22/13 0400  WBC 61.2*  < > 34.2*  --  40.2* 29.9*  NEUTROABS 58.8*  --   --   --   --   --   HGB 11.1*  < > 6.5* 8.2* 8.3* 6.4*  HCT 32.2*  < > 18.6* 24.0* 23.2* 18.4*  MCV 88.2  < > 87.7  --  87.2 87.6  PLT 485*  < > 271  --  228 205  < > = values in this interval not displayed.

## 2013-08-23 ENCOUNTER — Inpatient Hospital Stay (HOSPITAL_COMMUNITY): Payer: Medicare Other

## 2013-08-23 LAB — BASIC METABOLIC PANEL
BUN: 108 mg/dL — ABNORMAL HIGH (ref 6–23)
CHLORIDE: 105 meq/L (ref 96–112)
CO2: 15 meq/L — AB (ref 19–32)
Calcium: 6.8 mg/dL — ABNORMAL LOW (ref 8.4–10.5)
Creatinine, Ser: 3.16 mg/dL — ABNORMAL HIGH (ref 0.50–1.35)
GFR calc Af Amer: 21 mL/min — ABNORMAL LOW (ref >90)
GFR calc non Af Amer: 18 mL/min — ABNORMAL LOW (ref >90)
GLUCOSE: 142 mg/dL — AB (ref 70–99)
POTASSIUM: 3.3 meq/L — AB (ref 3.7–5.3)
Sodium: 138 mEq/L (ref 137–147)

## 2013-08-23 LAB — CULTURE, BLOOD (ROUTINE X 2)
CULTURE: NO GROWTH
Culture: NO GROWTH

## 2013-08-23 LAB — TYPE AND SCREEN
ABO/RH(D): A POS
Antibody Screen: NEGATIVE
UNIT DIVISION: 0

## 2013-08-23 LAB — CBC
HEMATOCRIT: 21.1 % — AB (ref 39.0–52.0)
HEMOGLOBIN: 7.4 g/dL — AB (ref 13.0–17.0)
MCH: 30.5 pg (ref 26.0–34.0)
MCHC: 35.1 g/dL (ref 30.0–36.0)
MCV: 86.8 fL (ref 78.0–100.0)
Platelets: 212 10*3/uL (ref 150–400)
RBC: 2.43 MIL/uL — ABNORMAL LOW (ref 4.22–5.81)
RDW: 15.2 % (ref 11.5–15.5)
WBC: 26.7 10*3/uL — AB (ref 4.0–10.5)

## 2013-08-23 LAB — GLUCOSE, CAPILLARY
GLUCOSE-CAPILLARY: 130 mg/dL — AB (ref 70–99)
GLUCOSE-CAPILLARY: 99 mg/dL (ref 70–99)
Glucose-Capillary: 135 mg/dL — ABNORMAL HIGH (ref 70–99)
Glucose-Capillary: 124 mg/dL — ABNORMAL HIGH (ref 70–99)
Glucose-Capillary: 95 mg/dL (ref 70–99)

## 2013-08-23 MED ORDER — TRACE MINERALS CR-CU-F-FE-I-MN-MO-SE-ZN IV SOLN
INTRAVENOUS | Status: AC
  Administered 2013-08-23: 18:00:00 via INTRAVENOUS
  Filled 2013-08-23: qty 2000

## 2013-08-23 MED ORDER — LACTATED RINGERS IV SOLN: INTRAVENOUS | Status: DC

## 2013-08-23 MED ORDER — POTASSIUM CHLORIDE 10 MEQ/50ML IV SOLN
10.0000 meq | INTRAVENOUS | Status: AC
  Administered 2013-08-23 (×3): 10 meq via INTRAVENOUS
  Filled 2013-08-23 (×3): qty 50

## 2013-08-23 MED ORDER — ALTEPLASE 2 MG IJ SOLR: 2.0000 mg | Freq: Once | INTRAMUSCULAR | Status: AC | PRN

## 2013-08-23 MED ORDER — HEPARIN SODIUM (PORCINE) 1000 UNIT/ML DIALYSIS: 1000.0000 [IU] | INTRAMUSCULAR | Status: DC | PRN

## 2013-08-23 MED ORDER — FAT EMULSION 20 % IV EMUL
250.0000 mL | INTRAVENOUS | Status: AC
  Administered 2013-08-23: 250 mL via INTRAVENOUS
  Filled 2013-08-23: qty 250

## 2013-08-23 MED ORDER — SODIUM CHLORIDE 0.9 % IV SOLN: 100.0000 mL | INTRAVENOUS | Status: DC | PRN

## 2013-08-23 MED ORDER — NEPRO/CARBSTEADY PO LIQD: 237.0000 mL | ORAL | Status: DC | PRN

## 2013-08-23 NOTE — Progress Notes (Signed)
Responded with grimace when I turned light on appropriately, agree with above, maybe over next several days if doing well can trickle feeds via g tube

## 2013-08-23 NOTE — Progress Notes (Signed)
PULMONARY  / CRITICAL CARE MEDICINE HISTORY AND PHYSICAL EXAMINATION  Name: Jason Harmon MRN: [id number] DOB: 30-May-1941    ADMISSION DATE:  08/18/2013  CHIEF COMPLAINT:  Bowel perforation  BRIEF PATIENT DESCRIPTION:  73 yo male with SBO/ischemic bowel s/p resection with subsequent complicated medical course including respiratory failure and inability to wean from ventilator. He is now admitted with bowel perforation at anastomosis site.   HISTORY OF PRESENT ILLNESS:  73 yo male with renal failure on dialysis and multiple recent surgeries for ischemic bowel presenting from [facility] with rigid abdomen and CT demonstrating bowel perforation. He has had a very complicated course over the past month when he initially presented to an OSH 1/9 with SBO/ischemic bowel requiring laparotomy and small bowel resection. His course was complicated by ARF, now on dialysis and respiratory failure resulting in tracheostomy and prolonged mechanical ventilation.  SIGNIFICANT EVENTS: 1/22 Tracheostomy 2/01 Transfer from [facility], to ER, to [facility] ICU.  CCS consulted 2/1 Exploratory laparotomy with segmental resection of small bowel anastomosis and sigmoid colon with placement of abdominal wound vac 2/02 Off pressors, did not tolerate HD 2/4 small bowel anastomosis with gastrostomy & End sigmoid colostomy   STUDIES:  CT A/P with contrast 08/17/2013 - bowel perforation, likely anastomotic breakdown at proximal enteroenterostomy with pneumoperitoneum and spilling of oral contrast, moderate ascites with peritonitis  LINES / TUBES: Tracheostomy 1/22>>> R IJ HD catheter 1/12>>>  CULTURES:  ANTIBIOTICS: Zosyn 2/1   SUBJECTIVE: Afebrile No obvious pain Good UO Weans well on 10/5   PHYSICAL EXAM VITAL SIGNS: Temp:  [98.2 F (36.8 C)-99.8 F (37.7 C)] 99.2 F (37.3 C) (02/06 0737) Pulse Rate:  [94-115] 104 (02/06 0800) Resp:  [21-35] 26 (02/06 0800) BP: (120-168)/(65-107) 145/82 mmHg (02/06 0800) SpO2:   [99 %-100 %] 100 % (02/06 0800) Arterial Line BP: (146-208)/(53-118) 203/80 mmHg (02/06 0800) FiO2 (%):  [40 %] 40 % (02/06 0800)  VENTILATOR SETTINGS: Vent Mode:  [-] PSV;CPAP FiO2 (%):  [40 %] 40 % Set Rate:  [20 bmp] 20 bmp PEEP:  [5 cmH20] 5 cmH20 Pressure Support:  [10 QIW97-98 cmH20] 10 cmH20 Plateau Pressure:  [16 cmH20-18 cmH20] 17 cmH20  INTAKE / OUTPUT: Intake/Output     02/05 0701 - 02/06 0700 02/06 0701 - 02/07 0700   I.V. (mL/kg) 1030 (11.7) 40 (0.5)   Blood 362.5    IV Piggyback 300    TPN 2160 90   Total Intake(mL/kg) 3852.5 (43.8) 130 (1.5)   Urine (mL/kg/hr) 2280 (1.1) 150 (0.8)   Drains 300 (0.1)    Stool 300 (0.1)    Total Output 2880 150   Net +972.5 -20          PHYSICAL EXAMINATION: General: ill appearing, awake Neuro: awake,does not follow simple commands HEENT:  Trach site clean Cardiovascular:  Tachycardic, no murmurs/rubs/gallops Lungs:  Coarse breath sounds anteriorly Abdomen:  Distended, mild tenderness,ostomy Musculoskeletal:  2+ edema Skin:  No rash, ana sarca+  LABS:  CBC Recent Labs     08/21/13  0355  08/22/13  0400  08/22/13  1500  08/23/13  0350  WBC  40.2*  29.9*   --   26.7*  HGB  8.3*  6.4*  7.6*  7.4*  HCT  23.2*  18.4*  21.6*  21.1*  PLT  228  205   --   212    Coag's Recent Labs     08/22/13  1100  INR  1.36    BMET Recent Labs  08/21/13  0355  08/22/13  0400  08/23/13  0350  NA  137  138  138  K  3.6*  3.3*  3.3*  CL  102  103  105  CO2  17*  16*  15*  BUN  100*  107*  108*  CREATININE  2.98*  3.24*  3.16*  GLUCOSE  137*  157*  142*    Electrolytes Recent Labs     08/21/13  0355  08/22/13  0400  08/23/13  0350  CALCIUM  6.5*  6.4*  6.8*  MG  1.7  1.8   --   PHOS  5.6*  5.2*   --     Sepsis Markers No results found for this basename: LACTICACIDVEN, PROCALCITON, O2SATVEN,  in the last 72 hours  ABG No results found for this basename: PHART, PCO2ART, PO2ART,  in the last 72  hours  Liver Enzymes Recent Labs     08/22/13  0400  AST  20  ALT  13  ALKPHOS  71  BILITOT  1.0  ALBUMIN  1.3*   Glucose Recent Labs     08/22/13  1352  08/22/13  1607  08/22/13  1927  08/22/13  2342  08/23/13  0336  08/23/13  0738  GLUCAP  142*  125*  105*  124*  135*  130*    Imaging Dg Chest Port 1 View  08/23/2013   CLINICAL DATA:  Followup pleural effusions.  EXAM: PORTABLE CHEST - 1 VIEW  COMPARISON:  08/22/2013.  FINDINGS: Tracheostomy, left IJ central line and right IJ vas cath remain present. There is left-greater-than-right perihilar and basilar airspace opacity, most compatible with pulmonary edema/ volume overload. Left costophrenic angle is excluded from view. No focal consolidation is identified.  IMPRESSION: No interval change in the chest. Stable support apparatus and findings compatible with mild volume overload.   Electronically Signed   By: Dereck Ligas M.D.   On: 08/23/2013 07:55   Dg Chest Port 1 View  08/22/2013   CLINICAL DATA:  Pleural effusions.  EXAM: PORTABLE CHEST - 1 VIEW  COMPARISON:  DG CHEST 1V PORT dated 08/21/2013  FINDINGS: Tracheostomy tube and bilateral IJ central lines are in stable position. Left lower lobe mild atelectasis and/or infiltrate. Small left pleural effusion cannot be excluded. Heart size normal. Pulmonary vascularity normal. Degenerative changes thoracic spine .  IMPRESSION: 1. Stable tracheostomy tube and central line positions. 2. Left lower lobe atelectasis and/or infiltrate with small left pleural effusion, unchanged.   Electronically Signed   By: Marcello Moores  Register   On: 08/22/2013 08:19    ASSESSMENT / PLAN:  PULMONARY A: Acute respiratory failure in setting of perforated bowel.  S/p Tracheostomy 1/22. P: -  SBTs as tolerated , I am surprised with progress - can go to ATC -prn BD's  CARDIOVASCULAR A: Septic shock from perforated bowel >> off pressors 2/02. Hx of A fib >> previously on amiodarone. P: -dc IV  fluids  RENAL A: Acute kidney injury 2nd to ischemic bowel, shock >> started on HD January 2015. Ana sarca P: - needs neg balance to enable wean, more important to avoid hypotension -? Consider CVVH here, reassuring that he is non oliguric  GASTROINTESTINAL A: Perforated bowel. Protein calorie malnutrition. P: -continue TNA -protonix for SUP  HEMATOLOGIC A: Anemia of critical illness vs acute blood loss - no obvious source S/p 3u PRBC so far P: -SCD for DVT prevention >> hold off SQ heparin x 24h per surgery  INFECTIOUS A: Septic shock from bowel perforation. P: -continue zosyn x 7ds total  ENDOCRINE A: Hyperglycemia. P: -SSI  NEUROLOGIC A: Acute encephalopathy 2nd to sepsis. P: -int fent while on vent  Updated family at bedside 2/3.  Explained long term prognosis guarded  Care during the described time interval was provided by me and/or other providers on the critical care team.  I have reviewed this patient's available data, including medical history, events of note, physical examination and test results as part of my evaluation  CC time x 77m  Shubham Thackston V.   08/23/2013, 9:05 AM

## 2013-08-23 NOTE — Progress Notes (Signed)
Patient ID: Jason Harmon, male   DOB: 1941-06-11, 73 y.o.   MRN: [id number] 3 Days Post-Op  Subjective: Pt on vent.  Stares at me but won't nod to answer my questions  Objective: Vital signs in last 24 hours: Temp:  [98.2 F (36.8 C)-99.8 F (37.7 C)] 99.2 F (37.3 C) (02/06 0737) Pulse Rate:  [94-115] 100 (02/06 0727) Resp:  [21-35] 27 (02/06 0727) BP: (120-168)/(65-107) 134/73 mmHg (02/06 0727) SpO2:  [99 %-100 %] 100 % (02/06 0727) Arterial Line BP: (146-208)/(53-118) 191/68 mmHg (02/06 0700) FiO2 (%):  [40 %] 40 % (02/06 0727)    Intake/Output from previous day: 02/05 0701 - 02/06 0700 In: 3852.5 [I.V.:1030; Blood:362.5; IV Piggyback:300; TPN:2160] Out: 2880 [Urine:2280; Drains:300; Stool:300] Intake/Output this shift:    PE: Abd: soft, but distended, diffuse anasarca, multiple areas of excoriation and skin breakdown all over abdominal wall.  Ostomy with liquid feculent output.  No frank blood or melena noted. Absent BS, midline wound clean and intact.  Lab Results:   Recent Labs  08/22/13 0400 08/22/13 1500 08/23/13 0350  WBC 29.9*  --  26.7*  HGB 6.4* 7.6* 7.4*  HCT 18.4* 21.6* 21.1*  PLT 205  --  212   BMET  Recent Labs  08/22/13 0400 08/23/13 0350  NA 138 138  K 3.3* 3.3*  CL 103 105  CO2 16* 15*  GLUCOSE 157* 142*  BUN 107* 108*  CREATININE 3.24* 3.16*  CALCIUM 6.4* 6.8*   PT/INR  Recent Labs  08/22/13 1100  LABPROT 16.4*  INR 1.36   CMP     Component Value Date/Time   NA 138 08/23/2013 0350   K 3.3* 08/23/2013 0350   CL 105 08/23/2013 0350   CO2 15* 08/23/2013 0350   GLUCOSE 142* 08/23/2013 0350   BUN 108* 08/23/2013 0350   CREATININE 3.16* 08/23/2013 0350   CALCIUM 6.8* 08/23/2013 0350   PROT 4.1* 08/22/2013 0400   ALBUMIN 1.3* 08/22/2013 0400   AST 20 08/22/2013 0400   ALT 13 08/22/2013 0400   ALKPHOS 71 08/22/2013 0400   BILITOT 1.0 08/22/2013 0400   GFRNONAA 18* 08/23/2013 0350   GFRAA 21* 08/23/2013 0350   Lipase  No results found for this  basename: lipase       Studies/Results: Dg Chest Port 1 View  08/23/2013   CLINICAL DATA:  Followup pleural effusions.  EXAM: PORTABLE CHEST - 1 VIEW  COMPARISON:  08/22/2013.  FINDINGS: Tracheostomy, left IJ central line and right IJ vas cath remain present. There is left-greater-than-right perihilar and basilar airspace opacity, most compatible with pulmonary edema/ volume overload. Left costophrenic angle is excluded from view. No focal consolidation is identified.  IMPRESSION: No interval change in the chest. Stable support apparatus and findings compatible with mild volume overload.   Electronically Signed   By: Dereck Ligas M.D.   On: 08/23/2013 07:55   Dg Chest Port 1 View  08/22/2013   CLINICAL DATA:  Pleural effusions.  EXAM: PORTABLE CHEST - 1 VIEW  COMPARISON:  DG CHEST 1V PORT dated 08/21/2013  FINDINGS: Tracheostomy tube and bilateral IJ central lines are in stable position. Left lower lobe mild atelectasis and/or infiltrate. Small left pleural effusion cannot be excluded. Heart size normal. Pulmonary vascularity normal. Degenerative changes thoracic spine .  IMPRESSION: 1. Stable tracheostomy tube and central line positions. 2. Left lower lobe atelectasis and/or infiltrate with small left pleural effusion, unchanged.   Electronically Signed   By: Marcello Moores  Register   On: 08/22/2013 08:19  Anti-infectives: Anti-infectives   Start     Dose/Rate Route Frequency Ordered Stop   08/18/13 1400  piperacillin-tazobactam (ZOSYN) IVPB 2.25 g     2.25 g 100 mL/hr over 30 Minutes Intravenous 3 times per day 08/18/13 1124     08/18/13 0500  piperacillin-tazobactam (ZOSYN) IVPB 3.375 g  Status:  Discontinued     3.375 g 12.5 mL/hr over 240 Minutes Intravenous 3 times per day 08/18/13 0434 08/18/13 1124       Assessment/Plan 1.Pod 5/3 reexploration with small bowel anastmosis, colostomy and g tube    Plan: 1. Cont g-tube to drainage and TNA for nutritional support.  His bowels aren't  ready for enteral feeds yet.  He is at high risk for abdominal issues still 2. Cont abx therapy 3. Cont WD dressing changes to abdominal wound and routine ostomy care 4. hgb responded appropriately to 2 units yesterday 5. Would hold lovenox still today and if hgb remains stable, may be able to restart tomorrow.   LOS: 5 days    Jason Harmon 08/23/2013, 8:24 AM Pager: [phone]

## 2013-08-23 NOTE — Treatment Plan (Signed)
Paged from the bedside nurse, new consult for [place].  Team will provide initial consult in am 08/23/13.  However nurse reports pouch placed in OR is coming loose, so I have reviewed pouch change procedure with nurse and provided her with lawson #'s so she can order appropriate supplies from [employer].    Kellie Simmering # [phone], Lawson# [phone], Kellie Simmering #[phone]  [place] team will follow along with you for ostomy care and teaching. [place], [place]

## 2013-08-23 NOTE — Progress Notes (Signed)
[facility] ADULT ICU REPLACEMENT PROTOCOL FOR AM LAB REPLACEMENT ONLY  The patient does not apply for the [facility] Adult ICU Electrolyte Replacment Protocol based on the criteria listed below:   1. Is GFR >/= 40 ml/min? no  Patient's GFR today is 108 2. Is urine output >/= 0.5 ml/kg/hr for the last 6 hours? no Patient's UOP is  ml/kg/hr 3. Is BUN < 60 mg/dL? no  Patient's BUN today is  4. Abnormal electrolyte(s): K 3.35. Ordered repletion with: NA 6. If a panic level lab has been reported, has the CCM MD in charge been notified? yes.   Physician:  Dr Nelda Marseille He will defer to rounding physician  [facility], Marvie Brevik A 08/23/2013 6:09 AM

## 2013-08-23 NOTE — Consult Note (Signed)
WOC ostomy consult note Stoma type/location: LLQ, colostomy Stomal assessment/size: unable to assess, pouch just changed yesterday pm Peristomal assessment: not assessed today Treatment options for stomal/peristomal skin: I did have the bedside nurse add a 2"barrier ring yesterday with the pouch change as she reported the stoma is not very budded from the skin Output liquid green Ostomy pouching: 2pc.in place, will need to possible offcenter wafer next time to get the wafer away from the open midline incision. Also noted that this patient has significant edema in his abdomen and multiple blistered areas, open abdominal wound Education provided:  Pt is on the vent and trached.  He is not able or will not respond to me.  He will track me in the room but will not follow any commands or even nod his head for me.  This is his baseline per the bedside nursing staff. No family at the bedside at the current time. Wife and kids are supportive of his care, will try to follow up with them on learning ostomy care if needed.   [facility] team will follow along with you for ostomy care and teaching [facility] RN,CWOCN [phone]

## 2013-08-23 NOTE — Progress Notes (Signed)
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    Subjective:  On PSV 10/5 Good UOP SCr/BUN stable  02/05 0701 - 02/06 0700 In: 3852.5 [I.V.:1030; Blood:362.5; IV Piggyback:300; TPN:2160] Out: 2880 [Urine:2280; Drains:300; Stool:300]  Filed Weights   08/20/13 0500 08/21/13 0500 08/22/13 0500  Weight: 88.1 kg (194 lb 3.6 oz) 87.7 kg (193 lb 5.5 oz) 87.9 kg (193 lb 12.6 oz)    Current meds: reviewed Current Labs: reviewed   Physical Exam:  Blood pressure 149/79, pulse 105, temperature 99.2 F (37.3 C), temperature source Oral, resp. rate 26, height 5\' 11"  (1.803 m), weight 87.9 kg (193 lb 12.6 oz), SpO2 98.00%. GEN: chronically ill appaering, Trach, more awake and alert ENT: Trach and R IJ nontunneled HD Catheter noted  EYES: EOMI. Eyes open to noise, stimullus  CV: tachy, regular  PULM: +Bs b/l  ABD: wound vac at midline  SKIN: large ventral abd incision  EXT: 2+ pitting edema b/l  Assessment/Plan 1. Dialysis Dependent AKI, progressing to ESRD: Has been on iHD tentative MWF schedule.  Did not tolerate Mon (2/2) treatment 2/2 hemodynamics.  Now that making urine and stable GFR, will watch carefully. Hopeful he remains stable and can then consider diuretics to assist with weaning.  Follow closely again today.  K stable.  BUN/SCr stable 2. Anemia: transfusions per CCM and surgery.  Not currently on ESA 3. Septic shock and SB performation: per surgery and CCM.  Improving.   4. Mild metabolic acidosis: follow for now.  If PO started can supplement with NaHCO3  Jason Grippe MD 08/23/2013, 10:17 AM   Recent Labs Lab 08/20/13 0430  08/21/13 0355 08/22/13 0400 08/23/13 0350  NA 137  < > 137 138 138  K 3.4*  < > 3.6* 3.3* 3.3*  CL 100  < > 102 103 105  CO2 21  < > 17* 16* 15*  GLUCOSE 128*  < > 137* 157* 142*  BUN 95*  < > 100* 107* 108*   CREATININE 2.95*  < > 2.98* 3.24* 3.16*  CALCIUM 6.7*  < > 6.5* 6.4* 6.8*  PHOS 5.9*  --  5.6* 5.2*  --   < > = values in this interval not displayed.  Recent Labs Lab 08/18/13 0404  08/21/13 0355 08/22/13 0400 08/22/13 1500 08/23/13 0350  WBC 61.2*  < > 40.2* 29.9*  --  26.7*  NEUTROABS 58.8*  --   --   --   --   --   HGB 11.1*  < > 8.3* 6.4* 7.6* 7.4*  HCT 32.2*  < > 23.2* 18.4* 21.6* 21.1*  MCV 88.2  < > 87.2 87.6  --  86.8  PLT 485*  < > 228 205  --  212  < > = values in this interval not displayed.

## 2013-08-23 NOTE — Anesthesia Postprocedure Evaluation (Signed)
  Anesthesia Post-op Note  Patient: Jason Harmon  Procedure(s) Performed: Procedure(s): EXPLORATORY LAPAROTOMY WITH  SMALL BOWEL RESECTION (N/A) G-TUBE PLACEMENT (N/A) COLOSTOMY (N/A)  Patient Location: ICU  Anesthesia Type:General  Level of Consciousness: sedated  Airway and Oxygen Therapy: Patient remains intubated per anesthesia plan  Post-op Pain: none  Post-op Assessment: Post-op Vital signs reviewed, Patient's Cardiovascular Status Stable and Respiratory Function Stable  Post-op Vital Signs: Reviewed and stable  Complications: No apparent anesthesia complications

## 2013-08-23 NOTE — Progress Notes (Signed)
PARENTERAL NUTRITION CONSULT NOTE - FOLLOW UP  Pharmacy Consult for TPN Indication: perforated viscous  No Known Allergies  Patient Measurements: Height: 5\' 11"  (180.3 cm) (5'11") Weight: 193 lb 12.6 oz (87.9 kg) IBW/kg (Calculated) : 75.3 Weight at [facility] 89.3 kg  Vital Signs: Temp: 99.2 F (37.3 C) (02/06 0737) Temp src: Oral (02/06 0737) BP: 145/82 mmHg (02/06 0800) Pulse Rate: 104 (02/06 0800) Intake/Output from previous day: 02/05 0701 - 02/06 0700 In: 3852.5 [I.V.:1030; Blood:362.5; IV Piggyback:300; TPN:2160] Out: 2880 [Urine:2280; Drains:300; Stool:300] Intake/Output from this shift: Total I/O In: 130 [I.V.:40; TPN:90] Out: 150 [Urine:150]  Labs:  Recent Labs  08/21/13 0355 08/22/13 0400 08/22/13 1100 08/22/13 1500 08/23/13 0350  WBC 40.2* 29.9*  --   --  26.7*  HGB 8.3* 6.4*  --  7.6* 7.4*  HCT 23.2* 18.4*  --  21.6* 21.1*  PLT 228 205  --   --  212  INR  --   --  1.36  --   --      Recent Labs  08/21/13 0355 08/22/13 0400 08/23/13 0350  NA 137 138 138  K 3.6* 3.3* 3.3*  CL 102 103 105  CO2 17* 16* 15*  GLUCOSE 137* 157* 142*  BUN 100* 107* 108*  CREATININE 2.98* 3.24* 3.16*  CALCIUM 6.5* 6.4* 6.8*  MG 1.7 1.8  --   PHOS 5.6* 5.2*  --   PROT  --  4.1*  --   ALBUMIN  --  1.3*  --   AST  --  20  --   ALT  --  13  --   ALKPHOS  --  71  --   BILITOT  --  1.0  --    Estimated Creatinine Clearance: 23.2 ml/min (by C-G formula based on Cr of 3.16).    Recent Labs  08/22/13 2342 08/23/13 0336 08/23/13 0738  GLUCAP 124* 135* 130*    Medications:  Infusions:  . dextrose    . TPN (CLINIMIX) Adult without lytes 80 mL/hr at 08/23/13 0400   And  . fat emulsion 500 kcal (08/23/13 0400)  . lactated ringers      Insulin Requirements in the past 24 hours:  lantus 20 units BID, 6 units SSI,   Current Nutrition:  At [facility], was on Clinimix E 5/20 at 65 ml/hr. 2/1 changed to Clinimix 5/15 at 75 ml/hr + lipids at 10 mlg/hr 2/4 Clinimix 5/15  at 80 ml/hr + lipids at 10 ml/hr  Nutritional Goals: per RD note 2/2 2130 kCal, 120-135 grams of protein per day  Admit: 73 year old male with SBO and ischemic bowel s/p resection at [facility].  Was transferred to [facility] for continuing hemodialysis and inability to wean from ventilator.  On Sunday 2/1 developed a new bowel perforation at his anastomosis site and is s/p ex lap with small bowel resection and wound vac placement. 2/2 did not tolerate HD, got 1 hr 2/3: OR small bowel anastomosis, colostomy, gastrostomy tube, blood transfusion pre op for Hg 5.9 2/4: POD 3/1  reexploration with small bowel anastmosis, colostomy g tube, intubated; no HD planned for today 2/5: POD 4/2 - Holding off on HD 2/6: POD 5/3, ostomy w/ liquid feculent output, bowels not ready for enteral feeds yet  GI: new bowel perforation s/p ex lap; was on TPN at [facility].  To OR 2/3  for small bowel anastomosis, colostomy, gastrostomy tube. Per CCS " He is high risk still for abdominal issues, continue abx for peritonitis, g  tube to drainage, hold on enteral feeds until small bowel healed and has bowel function"  Nutrition: prealbumin 2/2 11.5 reflects inflammation post bowel perf , TPN not at goal rate due to volume restrictions Endo:  On Lantus 20 units BID and q4h SSI.  CBGs 105 - 135. Well controlled with lantus Lytes: No electrolytes in current TPN,  K 3.3 today after 3 runs of K yesterday.  Phos drawn 2/5 coming down to 5.2, mag 1.8 on 2/5.  Dr. Elsworth Soho to order K runs Renal: newly HD-dependent; on HD at [facility], only got 1 hr HD 2/2 due to tachycardia and decreased BP. Creat 3.16, BUN 108; UOP 1.1 ml/kg/hr. Plan is no HD today, may get CRRT while in ICU if needed Hepatobil: LFTs ok; trig 291  care, PPI IV TPN Access: CVC   Plan:   1. Continue Clinimix 5/15 (no electrolytes) at 80 ml/hr + 20% lipids at 10 ml/hr.  This will provide 96 gm of protein and 1843 kcals.  This is 80% of protein and 87% kcal goals.  Goal rate of 100  m/hr would provide full support (120 gm protein and 2184 kcals)  but do not want to increase rate due to volume concerns.  2. Continue electrolyte-free formula until renal advised to change to formula with standard lytes. 3. No insulin in TPN. Continue lantus 20 units BID today 4. Needs K Runs, d/w Dr. Elsworth Soho, he will order K runs today   Eudelia Bunch, Pharm.D. [phone] 08/23/2013 9:00 AM

## 2013-08-24 LAB — GLUCOSE, CAPILLARY
GLUCOSE-CAPILLARY: 116 mg/dL — AB (ref 70–99)
GLUCOSE-CAPILLARY: 91 mg/dL (ref 70–99)
Glucose-Capillary: 111 mg/dL — ABNORMAL HIGH (ref 70–99)
Glucose-Capillary: 129 mg/dL — ABNORMAL HIGH (ref 70–99)
Glucose-Capillary: 110 mg/dL — ABNORMAL HIGH (ref 70–99)
Glucose-Capillary: 139 mg/dL — ABNORMAL HIGH (ref 70–99)

## 2013-08-24 LAB — BASIC METABOLIC PANEL
BUN: 107 mg/dL — ABNORMAL HIGH (ref 6–23)
CO2: 15 mEq/L — ABNORMAL LOW (ref 19–32)
Calcium: 7.1 mg/dL — ABNORMAL LOW (ref 8.4–10.5)
Chloride: 108 mEq/L (ref 96–112)
Creatinine, Ser: 3.19 mg/dL — ABNORMAL HIGH (ref 0.50–1.35)
GFR calc Af Amer: 21 mL/min — ABNORMAL LOW (ref >90)
GFR, EST NON AFRICAN AMERICAN: 18 mL/min — AB (ref >90)
Glucose, Bld: 113 mg/dL — ABNORMAL HIGH (ref 70–99)
Potassium: 3.4 mEq/L — ABNORMAL LOW (ref 3.7–5.3)
SODIUM: 139 meq/L (ref 137–147)

## 2013-08-24 LAB — CBC
HCT: 21.1 % — ABNORMAL LOW (ref 39.0–52.0)
Hemoglobin: 7.4 g/dL — ABNORMAL LOW (ref 13.0–17.0)
MCH: 30.7 pg (ref 26.0–34.0)
MCHC: 35.1 g/dL (ref 30.0–36.0)
MCV: 87.6 fL (ref 78.0–100.0)
PLATELETS: 215 10*3/uL (ref 150–400)
RBC: 2.41 MIL/uL — ABNORMAL LOW (ref 4.22–5.81)
RDW: 14.8 % (ref 11.5–15.5)
WBC: 21.4 10*3/uL — ABNORMAL HIGH (ref 4.0–10.5)

## 2013-08-24 MED ORDER — HEPARIN SODIUM (PORCINE) 5000 UNIT/ML IJ SOLN
5000.0000 [IU] | Freq: Three times a day (TID) | INTRAMUSCULAR | Status: DC
Start: 2013-08-24 — End: 2013-08-26
  Administered 2013-08-24 – 2013-08-25 (×5): 5000 [IU] via SUBCUTANEOUS
  Filled 2013-08-24 (×6): qty 1

## 2013-08-24 MED ORDER — HEPARIN SODIUM (PORCINE) 1000 UNIT/ML IJ SOLN
5000.0000 [IU] | Freq: Three times a day (TID) | INTRAMUSCULAR | Status: DC
  Filled 2013-08-24 (×3): qty 5

## 2013-08-24 MED ORDER — TRACE MINERALS CR-CU-F-FE-I-MN-MO-SE-ZN IV SOLN
INTRAVENOUS | Status: AC
  Administered 2013-08-24: 17:00:00 via INTRAVENOUS
  Filled 2013-08-24: qty 2000

## 2013-08-24 MED ORDER — FAT EMULSION 20 % IV EMUL
250.0000 mL | INTRAVENOUS | Status: AC
  Administered 2013-08-24: 250 mL via INTRAVENOUS
  Filled 2013-08-24: qty 250

## 2013-08-24 MED ORDER — POTASSIUM CHLORIDE 10 MEQ/50ML IV SOLN
10.0000 meq | INTRAVENOUS | Status: AC
  Administered 2013-08-24 (×3): 10 meq via INTRAVENOUS
  Filled 2013-08-24: qty 50

## 2013-08-24 NOTE — Progress Notes (Signed)
Attempted to weigh patient in bed. Bed scale weight states "6.0kg." Unable to obtain accurate weight at this time. Richarda Blade RN

## 2013-08-24 NOTE — Progress Notes (Signed)
[facility] ADULT ICU REPLACEMENT PROTOCOL FOR AM LAB REPLACEMENT ONLY  The patient does not apply for the [facility] Adult ICU Electrolyte Replacment Protocol based on the criteria listed below:   1. Is GFR >/= 40 ml/min? no  Patient's GFR today is  2. Is urine output >/= 0.5 ml/kg/hr for the last 6 hours? no Patient's UOP is  ml/kg/hr 3. Is BUN < 60 mg/dL? no  Patient's BUN today is 107 4. Abnormal electrolyte(s): K 3.45. Ordered repletion with: NA 6. If Harmon panic level lab has been reported, has the CCM MD in charge been notified? yes.   Physician:  Dr Nelda Marseille. He will defer to rounding MD  [facility], Jason Harmon 08/24/2013 5:58 AM

## 2013-08-24 NOTE — Progress Notes (Signed)
35% ATC

## 2013-08-24 NOTE — Progress Notes (Signed)
Patient ID: Jason Harmon, male   DOB: Jul 03, 1941, 73 y.o.   MRN: [id number] 4 Days Post-Op  Subjective: Pt had a BM out of bottom this am.  Otherwise has been stable per RN  Objective: Vital signs in last 24 hours: Temp:  [97.2 F (36.2 C)-98.9 F (37.2 C)] 97.2 F (36.2 C) (02/07 0751) Pulse Rate:  [96-113] 108 (02/07 0826) Resp:  [6-33] 28 (02/07 0826) BP: (130-169)/(70-99) 169/83 mmHg (02/07 0826) SpO2:  [98 %-100 %] 100 % (02/07 0826) Arterial Line BP: (177-206)/(68-96) 196/78 mmHg (02/06 1400) FiO2 (%):  [40 %] 40 % (02/07 0826) Last BM Date: 08/23/13 (small amount of thick stool per rectum)  Intake/Output from previous day: 02/06 0701 - 02/07 0700 In: 2900 [I.V.:440; IV Piggyback:300; TPN:2160] Out: 9892 [Urine:2995; Drains:475; Stool:625] Intake/Output this shift: Total I/O In: 90 [TPN:90] Out: 300 [Urine:200; Stool:100]  PE: Abd: soft, diffuse anasarca, absent BS, but bilious output in colostomy bag.  Midline wound is clean with some hypergranulation tissue in the upper part of his wound.  g-tube in place with 475cc/24hr yesterday  Lab Results:   Recent Labs  08/22/13 0400 08/22/13 1500 08/23/13 0350  WBC 29.9*  --  26.7*  HGB 6.4* 7.6* 7.4*  HCT 18.4* 21.6* 21.1*  PLT 205  --  212   BMET  Recent Labs  08/23/13 0350 08/24/13 0500  NA 138 139  K 3.3* 3.4*  CL 105 108  CO2 15* 15*  GLUCOSE 142* 113*  BUN 108* 107*  CREATININE 3.16* 3.19*  CALCIUM 6.8* 7.1*   PT/INR  Recent Labs  08/22/13 1100  LABPROT 16.4*  INR 1.36   CMP     Component Value Date/Time   NA 139 08/24/2013 0500   K 3.4* 08/24/2013 0500   CL 108 08/24/2013 0500   CO2 15* 08/24/2013 0500   GLUCOSE 113* 08/24/2013 0500   BUN 107* 08/24/2013 0500   CREATININE 3.19* 08/24/2013 0500   CALCIUM 7.1* 08/24/2013 0500   PROT 4.1* 08/22/2013 0400   ALBUMIN 1.3* 08/22/2013 0400   AST 20 08/22/2013 0400   ALT 13 08/22/2013 0400   ALKPHOS 71 08/22/2013 0400   BILITOT 1.0 08/22/2013 0400   GFRNONAA 18*  08/24/2013 0500   GFRAA 21* 08/24/2013 0500   Lipase  No results found for this basename: lipase       Studies/Results: Dg Chest Port 1 View  08/23/2013   CLINICAL DATA:  Followup pleural effusions.  EXAM: PORTABLE CHEST - 1 VIEW  COMPARISON:  08/22/2013.  FINDINGS: Tracheostomy, left IJ central line and right IJ vas cath remain present. There is left-greater-than-right perihilar and basilar airspace opacity, most compatible with pulmonary edema/ volume overload. Left costophrenic angle is excluded from view. No focal consolidation is identified.  IMPRESSION: No interval change in the chest. Stable support apparatus and findings compatible with mild volume overload.   Electronically Signed   By: Dereck Ligas M.D.   On: 08/23/2013 07:55    Anti-infectives: Anti-infectives   Start     Dose/Rate Route Frequency Ordered Stop   08/18/13 1400  piperacillin-tazobactam (ZOSYN) IVPB 2.25 g     2.25 g 100 mL/hr over 30 Minutes Intravenous 3 times per day 08/18/13 1124     08/18/13 0500  piperacillin-tazobactam (ZOSYN) IVPB 3.375 g  Status:  Discontinued     3.375 g 12.5 mL/hr over 240 Minutes Intravenous 3 times per day 08/18/13 0434 08/18/13 1124       Assessment/Plan 1.Pod 6/4 reexploration with small  bowel anastmosis, colostomy and g tube  2. VDRF 3. anemia secondary to melena, improving 4. PCM/TNA  Plan: 1.  Patient looks pretty good. Cont TNA.  Still no bowel activity as far as bowel sounds.  He does have some bilious output from his colostomy.  Hold off on TFs enterally for now. 2. Check hgb today.  If stable, can resume lovenox.  No other signs of melena since Wednesday. 3. Cont dressing changes and routine ostomy care. 4. Cont abx therapy       LOS: 6 days    Linnie Mcglocklin E 08/24/2013, 8:34 AM Pager: [phone]

## 2013-08-24 NOTE — Progress Notes (Signed)
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    Subjective:  Stable Renal Function Great UOP No new events Not takign PO  02/06 0701 - 02/07 0700 In: 2900 [I.V.:440; IV Piggyback:300; TPN:2160] Out: 1478 [Urine:2995; Drains:475; Stool:625]  Filed Weights   08/20/13 0500 08/21/13 0500 08/22/13 0500  Weight: 88.1 kg (194 lb 3.6 oz) 87.7 kg (193 lb 5.5 oz) 87.9 kg (193 lb 12.6 oz)    Current meds: reviewed Current Labs: reviewed   Physical Exam:  Blood pressure 156/84, pulse 102, temperature 97.2 F (36.2 C), temperature source Oral, resp. rate 25, height 5\' 11"  (1.803 m), weight 87.9 kg (193 lb 12.6 oz), SpO2 100.00%. GEN: chronically ill appaering, Trach, more awake and alert ENT: Trach and R IJ nontunneled HD Catheter noted  EYES: EOMI. Eyes open to noise, stimullus  CV: tachy, regular  PULM: +Bs b/l  ABD: wound vac at midline  SKIN: large ventral abd incision  EXT: 2+ pitting edema b/l  Assessment/Plan 1. Dialysis Dependent AKI, progressing to ESRD: Appears at this time to be liberated from RRT with sufficient but low GFR.  Cont to follow closely. OK with judicious diuretics to assist with weaning.  Will have nontunneled HD catheter removed if stable tomorrow.  2. Anemia: transfusions per CCM and surgery.  Not currently on ESA 3. Septic shock and SB performation: per surgery and CCM.  Improving.   4. Mild metabolic acidosis: follow for now.  If PO started can supplement with NaHCO3  Pearson Grippe MD 08/24/2013, 8:06 AM   Recent Labs Lab 08/20/13 0430  08/21/13 0355 08/22/13 0400 08/23/13 0350 08/24/13 0500  NA 137  < > 137 138 138 139  K 3.4*  < > 3.6* 3.3* 3.3* 3.4*  CL 100  < > 102 103 105 108  CO2 21  < > 17* 16* 15* 15*  GLUCOSE 128*  < > 137* 157* 142* 113*  BUN 95*  < > 100* 107* 108* 107*  CREATININE 2.95*  < > 2.98* 3.24*  3.16* 3.19*  CALCIUM 6.7*  < > 6.5* 6.4* 6.8* 7.1*  PHOS 5.9*  --  5.6* 5.2*  --   --   < > = values in this interval not displayed.  Recent Labs Lab 08/18/13 0404  08/21/13 0355 08/22/13 0400 08/22/13 1500 08/23/13 0350  WBC 61.2*  < > 40.2* 29.9*  --  26.7*  NEUTROABS 58.8*  --   --   --   --   --   HGB 11.1*  < > 8.3* 6.4* 7.6* 7.4*  HCT 32.2*  < > 23.2* 18.4* 21.6* 21.1*  MCV 88.2  < > 87.2 87.6  --  86.8  PLT 485*  < > 228 205  --  212  < > = values in this interval not displayed.

## 2013-08-24 NOTE — Progress Notes (Signed)
PARENTERAL NUTRITION CONSULT NOTE - FOLLOW UP  Pharmacy Consult for TPN Indication: perforated viscous  No Known Allergies  Patient Measurements: Height: 5\' 11"  (180.3 cm) (5'11") Weight:  (see progress note) IBW/kg (Calculated) : 75.3 Weight at [facility] 89.3 kg  Vital Signs: Temp: 97.2 F (36.2 C) (02/07 0751) Temp src: Oral (02/07 0751) BP: 169/83 mmHg (02/07 0826) Pulse Rate: 108 (02/07 0826) Intake/Output from previous day: 02/06 0701 - 02/07 0700 In: 2900 [I.V.:440; IV Piggyback:300; TPN:2160] Out: 1610 [Urine:2995; Drains:475; Stool:625] Intake/Output from this shift: Total I/O In: 90 [TPN:90] Out: 300 [Urine:200; Stool:100]  Labs:  Recent Labs  08/22/13 0400 08/22/13 1100 08/22/13 1500 08/23/13 0350  WBC 29.9*  --   --  26.7*  HGB 6.4*  --  7.6* 7.4*  HCT 18.4*  --  21.6* 21.1*  PLT 205  --   --  212  INR  --  1.36  --   --      Recent Labs  08/22/13 0400 08/23/13 0350 08/24/13 0500  NA 138 138 139  K 3.3* 3.3* 3.4*  CL 103 105 108  CO2 16* 15* 15*  GLUCOSE 157* 142* 113*  BUN 107* 108* 107*  CREATININE 3.24* 3.16* 3.19*  CALCIUM 6.4* 6.8* 7.1*  MG 1.8  --   --   PHOS 5.2*  --   --   PROT 4.1*  --   --   ALBUMIN 1.3*  --   --   AST 20  --   --   ALT 13  --   --   ALKPHOS 71  --   --   BILITOT 1.0  --   --    Estimated Creatinine Clearance: 22.9 ml/min (by C-G formula based on Cr of 3.19).    Recent Labs  08/23/13 1938 08/23/13 2358 08/24/13 0404  GLUCAP 99 91 111*    Medications:  Infusions:  . dextrose    . TPN (CLINIMIX) Adult without lytes 80 mL/hr at 08/23/13 1740   And  . fat emulsion 250 mL (08/23/13 1740)  . lactated ringers      Insulin Requirements in the past 24 hours:  lantus 20 units BID, 6 units SSI,   Current Nutrition:  At [facility], was on Clinimix E 5/20 at 65 ml/hr. 2/1 changed to Clinimix 5/15 at 75 ml/hr + lipids at 10 mlg/hr 2/4 Clinimix 5/15 at 80 ml/hr + lipids at 10 ml/hr  Nutritional Goals: per RD  note 2/2 2130 kCal, 120-135 grams of protein per day  Admit: 73 year old male with SBO and ischemic bowel s/p resection at [facility].  Was transferred to [facility] for continuing hemodialysis and inability to wean from ventilator.  On Sunday 2/1 developed a new bowel perforation at his anastomosis site and is s/p ex lap with small bowel resection and wound vac placement. 2/2 did not tolerate HD, got 1 hr 2/3: OR small bowel anastomosis, colostomy, gastrostomy tube, blood transfusion pre op for Hg 5.9 2/4: POD 3/1  reexploration with small bowel anastmosis, colostomy g tube, intubated; no HD planned for today 2/5: POD 4/2 - Holding off on HD 2/6: POD 5/3, ostomy w/ liquid feculent output, bowels not ready for enteral feeds yet 2/7: POD 6/4, holding off on enteral feeds due to no bowel sounds.  GI: new bowel perforation s/p ex lap; was on TPN at [facility].  To OR 2/3  for small bowel anastomosis, colostomy, gastrostomy tube. Per CCS " He is high risk still for abdominal issues,  continue abx for peritonitis, g tube to drainage, hold on enteral feeds until small bowel healed and has bowel function"  Nutrition: prealbumin 2/2 11.5 reflects inflammation post bowel perf , TPN not at goal rate due to volume restrictions Endo:  On Lantus 20 units BID and q4h SSI.  CBGs 91-111. Well controlled with lantus Lytes: No electrolytes in current TPN,  K 3.4 today after 3 runs of K yesterday.  Phos drawn 2/5 coming down to 5.2, mag 1.8 on 2/5.   Renal: newly HD-dependent; on HD at [facility], only got 1 hr HD 2/2 due to tachycardia and decreased BP. Creat 3.19, BUN 113; UOP 1.4 ml/kg/hr. Renal not planning dialysis as kidney function adequate.  Hepatobil: LFTs ok; trig 291  care, PPI IV TPN Access: CVC   Plan:   1. Continue Clinimix 5/15 (no electrolytes) at 80 ml/hr + 20% lipids at 10 ml/hr.  This will provide 96 gm of protein and 1843 kcals.  This is 80% of protein and 87% kcal goals.  Goal rate of 100 m/hr would provide  full support (120 gm protein and 2184 kcals)  but do not want to increase rate due to volume concerns.  2. Continue electrolyte-free formula until renal advises to change to formula with standard lytes. 3. No insulin in TPN. Continue lantus 20 units BID today  Heide Guile, PharmD, [facility] Clinical Pharmacist Pager [phone]   08/24/2013 9:00 AM

## 2013-08-24 NOTE — Progress Notes (Signed)
PULMONARY  / CRITICAL CARE MEDICINE HISTORY AND PHYSICAL EXAMINATION  Name: Jason Harmon MRN: [id number] DOB: 1941/06/30    ADMISSION DATE:  08/18/2013  CHIEF COMPLAINT:  Bowel perforation  BRIEF PATIENT DESCRIPTION:  73 yo male with SBO/ischemic bowel s/p resection with subsequent complicated medical course including respiratory failure and inability to wean from ventilator,admitted from [facility] with bowel perforation at anastomosis site.   HISTORY OF PRESENT ILLNESS:  73 yo male with renal failure on dialysis and multiple recent surgeries for ischemic bowel presenting from [facility] with rigid abdomen and CT demonstrating bowel perforation. He has had a very complicated course over the past month when he initially presented to an OSH 1/9 with SBO/ischemic bowel requiring laparotomy and small bowel resection. His course was complicated by ARF, now on dialysis and respiratory failure resulting in tracheostomy and prolonged mechanical ventilation.  SIGNIFICANT EVENTS: 1/22 Tracheostomy 2/01 Transfer from [facility], to ER, to [facility] ICU.  CCS consulted 2/1 Exploratory laparotomy with segmental resection of small bowel anastomosis and sigmoid colon with placement of abdominal wound vac 2/02 Off pressors, did not tolerate HD 2/4 small bowel anastomosis with gastrostomy & End sigmoid colostomy   STUDIES:  CT A/P with contrast 08/17/2013 - bowel perforation, likely anastomotic breakdown at proximal enteroenterostomy with pneumoperitoneum and spilling of oral contrast, moderate ascites with peritonitis  LINES / TUBES: Tracheostomy 1/22>>> R IJ HD catheter 1/12>>>  CULTURES:  ANTIBIOTICS: Zosyn 2/1   SUBJECTIVE: Afebrile No obvious pain Good UO Weaned well on 10/5   PHYSICAL EXAM VITAL SIGNS: Temp:  [97.2 F (36.2 C)-98.9 F (37.2 C)] 97.2 F (36.2 C) (02/07 0751) Pulse Rate:  [96-113] 108 (02/07 0826) Resp:  [6-33] 28 (02/07 0826) BP: (130-169)/(70-99) 169/83 mmHg (02/07 0826) SpO2:   [98 %-100 %] 100 % (02/07 0826) Arterial Line BP: (177-202)/(68-83) 196/78 mmHg (02/06 1400) FiO2 (%):  [35 %-40 %] 35 % (02/07 0826)  VENTILATOR SETTINGS: Vent Mode:  [-] PCV FiO2 (%):  [35 %-40 %] 35 % Set Rate:  [20 bmp] 20 bmp PEEP:  [5 cmH20] 5 cmH20 Pressure Support:  [10 cmH20] 10 cmH20 Plateau Pressure:  [14 cmH20] 14 cmH20  INTAKE / OUTPUT: Intake/Output     02/06 0701 - 02/07 0700 02/07 0701 - 02/08 0700   I.V. (mL/kg) 440 (5)    Blood     IV Piggyback 300    TPN 2160 90   Total Intake(mL/kg) 2900 (33) 90 (1)   Urine (mL/kg/hr) 2995 (1.4) 200 (1.1)   Drains 475 (0.2)    Stool 625 (0.3) 100 (0.6)   Total Output 4095 300   Net -1195 -210        Stool Occurrence  1 x     PHYSICAL EXAMINATION: General: ill appearing, awake Neuro: awake,does not follow simple commands HEENT:  Trach site clean Cardiovascular:  Tachycardic, no murmurs/rubs/gallops Lungs:  Coarse breath sounds anteriorly Abdomen:  Distended, mild tenderness,ostomy Musculoskeletal:  2+ edema Skin:  No rash, ana sarca+  LABS:  CBC Recent Labs     08/22/13  0400  08/22/13  1500  08/23/13  0350  WBC  29.9*   --   26.7*  HGB  6.4*  7.6*  7.4*  HCT  18.4*  21.6*  21.1*  PLT  205   --   212    Coag's Recent Labs     08/22/13  1100  INR  1.36    BMET Recent Labs     08/22/13  0400  08/23/13  0350  08/24/13  0500  NA  138  138  139  K  3.3*  3.3*  3.4*  CL  103  105  108  CO2  16*  15*  15*  BUN  107*  108*  107*  CREATININE  3.24*  3.16*  3.19*  GLUCOSE  157*  142*  113*    Electrolytes Recent Labs     08/22/13  0400  08/23/13  0350  08/24/13  0500  CALCIUM  6.4*  6.8*  7.1*  MG  1.8   --    --   PHOS  5.2*   --    --     Sepsis Markers No results found for this basename: LACTICACIDVEN, PROCALCITON, O2SATVEN,  in the last 72 hours  ABG No results found for this basename: PHART, PCO2ART, PO2ART,  in the last 72 hours  Liver Enzymes Recent Labs     08/22/13  0400   AST  20  ALT  13  ALKPHOS  71  BILITOT  1.0  ALBUMIN  1.3*   Glucose Recent Labs     08/23/13  0738  08/23/13  1213  08/23/13  1548  08/23/13  1938  08/23/13  2358  08/24/13  0404  GLUCAP  130*  124*  95  99  91  111*    Imaging Dg Chest Port 1 View  08/23/2013   CLINICAL DATA:  Followup pleural effusions.  EXAM: PORTABLE CHEST - 1 VIEW  COMPARISON:  08/22/2013.  FINDINGS: Tracheostomy, left IJ central line and right IJ vas cath remain present. There is left-greater-than-right perihilar and basilar airspace opacity, most compatible with pulmonary edema/ volume overload. Left costophrenic angle is excluded from view. No focal consolidation is identified.  IMPRESSION: No interval change in the chest. Stable support apparatus and findings compatible with mild volume overload.   Electronically Signed   By: Dereck Ligas M.D.   On: 08/23/2013 07:55    ASSESSMENT / PLAN:  PULMONARY A: Acute respiratory failure in setting of perforated bowel.  S/p Tracheostomy 1/22. P: -  SBTs as tolerated  - can go to ATC -prn BD's  CARDIOVASCULAR A: Septic shock from perforated bowel >> off pressors 2/02. Hx of A fib >> previously on amiodarone. P: -dc IV fluids  RENAL A: Acute kidney injury 2nd to ischemic bowel, shock >> started on HD January 2015. Ana sarca P: -ct neg balance    GASTROINTESTINAL A: Perforated bowel. Protein calorie malnutrition. P: -continue TNA, defer timing of Tfs to surgery -protonix for SUP  HEMATOLOGIC A: Anemia of critical illness vs acute blood loss - no obvious source S/p 3u PRBC so far P: -SCD for DVT prevention >> hold off SQ heparin x 24h per surgery  INFECTIOUS A: Septic shock from bowel perforation. P: -continue zosyn x 7ds total  ENDOCRINE A: Hyperglycemia. P: -SSI  NEUROLOGIC A: Acute encephalopathy 2nd to sepsis. P: -int fent while on vent  Updated family at bedside 2/3.  Explained long term prognosis guarded  Care  during the described time interval was provided by me and/or other providers on the critical care team.  I have reviewed this patient's available data, including medical history, events of note, physical examination and test results as part of my evaluation  CC time x 43m  ALVA,RAKESH V. [phone]  08/24/2013, 9:04 AM

## 2013-08-25 LAB — GLUCOSE, CAPILLARY
GLUCOSE-CAPILLARY: 99 mg/dL (ref 70–99)
Glucose-Capillary: 80 mg/dL (ref 70–99)
Glucose-Capillary: 94 mg/dL (ref 70–99)
Glucose-Capillary: 92 mg/dL (ref 70–99)
Glucose-Capillary: 92 mg/dL (ref 70–99)

## 2013-08-25 LAB — CBC
HEMATOCRIT: 21.1 % — AB (ref 39.0–52.0)
HEMOGLOBIN: 7.4 g/dL — AB (ref 13.0–17.0)
MCH: 30.8 pg (ref 26.0–34.0)
MCHC: 35.1 g/dL (ref 30.0–36.0)
MCV: 87.9 fL (ref 78.0–100.0)
Platelets: 218 10*3/uL (ref 150–400)
RBC: 2.4 MIL/uL — ABNORMAL LOW (ref 4.22–5.81)
RDW: 14.8 % (ref 11.5–15.5)
WBC: 18.8 10*3/uL — ABNORMAL HIGH (ref 4.0–10.5)

## 2013-08-25 LAB — BASIC METABOLIC PANEL
BUN: 109 mg/dL — AB (ref 6–23)
CALCIUM: 7.3 mg/dL — AB (ref 8.4–10.5)
CO2: 14 meq/L — AB (ref 19–32)
Chloride: 108 mEq/L (ref 96–112)
Creatinine, Ser: 3.15 mg/dL — ABNORMAL HIGH (ref 0.50–1.35)
GFR calc Af Amer: 21 mL/min — ABNORMAL LOW (ref >90)
GFR calc non Af Amer: 19 mL/min — ABNORMAL LOW (ref >90)
GLUCOSE: 110 mg/dL — AB (ref 70–99)
Potassium: 3.3 mEq/L — ABNORMAL LOW (ref 3.7–5.3)
Sodium: 140 mEq/L (ref 137–147)

## 2013-08-25 MED ORDER — POTASSIUM CHLORIDE 10 MEQ/50ML IV SOLN
10.0000 meq | INTRAVENOUS | Status: AC
  Administered 2013-08-25 (×2): 10 meq via INTRAVENOUS
  Filled 2013-08-25: qty 50

## 2013-08-25 MED ORDER — TRACE MINERALS CR-CU-F-FE-I-MN-MO-SE-ZN IV SOLN
INTRAVENOUS | Status: AC
  Administered 2013-08-25: 18:00:00 via INTRAVENOUS
  Filled 2013-08-25: qty 2000

## 2013-08-25 MED ORDER — FAT EMULSION 20 % IV EMUL
250.0000 mL | INTRAVENOUS | Status: AC
  Administered 2013-08-25: 250 mL via INTRAVENOUS
  Filled 2013-08-25: qty 250

## 2013-08-25 NOTE — Progress Notes (Signed)
Patient ID: Jason Harmon, male   DOB: Apr 09, 1941, 73 y.o.   MRN: [id number] 5 Days Post-Op  Subjective: Pt sleeping in his chair.  No c/o  Objective: Vital signs in last 24 hours: Temp:  [97.4 F (36.3 C)-98.4 F (36.9 C)] 97.4 F (36.3 C) (02/08 0802) Pulse Rate:  [96-114] 111 (02/08 0738) Resp:  [22-31] 28 (02/08 0738) BP: (143-168)/(71-95) 168/90 mmHg (02/08 0700) SpO2:  [97 %-100 %] 100 % (02/08 0738) FiO2 (%):  [28 %] 28 % (02/08 0422) Last BM Date: 08/24/13  Intake/Output from previous day: 02/07 0701 - 02/08 0700 In: 2280 [IV Piggyback:300; TPN:1980] Out: 3875 [Urine:3100; Drains:225; Stool:550] Intake/Output this shift:    PE: Abd: soft, good BS today, ostomy with bilious output, g-tube output decreasing, incision stable and clean.  Lab Results:   Recent Labs  08/23/13 0350 08/24/13 0900  WBC 26.7* 21.4*  HGB 7.4* 7.4*  HCT 21.1* 21.1*  PLT 212 215   BMET  Recent Labs  08/24/13 0500 08/25/13 0415  NA 139 140  K 3.4* 3.3*  CL 108 108  CO2 15* 14*  GLUCOSE 113* 110*  BUN 107* 109*  CREATININE 3.19* 3.15*  CALCIUM 7.1* 7.3*   PT/INR  Recent Labs  08/22/13 1100  LABPROT 16.4*  INR 1.36   CMP     Component Value Date/Time   NA 140 08/25/2013 0415   K 3.3* 08/25/2013 0415   CL 108 08/25/2013 0415   CO2 14* 08/25/2013 0415   GLUCOSE 110* 08/25/2013 0415   BUN 109* 08/25/2013 0415   CREATININE 3.15* 08/25/2013 0415   CALCIUM 7.3* 08/25/2013 0415   PROT 4.1* 08/22/2013 0400   ALBUMIN 1.3* 08/22/2013 0400   AST 20 08/22/2013 0400   ALT 13 08/22/2013 0400   ALKPHOS 71 08/22/2013 0400   BILITOT 1.0 08/22/2013 0400   GFRNONAA 19* 08/25/2013 0415   GFRAA 21* 08/25/2013 0415   Lipase  No results found for this basename: lipase       Studies/Results: No results found.  Anti-infectives: Anti-infectives   Start     Dose/Rate Route Frequency Ordered Stop   08/18/13 1400  piperacillin-tazobactam (ZOSYN) IVPB 2.25 g     2.25 g 100 mL/hr over 30 Minutes  Intravenous 3 times per day 08/18/13 1124     08/18/13 0500  piperacillin-tazobactam (ZOSYN) IVPB 3.375 g  Status:  Discontinued     3.375 g 12.5 mL/hr over 240 Minutes Intravenous 3 times per day 08/18/13 0434 08/18/13 1124       Assessment/Plan  1.Pod 7/5 reexploration with small bowel anastmosis, colostomy and g tube  2. VDRF  3. anemia secondary to melena, improving  4. PCM/TNA  Plan: 1. Will clamp g-tube today.  If he tolerates this then hopefully we can start trickle tube feeds tomorrow through his g-tube. 2. Cont dressing care and routine ostomy care 3. Heparin started again yesterday.  Cbc pending.  No evidence of bleeding 4. Cont TNA until we can get patient on enteral feeds    LOS: 7 days    [name] E 08/25/2013, 8:44 AM Pager: [phone]

## 2013-08-25 NOTE — Progress Notes (Signed)
Admit: 08/18/2013 LOS: 79  63M with dialysis dependent prolonged AKI, with chronic ventilatory failure, small bowel perforation (repaired 08/20/13), abdominal septic shock.    Subjective:  Stable Renal Function Good UOP No new events Not takign PO or per Gtube On TPN Back on heparin gtt  02/07 0701 - 02/08 0700 In: 2280 [IV Piggyback:300; TPN:1980] Out: 6834 [Urine:3100; Drains:225; Stool:550]  Filed Weights   08/20/13 0500 08/21/13 0500 08/22/13 0500  Weight: 88.1 kg (194 lb 3.6 oz) 87.7 kg (193 lb 5.5 oz) 87.9 kg (193 lb 12.6 oz)    Current meds: reviewed Current Labs: reviewed   Physical Exam:  Blood pressure 168/90, pulse 111, temperature 97.4 F (36.3 C), temperature source Axillary, resp. rate 28, height 5\' 11"  (1.803 m), weight 87.9 kg (193 lb 12.6 oz), SpO2 100.00%. GEN: chronically ill appaering, Trach, more awake and alert ENT: Trach and R IJ nontunneled HD Catheter noted  EYES: EOMI. Eyes open to noise, stimullus  CV: tachy, regular  PULM: +Bs b/l  ABD: wound vac at midline  SKIN: large ventral abd incision  EXT: 2+ pitting edema b/l  Assessment/Plan 1. Prolonged Dialysis Dependent AKI: Appears at this time to be liberated from RRT with sufficient but low GFR and adequate UOP.  No HD since 2/2 (which was abandoned early 2/2 hemodynamin instability).  Cont to follow closely. OK with judicious diuretics to assist with weaning.  Will have HD Cathter removed. 2. Anemia: transfusions per CCM and surgery.  Not currently on ESA 3. Septic shock and SB performation: per surgery and CCM.  Improving.  Off pressors 4. Mild metabolic acidosis: Some discussion of using enteral route in next 24h and would hadd NaHCO3 if so.  If not can consider NaHCO3 gtt vs LR gtt.      Pearson Grippe MD 08/25/2013, 9:13 AM   Recent Labs Lab 08/20/13 0430  08/21/13 0355 08/22/13 0400 08/23/13 0350 08/24/13 0500 08/25/13 0415  NA 137  < > 137 138 138 139 140  K 3.4*  < > 3.6* 3.3* 3.3* 3.4*  3.3*  CL 100  < > 102 103 105 108 108  CO2 21  < > 17* 16* 15* 15* 14*  GLUCOSE 128*  < > 137* 157* 142* 113* 110*  BUN 95*  < > 100* 107* 108* 107* 109*  CREATININE 2.95*  < > 2.98* 3.24* 3.16* 3.19* 3.15*  CALCIUM 6.7*  < > 6.5* 6.4* 6.8* 7.1* 7.3*  PHOS 5.9*  --  5.6* 5.2*  --   --   --   < > = values in this interval not displayed.  Recent Labs Lab 08/22/13 0400 08/22/13 1500 08/23/13 0350 08/24/13 0900  WBC 29.9*  --  26.7* 21.4*  HGB 6.4* 7.6* 7.4* 7.4*  HCT 18.4* 21.6* 21.1* 21.1*  MCV 87.6  --  86.8 87.6  PLT 205  --  212 215

## 2013-08-25 NOTE — Progress Notes (Signed)
eLink Physician-Brief Progress Note Patient Name: Jason Harmon DOB: [birth year] MRN: [id number]  Date of Service  08/25/2013   HPI/Events of Note  Hypokalemia in the setting of advanced renal insufficiency.   eICU Interventions  Potassium replaced with 30 mEq   Intervention Category Intermediate Interventions: Electrolyte abnormality - evaluation and management  DETERDING,ELIZABETH 08/25/2013, 5:17 AM

## 2013-08-25 NOTE — Progress Notes (Signed)
PARENTERAL NUTRITION CONSULT NOTE - FOLLOW UP  Pharmacy Consult for TPN Indication: perforated viscous  No Known Allergies  Patient Measurements: Height: 5\' 11"  (180.3 cm) (5'11") Weight:  (see progress note) IBW/kg (Calculated) : 75.3 Weight at [facility] 89.3 kg  Vital Signs: Temp: 97.4 F (36.3 C) (02/08 0802) Temp src: Axillary (02/08 0802) BP: 162/94 mmHg (02/08 0925) Pulse Rate: 107 (02/08 0925) Intake/Output from previous day: 02/07 0701 - 02/08 0700 In: 2280 [IV Piggyback:300; TPN:1980] Out: 3875 [Urine:3100; Drains:225; Stool:550] Intake/Output from this shift:    Labs:  Recent Labs  08/22/13 1100 08/22/13 1500 08/23/13 0350 08/24/13 0900  WBC  --   --  26.7* 21.4*  HGB  --  7.6* 7.4* 7.4*  HCT  --  21.6* 21.1* 21.1*  PLT  --   --  212 215  INR 1.36  --   --   --      Recent Labs  08/23/13 0350 08/24/13 0500 08/25/13 0415  NA 138 139 140  K 3.3* 3.4* 3.3*  CL 105 108 108  CO2 15* 15* 14*  GLUCOSE 142* 113* 110*  BUN 108* 107* 109*  CREATININE 3.16* 3.19* 3.15*  CALCIUM 6.8* 7.1* 7.3*   Estimated Creatinine Clearance: 23.2 ml/min (by C-G formula based on Cr of 3.15).    Recent Labs  08/24/13 1249 08/24/13 1552 08/24/13 2016  GLUCAP 129* 110* 139*    Medications:  Infusions:  . dextrose    . TPN (CLINIMIX) Adult without lytes 80 mL/hr at 08/24/13 1722   And  . fat emulsion 250 mL (08/24/13 1722)  . lactated ringers      Insulin Requirements in the past 24 hours:  lantus 20 units BID, 6 units SSI,   Current Nutrition:  At [facility], was on Clinimix E 5/20 at 65 ml/hr. 2/1 changed to Clinimix 5/15 at 75 ml/hr + lipids at 10 mlg/hr 2/4 Clinimix 5/15 at 80 ml/hr + lipids at 10 ml/hr  Nutritional Goals: per RD note 2/2 2130 kCal, 120-135 grams of protein per day  Admit: 73 year old male with SBO and ischemic bowel s/p resection at [facility].  Was transferred to [facility] for continuing hemodialysis and inability to wean from ventilator.  On  Sunday 2/1 developed a new bowel perforation at his anastomosis site and is s/p ex lap with small bowel resection and wound vac placement. 2/2 did not tolerate HD, got 1 hr 2/3: OR small bowel anastomosis, colostomy, gastrostomy tube, blood transfusion pre op for Hg 5.9 2/4: POD 3/1  reexploration with small bowel anastmosis, colostomy g tube, intubated; no HD planned for today 2/5: POD 4/2 - Holding off on HD 2/6: POD 5/3, ostomy w/ liquid feculent output, bowels not ready for enteral feeds yet 2/7: POD 6/4, holding off on enteral feeds due to no bowel sounds. 2/8: POD 7/5 surgery clamping g-tube.  May be able to start tube feeds on 2/9.  GI: new bowel perforation s/p ex lap; was on TPN at [facility].  To OR 2/3  for small bowel anastomosis, colostomy, gastrostomy tube. Per CCS " He is high risk still for abdominal issues, continue abx for peritonitis, g tube to drainage, hold on enteral feeds until small bowel healed and has bowel function" Clamping g-tube on 2/8.  Nutrition: prealbumin 2/2 11.5 reflects inflammation post bowel perf , TPN not at goal rate due to volume restrictions Endo:  On Lantus 20 units BID and q4h SSI.  CBGs <150. Well controlled with lantus Lytes: No electrolytes  in current TPN,  K 3.3 today after 3 runs of K yesterday. K runs x 3 ordered this AM by MD. Phos drawn 2/5 coming down to 5.2, mag 1.8 on 2/5.   Renal: newly HD-dependent; on HD at [facility], only got 1 hr HD 2/2 due to tachycardia and decreased BP. Creat 3.15, BUN 110; UOP 1.5 ml/kg/hr. Renal not planning further dialysis as kidney function adequate.  Hepatobil: LFTs ok; trig 291   TPN Access: CVC   Plan:   1. Continue Clinimix 5/15 (no electrolytes) at 80 ml/hr + 20% lipids at 10 ml/hr.  This will provide 96 gm of protein and 1843 kcals.  This is 80% of protein and 87% kcal goals.  Goal rate of 100 m/hr would provide full support (120 gm protein and 2184 kcals)  but do not want to increase rate due to volume  concerns.  2. Continue electrolyte-free formula until renal advises to change to formula with standard lytes. 3. No insulin in TPN. Continue lantus 20 units BID today 4. TPN labs in AM  Heide Guile, PharmD, [facility] Clinical Pharmacist Pager [phone]   08/25/2013 9:44 AM

## 2013-08-25 NOTE — Progress Notes (Signed)
PULMONARY  / CRITICAL CARE MEDICINE HISTORY AND PHYSICAL EXAMINATION  Name: Jason Harmon MRN: [id number] DOB: 09/04/1940    ADMISSION DATE:  08/18/2013  CHIEF COMPLAINT:  Bowel perforation  BRIEF PATIENT DESCRIPTION:  73 yo male with SBO/ischemic bowel s/p resection with subsequent complicated medical course including respiratory failure and inability to wean from ventilator,admitted from [facility] with bowel perforation at anastomosis site.   HISTORY OF PRESENT ILLNESS:  73 yo male with renal failure on dialysis and multiple recent surgeries for ischemic bowel presenting from [facility] with rigid abdomen and CT demonstrating bowel perforation. He has had a very complicated course over the past month when he initially presented to an OSH 1/9 with SBO/ischemic bowel requiring laparotomy and small bowel resection. His course was complicated by ARF, now on dialysis and respiratory failure resulting in tracheostomy and prolonged mechanical ventilation.  SIGNIFICANT EVENTS: 1/22 Tracheostomy 2/01 Transfer from [facility], to ER, to [facility] ICU.  CCS consulted 2/1 Exploratory laparotomy with segmental resection of small bowel anastomosis and sigmoid colon with placement of abdominal wound vac 2/02 Off pressors, did not tolerate HD 2/4 small bowel anastomosis with gastrostomy & End sigmoid colostomy 2/8 ATC x 24h !   STUDIES:  CT A/P with contrast 08/17/2013 - bowel perforation, likely anastomotic breakdown at proximal enteroenterostomy with pneumoperitoneum and spilling of oral contrast, moderate ascites with peritonitis  LINES / TUBES: Tracheostomy 1/22>>> R IJ HD catheter 1/12>>>  CULTURES:  ANTIBIOTICS: Zosyn 2/1   SUBJECTIVE: Afebrile No obvious pain Good UO Tolerated ATC x 24h   PHYSICAL EXAM VITAL SIGNS: Temp:  [97.4 F (36.3 C)-98.4 F (36.9 C)] 97.4 F (36.3 C) (02/08 0802) Pulse Rate:  [96-114] 107 (02/08 0925) Resp:  [22-31] 28 (02/08 0925) BP: (143-168)/(71-95) 162/94 mmHg  (02/08 0925) SpO2:  [97 %-100 %] 100 % (02/08 0925) FiO2 (%):  [28 %] 28 % (02/08 0925)  VENTILATOR SETTINGS: Vent Mode:  [-]  FiO2 (%):  [28 %] 28 %  INTAKE / OUTPUT: Intake/Output     02/07 0701 - 02/08 0700 02/08 0701 - 02/09 0700   I.V. (mL/kg)     IV Piggyback 350    TPN 2160 180   Total Intake(mL/kg) 2510 (28.6) 180 (2)   Urine (mL/kg/hr) 3100 (1.5) 225 (0.6)   Drains 225 (0.1)    Stool 550 (0.3)    Total Output 3875 225   Net -1365 -45        Stool Occurrence 2 x      PHYSICAL EXAMINATION: General: ill appearing, awake Neuro: awake,does not follow simple commands HEENT:  Trach site clean Cardiovascular:  Tachycardic, no murmurs/rubs/gallops Lungs:  Coarse breath sounds anteriorly Abdomen:  Distended, mild tenderness,ostomy Musculoskeletal:  2+ edema Skin:  No rash, ana sarca+  LABS:  CBC Recent Labs     08/22/13  1500  08/23/13  0350  08/24/13  0900  WBC   --   26.7*  21.4*  HGB  7.6*  7.4*  7.4*  HCT  21.6*  21.1*  21.1*  PLT   --   212  215    Coag's No results found for this basename: APTT, INR,  in the last 72 hours  BMET Recent Labs     08/23/13  0350  08/24/13  0500  08/25/13  0415  NA  138  139  140  K  3.3*  3.4*  3.3*  CL  105  108  108  CO2  15*  15*  14*  BUN  108*  107*  109*  CREATININE  3.16*  3.19*  3.15*  GLUCOSE  142*  113*  110*    Electrolytes Recent Labs     08/23/13  0350  08/24/13  0500  08/25/13  0415  CALCIUM  6.8*  7.1*  7.3*    Sepsis Markers No results found for this basename: LACTICACIDVEN, PROCALCITON, O2SATVEN,  in the last 72 hours  ABG No results found for this basename: PHART, PCO2ART, PO2ART,  in the last 72 hours  Liver Enzymes No results found for this basename: AST, ALT, ALKPHOS, BILITOT, ALBUMIN,  in the last 72 hours Glucose Recent Labs     08/23/13  2358  08/24/13  0404  08/24/13  0749  08/24/13  1249  08/24/13  1552  08/24/13  2016  GLUCAP  91  111*  116*  129*  110*  139*     Imaging No results found.  ASSESSMENT / PLAN:  PULMONARY A: Acute respiratory failure in setting of perforated bowel.  S/p Tracheostomy 1/22. P: -  ATC as tolerated - I am amazed at his progress  -prn BD's  CARDIOVASCULAR A: Septic shock from perforated bowel >> off pressors 2/02. Hx of A fib >> previously on amiodarone. P: -dc IV fluids  RENAL A: Acute kidney injury 2nd to ischemic bowel, shock >> started on HD January 2015. Ana sarca P: -ct neg balance    GASTROINTESTINAL A: Perforated bowel. Protein calorie malnutrition. P: -continue TNA, defer timing of Tfs to surgery -protonix for SUP  HEMATOLOGIC A: Anemia of critical illness vs acute blood loss - no obvious source S/p 3u PRBC so far P: -SCD for DVT prevention >> hold off SQ heparin x 24h per surgery  INFECTIOUS A: Septic shock from bowel perforation. P: -continue zosyn x 7ds total, can stop 2/9  ENDOCRINE A: Hyperglycemia. P: -SSI  NEUROLOGIC A: Acute encephalopathy 2nd to sepsis. P: -int fent   Updated family at bedside 2/8.  Explained long term prognosis guarded but doing better tan expected for now. [facility] transfer back to [facility] once surgical issues resolved  Care during the described time interval was provided by me and/or other providers on the critical care team.  I have reviewed this patient's available data, including medical history, events of note, physical examination and test results as part of my evaluation  CC time x 61m  Azalyn Sliwa V. [phone]  08/25/2013, 11:01 AM

## 2013-08-25 NOTE — Progress Notes (Signed)
Clamp G-tube today - if no change in clinical status, we would feel more comfortable that his SB anastomosis is doing well.  Will consider starting tube feeds tomorrow.  Wound clean  Imogene Burn. Georgette Dover, MD, [facility] Surgery  General/ Trauma Surgery  08/25/2013 11:07 AM

## 2013-08-25 NOTE — Plan of Care (Signed)
Problem: Discharge Progression Outcomes Goal: Barriers To Progression Addressed/Resolved Outcome: Not Progressing Variance: Physical/mental limitations Pt starting to move all extremeties slightly and occasionally purposeful but not following commands.no attempts at verbal communication does not nod or attempt to mouthe words

## 2013-08-26 ENCOUNTER — Inpatient Hospital Stay (HOSPITAL_COMMUNITY): Payer: Medicare Other

## 2013-08-26 ENCOUNTER — Encounter (HOSPITAL_COMMUNITY): Payer: Self-pay | Admitting: General Surgery

## 2013-08-26 DIAGNOSIS — R042 Hemoptysis: Secondary | ICD-10-CM

## 2013-08-26 DIAGNOSIS — K55059 Acute (reversible) ischemia of intestine, part and extent unspecified: Secondary | ICD-10-CM

## 2013-08-26 DIAGNOSIS — I4891 Unspecified atrial fibrillation: Secondary | ICD-10-CM

## 2013-08-26 LAB — CBC
HCT: 18.6 % — ABNORMAL LOW (ref 39.0–52.0)
HEMATOCRIT: 18.2 % — AB (ref 39.0–52.0)
HEMOGLOBIN: 6.4 g/dL — AB (ref 13.0–17.0)
Hemoglobin: 6.6 g/dL — CL (ref 13.0–17.0)
MCH: 30.8 pg (ref 26.0–34.0)
MCH: 30.9 pg (ref 26.0–34.0)
MCHC: 35.5 g/dL (ref 30.0–36.0)
MCHC: 35.2 g/dL (ref 30.0–36.0)
MCV: 86.9 fL (ref 78.0–100.0)
MCV: 87.9 fL (ref 78.0–100.0)
PLATELETS: 199 10*3/uL (ref 150–400)
Platelets: 172 10*3/uL (ref 150–400)
RBC: 2.14 MIL/uL — ABNORMAL LOW (ref 4.22–5.81)
RBC: 2.07 MIL/uL — AB (ref 4.22–5.81)
RDW: 14.9 % (ref 11.5–15.5)
RDW: 14.7 % (ref 11.5–15.5)
WBC: 20.3 10*3/uL — ABNORMAL HIGH (ref 4.0–10.5)
WBC: 22.3 10*3/uL — ABNORMAL HIGH (ref 4.0–10.5)

## 2013-08-26 LAB — PREALBUMIN: PREALBUMIN: 13.2 mg/dL — AB (ref 17.0–34.0)

## 2013-08-26 LAB — GLUCOSE, CAPILLARY
GLUCOSE-CAPILLARY: 106 mg/dL — AB (ref 70–99)
GLUCOSE-CAPILLARY: 130 mg/dL — AB (ref 70–99)
Glucose-Capillary: 97 mg/dL (ref 70–99)
Glucose-Capillary: 112 mg/dL — ABNORMAL HIGH (ref 70–99)
Glucose-Capillary: 135 mg/dL — ABNORMAL HIGH (ref 70–99)
Glucose-Capillary: 87 mg/dL (ref 70–99)
Glucose-Capillary: 115 mg/dL — ABNORMAL HIGH (ref 70–99)

## 2013-08-26 LAB — APTT: aPTT: 37 seconds (ref 24–37)

## 2013-08-26 LAB — COMPREHENSIVE METABOLIC PANEL
ALT: 20 U/L (ref 0–53)
AST: 21 U/L (ref 0–37)
Albumin: 1.4 g/dL — ABNORMAL LOW (ref 3.5–5.2)
Alkaline Phosphatase: 101 U/L (ref 39–117)
BUN: 107 mg/dL — ABNORMAL HIGH (ref 6–23)
CO2: 12 mEq/L — ABNORMAL LOW (ref 19–32)
Calcium: 7.4 mg/dL — ABNORMAL LOW (ref 8.4–10.5)
Chloride: 114 mEq/L — ABNORMAL HIGH (ref 96–112)
Creatinine, Ser: 3.08 mg/dL — ABNORMAL HIGH (ref 0.50–1.35)
GFR calc Af Amer: 22 mL/min — ABNORMAL LOW (ref >90)
GFR calc non Af Amer: 19 mL/min — ABNORMAL LOW (ref >90)
Glucose, Bld: 73 mg/dL (ref 70–99)
Potassium: 3.2 mEq/L — ABNORMAL LOW (ref 3.7–5.3)
SODIUM: 144 meq/L (ref 137–147)
TOTAL PROTEIN: 4.8 g/dL — AB (ref 6.0–8.3)
Total Bilirubin: 1 mg/dL (ref 0.3–1.2)

## 2013-08-26 LAB — MAGNESIUM: Magnesium: 1.5 mg/dL (ref 1.5–2.5)

## 2013-08-26 LAB — PREPARE RBC (CROSSMATCH)

## 2013-08-26 LAB — TRIGLYCERIDES: TRIGLYCERIDES: 146 mg/dL (ref <150)

## 2013-08-26 LAB — PROTIME-INR
INR: 1.4 (ref 0.00–1.49)
Prothrombin Time: 16.8 seconds — ABNORMAL HIGH (ref 11.6–15.2)

## 2013-08-26 LAB — PHOSPHORUS: Phosphorus: 3.5 mg/dL (ref 2.3–4.6)

## 2013-08-26 MED ORDER — VANCOMYCIN HCL 10 G IV SOLR
1750.0000 mg | Freq: Once | INTRAVENOUS | Status: AC
  Administered 2013-08-26: 1750 mg via INTRAVENOUS
  Filled 2013-08-26: qty 1750

## 2013-08-26 MED ORDER — TRACE MINERALS CR-CU-F-FE-I-MN-MO-SE-ZN IV SOLN
INTRAVENOUS | Status: AC
  Administered 2013-08-26: 17:00:00 via INTRAVENOUS
  Filled 2013-08-26: qty 2000

## 2013-08-26 MED ORDER — MAGNESIUM SULFATE IN D5W 10-5 MG/ML-% IV SOLN
1.0000 g | Freq: Once | INTRAVENOUS | Status: AC
  Administered 2013-08-26: 1 g via INTRAVENOUS
  Filled 2013-08-26: qty 100

## 2013-08-26 MED ORDER — METOPROLOL TARTRATE 1 MG/ML IV SOLN
2.5000 mg | Freq: Four times a day (QID) | INTRAVENOUS | Status: DC
  Administered 2013-08-26 – 2013-08-27 (×4): 2.5 mg via INTRAVENOUS
  Filled 2013-08-26 (×8): qty 5

## 2013-08-26 MED ORDER — STERILE WATER FOR INJECTION IV SOLN
150.0000 meq | INTRAVENOUS | Status: DC
  Administered 2013-08-26 – 2013-08-28 (×4): 150 meq via INTRAVENOUS
  Filled 2013-08-26 (×9): qty 850

## 2013-08-26 MED ORDER — FAT EMULSION 20 % IV EMUL
240.0000 mL | INTRAVENOUS | Status: AC
  Administered 2013-08-26: 240 mL via INTRAVENOUS
  Filled 2013-08-26: qty 250

## 2013-08-26 MED ORDER — DARBEPOETIN ALFA-POLYSORBATE 100 MCG/0.5ML IJ SOLN
100.0000 ug | INTRAMUSCULAR | Status: DC
  Administered 2013-08-26 – 2013-09-02 (×2): 100 ug via SUBCUTANEOUS
  Filled 2013-08-26 (×3): qty 0.5

## 2013-08-26 MED ORDER — VITAL 1.5 CAL PO LIQD
1000.0000 mL | ORAL | Status: DC
  Administered 2013-08-26 – 2013-08-27 (×2): 1000 mL
  Filled 2013-08-26 (×2): qty 1000

## 2013-08-26 MED ORDER — POTASSIUM CHLORIDE 10 MEQ/50ML IV SOLN
10.0000 meq | INTRAVENOUS | Status: AC
  Administered 2013-08-26 (×4): 10 meq via INTRAVENOUS
  Filled 2013-08-26 (×2): qty 50

## 2013-08-26 MED ORDER — VITAL HIGH PROTEIN PO LIQD
1000.0000 mL | ORAL | Status: DC
  Administered 2013-08-26: 1000 mL
  Filled 2013-08-26 (×2): qty 1000

## 2013-08-26 MED ORDER — VANCOMYCIN HCL 10 G IV SOLR: 1250.0000 mg | INTRAVENOUS | Status: DC

## 2013-08-26 MED ORDER — PRO-STAT SUGAR FREE PO LIQD
30.0000 mL | Freq: Three times a day (TID) | ORAL | Status: DC
  Administered 2013-08-26 – 2013-09-04 (×26): 30 mL
  Filled 2013-08-26 (×31): qty 30

## 2013-08-26 NOTE — Progress Notes (Signed)
ANTIBIOTIC CONSULT NOTE  Pharmacy Consult for vancomycin Indication: PNA  No Known Allergies  Patient Measurements: Height: 5\' 11"  (180.3 cm) (5'11") Weight:  (86) IBW/kg (Calculated) : 75.3  Vital Signs: Temp: 99.2 F (37.3 C) (02/09 1646) Temp src: Oral (02/09 1646) BP: 153/91 mmHg (02/09 1611) Pulse Rate: 110 (02/09 1611) Intake/Output from previous day: 02/08 0701 - 02/09 0700 In: 2478.8 [I.V.:20; Blood:48.8; IV Piggyback:250; TPN:2160] Out: 1245 [Urine:3325; Stool:1000] Intake/Output from this shift: Total I/O In: 1938.8 [I.V.:573.8; Blood:325; NG/GT:80; IV Piggyback:150; TPN:810] Out: 1800 [Urine:1550; Stool:250]  Labs:  Recent Labs  08/24/13 0500 08/24/13 0900 08/25/13 0415 08/25/13 1314 08/26/13 0347 08/26/13 0359  WBC  --  21.4*  --  18.8*  --  20.3*  HGB  --  7.4*  --  7.4*  --  6.6*  PLT  --  215  --  218  --  199  CREATININE 3.19*  --  3.15*  --  3.08*  --    Estimated Creatinine Clearance: 23.8 ml/min (by C-G formula based on Cr of 3.08). No results found for this basename: VANCOTROUGH, VANCOPEAK, VANCORANDOM, GENTTROUGH, GENTPEAK, GENTRANDOM, TOBRATROUGH, TOBRAPEAK, TOBRARND, AMIKACINPEAK, AMIKACINTROU, AMIKACIN,  in the last 72 hours    Medications:  Scheduled:  . antiseptic oral rinse  15 mL Mouth Rinse QID  . chlorhexidine  15 mL Mouth Rinse BID  . darbepoetin (ARANESP) injection - NON-DIALYSIS  100 mcg Subcutaneous Q Mon-1800  . feeding supplement (PRO-STAT SUGAR FREE 64)  30 mL Per Tube TID  . insulin aspart  2-6 Units Subcutaneous Q4H  . insulin glargine  20 Units Subcutaneous BID  . metoprolol  2.5 mg Intravenous Q6H  . pantoprazole (PROTONIX) IV  40 mg Intravenous Q12H  . piperacillin-tazobactam (ZOSYN)  IV  2.25 g Intravenous Q8H     Assessment: 73 yo male on zosyn for peritonitis and to add vancomycin for possible PNA.  WBC= 20.9, afeb, SCr= 3.08, CrCl ~ 20. Patient was on HD recently (last 2/2) with some UOP currently (1.6  ml/kg/hr).  2/1 Zosyn>> 2/9 vanc>>  2/9 resp   Goal of Therapy:  Vancomycin trough level 15-20 mcg/ml  Plan:  -Vancomycin 1750mg  IV load followed by 1250mg  IV q48hr -Will follow renal function, cultures and clinical progress  Hildred Laser, Pharm D 08/26/2013 5:57 PM

## 2013-08-26 NOTE — Progress Notes (Signed)
Admit: 08/18/2013 LOS: 23  55M with dialysis dependent prolonged AKI, with chronic ventilatory failure, small bowel perforation (repaired 08/20/13), abdominal septic shock.    Subjective:  Stable Renal Function- great UOP- wife at bedside hgb in the 6's this AM- getting transfused Not takign PO or per Gtube On TPN Back on heparin gtt  02/08 0701 - 02/09 0700 In: 2410 [IV Piggyback:250; TPN:2160] Out: 7939 [Urine:3325; Stool:1000]  Filed Weights   08/22/13 0500 08/25/13 1300  Weight: 87.9 kg (193 lb 12.6 oz) 86 kg (189 lb 9.5 oz)    Current meds: reviewed Current Labs: reviewed   Physical Exam:  Blood pressure 155/90, pulse 122, temperature 98.7 F (37.1 C), temperature source Oral, resp. rate 27, height 5\' 11"  (1.803 m), weight 86 kg (189 lb 9.5 oz), SpO2 99.00%. GEN: chronically ill appaering, Trach, more awake and alert but no commands ENT: Trach - HD cath removed EYES: EOMI. Eyes open to noise, stimullus  CV: tachy, regular  PULM: +Bs b/l  ABD: wound vac at midline  SKIN: large ventral abd incision  EXT: 2+ pitting edema b/l  Assessment/Plan 1. Prolonged Dialysis Dependent AKI: Appears at this time to be liberated from RRT with sufficient but low GFR and adequate UOP.  No HD since 2/2 (which was abandoned early 2/2 hemodynamin instability).  Cont to follow closely. OK with judicious diuretics to assist with weaning.  Although at tis time appears to be autodiuresing.  2. Anemia: transfusions per CCM and surgery.  Not currently on ESA- will add 3. Septic shock and SB performation: per surgery and CCM.  Improving.  Off pressors Mild metabolic acidosis: Will add bicarb drip 5. Dispo- has only been a month since he was in "good health" according to wife.  Feel like she would do whatever it takes at this time.  Fortunately, dialysis not needed for now.  6. Hypokalemia- gentle repletion  Naveen Clardy A   08/26/2013, 8:35 AM   Recent Labs Lab 08/21/13 0355 08/22/13 0400   08/24/13 0500 08/25/13 0415 08/26/13 0347  NA 137 138  < > 139 140 144  K 3.6* 3.3*  < > 3.4* 3.3* 3.2*  CL 102 103  < > 108 108 114*  CO2 17* 16*  < > 15* 14* 12*  GLUCOSE 137* 157*  < > 113* 110* 73  BUN 100* 107*  < > 107* 109* 107*  CREATININE 2.98* 3.24*  < > 3.19* 3.15* 3.08*  CALCIUM 6.5* 6.4*  < > 7.1* 7.3* 7.4*  PHOS 5.6* 5.2*  --   --   --  3.5  < > = values in this interval not displayed.  Recent Labs Lab 08/24/13 0900 08/25/13 1314 08/26/13 0359  WBC 21.4* 18.8* 20.3*  HGB 7.4* 7.4* 6.6*  HCT 21.1* 21.1* 18.6*  MCV 87.6 87.9 86.9  PLT 215 218 199

## 2013-08-26 NOTE — Progress Notes (Addendum)
6 Days Post-Op  Subjective: On HTC  Objective: Vital signs in last 24 hours: Temp:  [98.3 F (36.8 C)-99.1 F (37.3 C)] 98.7 F (37.1 C) (02/09 0802) Pulse Rate:  [105-122] 122 (02/09 0806) Resp:  [15-30] 27 (02/09 0806) BP: (146-176)/(83-121) 155/90 mmHg (02/09 0806) SpO2:  [94 %-100 %] 99 % (02/09 0806) FiO2 (%):  [21 %-28 %] 28 % (02/09 0806) Weight:  [189 lb 9.5 oz (86 kg)] 189 lb 9.5 oz (86 kg) (02/08 1300) Last BM Date: 08/24/13  Intake/Output from previous day: 02/08 0701 - 02/09 0700 In: 2410 [IV Piggyback:250; TPN:2160] Out: 4325 [Urine:3325; Stool:1000] Intake/Output this shift:   On HTC, some bloody secretions Resp: few rhonchi Cardio: regular rate and rhythm GI: midline wound mostly clean, +BS, NT, colostomy pink with liquid output  Lab Results:   Recent Labs  08/25/13 1314 08/26/13 0359  WBC 18.8* 20.3*  HGB 7.4* 6.6*  HCT 21.1* 18.6*  PLT 218 199   BMET  Recent Labs  08/25/13 0415 08/26/13 0347  NA 140 144  K 3.3* 3.2*  CL 108 114*  CO2 14* 12*  GLUCOSE 110* 73  BUN 109* 107*  CREATININE 3.15* 3.08*  CALCIUM 7.3* 7.4*   PT/INR No results found for this basename: LABPROT, INR,  in the last 72 hours ABG No results found for this basename: PHART, PCO2, PO2, HCO3,  in the last 72 hours  Studies/Results: No results found.  Anti-infectives: Anti-infectives   Start     Dose/Rate Route Frequency Ordered Stop   08/18/13 1400  piperacillin-tazobactam (ZOSYN) IVPB 2.25 g     2.25 g 100 mL/hr over 30 Minutes Intravenous 3 times per day 08/18/13 1124     08/18/13 0500  piperacillin-tazobactam (ZOSYN) IVPB 3.375 g  Status:  Discontinued     3.375 g 12.5 mL/hr over 240 Minutes Intravenous 3 times per day 08/18/13 0434 08/18/13 1124      Assessment/Plan: S/P colectomy, colostomy, SBR, G tube PODs # 8/6 FEN - has done OK with G tube clamped. Will start TF today and see how he tolerates Resp - HTC X 48h, per CCM Anemia - heparin held and  receiving transfusion per CCM. No blood in ostomy output. ID - on Zosyn for peritonitis  LOS: 8 days    Jason Harmon E 08/26/2013

## 2013-08-26 NOTE — Progress Notes (Signed)
eLink Physician-Brief Progress Note Patient Name: Jason Harmon DOB: [birth year] MRN: [id number]  Date of Service  08/26/2013   HPI/Events of Note  Bloody trach secretions.  Now with drop in Hgb to 6.6.  Is on subq heparin.   eICU Interventions  Plan: Transfuse 1 u pRBC Post-transfusion CBC D/C subq heparin Monitor for additional bleeding   Intervention Category Intermediate Interventions: Bleeding - evaluation and treatment with blood products  DETERDING,ELIZABETH 08/26/2013, 4:20 AM

## 2013-08-26 NOTE — Progress Notes (Signed)
eLink Physician-Brief Progress Note Patient Name: Jason Harmon DOB: 21-Nov-1940 MRN: [id number]  Date of Service  08/26/2013   HPI/Events of Note   Hgb 6.4 after PRBC this am  eICU Interventions  2u PRBC now Follow CBC   Intervention Category Intermediate Interventions: Bleeding - evaluation and treatment with blood products  Regene Mccarthy S. 08/26/2013, 9:39 PM

## 2013-08-26 NOTE — Progress Notes (Signed)
PARENTERAL NUTRITION CONSULT NOTE - FOLLOW UP  Pharmacy Consult for TPN Indication: perforated viscous  No Known Allergies  Patient Measurements: Height: 5\' 11"  (180.3 cm) (5'11") Weight:  (86) IBW/kg (Calculated) : 75.3 Weight at [facility] 89.3 kg  Vital Signs: Temp: 98.5 F (36.9 C) (02/09 0652) Temp src: Axillary (02/09 0652) BP: 159/92 mmHg (02/09 0700) Pulse Rate: 112 (02/09 0700) Intake/Output from previous day: 02/08 0701 - 02/09 0700 In: 2410 [IV Piggyback:250; TPN:2160] Out: 4325 [Urine:3325; Stool:1000] Intake/Output from this shift:    Labs:  Recent Labs  08/24/13 0900 08/25/13 1314 08/26/13 0359  WBC 21.4* 18.8* 20.3*  HGB 7.4* 7.4* 6.6*  HCT 21.1* 21.1* 18.6*  PLT 215 218 199     Recent Labs  08/24/13 0500 08/25/13 0415 08/26/13 0347  NA 139 140 144  K 3.4* 3.3* 3.2*  CL 108 108 114*  CO2 15* 14* 12*  GLUCOSE 113* 110* 73  BUN 107* 109* 107*  CREATININE 3.19* 3.15* 3.08*  CALCIUM 7.1* 7.3* 7.4*  MG  --   --  1.5  PHOS  --   --  3.5  PROT  --   --  4.8*  ALBUMIN  --   --  1.4*  AST  --   --  21  ALT  --   --  20  ALKPHOS  --   --  101  BILITOT  --   --  1.0  TRIG  --   --  146   Estimated Creatinine Clearance: 23.8 ml/min (by C-G formula based on Cr of 3.08).    Recent Labs  08/25/13 1943 08/25/13 2339 08/26/13 0338  GLUCAP 92 99 97   Insulin Requirements in the past 24 hours:  Lantus 20 units BID, 2 units SSI  Current Nutrition:  At [facility], was on Clinimix E 5/20 at 65 ml/hr. 2/1 changed to Clinimix 5/15 at 75 ml/hr + lipids at 10 mlg/hr 2/4 Clinimix 5/15 at 80 ml/hr + lipids at 10 ml/hr- provides 96 gm of protein and 1843 kcals  Nutritional Goals: per RD note 2/2 2130 kCal, 120-135 grams of protein per day  Admit: 73 year old male with SBO and ischemic bowel s/p resection at [facility].  Was transferred to [facility] for continuing hemodialysis and inability to wean from ventilator.  2/1: developed a new bowel perforation at his  anastomosis site and is s/p ex lap with small bowel resection and wound vac placement. 2/2 did not tolerate HD, got 1 hr 2/3: OR small bowel anastomosis, colostomy, gastrostomy tube, blood transfusion pre op for Hg 5.9 2/4: POD 3- reexploration with small bowel anastmosis, colostomy g tube, intubated; no HD planned for today 2/5: POD 4 - Holding off on HD 2/6: POD 5- ostomy w/ liquid feculent output, bowels not ready for enteral feeds yet 2/7: POD 6- holding off on enteral feeds due to no bowel sounds. 2/8: POD 7- surgery clamping g-tube.  May be able to start tube feeds on 2/9.  GI: new bowel perforation s/p ex lap; was on TPN at [facility].  To OR 2/3  for small bowel anastomosis, colostomy, gastrostomy tube. Per CCS " He is high risk still for abdominal issues, continue abx for peritonitis, g tube to drainage, hold on enteral feeds until small bowel healed and has bowel function" Clamping g-tube on 2/8.  Nutrition: prealbumin 2/2 = 11.5 reflects inflammation post bowel perf; repeat prealbumin pending; TPN not at goal rate due to volume restrictions  Endo: CBGs <150 on Lantus  20 units BID and q4h SSI- well controlled  Lytes: No electrolytes in current TPN,  K 3.2- repleted with 81mEq by ELink MD this morning, Phos 3.5, Mag 1.5, CorCa ~9.4    Renal: renal function recovering- hasn't had HD since 2/2, but only tolerated 1 hour d/t tachycardia and decreased BP. Creat 3.08, BUN 107- both slowly improving; UOP 1.6 ml/kg/hr.   Hepatobil: LFTs WNL except albumin remains low; trig 146  Heme: bloody trach secretions overnight; Hgb 6.6- stopped subQ heparin, transfused 1 unit PRBC; plts dropped but were still WNL this morning at 199  ID: Abx at [facility]: Flagyl 1/21>>2/1, Meropenem 1/24>>2/1; Zosyn 2/1>> (CCM noted stated 7 days total, today should be the last day); no culture results from [facility]   TPN Access: CVC   Plan:  1. MD Starting TF with Vital High Protein at 95mL/hr- goal rate is 25mL/hr 2. Will  decrease Clinimix 5/15 (NO LYTES) + full multivitamin and trace elements to 60 ml/hr + 20% lipids at 10 ml/hr as TF are starting today. This provides 72 gm of protein and  1584 kcals which = 60% of protein and 75% kcal goals. 3. Continue electrolyte-free formula until renal advises to change to formula with standard lytes. 4. No insulin in TPN. Continue lantus 20 units BID and SSI q4h 5. Magnesium 1g IV x1 for repletion 6. BMET + Mag in the morning 7. Follow for surgery plans to increase TF and ability to wean TPN  Shakeema Lippman D. Mohini Heathcock, PharmD, BCPS Clinical Pharmacist Pager: [phone] 08/26/2013 8:12 AM

## 2013-08-26 NOTE — Progress Notes (Signed)
Pt having bright red bloody tracheal secretions. Dr Deterding notified, order received to follow up with am CBC

## 2013-08-26 NOTE — Progress Notes (Signed)
Inpatient Diabetes Program Recommendations  AACE/ADA: New Consensus Statement on Inpatient Glycemic Control (2013)  Target Ranges:  Prepandial:   less than 140 mg/dL      Peak postprandial:   less than 180 mg/dL (1-2 hours)      Critically ill patients:  140 - 180 mg/dL   Reason for Visit: Results for Jason Harmon, Jason Harmon (MRN [id number]) as of 08/26/2013 14:30  Ref. Range 08/25/2013 16:43 08/25/2013 19:43 08/25/2013 23:39 08/26/2013 03:38 08/26/2013 08:01  Glucose-Capillary Latest Range: 70-99 mg/dL 92 92 99 97 87    Diabetes history: None Current orders for Inpatient glycemic control: Lantus 20 units bid, Novolog correction 2-4-6 q 4 hours  Note patient to start tube feeds today and CBG's may increase.  If CBG's continue less than 100 mg/dL, consider decreasing Lantus 10 units bid.  Adah Perl, RN, BC-ADM Inpatient Diabetes Coordinator Pager [phone]

## 2013-08-26 NOTE — Progress Notes (Signed)
NUTRITION CONSULT/FOLLOW UP  INTERVENTION:  Vital 1.5 formula -- initiate at 15 ml/hr and increase by 10 ml every 8 hours to goal rate of 55 ml/hr with Prostat liquid protein 30 ml TID via tube to provide 2280 total kcals, 134 gm protein, 1011 ml of free water RD to follow for nutrition care plan  NUTRITION DIAGNOSIS: Inadequate oral intake related to inability to eat as evidenced by NPO status, ongoing  Goal: Pt to meet >/= 90% of their estimated nutrition needs, progressing  Monitor:  EN regimen & tolerance, respiratory status, weight, labs, I/O's  ASSESSMENT: 73 year old male with SBO and ischemic bowel s/p resection at [facility]. Was transferred to [facility] for continuing hemodialysis and inability to wean from ventilator. Admitted on 2/1 after he developed a new bowel perforation at his anastomosis site and is s/p ex lap with small bowel resection and wound vac placement. S/P trach on 1/22.   Patient s/p procedures 2/3: SMALL BOWEL ANASTOMOSIS STAMM GASTROSTOMY END SIGMOID COLOSTOMY  Patient currently off ventilatory support -- on trach collar.  RD consulted for TF initiation & management.  Vital High Protein formula currently infusing at 20 ml/hr via G-tube (clamped) providing 480 kcals, 42 gm protein -- tolerating.    Patient is receiving TPN with Clinimix 5/15 @ 60 ml/hr and lipids @ 10 ml/hr. Provides 1502 kcal and 72 grams protein per day.   Current TF regimen & TPN prescription is meeting 95% minimum estimated energy needs and 95% minimum estimated protein needs.  Noted likely will need LTACH placement.  Height: Ht Readings from Last 1 Encounters:  08/18/13 5\' 11"  (1.803 m)    Weight: Wt Readings from Last 1 Encounters:  08/25/13 189 lb 9.5 oz (86 kg)    Estimated Nutritional Needs: Kcal: 2100-2300 Protein: 120-135 gm Fluid: 2.1-2.3 L  Skin: stage 1 sacral wound; wound VAC to surgical abdominal wound  Diet Order: NPO   Intake/Output Summary (Last 24 hours)  at 08/26/13 1520 Last data filed at 08/26/13 1300  Gross per 24 hour  Intake 2982.5 ml  Output   3875 ml  Net -892.5 ml    Labs:   Recent Labs Lab 08/21/13 0355 08/22/13 0400  08/24/13 0500 08/25/13 0415 08/26/13 0347  NA 137 138  < > 139 140 144  K 3.6* 3.3*  < > 3.4* 3.3* 3.2*  CL 102 103  < > 108 108 114*  CO2 17* 16*  < > 15* 14* 12*  BUN 100* 107*  < > 107* 109* 107*  CREATININE 2.98* 3.24*  < > 3.19* 3.15* 3.08*  CALCIUM 6.5* 6.4*  < > 7.1* 7.3* 7.4*  MG 1.7 1.8  --   --   --  1.5  PHOS 5.6* 5.2*  --   --   --  3.5  GLUCOSE 137* 157*  < > 113* 110* 73  < > = values in this interval not displayed.  CBG (last 3)   Recent Labs  08/25/13 2339 08/26/13 0338 08/26/13 0801  GLUCAP 99 97 87    Scheduled Meds: . antiseptic oral rinse  15 mL Mouth Rinse QID  . chlorhexidine  15 mL Mouth Rinse BID  . darbepoetin (ARANESP) injection - NON-DIALYSIS  100 mcg Subcutaneous Q Mon-1800  . feeding supplement (VITAL HIGH PROTEIN)  1,000 mL Per Tube Q24H  . insulin aspart  2-6 Units Subcutaneous Q4H  . insulin glargine  20 Units Subcutaneous BID  . metoprolol  2.5 mg Intravenous Q6H  .  pantoprazole (PROTONIX) IV  40 mg Intravenous Q12H  . piperacillin-tazobactam (ZOSYN)  IV  2.25 g Intravenous Q8H    Continuous Infusions: . dextrose    . TPN (CLINIMIX) Adult without lytes     And  . fat emulsion    . TPN (CLINIMIX) Adult without lytes 80 mL/hr at 08/26/13 0800   And  . fat emulsion 250 mL (08/26/13 0800)  .  sodium bicarbonate 150 mEq in sterile water 1000 mL infusion 150 mEq (08/26/13 1045)    Past Medical History  Diagnosis Date  . Renal disorder   . A-fib   . Sepsis   . Ischemic bowel disease   . Acute renal failure     Past Surgical History  Procedure Laterality Date  . Abdominal surgery    . Laparotomy N/A 08/18/2013    Procedure: EXPLORATORY LAPAROTOMY;  Surgeon: Joyice Faster. Cornett, MD;  Location: [place];  Service: General;  Laterality: N/A;  .  Laparotomy N/A 08/20/2013    Procedure: EXPLORATORY LAPAROTOMY WITH  SMALL BOWEL RESECTION;  Surgeon: Rolm Bookbinder, MD;  Location: [place];  Service: General;  Laterality: N/A;  . Gastrostomy N/A 08/20/2013    Procedure: G-TUBE PLACEMENT;  Surgeon: Rolm Bookbinder, MD;  Location: [place];  Service: General;  Laterality: N/A;  . Colostomy N/A 08/20/2013    Procedure: COLOSTOMY;  Surgeon: Rolm Bookbinder, MD;  Location: [place];  Service: General;  Laterality: N/A;    Arthur Holms, RD, LDN Pager #: [phone] After-Hours Pager #: [phone]

## 2013-08-26 NOTE — Progress Notes (Signed)
eLink Physician-Brief Progress Note Patient Name: Jason Harmon DOB: 1940-09-28 MRN: [id number]  Date of Service  08/26/2013   HPI/Events of Note  Hypokalemia in the setting of advanced renal insufficiency.  On LR IVFs and receiving transfusion   eICU Interventions  Plan:  40 mEq via CVL   Intervention Category Intermediate Interventions: Electrolyte abnormality - evaluation and management  DETERDING,ELIZABETH 08/26/2013, 4:46 AM

## 2013-08-26 NOTE — Progress Notes (Signed)
PULMONARY  / CRITICAL CARE MEDICINE  Name: Jason Harmon MRN: [id number] DOB: January 06, 1941    ADMISSION DATE:  08/18/2013  CHIEF COMPLAINT:  Bowel perforation  BRIEF PATIENT DESCRIPTION:  73 yo male with SBO/ischemic bowel s/p resection with subsequent complicated medical course including respiratory failure and inability to wean from ventilator,admitted from [facility] with bowel perforation at anastomosis site.  SIGNIFICANT EVENTS: 1/22 Tracheostomy 2/01 Transfer from [facility], to ER, to [facility] ICU.  CCS consulted 2/1 Exploratory laparotomy with segmental resection of small bowel anastomosis and sigmoid colon with placement of abdominal wound vac 2/02 Off pressors, did not tolerate HD 2/4 small bowel anastomosis with gastrostomy & End sigmoid colostomy 2/8 ATC x 24h 2/9 Hemoptysis, anemia >> PRBC transfusion  STUDIES:  CT A/P with contrast 1/31 >>  bowel perforation, likely anastomotic breakdown at proximal enteroenterostomy with pneumoperitoneum and spilling of oral contrast, moderate ascites with peritonitis  LINES / TUBES: Tracheostomy 1/22>>> R IJ HD catheter 1/12>>>  CULTURES:  ANTIBIOTICS: Zosyn 2/1 >>  SUBJECTIVE: Coughing bloody secretions through trach.  Making urine.  PHYSICAL EXAM VITAL SIGNS: Temp:  [98.3 F (36.8 C)-99.1 F (37.3 C)] 98.7 F (37.1 C) (02/09 0802) Pulse Rate:  [105-122] 122 (02/09 0806) Resp:  [15-30] 27 (02/09 0806) BP: (146-176)/(83-121) 155/90 mmHg (02/09 0806) SpO2:  [94 %-100 %] 99 % (02/09 0806) FiO2 (%):  [21 %-28 %] 28 % (02/09 0806) Weight:  [189 lb 9.5 oz (86 kg)] 189 lb 9.5 oz (86 kg) (02/08 1300) Trach collar 28% INTAKE / OUTPUT: Intake/Output     02/08 0701 - 02/09 0700 02/09 0701 - 02/10 0700   IV Piggyback 250    TPN 2160    Total Intake(mL/kg) 2410 (28)    Urine (mL/kg/hr) 3325 (1.6) 450 (1.5)   Drains     Stool 1000 (0.5)    Total Output 4325 450   Net -1915 -450          PHYSICAL EXAMINATION: General: ill appearing,  awake Neuro: awake, follows simple commands HEENT:  Trach site with bloody secretions Cardiovascular:  Tachycardic, regular Lungs:  Coarse breath sounds anteriorly Abdomen:  Wound site clean Musculoskeletal:  2+ edema Skin:  No rash  LABS:  CBC Recent Labs     08/24/13  0900  08/25/13  1314  08/26/13  0359  WBC  21.4*  18.8*  20.3*  HGB  7.4*  7.4*  6.6*  HCT  21.1*  21.1*  18.6*  PLT  215  218  199    BMET Recent Labs     08/24/13  0500  08/25/13  0415  08/26/13  0347  NA  139  140  144  K  3.4*  3.3*  3.2*  CL  108  108  114*  CO2  15*  14*  12*  BUN  107*  109*  107*  CREATININE  3.19*  3.15*  3.08*  GLUCOSE  113*  110*  73    Electrolytes Recent Labs     08/24/13  0500  08/25/13  0415  08/26/13  0347  CALCIUM  7.1*  7.3*  7.4*  MG   --    --   1.5  PHOS   --    --   3.5    Liver Enzymes Recent Labs     08/26/13  0347  AST  21  ALT  20  ALKPHOS  101  BILITOT  1.0  ALBUMIN  1.4*   Glucose Recent Labs  08/25/13  1159  08/25/13  1643  08/25/13  1943  08/25/13  2339  08/26/13  0338  08/26/13  0801  GLUCAP  94  92  92  99  97  87    Imaging No results found.  ASSESSMENT / PLAN:  PULMONARY A: Acute respiratory failure in setting of perforated bowel.  S/p Tracheostomy 1/22. Hemoptysis develop 2//09 >> ? From tracheal suctioning. P: -trach collar as tolerated -f/u CXR -limit trach suctioning -prn BD's  CARDIOVASCULAR A: Septic shock from perforated bowel >> off pressors 2/02. Hx of A fib >> previously on amiodarone. P: -add scheduled IV lopressor 2/09  RENAL A: Acute kidney injury 2nd to ischemic bowel, shock >> started on HD January 2015 >> making urine. Non anion gap metabolic acidosis. Anasarca. P: -negative fluid balance as tolerated -continue HCO3 in IV fluid per renal   GASTROINTESTINAL A: Perforated bowel. Protein calorie malnutrition. P: -post-op care per CCS -advance diet per CCS >> start tube feeds  2/09 -protonix for SUP  HEMATOLOGIC A: Anemia of critical illness vs acute blood loss - no obvious source. P: -SCD for DVT prevention -f/u CBC >> transfuse for Hb < 7 or bleeding  INFECTIOUS A: Septic shock from bowel perforation. P: -Day 7 zosyn  ENDOCRINE A: Hyperglycemia. P: -SSI  NEUROLOGIC A: Acute encephalopathy 2nd to sepsis >> improving. Deconditioning. P: -will need PT/OT at some point >> likely will need LTAC as he continues to improve.  Updated family at bedside.  CC time 35 minutes.  Chesley Mires, MD [facility] Pulmonary/Critical Care 08/26/2013, 10:34 AM Pager:  [phone] After 3pm call: [phone]

## 2013-08-27 DIAGNOSIS — J189 Pneumonia, unspecified organism: Secondary | ICD-10-CM

## 2013-08-27 LAB — GLUCOSE, CAPILLARY
GLUCOSE-CAPILLARY: 131 mg/dL — AB (ref 70–99)
GLUCOSE-CAPILLARY: 122 mg/dL — AB (ref 70–99)
GLUCOSE-CAPILLARY: 121 mg/dL — AB (ref 70–99)
Glucose-Capillary: 128 mg/dL — ABNORMAL HIGH (ref 70–99)
Glucose-Capillary: 148 mg/dL — ABNORMAL HIGH (ref 70–99)
Glucose-Capillary: 155 mg/dL — ABNORMAL HIGH (ref 70–99)
Glucose-Capillary: 158 mg/dL — ABNORMAL HIGH (ref 70–99)

## 2013-08-27 LAB — LACTIC ACID, PLASMA: LACTIC ACID, VENOUS: 1.2 mmol/L (ref 0.5–2.2)

## 2013-08-27 LAB — CBC
HCT: 25.6 % — ABNORMAL LOW (ref 39.0–52.0)
Hemoglobin: 9 g/dL — ABNORMAL LOW (ref 13.0–17.0)
MCH: 30.6 pg (ref 26.0–34.0)
MCHC: 35.2 g/dL (ref 30.0–36.0)
MCV: 87.1 fL (ref 78.0–100.0)
Platelets: 167 10*3/uL (ref 150–400)
RBC: 2.94 MIL/uL — AB (ref 4.22–5.81)
RDW: 14.4 % (ref 11.5–15.5)
WBC: 26.1 10*3/uL — AB (ref 4.0–10.5)

## 2013-08-27 LAB — POCT I-STAT 3, ART BLOOD GAS (G3+)
ACID-BASE DEFICIT: 9 mmol/L — AB (ref 0.0–2.0)
BICARBONATE: 14.4 meq/L — AB (ref 20.0–24.0)
O2 Saturation: 93 %
PH ART: 7.358 (ref 7.350–7.450)
PO2 ART: 68 mmHg — AB (ref 80.0–100.0)
Patient temperature: 98.8
TCO2: 15 mmol/L (ref 0–100)
pCO2 arterial: 25.6 mmHg — ABNORMAL LOW (ref 35.0–45.0)

## 2013-08-27 LAB — BASIC METABOLIC PANEL
BUN: 103 mg/dL — ABNORMAL HIGH (ref 6–23)
CO2: 15 mEq/L — ABNORMAL LOW (ref 19–32)
Calcium: 7.5 mg/dL — ABNORMAL LOW (ref 8.4–10.5)
Chloride: 116 mEq/L — ABNORMAL HIGH (ref 96–112)
Creatinine, Ser: 2.87 mg/dL — ABNORMAL HIGH (ref 0.50–1.35)
GFR, EST AFRICAN AMERICAN: 24 mL/min — AB (ref >90)
GFR, EST NON AFRICAN AMERICAN: 21 mL/min — AB (ref >90)
Glucose, Bld: 135 mg/dL — ABNORMAL HIGH (ref 70–99)
POTASSIUM: 3.2 meq/L — AB (ref 3.7–5.3)
SODIUM: 149 meq/L — AB (ref 137–147)

## 2013-08-27 LAB — MAGNESIUM: Magnesium: 1.6 mg/dL (ref 1.5–2.5)

## 2013-08-27 LAB — PROCALCITONIN: PROCALCITONIN: 0.55 ng/mL

## 2013-08-27 MED ORDER — VITAL 1.5 CAL PO LIQD
1000.0000 mL | ORAL | Status: DC
  Administered 2013-08-30: 1000 mL
  Filled 2013-08-27 (×2): qty 1000

## 2013-08-27 MED ORDER — FENTANYL CITRATE 0.05 MG/ML IJ SOLN
50.0000 ug | INTRAMUSCULAR | Status: DC | PRN
  Administered 2013-08-27 – 2013-08-31 (×17): 50 ug via INTRAVENOUS
  Filled 2013-08-27 (×16): qty 2

## 2013-08-27 MED ORDER — FENTANYL CITRATE 0.05 MG/ML IJ SOLN: 25.0000 ug | INTRAMUSCULAR | Status: DC | PRN

## 2013-08-27 MED ORDER — LINEZOLID 2 MG/ML IV SOLN
600.0000 mg | Freq: Two times a day (BID) | INTRAVENOUS | Status: DC
  Administered 2013-08-27 – 2013-08-29 (×5): 600 mg via INTRAVENOUS
  Filled 2013-08-27 (×6): qty 300

## 2013-08-27 MED ORDER — TRACE MINERALS CR-CU-F-FE-I-MN-MO-SE-ZN IV SOLN
INTRAVENOUS | Status: AC
  Administered 2013-08-27: 18:00:00 via INTRAVENOUS
  Filled 2013-08-27: qty 2000

## 2013-08-27 MED ORDER — BIOTENE DRY MOUTH MT LIQD
15.0000 mL | Freq: Four times a day (QID) | OROMUCOSAL | Status: DC
  Administered 2013-08-27 – 2013-09-04 (×30): 15 mL via OROMUCOSAL

## 2013-08-27 MED ORDER — FAT EMULSION 20 % IV EMUL
240.0000 mL | INTRAVENOUS | Status: AC
  Administered 2013-08-27: 240 mL via INTRAVENOUS
  Filled 2013-08-27: qty 250

## 2013-08-27 MED ORDER — FENTANYL CITRATE 0.05 MG/ML IJ SOLN
50.0000 ug | INTRAMUSCULAR | Status: DC | PRN
  Filled 2013-08-27: qty 2

## 2013-08-27 MED ORDER — POTASSIUM CHLORIDE 10 MEQ/50ML IV SOLN
10.0000 meq | INTRAVENOUS | Status: AC
  Administered 2013-08-27 (×3): 10 meq via INTRAVENOUS
  Filled 2013-08-27 (×3): qty 50

## 2013-08-27 MED ORDER — METOPROLOL TARTRATE 1 MG/ML IV SOLN
5.0000 mg | INTRAVENOUS | Status: DC
  Administered 2013-08-27 – 2013-09-04 (×50): 5 mg via INTRAVENOUS
  Filled 2013-08-27 (×53): qty 5

## 2013-08-27 MED ORDER — INSULIN ASPART 100 UNIT/ML ~~LOC~~ SOLN
0.0000 [IU] | SUBCUTANEOUS | Status: DC
  Administered 2013-08-27 (×4): 2 [IU] via SUBCUTANEOUS
  Administered 2013-08-28 (×3): 3 [IU] via SUBCUTANEOUS
  Administered 2013-08-28: 2 [IU] via SUBCUTANEOUS
  Administered 2013-08-28 – 2013-08-29 (×3): 3 [IU] via SUBCUTANEOUS
  Administered 2013-08-29 (×2): 5 [IU] via SUBCUTANEOUS
  Administered 2013-08-29 – 2013-08-30 (×5): 3 [IU] via SUBCUTANEOUS
  Administered 2013-08-30 (×3): 2 [IU] via SUBCUTANEOUS
  Administered 2013-08-31 (×2): 3 [IU] via SUBCUTANEOUS
  Administered 2013-08-31 – 2013-09-02 (×8): 2 [IU] via SUBCUTANEOUS
  Administered 2013-09-02 (×2): 3 [IU] via SUBCUTANEOUS
  Administered 2013-09-03 (×2): 2 [IU] via SUBCUTANEOUS
  Administered 2013-09-03: 3 [IU] via SUBCUTANEOUS
  Administered 2013-09-03 – 2013-09-04 (×3): 2 [IU] via SUBCUTANEOUS

## 2013-08-27 MED ORDER — CHLORHEXIDINE GLUCONATE 0.12 % MT SOLN
15.0000 mL | Freq: Two times a day (BID) | OROMUCOSAL | Status: DC
Start: 2013-08-27 — End: 2013-09-04
  Administered 2013-08-27 – 2013-09-04 (×16): 15 mL via OROMUCOSAL
  Filled 2013-08-27 (×16): qty 15

## 2013-08-27 NOTE — Progress Notes (Signed)
PARENTERAL NUTRITION CONSULT NOTE - FOLLOW UP  Pharmacy Consult for TPN Indication: perforated viscous  No Known Allergies  Patient Measurements: Height: 5\' 11"  (180.3 cm) (5'11") Weight: 178 lb 12.7 oz (81.1 kg) IBW/kg (Calculated) : 75.3 Weight at [facility] 89.3 kg  Vital Signs: Temp: 98.8 F (37.1 C) (02/10 0745) Temp src: Oral (02/10 0745) BP: 174/102 mmHg (02/10 0800) Pulse Rate: 116 (02/10 0800) Intake/Output from previous day: 02/09 0701 - 02/10 0700 In: 6050.7 [I.V.:1858.8; Blood:971.3; NG/GT:406; IV Piggyback:750; TPN:1884.7] Out: 9892 [Urine:3285; JJHER:7408] Intake/Output from this shift: Total I/O In: 175 [I.V.:95; NG/GT:10; TPN:70] Out: 200 [Urine:200]  Labs:  Recent Labs  08/26/13 0359 08/26/13 1215 08/26/13 1955 08/27/13 0424  WBC 20.3*  --  22.3* 26.1*  HGB 6.6*  --  6.4* 9.0*  HCT 18.6*  --  18.2* 25.6*  PLT 199  --  172 167  APTT  --  37  --   --   INR  --  1.40  --   --      Recent Labs  08/25/13 0415 08/26/13 0347 08/27/13 0424  NA 140 144 149*  K 3.3* 3.2* 3.2*  CL 108 114* 116*  CO2 14* 12* 15*  GLUCOSE 110* 73 135*  BUN 109* 107* 103*  CREATININE 3.15* 3.08* 2.87*  CALCIUM 7.3* 7.4* 7.5*  MG  --  1.5 1.6  PHOS  --  3.5  --   PROT  --  4.8*  --   ALBUMIN  --  1.4*  --   AST  --  21  --   ALT  --  20  --   ALKPHOS  --  101  --   BILITOT  --  1.0  --   PREALBUMIN  --  13.2*  --   TRIG  --  146  --    Estimated Creatinine Clearance: 25.5 ml/min (by C-G formula based on Cr of 2.87).    Recent Labs  08/26/13 2346 08/27/13 0417 08/27/13 0743  GLUCAP 155* 131* 122*   Insulin Requirements in the past 24 hours:  Lantus 20 units BID, 10 units SSI  Current Nutrition:  -Clinimix NO LYTES 5/15 at 60 ml/hr + lipids at 10 ml/hr- 72 gm of protein and 1584 kcals -Vital 1.5 at 79mL/hr + Prostat 38mL TID- provides 61g protein and 660kcal (Goal rate is 88mL/hr with Prostat liquid 47mL PT TID. This will provide 134g protein and 2280  kcal)  Nutritional Goals: 2100-2300 kCal, 120-135 grams of protein per day per RD recommendations 2/22  Admit: 73 year old male with SBO and ischemic bowel s/p resection at [facility].  Was transferred to [facility] for continuing hemodialysis and inability to wean from ventilator.  2/1: developed a new bowel perforation at his anastomosis site and is s/p ex lap with small bowel resection and wound vac placement. 2/2 did not tolerate HD, got 1 hr 2/3: OR small bowel anastomosis, colostomy, gastrostomy tube, blood transfusion pre op for Hg 5.9 2/4: POD 3- reexploration with small bowel anastmosis, colostomy g tube, intubated; no HD planned for today 2/5: POD 4 - Holding off on HD 2/6: POD 5- ostomy w/ liquid feculent output, bowels not ready for enteral feeds yet 2/7: POD 6- holding off on enteral feeds due to no bowel sounds. 2/8: POD 7- surgery clamping g-tube.  May be able to start tube feeds on 2/9. 2/9: started TF. Emesis with increased rate  GI: new bowel perforation s/p ex lap; was on TPN at [facility].  To OR 2/3  for small bowel anastomosis, colostomy, gastrostomy tube.   Nutrition: TF started 2/9- pt had emesis when TF turned up to 79mL/hr- rate was turned down to 54mL/hr, this is to continue today. Attempting to advance tomorrow  Endo: CBGs <150 on Lantus 20 units BID and q4h SSI- well controlled  Lytes: No electrolytes in current TPN,  K 3.2-  47mEq ordered by ELink MD this morning, Phos 3.5, Mag 1.6, CorCa ~9.4    Renal: renal function recovering- hasn't had HD since 2/2, but only tolerated 1 hour d/t tachycardia and decreased BP. Creat 2.87, BUN 103- both improving; UOP 1.7 ml/kg/hr.   Hepatobil: LFTs WNL except albumin remains low; trig 146; prealbumin improved to 13.2-still below normal limits  Heme: had some bloody trach secretions and stopped subQ heparin, transfused 1 unit PRBC 2/9 AM and another 2 units 2/9 PM; Hgb 9 this morning, plts 167  ID: Abx at [facility]: Flagyl 1/21>>2/1,  Meropenem 1/24>>2/1;  Zosyn 2/1>> (CCM noted stated 7 days total, today is day #8) No culture results from [facility]   TPN Access: CVC   Plan:  1. TF to continue at 51mL/hr today + Prostat 13mL TID (61g protein + 660kcal) 2. Confirmed with renal that we can change formula to Clinimix E 5/15 + full multivitamin and trace elements- will continue at same rate of 60 ml/hr + 20% lipids at 10 ml/hr. This provides 72 gm of protein and 1584 kcals. 3. No insulin in TPN. Continue lantus 20 units BID and SSI q4h per MD 4. TPN labs as ordered 5. Follow for ability to increase TF rate and wean off TPN  Faatimah Spielberg D. Kip Cropp, PharmD, BCPS Clinical Pharmacist Pager: [phone] 08/27/2013 8:24 AM

## 2013-08-27 NOTE — Significant Event (Signed)
ABG    Component Value Date/Time   PHART 7.358 08/27/2013 0916   PCO2ART 25.6* 08/27/2013 0916   PO2ART 68.0* 08/27/2013 0916   HCO3 14.4* 08/27/2013 0916   TCO2 15 08/27/2013 0916   ACIDBASEDEF 9.0* 08/27/2013 0916   O2SAT 93.0 08/27/2013 0916    Significant acidosis likely contributing to increased WOB.  Will place on vent.  Will check lactic acid, procalcitonin.  Chesley Mires, MD [facility] Pulmonary/Critical Care 08/27/2013, 9:25 AM Pager:  [phone] After 3pm call: [phone]

## 2013-08-27 NOTE — Progress Notes (Addendum)
TF down to 10cc/hr. Abd soft, +BS. Patient examined and I agree with the assessment and plan  Georganna Skeans, MD, MPH, FACS Pager: [phone]  08/27/2013 1:22 PM

## 2013-08-27 NOTE — Progress Notes (Signed)
08/27/13 0945 placed patient back on  Full support due to WOB per Dr. Halford Chessman orders. Patient stable. RT will continue to monitor.Jason Harmon

## 2013-08-27 NOTE — Progress Notes (Signed)
Called elink and spoke with Dr. Earnest Conroy.  Advised him that pt central line sutures have all come dislodged expect one which appears to be pulling tight and site appears red.  This RN changed central line dressing and added stat lock to secure line better.  CVC felt to slide.  Dr. Earnest Conroy advised he would have PA come to assess.  Will continue to monitor.

## 2013-08-27 NOTE — Progress Notes (Signed)
eLink Physician-Brief Progress Note Patient Name: Jason Harmon DOB: [birth year] MRN: [id number]  Date of Service  08/27/2013   HPI/Events of Note  RN called re: 2 matters 1) diffuse erythema associated with vanc infusion (porbale Red man syndrome) 2) intolerance to TFs @ 50 cc/hr - emesis   eICU Interventions  1) Next dose of vanc scheduled for 2/11. Rounding MDs to decide on continuation. Consider reducing infusion rate 2) Decrease TFs to 10 cc/hr   Intervention Category Intermediate Interventions: Other:  Jason Harmon 08/27/2013, 12:34 AM

## 2013-08-27 NOTE — Progress Notes (Signed)
Patient ID: Jason Harmon, male   DOB: [birth year], 73 y.o.   MRN: [id number] 7 Days Post-Op  Subjective: Pt on trach collar.  vomited TF last night after TF increased to 50cc/hr.  Objective: Vital signs in last 24 hours: Temp:  [97.9 F (36.6 C)-99.6 F (37.6 C)] 98.8 F (37.1 C) (02/10 0745) Pulse Rate:  [102-127] 116 (02/10 0800) Resp:  [17-31] 29 (02/10 0800) BP: (141-180)/(82-112) 174/102 mmHg (02/10 0800) SpO2:  [94 %-100 %] 97 % (02/10 0800) FiO2 (%):  [28 %] 28 % (02/10 0737) Weight:  [178 lb 12.7 oz (81.1 kg)] 178 lb 12.7 oz (81.1 kg) (02/10 0456) Last BM Date: 08/26/13  Intake/Output from previous day: 02/09 0701 - 02/10 0700 In: 6050.7 [I.V.:1858.8; Blood:971.3; NG/GT:406; IV Piggyback:750; TPN:1884.7] Out: 3536 [Urine:3285; RWERX:5400] Intake/Output this shift:    PE: Abd: soft, seems more bloated, some BS noted, wound is clean and packed.  Ostomy with liquid feculent output.  Seems to keep leaking, although stoma is above skin level.  Lab Results:   Recent Labs  08/26/13 1955 08/27/13 0424  WBC 22.3* 26.1*  HGB 6.4* 9.0*  HCT 18.2* 25.6*  PLT 172 167   BMET  Recent Labs  08/26/13 0347 08/27/13 0424  NA 144 149*  K 3.2* 3.2*  CL 114* 116*  CO2 12* 15*  GLUCOSE 73 135*  BUN 107* 103*  CREATININE 3.08* 2.87*  CALCIUM 7.4* 7.5*   PT/INR  Recent Labs  08/26/13 1215  LABPROT 16.8*  INR 1.40   CMP     Component Value Date/Time   NA 149* 08/27/2013 0424   K 3.2* 08/27/2013 0424   CL 116* 08/27/2013 0424   CO2 15* 08/27/2013 0424   GLUCOSE 135* 08/27/2013 0424   BUN 103* 08/27/2013 0424   CREATININE 2.87* 08/27/2013 0424   CALCIUM 7.5* 08/27/2013 0424   PROT 4.8* 08/26/2013 0347   ALBUMIN 1.4* 08/26/2013 0347   AST 21 08/26/2013 0347   ALT 20 08/26/2013 0347   ALKPHOS 101 08/26/2013 0347   BILITOT 1.0 08/26/2013 0347   GFRNONAA 21* 08/27/2013 0424   GFRAA 24* 08/27/2013 0424   Lipase  No results found for this basename: lipase        Studies/Results: Dg Chest Port 1 View  08/26/2013   CLINICAL DATA:  Chronic ventilator dependent respiratory failure. Hemoptysis.  EXAM: PORTABLE CHEST - 1 VIEW  COMPARISON:  DG CHEST 1V PORT dated 08/23/2013; DG CHEST 1V PORT dated 08/22/2013; DG CHEST 1V PORT dated 08/21/2013; DG CHEST 1V PORT dated 08/20/2013; DG CHEST 1V PORT dated 08/14/2013  FINDINGS: Tracheostomy tube tip in satisfactory position below the thoracic and the projecting approximately 6 cm above the carina. Left jugular central venous catheter tip projects over the upper SVC, unchanged. Interval right jugular central venous catheter removal. Since the examination 3 days ago, development of airspace consolidation in the upper lobes, left greater than right. Stable dense consolidation in the left lower lobe.  IMPRESSION: 1. Support apparatus satisfactory. 2. Interval development of airspace consolidation in the upper lobes since the examination 3 days ago, consistent with pneumonia, query aspiration. 3. Stable dense left lower lobe atelectasis and/or pneumonia.   Electronically Signed   By: Evangeline Dakin M.D.   On: 08/26/2013 11:30    Anti-infectives: Anti-infectives   Start     Dose/Rate Route Frequency Ordered Stop   08/28/13 1900  vancomycin (VANCOCIN) 1,250 mg in sodium chloride 0.9 % 250 mL IVPB     1,250 mg 166.7  mL/hr over 90 Minutes Intravenous Every 48 hours 08/26/13 1758     08/26/13 1900  vancomycin (VANCOCIN) 1,750 mg in sodium chloride 0.9 % 500 mL IVPB     1,750 mg 250 mL/hr over 120 Minutes Intravenous  Once 08/26/13 1758 08/26/13 2044   08/18/13 1400  piperacillin-tazobactam (ZOSYN) IVPB 2.25 g     2.25 g 100 mL/hr over 30 Minutes Intravenous 3 times per day 08/18/13 1124     08/18/13 0500  piperacillin-tazobactam (ZOSYN) IVPB 3.375 g  Status:  Discontinued     3.375 g 12.5 mL/hr over 240 Minutes Intravenous 3 times per day 08/18/13 0434 08/18/13 1124       Assessment/Plan  1.Pod 9/7 reexploration with  small bowel anastmosis, colostomy and g tube  2. VDRF, on trach collar  3. anemia 4. PCM/TNA/TF  Plan: 1. RN to call [facility]OC to see if they can assess ostomy to determine why it keeps leaking.  His stoma does not seem retracted and is above skin level.  Will defer to their assessment. 2. Patient with some bloating today and emesis last night.  TFs turned down to 10cc/hr over night by CCM.  Will cont this for today and see if he improves and we can try advancement again tomorrow.  Cont TNA for now, until he can tolerate his TFs. 3. Cont WD dressing changes 4. Patient's WBC are trending back up.  I would recommend not stopping zosyn just yet.  Will continue to follow this. 5. Heparin held yesterday by CCM due to hgb drop.  hgb 9.0 today.  Continue to hold to see if this stabilizes.  No evidence of bleeding.    LOS: 9 days    Mazie Fencl E 08/27/2013, 8:10 AM Pager: [phone]

## 2013-08-27 NOTE — Progress Notes (Signed)
Pt vomitted moderate amount tube feeding.  G-tube residual checked-10cc. Dr. Alva Garnet notified and orders received.   Rash also noted on pts back.  Pharmacy and Dr. Alva Garnet notified.  Will monitor pt.

## 2013-08-27 NOTE — Progress Notes (Signed)
Admit: 08/18/2013 LOS: 26  25M with dialysis dependent prolonged AKI, with chronic ventilatory failure, small bowel perforation (repaired 08/20/13), abdominal septic shock.    Subjective:  Stable to improved renal function- great UOP-  hgb in the 6's last  night again getting transfused again Having high TF residuals Red man syndrome from vanc Seems more tachycardic and hypertensive this AM  02/09 0701 - 02/10 0700 In: 6050.7 [I.V.:1858.8; Blood:971.3; NG/GT:406; IV Piggyback:750; TPN:1884.7] Out: 8469 [Urine:3285; GEXBM:8413]  Filed Weights   08/25/13 1300 08/27/13 0456  Weight: 86 kg (189 lb 9.5 oz) 81.1 kg (178 lb 12.7 oz)    Current meds: reviewed Current Labs: reviewed   Physical Exam:  Blood pressure 174/102, pulse 116, temperature 98.8 F (37.1 C), temperature source Oral, resp. rate 29, height 5\' 11"  (1.803 m), weight 81.1 kg (178 lb 12.7 oz), SpO2 97.00%. GEN: chronically ill appearing, Trach, more awake and alert but no commands ENT: Trach - HD cath out EYES: EOMI. Eyes open to noise, stimullus  CV: tachy, regular  PULM: +Bs b/l  ABD: wound vac at midline  SKIN: large ventral abd incision  EXT: 2+ pitting edema b/l  Assessment/Plan 1. Prolonged Dialysis Dependent AKI: Appears at this time to be liberated from RRT with sufficient but low GFR and adequate UOP.  No HD since 2/2 (which was abandoned early 2/2 hemodynamin instability).  Creatinine seeming to trend down. Cont to follow closely. OK with judicious diuretics to assist with weaning as needed.  Although at tis time appears to be autodiuresing.  2. Anemia: transfusions per CCM and surgery.  Have added ESA- now also ?bleeding- off blood thinners 3. Septic shock and SB performation: per surgery and CCM. WBC climbing again- redman from vanc   Off pressors Mild metabolic acidosis: Is on bicarb drip- improving some 5. Dispo- has only been a month since he was in "good health" according to wife.  Feel like she would do  whatever it takes at this time.  Fortunately, dialysis not needed for now.  6. Hypokalemia- gentle repletion 7. Hypertension- maybe due to positive fluid balance mostly due to blood- will increase lopressor  Melady Chow A   08/27/2013, 8:10 AM   Recent Labs Lab 08/21/13 0355 08/22/13 0400  08/25/13 0415 08/26/13 0347 08/27/13 0424  NA 137 138  < > 140 144 149*  K 3.6* 3.3*  < > 3.3* 3.2* 3.2*  CL 102 103  < > 108 114* 116*  CO2 17* 16*  < > 14* 12* 15*  GLUCOSE 137* 157*  < > 110* 73 135*  BUN 100* 107*  < > 109* 107* 103*  CREATININE 2.98* 3.24*  < > 3.15* 3.08* 2.87*  CALCIUM 6.5* 6.4*  < > 7.3* 7.4* 7.5*  PHOS 5.6* 5.2*  --   --  3.5  --   < > = values in this interval not displayed.  Recent Labs Lab 08/26/13 0359 08/26/13 1955 08/27/13 0424  WBC 20.3* 22.3* 26.1*  HGB 6.6* 6.4* 9.0*  HCT 18.6* 18.2* 25.6*  MCV 86.9 87.9 87.1  PLT 199 172 167

## 2013-08-27 NOTE — Progress Notes (Signed)
PULMONARY  / CRITICAL CARE MEDICINE  Name: Jason Harmon MRN: [id number] DOB: [birth year]    ADMISSION DATE:  08/18/2013  CHIEF COMPLAINT:  Bowel perforation  BRIEF PATIENT DESCRIPTION:  73 yo male with SBO/ischemic bowel s/p resection with subsequent complicated medical course including respiratory failure and inability to wean from ventilator,admitted from [facility] with bowel perforation at anastomosis site.  SIGNIFICANT EVENTS: 1/22 Tracheostomy 2/01 Transfer from [facility], to ER, to [facility] ICU.  CCS consulted 2/1 Exploratory laparotomy with segmental resection of small bowel anastomosis and sigmoid colon with placement of abdominal wound vac 2/02 Off pressors, did not tolerate HD 2/4 small bowel anastomosis with gastrostomy & End sigmoid colostomy 2/8 ATC x 24h 2/9 Hemoptysis, anemia >> PRBC transfusion; CXR with new infiltrate 2/10 Red rash from vancomycin, increased WOB  STUDIES:  CT A/P with contrast 1/31 >>  bowel perforation, likely anastomotic breakdown at proximal enteroenterostomy with pneumoperitoneum and spilling of oral contrast, moderate ascites with peritonitis  LINES / TUBES: Tracheostomy 1/22>>> R IJ HD catheter 1/12>>>  CULTURES: Sputum 2/9 >>  ANTIBIOTICS: Zosyn 2/1 >> Vancomycin 2/9 >> 2/10 Linezolid 2/10 >>   SUBJECTIVE: Still having intermittent episodes of hemoptysis.  Increased WOB this AM.  Difficulty tolerating tube feeds.  Developed rash after getting vancomycin last night.  PHYSICAL EXAM VITAL SIGNS: Temp:  [97.9 F (36.6 C)-99.6 F (37.6 C)] 98.8 F (37.1 C) (02/10 0745) Pulse Rate:  [102-127] 116 (02/10 0800) Resp:  [17-31] 29 (02/10 0800) BP: (141-176)/(82-112) 174/102 mmHg (02/10 0800) SpO2:  [94 %-100 %] 97 % (02/10 0800) FiO2 (%):  [28 %] 28 % (02/10 0800) Weight:  [178 lb 12.7 oz (81.1 kg)] 178 lb 12.7 oz (81.1 kg) (02/10 0456) Trach collar 28% INTAKE / OUTPUT: Intake/Output     02/09 0701 - 02/10 0700 02/10 0701 - 02/11 0700   I.V.  (mL/kg) 1858.8 (22.9) 95 (1.2)   Blood 971.3    Other 180    NG/GT 406 10   IV Piggyback 750    TPN 1884.7 70   Total Intake(mL/kg) 6050.7 (74.6) 175 (2.2)   Urine (mL/kg/hr) 3285 (1.7) 200 (1.2)   Stool 1175 (0.6)    Total Output 4460 200   Net +1590.7 -25          PHYSICAL EXAMINATION: General: ill appearing, abdominal breathing pattern Neuro: sleepy HEENT:  Trach site with clear to bloody secretions Cardiovascular:  Tachycardic, regular Lungs:  Coarse breath sounds anteriorly Abdomen:  Wound site clean Musculoskeletal:  2+ edema Skin:  No rash  LABS:  CBC Recent Labs     08/26/13  0359  08/26/13  1955  08/27/13  0424  WBC  20.3*  22.3*  26.1*  HGB  6.6*  6.4*  9.0*  HCT  18.6*  18.2*  25.6*  PLT  199  172  167    BMET Recent Labs     08/25/13  0415  08/26/13  0347  08/27/13  0424  NA  140  144  149*  K  3.3*  3.2*  3.2*  CL  108  114*  116*  CO2  14*  12*  15*  BUN  109*  107*  103*  CREATININE  3.15*  3.08*  2.87*  GLUCOSE  110*  73  135*    Electrolytes Recent Labs     08/25/13  0415  08/26/13  0347  08/27/13  0424  CALCIUM  7.3*  7.4*  7.5*  MG   --   1.5  1.6  PHOS   --   3.5   --     Liver Enzymes Recent Labs     08/26/13  0347  AST  21  ALT  20  ALKPHOS  101  BILITOT  1.0  ALBUMIN  1.4*   Glucose Recent Labs     08/26/13  1147  08/26/13  1644  08/26/13  1944  08/26/13  2346  08/27/13  0417  08/27/13  0743  GLUCAP  106*  130*  115*  155*  131*  122*    Imaging Dg Chest Port 1 View  08/26/2013   CLINICAL DATA:  Chronic ventilator dependent respiratory failure. Hemoptysis.  EXAM: PORTABLE CHEST - 1 VIEW  COMPARISON:  DG CHEST 1V PORT dated 08/23/2013; DG CHEST 1V PORT dated 08/22/2013; DG CHEST 1V PORT dated 08/21/2013; DG CHEST 1V PORT dated 08/20/2013; DG CHEST 1V PORT dated 08/14/2013  FINDINGS: Tracheostomy tube tip in satisfactory position below the thoracic and the projecting approximately 6 cm above the carina. Left jugular  central venous catheter tip projects over the upper SVC, unchanged. Interval right jugular central venous catheter removal. Since the examination 3 days ago, development of airspace consolidation in the upper lobes, left greater than right. Stable dense consolidation in the left lower lobe.  IMPRESSION: 1. Support apparatus satisfactory. 2. Interval development of airspace consolidation in the upper lobes since the examination 3 days ago, consistent with pneumonia, query aspiration. 3. Stable dense left lower lobe atelectasis and/or pneumonia.   Electronically Signed   By: Evangeline Dakin M.D.   On: 08/26/2013 11:30    ASSESSMENT / PLAN:  PULMONARY A: Acute respiratory failure in setting of perforated bowel.  S/p Tracheostomy 1/22. Hemoptysis develop 2//09 >> ? From tracheal suctioning. HCAP developed 2/09. P: -trach collar as tolerated >> concern he may need to go back on vent in setting of HCAP -f/u CXR, ABG -limit trach suctioning -prn BD's -may need bronchoscopy for airway inspection if hemoptysis persists  CARDIOVASCULAR A: Septic shock from perforated bowel >> off pressors 2/02. Hx of A fib >> previously on amiodarone. P: -continue scheduled IV lopressor >> administration frequency increased 2/10  RENAL A: Acute kidney injury 2nd to ischemic bowel, shock >> started on HD January 2015 >> making urine. Non anion gap metabolic acidosis. Anasarca. P: -negative fluid balance as tolerated -continue HCO3 in IV fluid per renal   GASTROINTESTINAL A: Perforated bowel. Protein calorie malnutrition. P: -post-op care per CCS -advance diet per CCS >> started tube feeds 2/09, but difficulty tolerating -protonix for SUP  HEMATOLOGIC A: Anemia of critical illness vs acute blood loss - no obvious source. P: -SCD for DVT prevention -f/u CBC >> transfuse for Hb < 7 or bleeding  INFECTIOUS A: Septic shock from bowel perforation >> improved. HCAP developed 2/09 >> developed rash  from vancomycin. P: -Day 8 zosyn, add lizenolid in place of vancomycin 2/10  ENDOCRINE A: Hyperglycemia. P: -SSI  NEUROLOGIC A: Acute encephalopathy 2nd to sepsis >> improving. Deconditioning. P: -will need PT/OT at some point >> likely will need LTAC as he continues to improve.  Updated family at bedside.  CC time 35 minutes.  Chesley Mires, MD [facility] Pulmonary/Critical Care 08/27/2013, 9:01 AM Pager:  [phone] After 3pm call: [phone]

## 2013-08-28 ENCOUNTER — Inpatient Hospital Stay (HOSPITAL_COMMUNITY): Payer: Medicare Other

## 2013-08-28 LAB — PREPARE RBC (CROSSMATCH)

## 2013-08-28 LAB — RENAL FUNCTION PANEL
Albumin: 1.3 g/dL — ABNORMAL LOW (ref 3.5–5.2)
Albumin: 1.5 g/dL — ABNORMAL LOW (ref 3.5–5.2)
BUN: 95 mg/dL — AB (ref 6–23)
BUN: 95 mg/dL — ABNORMAL HIGH (ref 6–23)
CHLORIDE: 112 meq/L (ref 96–112)
CHLORIDE: 109 meq/L (ref 96–112)
CO2: 19 meq/L (ref 19–32)
CO2: 21 meq/L (ref 19–32)
Calcium: 7.1 mg/dL — ABNORMAL LOW (ref 8.4–10.5)
Calcium: 7.3 mg/dL — ABNORMAL LOW (ref 8.4–10.5)
Creatinine, Ser: 2.77 mg/dL — ABNORMAL HIGH (ref 0.50–1.35)
Creatinine, Ser: 2.68 mg/dL — ABNORMAL HIGH (ref 0.50–1.35)
GFR calc Af Amer: 25 mL/min — ABNORMAL LOW (ref >90)
GFR calc Af Amer: 26 mL/min — ABNORMAL LOW (ref >90)
GFR calc non Af Amer: 23 mL/min — ABNORMAL LOW (ref >90)
GFR, EST NON AFRICAN AMERICAN: 22 mL/min — AB (ref >90)
Glucose, Bld: 166 mg/dL — ABNORMAL HIGH (ref 70–99)
Glucose, Bld: 140 mg/dL — ABNORMAL HIGH (ref 70–99)
POTASSIUM: 3.3 meq/L — AB (ref 3.7–5.3)
Phosphorus: 3.5 mg/dL (ref 2.3–4.6)
Phosphorus: 3.8 mg/dL (ref 2.3–4.6)
Potassium: 2.9 mEq/L — CL (ref 3.7–5.3)
Sodium: 147 mEq/L (ref 137–147)
Sodium: 145 mEq/L (ref 137–147)

## 2013-08-28 LAB — GLUCOSE, CAPILLARY
GLUCOSE-CAPILLARY: 164 mg/dL — AB (ref 70–99)
GLUCOSE-CAPILLARY: 111 mg/dL — AB (ref 70–99)
GLUCOSE-CAPILLARY: 163 mg/dL — AB (ref 70–99)
GLUCOSE-CAPILLARY: 153 mg/dL — AB (ref 70–99)
Glucose-Capillary: 133 mg/dL — ABNORMAL HIGH (ref 70–99)
Glucose-Capillary: 153 mg/dL — ABNORMAL HIGH (ref 70–99)

## 2013-08-28 LAB — PROTIME-INR
INR: 1.37 (ref 0.00–1.49)
PROTHROMBIN TIME: 16.5 s — AB (ref 11.6–15.2)

## 2013-08-28 LAB — CBC
HCT: 18.3 % — ABNORMAL LOW (ref 39.0–52.0)
Hemoglobin: 6.7 g/dL — CL (ref 13.0–17.0)
MCH: 31.8 pg (ref 26.0–34.0)
MCHC: 36.6 g/dL — ABNORMAL HIGH (ref 30.0–36.0)
MCV: 86.7 fL (ref 78.0–100.0)
PLATELETS: 134 10*3/uL — AB (ref 150–400)
RBC: 2.11 MIL/uL — ABNORMAL LOW (ref 4.22–5.81)
RDW: 14.9 % (ref 11.5–15.5)
WBC: 18.7 10*3/uL — ABNORMAL HIGH (ref 4.0–10.5)

## 2013-08-28 LAB — PROCALCITONIN: Procalcitonin: 0.44 ng/mL

## 2013-08-28 LAB — HEMOGLOBIN AND HEMATOCRIT, BLOOD
HCT: 22.6 % — ABNORMAL LOW (ref 39.0–52.0)
HEMOGLOBIN: 8.1 g/dL — AB (ref 13.0–17.0)

## 2013-08-28 MED ORDER — POTASSIUM CHLORIDE 10 MEQ/50ML IV SOLN
10.0000 meq | INTRAVENOUS | Status: AC
  Administered 2013-08-28 (×4): 10 meq via INTRAVENOUS
  Filled 2013-08-28 (×4): qty 50

## 2013-08-28 MED ORDER — CLINIMIX E/DEXTROSE (5/15) 5 % IV SOLN
INTRAVENOUS | Status: AC
  Administered 2013-08-28: 17:00:00 via INTRAVENOUS
  Filled 2013-08-28: qty 2000

## 2013-08-28 MED ORDER — FAT EMULSION 20 % IV EMUL
240.0000 mL | INTRAVENOUS | Status: AC
  Administered 2013-08-28: 240 mL via INTRAVENOUS
  Filled 2013-08-28: qty 250

## 2013-08-28 MED ORDER — MIDAZOLAM HCL 2 MG/2ML IJ SOLN
2.0000 mg | INTRAMUSCULAR | Status: DC | PRN
  Administered 2013-08-28 – 2013-08-29 (×3): 2 mg via INTRAVENOUS
  Filled 2013-08-28 (×4): qty 2

## 2013-08-28 MED ORDER — MIDAZOLAM HCL 2 MG/2ML IJ SOLN
1.0000 mg | INTRAMUSCULAR | Status: DC | PRN
  Administered 2013-08-28: 2 mg via INTRAVENOUS
  Filled 2013-08-28: qty 2

## 2013-08-28 MED ORDER — FUROSEMIDE 10 MG/ML IJ SOLN
40.0000 mg | Freq: Once | INTRAMUSCULAR | Status: AC
  Administered 2013-08-28: 40 mg via INTRAVENOUS
  Filled 2013-08-28: qty 4

## 2013-08-28 NOTE — Progress Notes (Signed)
Likely has bled from his anastamosis. Colostomy output now is brown liquid. Chemical VTE prophylaxis has been held. Will check PT INR. Hopefully this will stop and not require further surgery which would be difficult for him to tolerate. Patient examined and I agree with the assessment and plan  Georganna Skeans, MD, MPH, FACS Pager: [phone]  08/28/2013 10:11 AM

## 2013-08-28 NOTE — Progress Notes (Signed)
Dr. Halford Chessman updated on pt status. Made aware pt having increasing O2 requirements-now on 60% FiO2 with O2 sats low 90s. HR now increased and sustaining high 110s-low120s. Pt appears overall more agitated and restless than this am. PRN fentanyl given with no relief noted. BP remains elevated despite scheduled lopressor. Orders received for PRN versed. Will continue to monitor closely. Fulton Reek, RN

## 2013-08-28 NOTE — Progress Notes (Signed)
PULMONARY  / CRITICAL CARE MEDICINE  Name: Jason Harmon MRN: [id number] DOB: Feb 28, 1941    ADMISSION DATE:  08/18/2013  CHIEF COMPLAINT:  Bowel perforation  BRIEF PATIENT DESCRIPTION:  73 yo male with SBO/ischemic bowel s/p resection with subsequent complicated medical course including respiratory failure and inability to wean from ventilator,admitted from [facility] with bowel perforation at anastomosis site.  SIGNIFICANT EVENTS: 1/22 Tracheostomy 2/01 Transfer from [facility], to ER, to [facility] ICU.  CCS consulted 2/1 Exploratory laparotomy with segmental resection of small bowel anastomosis and sigmoid colon with placement of abdominal wound vac 2/02 Off pressors, did not tolerate HD 2/4 small bowel anastomosis with gastrostomy & End sigmoid colostomy 2/8 ATC x 24h 2/9 Hemoptysis, anemia >> PRBC transfusion; CXR with new infiltrate 2/10 Red rash from vancomycin, increased WOB  STUDIES:  CT A/P with contrast 1/31 >>  bowel perforation, likely anastomotic breakdown at proximal enteroenterostomy with pneumoperitoneum and spilling of oral contrast, moderate ascites with peritonitis  LINES / TUBES: Tracheostomy 1/22>>> R IJ HD catheter 1/12>>>  CULTURES: Sputum 2/9 >>rare GPC>>>  ANTIBIOTICS: Zosyn 2/1 >> Vancomycin 2/9 >> 2/10 Linezolid 2/10 >>   SUBJECTIVE: Per RN, pt stable.  Minimal red/blood tinged drainage from trach site.  Attempted ATC this AM but pt did not tolerate so placed back on vent.  Per surgery, 1 Unit PRBC transfused for Hgb of 6.7, 4 runs of K today.  PHYSICAL EXAM VITAL SIGNS: Temp:  [98.3 F (36.8 C)-99.8 F (37.7 C)] 98.5 F (36.9 C) (02/11 0800) Pulse Rate:  [91-114] 113 (02/11 0900) Resp:  [21-29] 29 (02/11 0900) BP: (140-175)/(73-101) 167/101 mmHg (02/11 0900) SpO2:  [88 %-100 %] 88 % (02/11 0900) FiO2 (%):  [40 %] 40 % (02/11 0859) Weight:  [185 lb 13.6 oz (84.3 kg)] 185 lb 13.6 oz (84.3 kg) (02/11 0448) VENT SETTINGS: Vent Mode:  [-] PRVC FiO2 (%):   [40 %] 40 % Set Rate:  [18 bmp] 18 bmp Vt Set:  [600 mL] 600 mL PEEP:  [5 cmH20] 5 cmH20 Plateau Pressure:  [12 cmH20-22 cmH20] 19 cmH20 INTAKE / OUTPUT: Intake/Output     02/10 0701 - 02/11 0700 02/11 0701 - 02/12 0700   I.V. (mL/kg) 1970 (23.4) 150 (1.8)   Blood  325   Other 60 30   NG/GT 240 20   IV Piggyback 850 450   TPN 1680 140   Total Intake(mL/kg) 4800 (56.9) 1115 (13.2)   Urine (mL/kg/hr) 3505 (1.7) 450 (1.8)   Stool 950 (0.5) 525 (2.1)   Total Output 4455 975   Net +345 +140          PHYSICAL EXAMINATION: General: ill appearing, lying in bed on vent. Neuro: awake and following commands. HEENT:  Trach site with mild bloody secretions Cardiovascular:  Tachycardic, regular.  No M/R/G. Lungs:  Coarse breath sounds throughout Abdomen:  Dressings in place.  Melanotic appearing drainage in ostomy bag.  BS hypoactive. Musculoskeletal:  2+ edema, non-pitting. Skin:  Intact, no rash  LABS:  CBC Recent Labs     08/26/13  1955  08/27/13  0424  08/28/13  0413  WBC  22.3*  26.1*  18.7*  HGB  6.4*  9.0*  6.7*  HCT  18.2*  25.6*  18.3*  PLT  172  167  134*    BMET Recent Labs     08/26/13  0347  08/27/13  0424  08/28/13  0413  NA  144  149*  147  K  3.2*  3.2*  2.9*  CL  114*  116*  112  CO2  12*  15*  19  BUN  107*  103*  95*  CREATININE  3.08*  2.87*  2.77*  GLUCOSE  73  135*  166*    Electrolytes Recent Labs     08/26/13  0347  08/27/13  0424  08/28/13  0413  CALCIUM  7.4*  7.5*  7.1*  MG  1.5  1.6   --   PHOS  3.5   --   3.5    Liver Enzymes Recent Labs     08/26/13  0347  08/28/13  0413  AST  21   --   ALT  20   --   ALKPHOS  101   --   BILITOT  1.0   --   ALBUMIN  1.4*  1.3*   Glucose Recent Labs     08/27/13  1152  08/27/13  1538  08/27/13  1921  08/27/13  2338  08/28/13  0341  08/28/13  0736  GLUCAP  148*  128*  121*  158*  164*  111*    Imaging Dg Chest Port 1 View  08/28/2013   CLINICAL DATA:  Airspace disease   EXAM: PORTABLE CHEST - 1 VIEW  COMPARISON:  DG CHEST 1V PORT dated 08/26/2013; DG CHEST 1V PORT dated 08/23/2013; DG CHEST 1V PORT dated 08/22/2013  FINDINGS: Grossly unchanged cardiac silhouette and mediastinal contours. Stable position of support apparatus. A skin fold overlies the left upper lung. No definite pneumothorax. Improved aeration of the lungs with persistent bilateral perihilar predominant heterogeneous opacities with relative area of confluence within the right upper and left lower lungs. No new focal airspace opacities. No definite pleural effusion. Grossly unchanged bones.  IMPRESSION: 1.  Stable positioning of support apparatus.  No pneumothorax. 2. Minimally improved aeration of the lungs with persistent perihilar opacities which given apparent improvement is favored to represent improved alveolar pulmonary edema though note, underlying infection and/or aspiration is not excluded. Continued attention on follow-up is recommended.   Electronically Signed   By: Sandi Mariscal M.D.   On: 08/28/2013 07:51   Dg Chest Port 1 View  08/26/2013   CLINICAL DATA:  Chronic ventilator dependent respiratory failure. Hemoptysis.  EXAM: PORTABLE CHEST - 1 VIEW  COMPARISON:  DG CHEST 1V PORT dated 08/23/2013; DG CHEST 1V PORT dated 08/22/2013; DG CHEST 1V PORT dated 08/21/2013; DG CHEST 1V PORT dated 08/20/2013; DG CHEST 1V PORT dated 08/14/2013  FINDINGS: Tracheostomy tube tip in satisfactory position below the thoracic and the projecting approximately 6 cm above the carina. Left jugular central venous catheter tip projects over the upper SVC, unchanged. Interval right jugular central venous catheter removal. Since the examination 3 days ago, development of airspace consolidation in the upper lobes, left greater than right. Stable dense consolidation in the left lower lobe.  IMPRESSION: 1. Support apparatus satisfactory. 2. Interval development of airspace consolidation in the upper lobes since the examination 3 days ago,  consistent with pneumonia, query aspiration. 3. Stable dense left lower lobe atelectasis and/or pneumonia.   Electronically Signed   By: Evangeline Dakin M.D.   On: 08/26/2013 11:30    ASSESSMENT / PLAN:  PULMONARY A: Acute respiratory failure in setting of perforated bowel.  S/p Tracheostomy 1/22. Hemoptysis develop 2//09 >> ? From tracheal suctioning. HCAP developed 2/09. P: -continue daily ATC attempts >> concern he may need to go back on vent in setting of HCAP -limit trach suctioning -prn  BD's -Consider bronchoscopy for airway inspection if hemoptysis persists; unlikely to need at present. -f/u CXR  CARDIOVASCULAR A: Septic shock from perforated bowel >> off pressors 2/02. Hx of A fib >> previously on amiodarone. P: -continue scheduled IV lopressor >> administration frequency increased 2/10  RENAL A: Acute kidney injury 2nd to ischemic bowel, shock >> started on HD January 2015 >> making urine. Anion gap metabolic acidosis. Anasarca Hypokalemia P: -negative fluid balance as tolerated -continue HCO3 in IV fluid per renal  -continue K replacement as ordered by Surgery -f/u BMP @ 1500  GASTROINTESTINAL A: Perforated bowel. Protein calorie malnutrition. P: -post-op care per CCS -advance diet per CCS >> started tube feeds 2/09, tolerating ok. -protonix for SUP  HEMATOLOGIC A: Anemia of critical illness vs acute blood loss - no obvious source. P: -SCD for DVT prevention -f/u H/H at 1500 on 2/11 -transfuse for Hb < 7 or bleeding -f/u CBC in AM 2/12  INFECTIOUS A: Septic shock from bowel perforation >> improved. HCAP developed 2/09 >> developed rash from vancomycin. P: -Day 8 zosyn, day 2 linezolid -monitor WBC/fever curve  ENDOCRINE A: Hyperglycemia. P: -SSI  NEUROLOGIC A: Acute encephalopathy 2nd to sepsis >> improving. Deconditioning. P: -will need PT/OT at some point >> likely will need LTAC as he continues to improve.   Montey Hora, Domino during the described time interval was provided by me and/or other providers on the critical care team. I have reviewed this patient's available data, including medical history, events of note, physical examination and test results as part of my evaluation.  Reviewed above, examined pt, and agree with assessment/plan.  Respiratory status improved, but not ready for vent weaning yet.  Continue Abx.  F/u CXR, Hb.  Monitor renal fx.  Might be ready for transfer back to [place] soon once surgical issues stabile.  CC time 35 minutes.  Chesley Mires, MD [facility] Pulmonary/Critical Care 08/28/2013, 10:44 AM Pager:  [phone] After 3pm call: [phone]

## 2013-08-28 NOTE — Progress Notes (Signed)
Nickolas Madrid, NP at bedside to check on pt status. Updated on changes made earlier in shift by Dr. Halford Chessman in response to patient's increased WOB/RR/HR. Made aware pt seems somewhat improved after vent changes and PRN sedation. Made aware of afternoon lab results-hbg 8.1 and potassium 3.3-orders received to give 4 more runs KCL. Will continue to monitor closely. Fulton Reek, RN

## 2013-08-28 NOTE — Progress Notes (Signed)
[facility] Progress Note Patient Name: Jason Harmon DOB: August 17, 1940 MRN: [id number]  Date of Service  08/28/2013   HPI/Events of Note   hypokalemia  eICU Interventions  repleted   Intervention Category Major Interventions: Electrolyte abnormality - evaluation and management  Merton Border 08/28/2013, 5:19 AM

## 2013-08-28 NOTE — Progress Notes (Signed)
CRITICAL VALUE ALERT  Critical value received:  K+ 2.9  Date of notification:  08/28/13  Time of notification:  0508  Critical value read back:yes  Nurse who received alert:  Vivia Ewing, RN  Warren Lacy

## 2013-08-28 NOTE — Progress Notes (Signed)
Dr. Halford Chessman at bedside to assess pt. Vent changes made per MD. Orders received. Will continue to monitor closely. Fulton Reek, RN

## 2013-08-28 NOTE — Progress Notes (Signed)
CRITICAL VALUE ALERT  Critical value received:  Hgb 6.7  Date of notification:  08/28/13  Time of notification:  2703  Critical value read back:yes  Nurse who received alert:  Tommy Rainwater, RN  Elink notified

## 2013-08-28 NOTE — Progress Notes (Signed)
PA to bedside to assess CVC.

## 2013-08-28 NOTE — Progress Notes (Signed)
Salvadore Dom, PA at bedside.  Removed last existing suture on CVC and inserted two new ones to better secure CVC.  Will continue to monitor.

## 2013-08-28 NOTE — Progress Notes (Addendum)
PARENTERAL NUTRITION CONSULT NOTE - FOLLOW UP  Pharmacy Consult for TPN Indication: perforated viscous  No Known Allergies  Patient Measurements: Height: 5\' 11"  (180.3 cm) (5'11") Weight: 185 lb 13.6 oz (84.3 kg) IBW/kg (Calculated) : 75.3 Weight at [facility] 89.3 kg  Vital Signs: Temp: 98.5 F (36.9 C) (02/11 0800) Temp src: Oral (02/11 0800) BP: 167/101 mmHg (02/11 0900) Pulse Rate: 113 (02/11 0900) Intake/Output from previous day: 02/10 0701 - 02/11 0700 In: 4800 [I.V.:1970; NG/GT:240; IV Piggyback:850; TPN:1680] Out: 3299 [Urine:3505; Stool:950] Intake/Output from this shift: Total I/O In: 765 [I.V.:150; Blood:325; Other:30; NG/GT:20; IV Piggyback:100; TPN:140] Out: 975 [Urine:450; Stool:525]  Labs:  Recent Labs  08/26/13 1215 08/26/13 1955 08/27/13 0424 08/28/13 0413  WBC  --  22.3* 26.1* 18.7*  HGB  --  6.4* 9.0* 6.7*  HCT  --  18.2* 25.6* 18.3*  PLT  --  172 167 134*  APTT 37  --   --   --   INR 1.40  --   --   --      Recent Labs  08/26/13 0347 08/27/13 0424 08/28/13 0413  NA 144 149* 147  K 3.2* 3.2* 2.9*  CL 114* 116* 112  CO2 12* 15* 19  GLUCOSE 73 135* 166*  BUN 107* 103* 95*  CREATININE 3.08* 2.87* 2.77*  CALCIUM 7.4* 7.5* 7.1*  MG 1.5 1.6  --   PHOS 3.5  --  3.5  PROT 4.8*  --   --   ALBUMIN 1.4*  --  1.3*  AST 21  --   --   ALT 20  --   --   ALKPHOS 101  --   --   BILITOT 1.0  --   --   PREALBUMIN 13.2*  --   --   TRIG 146  --   --    Estimated Creatinine Clearance: 26.4 ml/min (by C-G formula based on Cr of 2.77).    Recent Labs  08/27/13 2338 08/28/13 0341 08/28/13 0736  GLUCAP 158* 164* 111*   Insulin Requirements in the past 24 hours:  12 units SSI  Current Nutrition:  -Clinimix E 5/15 at 60 ml/hr + lipids at 10 ml/hr- 72 gm of protein and 1584 kcals -Vital 1.5 at 67mL/hr + Prostat 93mL TID- provides 61g protein and 660kcal (Goal rate is 54mL/hr with Prostat liquid 52mL PT TID. This will provide 134g protein and 2280  kcal)  Nutritional Goals: 2100-2300 kCal, 120-135 grams of protein per day per RD recommendations 2/24  Admit: 73 year old male with SBO and ischemic bowel s/p resection at [facility].  Was transferred to [facility] for continuing hemodialysis and inability to wean from ventilator.  2/1: developed a new bowel perforation at his anastomosis site and is s/p ex lap with small bowel resection and wound vac placement. 2/2 did not tolerate HD, got 1 hr 2/3: OR small bowel anastomosis, colostomy, gastrostomy tube, blood transfusion pre op for Hg 5.9 2/4: POD 3- reexploration with small bowel anastmosis, colostomy g tube, intubated; no HD planned for today 2/5: POD 4 - Holding off on HD 2/6: POD 5- ostomy w/ liquid feculent output, bowels not ready for enteral feeds yet 2/7: POD 6- holding off on enteral feeds due to no bowel sounds. 2/8: POD 7- surgery clamping g-tube.  May be able to start tube feeds on 2/9. 2/9: started TF. Emesis with increased rate  GI: new bowel perforation s/p ex lap; was on TPN at [facility].  To OR 2/3  for  small bowel anastomosis, colostomy, gastrostomy tube.   Nutrition: TF started 2/9- pt had emesis when TF turned up to 51mL/hr on 2/9- rate was turned down to 22mL/hr which has continued- tolerating. Unable to advance as pt with frank melena  Endo: CBGs <150 on Lantus 20 units BID and q4h SSI- well controlled  Lytes:  K 2.9-  56mEq ordered by ELink MD this morning, renal to repeat K check this afternoon (may need more aggressive repletion) Phos 3.5, Mag 1.6, CorCa ~9.3    Renal: renal function recovering- hasn't had HD since 2/2, but only tolerated 1 hour d/t tachycardia and decreased BP. Creat 2.77, BUN 95- both continue to improve; UOP 1.7 ml/kg/hr. Sodium bicarb drip for metabolic acidosis per renal.  Hepatobil: LFTs WNL except albumin remains low at 1.3; trig 146; prealbumin improved to 13.2-still below normal limits  Heme: had some bloody trach secretions and stopped subQ  heparin, transfused 1 unit PRBC 2/9 AM and another 2 units 2/9 PM; Hgb 9 this morning, plts 167  ID: Abx at [facility]: Flagyl 1/21>>2/1, Meropenem 1/24>>2/1 Zosyn 2/1>> (CCM noted stated 7 days total, today is day #10) Vanc 2/9>>2/10 Linezolid 2/10>> 2/9 trach aspirate: rare GPC WBC down to 18.7, currently afebrile   TPN Access: CVC   Plan:  1. TF to continue at 2mL/hr today + Prostat 69mL TID (61g protein + 660kcal) 2. Continue Clinimix E 5/15 + full multivitamin and trace elements- will continue at same rate of 60 ml/hr + 20% lipids at 10 ml/hr. This provides 72 gm of protein and 1584 kcals. Combined with TF, providing 100% of nutritional needs 3. No insulin in TPN. SSI q4h per MD- may needto add back Lantus as CBGs are starting to run higher 4. TPN labs as ordered- full labs for tomorrow morning 5. Follow for ability to increase TF rate after GI bleeding resolved and wean off TPN  Jason Hoskie D. Nylan Nakatani, PharmD, BCPS Clinical Pharmacist Pager: [phone] 08/28/2013 9:42 AM

## 2013-08-28 NOTE — Progress Notes (Signed)
Patient ID: Jason Harmon, male   DOB: [birth year], 72 y.o.   MRN: [id number] 8 Days Post-Op  Subjective: Pt with melena from ostomy.  Tolerating TFs at 10cc/hr.  Objective: Vital signs in last 24 hours: Temp:  [98.3 F (36.8 C)-99.8 F (37.7 C)] 98.5 F (36.9 C) (02/11 0800) Pulse Rate:  [91-114] 114 (02/11 0800) Resp:  [21-29] 27 (02/11 0800) BP: (140-175)/(73-96) 157/94 mmHg (02/11 0800) SpO2:  [97 %-100 %] 100 % (02/11 0800) FiO2 (%):  [40 %] 40 % (02/11 0859) Weight:  [185 lb 13.6 oz (84.3 kg)] 185 lb 13.6 oz (84.3 kg) (02/11 0448) Last BM Date: 08/26/13  Intake/Output from previous day: 02/10 0701 - 02/11 0700 In: 4800 [I.V.:1970; NG/GT:240; IV Piggyback:850; TPN:1680] Out: 4455 [Urine:3505; Stool:950] Intake/Output this shift: Total I/O In: 410 [I.V.:150; NG/GT:20; IV Piggyback:100; TPN:140] Out: 975 [Urine:450; Stool:525]  PE: Abd: soft, less distended today, some BS, ostomy drainage bag with melenotic liquid output.  Wound is clean. Neck: trach with a little fresh blood with suction as well  Lab Results:   Recent Labs  08/27/13 0424 08/28/13 0413  WBC 26.1* 18.7*  HGB 9.0* 6.7*  HCT 25.6* 18.3*  PLT 167 134*   BMET  Recent Labs  08/27/13 0424 08/28/13 0413  NA 149* 147  K 3.2* 2.9*  CL 116* 112  CO2 15* 19  GLUCOSE 135* 166*  BUN 103* 95*  CREATININE 2.87* 2.77*  CALCIUM 7.5* 7.1*   PT/INR  Recent Labs  08/26/13 1215  LABPROT 16.8*  INR 1.40   CMP     Component Value Date/Time   NA 147 08/28/2013 0413   K 2.9* 08/28/2013 0413   CL 112 08/28/2013 0413   CO2 19 08/28/2013 0413   GLUCOSE 166* 08/28/2013 0413   BUN 95* 08/28/2013 0413   CREATININE 2.77* 08/28/2013 0413   CALCIUM 7.1* 08/28/2013 0413   PROT 4.8* 08/26/2013 0347   ALBUMIN 1.3* 08/28/2013 0413   AST 21 08/26/2013 0347   ALT 20 08/26/2013 0347   ALKPHOS 101 08/26/2013 0347   BILITOT 1.0 08/26/2013 0347   GFRNONAA 22* 08/28/2013 0413   GFRAA 25* 08/28/2013 0413   Lipase  No results  found for this basename: lipase       Studies/Results: Dg Chest Port 1 View  08/28/2013   CLINICAL DATA:  Airspace disease  EXAM: PORTABLE CHEST - 1 VIEW  COMPARISON:  DG CHEST 1V PORT dated 08/26/2013; DG CHEST 1V PORT dated 08/23/2013; DG CHEST 1V PORT dated 08/22/2013  FINDINGS: Grossly unchanged cardiac silhouette and mediastinal contours. Stable position of support apparatus. A skin fold overlies the left upper lung. No definite pneumothorax. Improved aeration of the lungs with persistent bilateral perihilar predominant heterogeneous opacities with relative area of confluence within the right upper and left lower lungs. No new focal airspace opacities. No definite pleural effusion. Grossly unchanged bones.  IMPRESSION: 1.  Stable positioning of support apparatus.  No pneumothorax. 2. Minimally improved aeration of the lungs with persistent perihilar opacities which given apparent improvement is favored to represent improved alveolar pulmonary edema though note, underlying infection and/or aspiration is not excluded. Continued attention on follow-up is recommended.   Electronically Signed   By: Sandi Mariscal M.D.   On: 08/28/2013 07:51   Dg Chest Port 1 View  08/26/2013   CLINICAL DATA:  Chronic ventilator dependent respiratory failure. Hemoptysis.  EXAM: PORTABLE CHEST - 1 VIEW  COMPARISON:  DG CHEST 1V PORT dated 08/23/2013; DG CHEST 1V PORT dated  08/22/2013; DG CHEST 1V PORT dated 08/21/2013; DG CHEST 1V PORT dated 08/20/2013; DG CHEST 1V PORT dated 08/14/2013  FINDINGS: Tracheostomy tube tip in satisfactory position below the thoracic and the projecting approximately 6 cm above the carina. Left jugular central venous catheter tip projects over the upper SVC, unchanged. Interval right jugular central venous catheter removal. Since the examination 3 days ago, development of airspace consolidation in the upper lobes, left greater than right. Stable dense consolidation in the left lower lobe.  IMPRESSION: 1. Support  apparatus satisfactory. 2. Interval development of airspace consolidation in the upper lobes since the examination 3 days ago, consistent with pneumonia, query aspiration. 3. Stable dense left lower lobe atelectasis and/or pneumonia.   Electronically Signed   By: Evangeline Dakin M.D.   On: 08/26/2013 11:30    Anti-infectives: Anti-infectives   Start     Dose/Rate Route Frequency Ordered Stop   08/28/13 1900  vancomycin (VANCOCIN) 1,250 mg in sodium chloride 0.9 % 250 mL IVPB  Status:  Discontinued     1,250 mg 166.7 mL/hr over 90 Minutes Intravenous Every 48 hours 08/26/13 1758 08/27/13 0900   08/27/13 0930  linezolid (ZYVOX) IVPB 600 mg     600 mg 300 mL/hr over 60 Minutes Intravenous Every 12 hours 08/27/13 0900     08/26/13 1900  vancomycin (VANCOCIN) 1,750 mg in sodium chloride 0.9 % 500 mL IVPB     1,750 mg 250 mL/hr over 120 Minutes Intravenous  Once 08/26/13 1758 08/26/13 2044   08/18/13 1400  piperacillin-tazobactam (ZOSYN) IVPB 2.25 g     2.25 g 100 mL/hr over 30 Minutes Intravenous 3 times per day 08/18/13 1124     08/18/13 0500  piperacillin-tazobactam (ZOSYN) IVPB 3.375 g  Status:  Discontinued     3.375 g 12.5 mL/hr over 240 Minutes Intravenous 3 times per day 08/18/13 0434 08/18/13 1124       Assessment/Plan  1.Pod 10/8 reexploration with small bowel anastmosis, colostomy and g tube  2. VDRF, intermittent trach collar vs vent 3. Anemia, ABL secondary to melena from GI tract  4. PCM/TNA/TF   Plan: 1. Patient with frank melena in ostomy bag today.  Being transfused 1 unit of pRBCs per CCM.  His chemical prophylaxis is being held already.  Will d/w MD for further recommendations. 2. May hold on TF advancement while actively bleeding from GI tract 3. Continue routine dressing care 4. WBC back down today. 5. Hypokalemia being replaced by CCM.  LOS: 10 days    Bradee Common E 08/28/2013, 9:09 AM Pager: [phone]

## 2013-08-28 NOTE — Progress Notes (Signed)
Admit: 08/18/2013 LOS: 35  [name] with dialysis dependent prolonged AKI, with chronic ventilatory failure, small bowel perforation (repaired 08/20/13), abdominal septic shock.    Subjective:  Stable to improved renal function- great UOP-  hgb in the 6's last  night again getting transfused again Having high TF residuals Had to go back on full vent support   02/10 0701 - 02/11 0700 In: 4800 [I.V.:1970; NG/GT:240; IV Piggyback:850; TPN:1680] Out: 9470 [Urine:3505; Stool:950]  Filed Weights   08/25/13 1300 08/27/13 0456 08/28/13 0448  Weight: 86 kg (189 lb 9.5 oz) 81.1 kg (178 lb 12.7 oz) 84.3 kg (185 lb 13.6 oz)    Current meds: reviewed Current Labs: reviewed   Physical Exam:  Blood pressure 157/94, pulse 114, temperature 98.5 F (36.9 C), temperature source Oral, resp. rate 27, height 5\' 11"  (1.803 m), weight 84.3 kg (185 lb 13.6 oz), SpO2 100.00%. GEN: chronically ill appearing, Trach, more awake and alert but no commands ENT: Trach - HD cath out EYES: EOMI. Eyes open to noise, stimullus  CV: tachy, regular  PULM: +Bs b/l  ABD: wound vac at midline  SKIN: large ventral abd incision  EXT: 2+ pitting edema b/l  Assessment/Plan 1. Prolonged Dialysis Dependent AKI: Appears at this time to be liberated from RRT with sufficient but low GFR and adequate UOP.  No HD since 2/2 (which was abandoned early 2/2 hemodynamin instability).  Creatinine seeming to trend down. Cont to follow closely. OK with judicious diuretics to assist with weaning as needed.  Although at tis time appears to be autodiuresing.  2. Anemia: transfusions per CCM and surgery.  Have added ESA- now also ?bleeding- off blood thinners 3. Septic shock and SB performation: per surgery and CCM. WBC climbing again- redman from vanc   Off pressors Mild metabolic acidosis: Is on bicarb drip- improving some 5. Dispo- has only been a month since he was in "good health" according to wife.  Feel like she would do whatever it takes at  this time.  Fortunately, dialysis not needed for now.  6. Hypokalemia- gentle repletion- given 4 runs this AM- will try to be more aggressive - will recheck potassium this PM and act as needed - also adding back K to TPN 7. Hypertension- maybe due to positive fluid balance mostly due to blood- have increased lopressor  Jason Harmon A   08/28/2013, 9:00 AM   Recent Labs Lab 08/22/13 0400  08/26/13 0347 08/27/13 0424 08/28/13 0413  NA 138  < > 144 149* 147  K 3.3*  < > 3.2* 3.2* 2.9*  CL 103  < > 114* 116* 112  CO2 16*  < > 12* 15* 19  GLUCOSE 157*  < > 73 135* 166*  BUN 107*  < > 107* 103* 95*  CREATININE 3.24*  < > 3.08* 2.87* 2.77*  CALCIUM 6.4*  < > 7.4* 7.5* 7.1*  PHOS 5.2*  --  3.5  --  3.5  < > = values in this interval not displayed.  Recent Labs Lab 08/26/13 1955 08/27/13 0424 08/28/13 0413  WBC 22.3* 26.1* 18.7*  HGB 6.4* 9.0* 6.7*  HCT 18.2* 25.6* 18.3*  MCV 87.9 87.1 86.7  PLT 172 167 134*

## 2013-08-28 NOTE — Progress Notes (Signed)
[place] Progress Note Patient Name: Jason Harmon DOB: 10-10-1940 MRN: [id number]  Date of Service  08/28/2013   HPI/Events of Note  Hgb 6.7   eICU Interventions  One unit PRBCs ordered   Intervention Category Major Interventions: Other:  Merton Border 08/28/2013, 4:53 AM

## 2013-08-28 NOTE — Progress Notes (Signed)
Dr. Halford Chessman made aware pt continues to have increased WOB and increased RR despite PRN fentanyl and versed. MD to come assess pt at bedside. Will continue to monitor closely. Fulton Reek, RN

## 2013-08-29 ENCOUNTER — Inpatient Hospital Stay (HOSPITAL_COMMUNITY): Payer: Medicare Other

## 2013-08-29 ENCOUNTER — Encounter (HOSPITAL_COMMUNITY): Payer: Self-pay | Admitting: Radiology

## 2013-08-29 DIAGNOSIS — E46 Unspecified protein-calorie malnutrition: Secondary | ICD-10-CM

## 2013-08-29 DIAGNOSIS — A498 Other bacterial infections of unspecified site: Secondary | ICD-10-CM | POA: Diagnosis not present

## 2013-08-29 DIAGNOSIS — Z1624 Resistance to multiple antibiotics: Secondary | ICD-10-CM

## 2013-08-29 LAB — COMPREHENSIVE METABOLIC PANEL
ALT: 17 U/L (ref 0–53)
AST: 20 U/L (ref 0–37)
Albumin: 1.3 g/dL — ABNORMAL LOW (ref 3.5–5.2)
Alkaline Phosphatase: 79 U/L (ref 39–117)
BUN: 91 mg/dL — ABNORMAL HIGH (ref 6–23)
CO2: 22 meq/L (ref 19–32)
Calcium: 7.1 mg/dL — ABNORMAL LOW (ref 8.4–10.5)
Chloride: 111 mEq/L (ref 96–112)
Creatinine, Ser: 2.58 mg/dL — ABNORMAL HIGH (ref 0.50–1.35)
GFR, EST AFRICAN AMERICAN: 27 mL/min — AB (ref >90)
GFR, EST NON AFRICAN AMERICAN: 24 mL/min — AB (ref >90)
Glucose, Bld: 225 mg/dL — ABNORMAL HIGH (ref 70–99)
Potassium: 3.4 mEq/L — ABNORMAL LOW (ref 3.7–5.3)
Sodium: 149 mEq/L — ABNORMAL HIGH (ref 137–147)
Total Bilirubin: 1.5 mg/dL — ABNORMAL HIGH (ref 0.3–1.2)
Total Protein: 4.2 g/dL — ABNORMAL LOW (ref 6.0–8.3)

## 2013-08-29 LAB — GLUCOSE, CAPILLARY
GLUCOSE-CAPILLARY: 187 mg/dL — AB (ref 70–99)
GLUCOSE-CAPILLARY: 180 mg/dL — AB (ref 70–99)
GLUCOSE-CAPILLARY: 232 mg/dL — AB (ref 70–99)
Glucose-Capillary: 156 mg/dL — ABNORMAL HIGH (ref 70–99)
Glucose-Capillary: 160 mg/dL — ABNORMAL HIGH (ref 70–99)
Glucose-Capillary: 204 mg/dL — ABNORMAL HIGH (ref 70–99)

## 2013-08-29 LAB — CBC
HCT: 15.8 % — ABNORMAL LOW (ref 39.0–52.0)
HEMOGLOBIN: 5.7 g/dL — AB (ref 13.0–17.0)
MCH: 31.8 pg (ref 26.0–34.0)
MCHC: 36.1 g/dL — AB (ref 30.0–36.0)
MCV: 88.3 fL (ref 78.0–100.0)
PLATELETS: 127 10*3/uL — AB (ref 150–400)
RBC: 1.79 MIL/uL — AB (ref 4.22–5.81)
RDW: 14.9 % (ref 11.5–15.5)
WBC: 22.7 10*3/uL — ABNORMAL HIGH (ref 4.0–10.5)

## 2013-08-29 LAB — CULTURE, RESPIRATORY W GRAM STAIN

## 2013-08-29 LAB — PHOSPHORUS: Phosphorus: 3.8 mg/dL (ref 2.3–4.6)

## 2013-08-29 LAB — HEMOGLOBIN AND HEMATOCRIT, BLOOD
HCT: 25.6 % — ABNORMAL LOW (ref 39.0–52.0)
HCT: 21.1 % — ABNORMAL LOW (ref 39.0–52.0)
Hemoglobin: 9.4 g/dL — ABNORMAL LOW (ref 13.0–17.0)
Hemoglobin: 7.5 g/dL — ABNORMAL LOW (ref 13.0–17.0)

## 2013-08-29 LAB — PREPARE RBC (CROSSMATCH)

## 2013-08-29 LAB — CULTURE, RESPIRATORY

## 2013-08-29 LAB — PROCALCITONIN: Procalcitonin: 0.69 ng/mL

## 2013-08-29 LAB — MAGNESIUM: Magnesium: 1.5 mg/dL (ref 1.5–2.5)

## 2013-08-29 MED ORDER — FUROSEMIDE 10 MG/ML IJ SOLN
80.0000 mg | Freq: Two times a day (BID) | INTRAMUSCULAR | Status: DC
  Administered 2013-08-29 – 2013-09-02 (×8): 80 mg via INTRAVENOUS
  Filled 2013-08-29 (×14): qty 8

## 2013-08-29 MED ORDER — POTASSIUM CHLORIDE 10 MEQ/50ML IV SOLN
10.0000 meq | INTRAVENOUS | Status: AC
  Administered 2013-08-29 (×4): 10 meq via INTRAVENOUS
  Filled 2013-08-29 (×4): qty 50

## 2013-08-29 MED ORDER — TRACE MINERALS CR-CU-F-FE-I-MN-MO-SE-ZN IV SOLN
INTRAVENOUS | Status: AC
  Administered 2013-08-29: 18:00:00 via INTRAVENOUS
  Filled 2013-08-29: qty 2000

## 2013-08-29 MED ORDER — MAGNESIUM SULFATE 40 MG/ML IJ SOLN
2.0000 g | Freq: Once | INTRAMUSCULAR | Status: AC
  Administered 2013-08-29: 2 g via INTRAVENOUS
  Filled 2013-08-29: qty 50

## 2013-08-29 MED ORDER — FAT EMULSION 20 % IV EMUL
240.0000 mL | INTRAVENOUS | Status: AC
  Administered 2013-08-29: 240 mL via INTRAVENOUS
  Filled 2013-08-29: qty 250

## 2013-08-29 MED ORDER — SULFAMETHOXAZOLE-TRIMETHOPRIM 400-80 MG/5ML IV SOLN
400.0000 mg | Freq: Two times a day (BID) | INTRAVENOUS | Status: DC
  Administered 2013-08-29 – 2013-09-04 (×13): 400 mg via INTRAVENOUS
  Filled 2013-08-29 (×27): qty 25

## 2013-08-29 NOTE — Progress Notes (Signed)
GI bleed - suspect it is from the anastamosis. Will see if angio is an option. I am afraid he is not likely to survive another surgery. I will D/W his family. Patient examined and I agree with the assessment and plan  Georganna Skeans, MD, MPH, FACS Pager: [phone]  08/29/2013 8:39 AM

## 2013-08-29 NOTE — Progress Notes (Signed)
PULMONARY  / CRITICAL CARE MEDICINE  Name: Jason Harmon MRN: [id number] DOB: October 02, 1940    ADMISSION DATE:  08/18/2013  CHIEF COMPLAINT:  Bowel perforation  BRIEF PATIENT DESCRIPTION:  72 yo male with SBO/ischemic bowel s/p resection with subsequent complicated medical course including respiratory failure and inability to wean from ventilator,[facility] with bowel perforation at anastomosis site.  SIGNIFICANT EVENTS: 1/22 Tracheostomy 2/01 Transfer from [facility], to ER, to [facility] ICU.  CCS consulted 2/1 Exploratory laparotomy with segmental resection of small bowel anastomosis and sigmoid colon with placement of abdominal wound vac 2/02 Off pressors, did not tolerate HD 2/4 small bowel anastomosis with gastrostomy & End sigmoid colostomy 2/8 ATC x 24h 2/9 Hemoptysis, anemia >> PRBC transfusion; CXR with new infiltrate 2/10 Red rash from vancomycin, increased WOB 2/12 2 units PRBC transfusion  STUDIES:  CT A/P with contrast 1/31 >>  bowel perforation, likely anastomotic breakdown at proximal enteroenterostomy with pneumoperitoneum and spilling of oral contrast, moderate ascites with peritonitis  LINES / TUBES: Tracheostomy 1/22>>> R IJ HD catheter 1/12>>>  CULTURES: Sputum 2/9 >> Moderate Stenotrophomonas maltophilia  ANTIBIOTICS: Zosyn 2/1 >> 2/12 Vancomycin 2/9 >> 2/10 Linezolid 2/10 >> 2/12 Bactrim 2/12 >>   SUBJECTIVE: Did not tolerate trach collar.  Continues to have bloody secretions.  PHYSICAL EXAM VITAL SIGNS: Temp:  [97.5 F (36.4 C)-100.2 F (37.9 C)] 99.7 F (37.6 C) (02/12 1000) Pulse Rate:  [79-135] 123 (02/12 1000) Resp:  [20-35] 24 (02/12 1000) BP: (122-171)/(78-104) 153/93 mmHg (02/12 1000) SpO2:  [90 %-100 %] 96 % (02/12 1000) FiO2 (%):  [40 %-60 %] 40 % (02/12 1000) Weight:  [179 lb 14.3 oz (81.6 kg)] 179 lb 14.3 oz (81.6 kg) (02/12 0447) VENT SETTINGS: Vent Mode:  [-] PRVC FiO2 (%):  [40 %-60 %] 40 % Set Rate:  [18 bmp-24 bmp] 24 bmp Vt  Set:  [600 mL] 600 mL PEEP:  [5 cmH20-8 cmH20] 8 cmH20 Plateau Pressure:  [20 cmH20-27 cmH20] 21 cmH20 INTAKE / OUTPUT: Intake/Output     02/11 0701 - 02/12 0700 02/12 0701 - 02/13 0700   I.V. (mL/kg) 1800 (22.1) 150 (1.8)   Blood 662.5 325   Other 180 30   NG/GT 240 30   IV Piggyback 1050 400   TPN 1680 210   Total Intake(mL/kg) 5612.5 (68.8) 1145 (14)   Urine (mL/kg/hr) 3715 (1.9) 600 (1.9)   Stool 1925 (1) 125 (0.4)   Total Output 5640 725   Net -27.5 +420          PHYSICAL EXAMINATION: General: ill appearing, lying in bed on vent. Neuro: awake and following commands. HEENT:  Trach site with mild bloody secretions Cardiovascular:  Tachycardic, regular.  No M/R/G. Lungs:  Coarse breath sounds throughout Abdomen:  Dressings in place.  Melanotic appearing drainage in ostomy bag.  BS hypoactive. Musculoskeletal:  2+ edema, non-pitting. Skin:  Intact, no rash  LABS:  CBC Recent Labs     08/27/13  0424  08/28/13  0413  08/28/13  1450  08/29/13  0320  WBC  26.1*  18.7*   --   22.7*  HGB  9.0*  6.7*  8.1*  5.7*  HCT  25.6*  18.3*  22.6*  15.8*  PLT  167  134*   --   127*    BMET Recent Labs     08/28/13  0413  08/28/13  1450  08/29/13  0320  NA  147  145  149*  K  2.9*  3.3*  3.4*  CL  112  109  111  CO2  19  21  22   BUN  95*  95*  91*  CREATININE  2.77*  2.68*  2.58*  GLUCOSE  166*  140*  225*    Electrolytes Recent Labs     08/27/13  0424  08/28/13  0413  08/28/13  1450  08/29/13  0320  CALCIUM  7.5*  7.1*  7.3*  7.1*  MG  1.6   --    --   1.5  PHOS   --   3.5  3.8  3.8    Liver Enzymes Recent Labs     08/28/13  0413  08/28/13  1450  08/29/13  0320  AST   --    --   20  ALT   --    --   17  ALKPHOS   --    --   79  BILITOT   --    --   1.5*  ALBUMIN  1.3*  1.5*  1.3*   Glucose Recent Labs     08/28/13  1145  08/28/13  1554  08/28/13  1930  08/28/13  2336  08/29/13  0331  08/29/13  0747  GLUCAP  163*  133*  153*  153*  204*   187*    Imaging Dg Chest Port 1 View  08/29/2013   CLINICAL DATA:  Airspace disease  EXAM: PORTABLE CHEST - 1 VIEW  COMPARISON:  DG CHEST 1V PORT dated 08/28/2013; DG CHEST 1V PORT dated 08/22/2013; DG CHEST 1V PORT dated 08/14/2013; DG CHEST 1V PORT dated 08/07/2013  FINDINGS: Grossly unchanged cardiac silhouette and mediastinal contours. Stable position of support apparatus. No pneumothorax. Pulmonary vasculature remains indistinct. Minimally improved aeration of the lungs with persistent right upper and bilateral lower lung heterogeneous opacities. Trace right-sided effusion is not excluded. Grossly unchanged bones.  IMPRESSION: 1.  Stable positioning of support apparatus.  No pneumothorax. 2. Minimally improved aeration of the lungs suggests minimally improved edema and/or atelectasis. 3. Residual bilateral airspace disease may represent residual alveolar pulmonary edema though underlying infection not excluded.   Electronically Signed   By: Sandi Mariscal M.D.   On: 08/29/2013 08:09   Dg Chest Port 1 View  08/28/2013   CLINICAL DATA:  Airspace disease  EXAM: PORTABLE CHEST - 1 VIEW  COMPARISON:  DG CHEST 1V PORT dated 08/26/2013; DG CHEST 1V PORT dated 08/23/2013; DG CHEST 1V PORT dated 08/22/2013  FINDINGS: Grossly unchanged cardiac silhouette and mediastinal contours. Stable position of support apparatus. A skin fold overlies the left upper lung. No definite pneumothorax. Improved aeration of the lungs with persistent bilateral perihilar predominant heterogeneous opacities with relative area of confluence within the right upper and left lower lungs. No new focal airspace opacities. No definite pleural effusion. Grossly unchanged bones.  IMPRESSION: 1.  Stable positioning of support apparatus.  No pneumothorax. 2. Minimally improved aeration of the lungs with persistent perihilar opacities which given apparent improvement is favored to represent improved alveolar pulmonary edema though note, underlying infection  and/or aspiration is not excluded. Continued attention on follow-up is recommended.   Electronically Signed   By: Sandi Mariscal M.D.   On: 08/28/2013 07:51    ASSESSMENT / PLAN:  PULMONARY A: Acute respiratory failure in setting of perforated bowel.  S/p Tracheostomy 1/22. Hemoptysis develop 2//09 >> ? From tracheal suctioning. HCAP developed 2/09. P: -continue vent support -limit trach suctioning -prn BD's -f/u CT chest >> depending  on findings might need bronchoscopy for airway inspection  CARDIOVASCULAR A: Septic shock from perforated bowel >> off pressors 2/02. Hx of A fib >> previously on amiodarone. P: -continue scheduled IV lopressor >> administration frequency increased 2/10  RENAL A: Acute kidney injury 2nd to ischemic bowel, shock >> started on HD January 2015 >> making urine. Anion gap metabolic acidosis. Anasarca Hypokalemia P: -negative fluid balance as tolerated >> lasix ordered by renal 2/12 -f/u and replace electrolytes as needed -HCO3 gtt d/c'ed by renal 2/12  GASTROINTESTINAL A: Perforated bowel. Protein calorie malnutrition. Melena develop 2/11 >> ?from anastamosis. P: -post-op care per CCS -advance diet per [facility]CS >> started tube feeds 2/09, tolerating ok. -protonix for SUP -CCS to assess whether angiography is an option to control bleeding  HEMATOLOGIC A: Anemia of critical illness and acute blood loss from GI source. P: -SCD for DVT prevention -f/u H/H  -transfuse for Hb < 7 or bleeding -f/u CBC in AM   INFECTIOUS A: Septic shock from bowel perforation >> improved. HCAP with Stenotrophomonas in sputum developed 2/09 >> developed rash from vancomycin. P: -change Abx to bactrim 2/12 -monitor WBC/fever curve  ENDOCRINE A: Hyperglycemia. P: -SSI  NEUROLOGIC A: Acute encephalopathy 2nd to sepsis >> improving. Deconditioning. P: -will need PT/OT at some point >> likely will need LTAC as he continues to improve.  Updated family at  bedside.  CC time 35 minutes.  Chesley Mires, MD [facility] Pulmonary/Critical Care 08/29/2013, 10:48 AM Pager:  [phone] After 3pm call: [phone]

## 2013-08-29 NOTE — Progress Notes (Signed)
I agree with the Student-Dietitian note and made appropriate revisions.  Katie Rosser [place], RD, LDN Pager #: [phone] After-Hours Pager #: [phone]  

## 2013-08-29 NOTE — Progress Notes (Signed)
Patient ID: Jason Harmon, male   DOB: Jan 06, 1941, 73 y.o.   MRN: [id number]  Subjective: Melanotic stools.  Hemoglobin dropped, receiving PRBCs.  Awake on ventilator.  Objective:  Vital signs:  Filed Vitals:   08/29/13 0800 08/29/13 0802 08/29/13 0805 08/29/13 0807  BP: 153/94 153/94  153/94  Pulse: 114 114 119 123  Temp:      TempSrc:      Resp: _0 Height:      Weight:      SpO2: 100% 99% 98% 96%    Last BM Date: 08/26/13  Intake/Output   Yesterday:  02/11 0701 - 02/12 0700 In: 5612.5 [I.V.:1800; Blood:662.5; NG/GT:240; IV Piggyback:1050; TPN:1680] Out: 8768 [Urine:3715; TLXBW:6203] This shift:  Total I/O In: 155 [I.V.:75; NG/GT:10; TPN:70] Out: 325 [Urine:200; Stool:125]   Physical Exam: General: Pt awake, mild distress Abdomen: Soft.  Nondistended. Midline wound is clean.  Ostomy with melanotic stool.    Problem List:   Active Problems:   Bowel perforation   Acute and chronic respiratory failure   Septic shock   Hemoptysis    Results:   Labs: Results for orders placed during the hospital encounter of 08/18/13 (from the past 48 hour(s))  POCT I-STAT 3, BLOOD GAS (G3+)     Status: Abnormal   Collection Time    08/27/13  9:16 AM      Result Value Ref Range   pH, Arterial 7.358  7.350 - 7.450   pCO2 arterial 25.6 (*) 35.0 - 45.0 mmHg   pO2, Arterial 68.0 (*) 80.0 - 100.0 mmHg   Bicarbonate 14.4 (*) 20.0 - 24.0 mEq/L   TCO2 15  0 - 100 mmol/L   O2 Saturation 93.0     Acid-base deficit 9.0 (*) 0.0 - 2.0 mmol/L   Patient temperature 98.8 F     Collection site RADIAL, ALLEN'S TEST ACCEPTABLE     Drawn by RT     Sample type ARTERIAL    LACTIC ACID, PLASMA     Status: None   Collection Time    08/27/13  9:58 AM      Result Value Ref Range   Lactic Acid, Venous 1.2  0.5 - 2.2 mmol/L  PROCALCITONIN     Status: None   Collection Time    08/27/13  9:58 AM      Result Value Ref Range   Procalcitonin 0.55     Comment:          Interpretation:     PCT > 0.5 ng/mL and <= 2 ng/mL:     Systemic infection (sepsis) is possible,     but other conditions are known to elevate     PCT as well.     (NOTE)             ICU PCT Algorithm               Non ICU PCT Algorithm        ----------------------------     ------------------------------             PCT < 0.25 ng/mL                 PCT < 0.1 ng/mL         Stopping of antibiotics            Stopping of antibiotics           strongly encouraged.  strongly encouraged.        ----------------------------     ------------------------------           PCT level decrease by               PCT < 0.25 ng/mL           >= 80% from peak PCT           OR PCT 0.25 - 0.5 ng/mL          Stopping of antibiotics                                                 encouraged.         Stopping of antibiotics               encouraged.        ----------------------------     ------------------------------           PCT level decrease by              PCT >= 0.25 ng/mL           < 80% from peak PCT            AND PCT >= 0.5 ng/mL            Continuing antibiotics                                                  encouraged.           Continuing antibiotics                encouraged.        ----------------------------     ------------------------------         PCT level increase compared          PCT > 0.5 ng/mL             with peak PCT AND              PCT >= 0.5 ng/mL             Escalation of antibiotics                                              strongly encouraged.          Escalation of antibiotics            strongly encouraged.  GLUCOSE, CAPILLARY     Status: Abnormal   Collection Time    08/27/13 11:52 AM      Result Value Ref Range   Glucose-Capillary 148 (*) 70 - 99 mg/dL   Comment 1 Notify RN    GLUCOSE, CAPILLARY     Status: Abnormal   Collection Time    08/27/13  3:38 PM      Result Value Ref Range   Glucose-Capillary 128 (*) 70 - 99 mg/dL   Comment 1  Notify RN    GLUCOSE, CAPILLARY     Status: Abnormal   Collection Time    08/27/13  7:21  PM      Result Value Ref Range   Glucose-Capillary 121 (*) 70 - 99 mg/dL   Comment 1 Documented in Chart     Comment 2 Notify RN    GLUCOSE, CAPILLARY     Status: Abnormal   Collection Time    08/27/13 11:38 PM      Result Value Ref Range   Glucose-Capillary 158 (*) 70 - 99 mg/dL   Comment 1 Documented in Chart     Comment 2 Notify RN    GLUCOSE, CAPILLARY     Status: Abnormal   Collection Time    08/28/13  3:41 AM      Result Value Ref Range   Glucose-Capillary 164 (*) 70 - 99 mg/dL   Comment 1 Documented in Chart     Comment 2 Notify RN    RENAL FUNCTION PANEL     Status: Abnormal   Collection Time    08/28/13  4:13 AM      Result Value Ref Range   Sodium 147  137 - 147 mEq/L   Potassium 2.9 (*) 3.7 - 5.3 mEq/L   Comment: CRITICAL RESULT CALLED TO, READ BACK BY AND VERIFIED WITH:     PARRISH J,RN 08/28/13 0507 WAYK   Chloride 112  96 - 112 mEq/L   CO2 19  19 - 32 mEq/L   Glucose, Bld 166 (*) 70 - 99 mg/dL   BUN 95 (*) 6 - 23 mg/dL   Creatinine, Ser 2.77 (*) 0.50 - 1.35 mg/dL   Calcium 7.1 (*) 8.4 - 10.5 mg/dL   Phosphorus 3.5  2.3 - 4.6 mg/dL   Albumin 1.3 (*) 3.5 - 5.2 g/dL   GFR calc non Af Amer 22 (*) >90 mL/min   GFR calc Af Amer 25 (*) >90 mL/min   Comment: (NOTE)     The eGFR has been calculated using the CKD EPI equation.     This calculation has not been validated in all clinical situations.     eGFR's persistently <90 mL/min signify possible Chronic Kidney     Disease.  PROCALCITONIN     Status: None   Collection Time    08/28/13  4:13 AM      Result Value Ref Range   Procalcitonin 0.44     Comment:            Interpretation:     PCT (Procalcitonin) <= 0.5 ng/mL:     Systemic infection (sepsis) is not likely.     Local bacterial infection is possible.     (NOTE)             ICU PCT Algorithm               Non ICU PCT Algorithm        ----------------------------      ------------------------------             PCT < 0.25 ng/mL                 PCT < 0.1 ng/mL         Stopping of antibiotics            Stopping of antibiotics           strongly encouraged.               strongly encouraged.        ----------------------------     ------------------------------  PCT level decrease by               PCT < 0.25 ng/mL           >= 80% from peak PCT           OR PCT 0.25 - 0.5 ng/mL          Stopping of antibiotics                                                 encouraged.         Stopping of antibiotics               encouraged.        ----------------------------     ------------------------------           PCT level decrease by              PCT >= 0.25 ng/mL           < 80% from peak PCT            AND PCT >= 0.5 ng/mL            Continuing antibiotics                                                  encouraged.           Continuing antibiotics                encouraged.        ----------------------------     ------------------------------         PCT level increase compared          PCT > 0.5 ng/mL             with peak PCT AND              PCT >= 0.5 ng/mL             Escalation of antibiotics                                              strongly encouraged.          Escalation of antibiotics            strongly encouraged.  CBC     Status: Abnormal   Collection Time    08/28/13  4:13 AM      Result Value Ref Range   WBC 18.7 (*) 4.0 - 10.5 K/uL   RBC 2.11 (*) 4.22 - 5.81 MIL/uL   Hemoglobin 6.7 (*) 13.0 - 17.0 g/dL   Comment: REPEATED TO VERIFY     CRITICAL RESULT CALLED TO, READ BACK BY AND VERIFIED WITH:     E. DAVIS RN [id number] GREEN R   HCT 18.3 (*) 39.0 - 52.0 %   MCV 86.7  78.0 - 100.0 fL   MCH 31.8  26.0 - 34.0 pg   MCHC 36.6 (*) 30.0 - 36.0 g/dL   RDW 14.9  11.5 - 15.5 %   Platelets 134 (*)  150 - 400 K/uL  PREPARE RBC (CROSSMATCH)     Status: None   Collection Time    08/28/13  5:30 AM      Result Value Ref Range    Order Confirmation ORDER PROCESSED BY BLOOD BANK    GLUCOSE, CAPILLARY     Status: Abnormal   Collection Time    08/28/13  7:36 AM      Result Value Ref Range   Glucose-Capillary 111 (*) 70 - 99 mg/dL  PROTIME-INR     Status: Abnormal   Collection Time    08/28/13 11:30 AM      Result Value Ref Range   Prothrombin Time 16.5 (*) 11.6 - 15.2 seconds   INR 1.37  0.00 - 1.49  GLUCOSE, CAPILLARY     Status: Abnormal   Collection Time    08/28/13 11:45 AM      Result Value Ref Range   Glucose-Capillary 163 (*) 70 - 99 mg/dL  RENAL FUNCTION PANEL     Status: Abnormal   Collection Time    08/28/13  2:50 PM      Result Value Ref Range   Sodium 145  137 - 147 mEq/L   Potassium 3.3 (*) 3.7 - 5.3 mEq/L   Chloride 109  96 - 112 mEq/L   CO2 21  19 - 32 mEq/L   Glucose, Bld 140 (*) 70 - 99 mg/dL   BUN 95 (*) 6 - 23 mg/dL   Creatinine, Ser 2.68 (*) 0.50 - 1.35 mg/dL   Calcium 7.3 (*) 8.4 - 10.5 mg/dL   Phosphorus 3.8  2.3 - 4.6 mg/dL   Albumin 1.5 (*) 3.5 - 5.2 g/dL   GFR calc non Af Amer 23 (*) >90 mL/min   GFR calc Af Amer 26 (*) >90 mL/min   Comment: (NOTE)     The eGFR has been calculated using the CKD EPI equation.     This calculation has not been validated in all clinical situations.     eGFR's persistently <90 mL/min signify possible Chronic Kidney     Disease.  HEMOGLOBIN AND HEMATOCRIT, BLOOD     Status: Abnormal   Collection Time    08/28/13  2:50 PM      Result Value Ref Range   Hemoglobin 8.1 (*) 13.0 - 17.0 g/dL   Comment: POST TRANSFUSION SPECIMEN   HCT 22.6 (*) 39.0 - 52.0 %  GLUCOSE, CAPILLARY     Status: Abnormal   Collection Time    08/28/13  3:54 PM      Result Value Ref Range   Glucose-Capillary 133 (*) 70 - 99 mg/dL  GLUCOSE, CAPILLARY     Status: Abnormal   Collection Time    08/28/13  7:30 PM      Result Value Ref Range   Glucose-Capillary 153 (*) 70 - 99 mg/dL   Comment 1 Documented in Chart     Comment 2 Notify RN    GLUCOSE, CAPILLARY     Status:  Abnormal   Collection Time    08/28/13 11:36 PM      Result Value Ref Range   Glucose-Capillary 153 (*) 70 - 99 mg/dL   Comment 1 Documented in Chart     Comment 2 Notify RN    PROCALCITONIN     Status: None   Collection Time    08/29/13  3:20 AM      Result Value Ref Range   Procalcitonin 0.69     Comment:  Interpretation:     PCT > 0.5 ng/mL and <= 2 ng/mL:     Systemic infection (sepsis) is possible,     but other conditions are known to elevate     PCT as well.     (NOTE)             ICU PCT Algorithm               Non ICU PCT Algorithm        ----------------------------     ------------------------------             PCT < 0.25 ng/mL                 PCT < 0.1 ng/mL         Stopping of antibiotics            Stopping of antibiotics           strongly encouraged.               strongly encouraged.        ----------------------------     ------------------------------           PCT level decrease by               PCT < 0.25 ng/mL           >= 80% from peak PCT           OR PCT 0.25 - 0.5 ng/mL          Stopping of antibiotics                                                 encouraged.         Stopping of antibiotics               encouraged.        ----------------------------     ------------------------------           PCT level decrease by              PCT >= 0.25 ng/mL           < 80% from peak PCT            AND PCT >= 0.5 ng/mL            Continuing antibiotics                                                  encouraged.           Continuing antibiotics                encouraged.        ----------------------------     ------------------------------         PCT level increase compared          PCT > 0.5 ng/mL             with peak PCT AND              PCT >= 0.5 ng/mL             Escalation of antibiotics  strongly encouraged.          Escalation of antibiotics            strongly encouraged.  CBC     Status:  Abnormal   Collection Time    08/29/13  3:20 AM      Result Value Ref Range   WBC 22.7 (*) 4.0 - 10.5 K/uL   RBC 1.79 (*) 4.22 - 5.81 MIL/uL   Hemoglobin 5.7 (*) 13.0 - 17.0 g/dL   Comment: DELTA CHECK NOTED     REPEATED TO VERIFY     CRITICAL RESULT CALLED TO, READ BACK BY AND VERIFIED WITH:     A. [facility] (RN) [phone] 08/29/2013 L. LOMAX   HCT 15.8 (*) 39.0 - 52.0 %   MCV 88.3  78.0 - 100.0 fL   MCH 31.8  26.0 - 34.0 pg   MCHC 36.1 (*) 30.0 - 36.0 g/dL   RDW 14.9  11.5 - 15.5 %   Platelets 127 (*) 150 - 400 K/uL  COMPREHENSIVE METABOLIC PANEL     Status: Abnormal   Collection Time    08/29/13  3:20 AM      Result Value Ref Range   Sodium 149 (*) 137 - 147 mEq/L   Potassium 3.4 (*) 3.7 - 5.3 mEq/L   Chloride 111  96 - 112 mEq/L   CO2 22  19 - 32 mEq/L   Glucose, Bld 225 (*) 70 - 99 mg/dL   BUN 91 (*) 6 - 23 mg/dL   Creatinine, Ser 2.58 (*) 0.50 - 1.35 mg/dL   Calcium 7.1 (*) 8.4 - 10.5 mg/dL   Total Protein 4.2 (*) 6.0 - 8.3 g/dL   Albumin 1.3 (*) 3.5 - 5.2 g/dL   AST 20  0 - 37 U/L   ALT 17  0 - 53 U/L   Alkaline Phosphatase 79  39 - 117 U/L   Total Bilirubin 1.5 (*) 0.3 - 1.2 mg/dL   GFR calc non Af Amer 24 (*) >90 mL/min   GFR calc Af Amer 27 (*) >90 mL/min   Comment: (NOTE)     The eGFR has been calculated using the CKD EPI equation.     This calculation has not been validated in all clinical situations.     eGFR's persistently <90 mL/min signify possible Chronic Kidney     Disease.  MAGNESIUM     Status: None   Collection Time    08/29/13  3:20 AM      Result Value Ref Range   Magnesium 1.5  1.5 - 2.5 mg/dL  PHOSPHORUS     Status: None   Collection Time    08/29/13  3:20 AM      Result Value Ref Range   Phosphorus 3.8  2.3 - 4.6 mg/dL  GLUCOSE, CAPILLARY     Status: Abnormal   Collection Time    08/29/13  3:31 AM      Result Value Ref Range   Glucose-Capillary 204 (*) 70 - 99 mg/dL   Comment 1 Documented in Chart     Comment 2 Notify RN    PREPARE RBC  (CROSSMATCH)     Status: None   Collection Time    08/29/13  4:30 AM      Result Value Ref Range   Order Confirmation ORDER PROCESSED BY BLOOD BANK    GLUCOSE, CAPILLARY     Status: Abnormal   Collection Time    08/29/13  7:47 AM  Result Value Ref Range   Glucose-Capillary 187 (*) 70 - 99 mg/dL   Comment 1 Documented in Chart     Comment 2 Notify RN      Imaging / Studies: Dg Chest Port 1 View  08/29/2013   CLINICAL DATA:  Airspace disease  EXAM: PORTABLE CHEST - 1 VIEW  COMPARISON:  DG CHEST 1V PORT dated 08/28/2013; DG CHEST 1V PORT dated 08/22/2013; DG CHEST 1V PORT dated 08/14/2013; DG CHEST 1V PORT dated 08/07/2013  FINDINGS: Grossly unchanged cardiac silhouette and mediastinal contours. Stable position of support apparatus. No pneumothorax. Pulmonary vasculature remains indistinct. Minimally improved aeration of the lungs with persistent right upper and bilateral lower lung heterogeneous opacities. Trace right-sided effusion is not excluded. Grossly unchanged bones.  IMPRESSION: 1.  Stable positioning of support apparatus.  No pneumothorax. 2. Minimally improved aeration of the lungs suggests minimally improved edema and/or atelectasis. 3. Residual bilateral airspace disease may represent residual alveolar pulmonary edema though underlying infection not excluded.   Electronically Signed   By: Sandi Mariscal M.D.   On: 08/29/2013 08:09   Dg Chest Port 1 View  08/28/2013   CLINICAL DATA:  Airspace disease  EXAM: PORTABLE CHEST - 1 VIEW  COMPARISON:  DG CHEST 1V PORT dated 08/26/2013; DG CHEST 1V PORT dated 08/23/2013; DG CHEST 1V PORT dated 08/22/2013  FINDINGS: Grossly unchanged cardiac silhouette and mediastinal contours. Stable position of support apparatus. A skin fold overlies the left upper lung. No definite pneumothorax. Improved aeration of the lungs with persistent bilateral perihilar predominant heterogeneous opacities with relative area of confluence within the right upper and left lower  lungs. No new focal airspace opacities. No definite pleural effusion. Grossly unchanged bones.  IMPRESSION: 1.  Stable positioning of support apparatus.  No pneumothorax. 2. Minimally improved aeration of the lungs with persistent perihilar opacities which given apparent improvement is favored to represent improved alveolar pulmonary edema though note, underlying infection and/or aspiration is not excluded. Continued attention on follow-up is recommended.   Electronically Signed   By: Sandi Mariscal M.D.   On: 08/28/2013 07:51    Medications / Allergies: per chart  Antibiotics: Anti-infectives   Start     Dose/Rate Route Frequency Ordered Stop   08/28/13 1900  vancomycin (VANCOCIN) 1,250 mg in sodium chloride 0.9 % 250 mL IVPB  Status:  Discontinued     1,250 mg 166.7 mL/hr over 90 Minutes Intravenous Every 48 hours 08/26/13 1758 08/27/13 0900   08/27/13 0930  linezolid (ZYVOX) IVPB 600 mg     600 mg 300 mL/hr over 60 Minutes Intravenous Every 12 hours 08/27/13 0900     08/26/13 1900  vancomycin (VANCOCIN) 1,750 mg in sodium chloride 0.9 % 500 mL IVPB     1,750 mg 250 mL/hr over 120 Minutes Intravenous  Once 08/26/13 1758 08/26/13 2044   08/18/13 1400  piperacillin-tazobactam (ZOSYN) IVPB 2.25 g     2.25 g 100 mL/hr over 30 Minutes Intravenous 3 times per day 08/18/13 1124     08/18/13 0500  piperacillin-tazobactam (ZOSYN) IVPB 3.375 g  Status:  Discontinued     3.375 g 12.5 mL/hr over 240 Minutes Intravenous 3 times per day 08/18/13 0434 08/18/13 1124      Assessment/Plan  1.Pod 11/9 reexploration with small bowel anastmosis, colostomy and g tube  2. VDRF, intermittent trach collar vs vent  3. Anemia, ABL secondary to melena from GI tract  4. PCM/TNA/TF  We will need to discuss further treatment plans with family  today.  Will consider angio assuming the bleeding is coming from the anastomosis site.  Alternative includes surgery, however, he would be a high risk surgical candidate.  Will  discuss further with Dr. Grandville Silos and family.    Erby Pian, [facility] Pager [phone] Office [phone]  08/29/2013 8:29 AM

## 2013-08-29 NOTE — Progress Notes (Addendum)
NUTRITION FOLLOW UP  Intervention:   -TPN per pharmacy  -Continue Vital 1.5 formula at 10 ml/hr, advance as medically appropriate  Nutrition Dx:   Inadequate oral intake related to inability to eat as evidenced by NPO status, ongoing  Goal:   Pt to meet >/= 90% of their estimated nutrition needs, met  Monitor:   EN regimen & tolerance, TPN prescription, respiratory status, weight, labs, I/O's  Assessment:   73 year old male with SBO and ischemic bowel s/p resection at [facility]. Was transferred to [facility] for continuing hemodialysis and inability to wean from ventilator. Admitted on 2/1 after he developed a new bowel perforation at his anastomosis site and is s/p ex lap with small bowel resection and wound vac placement. S/P trach on 1/22.   Patient s/p procedures 2/3:  SMALL BOWEL ANASTOMOSIS  STAMM GASTROSTOMY  END SIGMOID COLOSTOMY   2/9-Patient currently off ventilatory support -- on trach collar.  RD consulted for TF initiation & management.  Vital High Protein formula currently infusing at 20 ml/hr via G-tube (clamped) providing 480 kcals, 42 gm protein -- tolerating.  Patient is receiving TPN with Clinimix 5/15 @ 60 ml/hr and lipids @ 10 ml/hr. Provides 1502 kcal and 72 grams protein per day.  Current TF regimen & TPN prescription is meeting 95% minimum estimated energy needs and 95% minimum estimated protein needs.  Noted likely will need LTACH placement.  2/12- Pt is currently on ventilatory support -- trach MV: 18.9 Temp (24hrs), Avg:99.2 F (37.3 C), Min:97.5 F (36.4 C), Max:100.2 F (37.9 C)   Patient placed back on vent support 2/10.    Vital 1.5 formula currently infusing at 10 ml/hr with Prostat liquid protein 30 ml TID via G-tube providing 660 kcals, 61 gm protein, 183 ml of free water.  Per RN, tolerating at trickle rate.  Noted patient vomited moderate amount of tube feeding 2/10, 1236 AM (question if formula was increased slowly every 8 hours to goal rate).    Patient now with GI bleed, Surgery suspecting from anastomosis.  Patient continues to receive TPN with Clinimix 5/15 @ 60 ml/hr and lipids @ 10 ml/hr. Provides 1502 kcal and 72 grams protein per day.  Current TF regimen & TPN prescription is meeting 98% minimum estimated energy needs and 111% minimum estimated protein needs.   Height: Ht Readings from Last 1 Encounters:  08/18/13 $RemoveB'5\' 11"'NKEjnlNs$  (1.803 m)    Weight Status:   Wt Readings from Last 1 Encounters:  08/29/13 179 lb 14.3 oz (81.6 kg)    Re-estimated needs:  Kcal: 2200-2400 Protein: 120-135 gm Fluid: 2.2-2.4 L  Skin: stage 1 sacral wound; wound VAC to surgical abdominal wound  Diet Order: NPO   Intake/Output Summary (Last 24 hours) at 08/29/13 1504 Last data filed at 08/29/13 1400  Gross per 24 hour  Intake 5152.5 ml  Output   5565 ml  Net -412.5 ml    Last BM: 2/9-watery  Labs:   Recent Labs Lab 08/26/13 0347 08/27/13 0424 08/28/13 0413 08/28/13 1450 08/29/13 0320  NA 144 149* 147 145 149*  K 3.2* 3.2* 2.9* 3.3* 3.4*  CL 114* 116* 112 109 111  CO2 12* 15* $Remov'19 21 22  'AwCaIZ$ BUN 107* 103* 95* 95* 91*  CREATININE 3.08* 2.87* 2.77* 2.68* 2.58*  CALCIUM 7.4* 7.5* 7.1* 7.3* 7.1*  MG 1.5 1.6  --   --  1.5  PHOS 3.5  --  3.5 3.8 3.8  GLUCOSE 73 135* 166* 140* 225*    CBG (  last 3)   Recent Labs  08/29/13 0331 08/29/13 0747 08/29/13 1225  GLUCAP 204* 187* 180*    Scheduled Meds: . antiseptic oral rinse  15 mL Mouth Rinse QID  . chlorhexidine  15 mL Mouth Rinse BID  . darbepoetin (ARANESP) injection - NON-DIALYSIS  100 mcg Subcutaneous Q Mon-1800  . feeding supplement (PRO-STAT SUGAR FREE 64)  30 mL Per Tube TID  . furosemide  80 mg Intravenous Q12H  . insulin aspart  0-15 Units Subcutaneous 6 times per day  . metoprolol  5 mg Intravenous Q4H  . pantoprazole (PROTONIX) IV  40 mg Intravenous Q12H  . sulfamethoxazole-trimethoprim  400 mg of trimethoprim Intravenous Q12H    Continuous Infusions: . Marland KitchenTPN  (CLINIMIX-E) Adult 60 mL/hr at 08/28/13 2200   And  . fat emulsion 240 mL (08/28/13 2200)  . Marland KitchenTPN (CLINIMIX-E) Adult     And  . fat emulsion    . feeding supplement (VITAL 1.5 CAL) 1,000 mL (08/27/13 0045)    Kallie Locks Dietetic Intern Pager: [phone]

## 2013-08-29 NOTE — Progress Notes (Signed)
PARENTERAL NUTRITION CONSULT NOTE - FOLLOW UP  Pharmacy Consult for TPN Indication: perforated viscous  No Known Allergies  Patient Measurements: Height: 5\' 11"  (180.3 cm) (5'11") Weight: 179 lb 14.3 oz (81.6 kg) IBW/kg (Calculated) : 75.3 Weight at [facility] 89.3 kg  Vital Signs: Temp: 98.5 F (36.9 C) (02/12 0750) Temp src: Axillary (02/12 0750) BP: 154/81 mmHg (02/12 0824) Pulse Rate: 129 (02/12 0824) Intake/Output from previous day: 02/11 0701 - 02/12 0700 In: 5612.5 [I.V.:1800; Blood:662.5; NG/GT:240; IV Piggyback:1050; TPN:1680] Out: 1937 [Urine:3715; TKWIO:9735] Intake/Output from this shift: Total I/O In: 155 [I.V.:75; NG/GT:10; TPN:70] Out: 325 [Urine:200; Stool:125]  Labs:  Recent Labs  08/26/13 1215  08/27/13 0424 08/28/13 0413 08/28/13 1130 08/28/13 1450 08/29/13 0320  WBC  --   < > 26.1* 18.7*  --   --  22.7*  HGB  --   < > 9.0* 6.7*  --  8.1* 5.7*  HCT  --   < > 25.6* 18.3*  --  22.6* 15.8*  PLT  --   < > 167 134*  --   --  127*  APTT 37  --   --   --   --   --   --   INR 1.40  --   --   --  1.37  --   --   < > = values in this interval not displayed.   Recent Labs  08/27/13 0424 08/28/13 0413 08/28/13 1450 08/29/13 0320  NA 149* 147 145 149*  K 3.2* 2.9* 3.3* 3.4*  CL 116* 112 109 111  CO2 15* 19 21 22   GLUCOSE 135* 166* 140* 225*  BUN 103* 95* 95* 91*  CREATININE 2.87* 2.77* 2.68* 2.58*  CALCIUM 7.5* 7.1* 7.3* 7.1*  MG 1.6  --   --  1.5  PHOS  --  3.5 3.8 3.8  PROT  --   --   --  4.2*  ALBUMIN  --  1.3* 1.5* 1.3*  AST  --   --   --  20  ALT  --   --   --  17  ALKPHOS  --   --   --  79  BILITOT  --   --   --  1.5*   Estimated Creatinine Clearance: 28.4 ml/min (by C-G formula based on Cr of 2.58).    Recent Labs  08/28/13 2336 08/29/13 0331 08/29/13 0747  GLUCAP 153* 204* 187*   Insulin Requirements in the past 24 hours:  19 units SSI  Current Nutrition:  -Clinimix E 5/15 at 60 ml/hr + lipids at 10 ml/hr- 72 gm of protein and  1584 kcals -Vital 1.5 at 62mL/hr + Prostat 48mL TID- provides 61g protein and 660kcal (Goal rate is 53mL/hr with Prostat liquid 3mL PT TID. This will provide 134g protein and 2280 kcal)  Nutritional Goals: 2100-2300 kCal, 120-135 grams of protein per day per RD recommendations 2/50  Admit: 73 year old male with SBO and ischemic bowel s/p resection at [facility].  Was transferred to [facility] for continuing hemodialysis and inability to wean from ventilator.  developed a new bowel perforation at his anastomosis site and is s/p ex lap (2/1) with small bowel resection and wound vac placement. Now with melena in ostomy- surgery considering angio. Surgery may be needed though he is high risk  GI: new bowel perforation s/p ex lap; was on TPN at [facility].  To OR 2/3  for small bowel anastomosis, colostomy, gastrostomy tube.   Nutrition: TF started 2/9-  pt had emesis when TF turned up to 58mL/hr on 2/9- rate was turned down to 44mL/hr which has continued- tolerating with minimal residuals. Unable to advance as pt with frank melena which has required transfusion  Endo: CBGs were well controlled, increase in amount of SSI needed in the last 24 hours- CBGs/24h 111-225  Lytes: K 3.4, Na 149, Phos 3.8, Mag 1.5, CorCa ~9.3    Renal: renal function recovering- hasn't had HD since 2/2, but only tolerated 1 hour d/t tachycardia and decreased BP. Creat 2.58, BUN 91- both continue to improve; UOP 1.9 ml/kg/hr. Sodium bicarb drip for metabolic acidosis per renal.  Hepatobil: LFTs WNL except albumin remains low at 1.3 and Tbili now elevated at 1.5; trig 146; prealbumin improved to 13.2-still below normal limits  Heme: had some bloody trach secretions and stopped subQ heparin 2/9- has needed a few transfusions since as with melena in ostomy- another 2 units PRBC ordered by ELink this morning for hgb 5.7. Platelets also fell to 127.  ID: Abx at [facility]: Flagyl 1/21>>2/1, Meropenem 1/24>>2/1 Zosyn 2/1>> (CCM noted stated 7 days  total, today is day #11) Vanc 2/9>>2/10 Linezolid 2/10>> 2/9 trach aspirate: mod Gm + rods WBC 22.7, Tmax 100.2   TPN Access: CVC   Plan:  1. TF to continue at 46mL/hr today + Prostat 16mL TID (61g protein + 660kcal) 2. Continue Clinimix E 5/15 + full multivitamin and trace elements- will continue at same rate of 60 ml/hr + 20% lipids at 10 ml/hr. This provides 72 gm of protein and 1584 kcals. Combined with TF, providing 100% of nutritional needs 3. As Lantus was discontinued and CBGs are above goal, will add 12 units of regular insulin to TPN which will be hung at 1800. Continue SSI q4h. 4. KCl 51mEq IV runs x4 per Renal and magnesium sulfate 2g IV x1 per Rx for repletion of electrolytes 5. TPN labs as ordered- BMET and Magnesium for tomorrow morning 6. Follow for ability to increase TF rate after GI bleeding resolved and wean off TPN  Ellyse Rotolo D. Edwing Figley, PharmD, BCPS Clinical Pharmacist Pager: [phone] 08/29/2013 8:28 AM

## 2013-08-29 NOTE — Progress Notes (Signed)
Admit: 08/18/2013 LOS: 12  70M with dialysis dependent prolonged AKI- but no HD since 2/2, with chronic ventilatory failure, small bowel perforation (repaired 08/20/13), abdominal septic shock.    Subjective:  Stable to improved renal function- great UOP-  Continuing to bleed, hgb 5.7 this AM Having high TF residuals Had to go back on full vent support   02/11 0701 - 02/12 0700 In: 5612.5 [I.V.:1800; Blood:662.5; NG/GT:240; IV Piggyback:1050; TPN:1680] Out: 9678 [Urine:3715; LFYBO:1751]  Filed Weights   08/27/13 0456 08/28/13 0448 08/29/13 0447  Weight: 81.1 kg (178 lb 12.7 oz) 84.3 kg (185 lb 13.6 oz) 81.6 kg (179 lb 14.3 oz)    Current meds: reviewed Current Labs: reviewed   Physical Exam:  Blood pressure 154/81, pulse 129, temperature 98.5 F (36.9 C), temperature source Axillary, resp. rate 30, height 5\' 11"  (1.803 m), weight 81.6 kg (179 lb 14.3 oz), SpO2 96.00%. GEN: chronically ill appearing, Trach, more awake and alert but no commands- struggling to breath even on vent ENT: Trach - HD cath out EYES: EOMI. Eyes open to noise, stimullus  CV: tachy, regular  PULM: +Bs b/l  ABD: wound vac at midline  SKIN: large ventral abd incision  EXT: 2+ pitting edema b/l  Assessment/Plan 1. Prolonged Dialysis Dependent AKI: Appears at this time to be liberated from RRT with sufficient but low GFR and adequate UOP.  No HD since 2/2 (which was abandoned early 2/2 hemodynamin instability).  Creatinine seeming to trend down. Cont to follow closely. OK with judicious diuretics to assist with weaning as needed.  Will add diuretics even though is making plenty of urine- is getting nearly daily transfusions and is more in than out 2. Anemia: transfusions per CCM and surgery.  Have added ESA- now also ?bleeding- off blood thinners 3. Septic shock and SB performation: per surgery and CCM. WBC climbing again- redman from vanc    Mild metabolic acidosis: improved, will stop bicarb drip for now to keep  ins down 5. Dispo- has only been Harmon month since he was in "good health" according to wife.  Feel like she would do whatever it takes at this time.  Fortunately, dialysis not needed for now.  6. Hypokalemia- gentle repletion- will give another 40 today - getting better 7. Hypertension- maybe due to positive fluid balance mostly due to blood- have increased lopressor  Jason Harmon   08/29/2013, 8:38 AM   Recent Labs Lab 08/28/13 0413 08/28/13 1450 08/29/13 0320  NA 147 145 149*  K 2.9* 3.3* 3.4*  CL 112 109 111  CO2 19 21 22   GLUCOSE 166* 140* 225*  BUN 95* 95* 91*  CREATININE 2.77* 2.68* 2.58*  CALCIUM 7.1* 7.3* 7.1*  PHOS 3.5 3.8 3.8    Recent Labs Lab 08/27/13 0424 08/28/13 0413 08/28/13 1450 08/29/13 0320  WBC 26.1* 18.7*  --  22.7*  HGB 9.0* 6.7* 8.1* 5.7*  HCT 25.6* 18.3* 22.6* 15.8*  MCV 87.1 86.7  --  88.3  PLT 167 134*  --  127*

## 2013-08-29 NOTE — Progress Notes (Signed)
Upon entering room patient had coughed and began to desat to 81%.  Changed inner cannula and suctioned patient.  Suctioned out bright red blood from trach.  Changed HME and ballard and increased FIO2.  RT will continue to monitor.

## 2013-08-29 NOTE — Consult Note (Signed)
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   Microbiology: Recent Results (from the past 720 hour(s))  CULTURE, BLOOD (ROUTINE X 2)     Status: None   Collection Time    08/07/13 10:00 PM      Result Value Ref Range Status   Specimen Description BLOOD ARTERIAL   Final   Special Requests BOTTLES DRAWN AEROBIC ONLY 5CC   Final   Culture  Setup Time     Final   Value: 08/08/2013 00:45     Performed at [facility]   Culture     Final   Value: NO GROWTH 5 DAYS     Performed at [facility]   Report Status 08/14/2013 FINAL   Final  CULTURE, BLOOD (ROUTINE X 2)     Status: None   Collection Time    08/07/13 10:10 PM      Result Value Ref Range Status   Specimen Description BLOOD LEFT HAND   Final   Special Requests BOTTLES DRAWN AEROBIC ONLY 2CC   Final   Culture  Setup Time     Final   Value: 08/08/2013 00:45     Performed at [facility]   Culture     Final   Value: NO GROWTH 5 DAYS     Performed at [facility]   Report Status 08/14/2013 FINAL   Final  CULTURE, BLOOD (ROUTINE X 2)     Status: None    Collection Time    08/17/13 11:25 AM      Result Value Ref Range Status   Specimen Description BLOOD RIGHT HAND   Final   Special Requests BOTTLES DRAWN AEROBIC ONLY [facility]   Final   Culture  Setup Time     Final   Value: 08/17/2013 16:41     Performed at [facility]   Culture     Final   Value: NO GROWTH 5 DAYS     Performed at [facility]   Report Status 08/23/2013 FINAL   Final  CULTURE, BLOOD (ROUTINE X 2)     Status: None   Collection Time    08/17/13 11:30 AM      Result Value Ref Range Status   Specimen Description BLOOD LEFT HAND   Final   Special Requests BOTTLES DRAWN AEROBIC ONLY 4CC   Final   Culture  Setup Time     Final   Value: 08/17/2013 16:41     Performed at [facility]  Final   Value: NO GROWTH 5 DAYS     Performed at [facility]   Report Status 08/23/2013 FINAL   Final  MRSA PCR SCREENING     Status: None   Collection Time    08/18/13 10:05 AM      Result Value Ref Range Status   MRSA by PCR NEGATIVE  NEGATIVE Final   Comment:            The GeneXpert MRSA Assay (FDA     approved for NASAL specimens     only), is one component of a     comprehensive MRSA colonization     surveillance program. It is not     intended to diagnose MRSA     infection nor to guide or     monitor treatment for     MRSA infections.  CULTURE, RESPIRATORY (NON-EXPECTORATED)     Status: None   Collection Time    08/26/13  5:48 PM      Result Value Ref Range Status   Specimen Description TRACHEAL ASPIRATE   Final   Special Requests NONE   Final   Gram Stain     Final   Value: ABUNDANT WBC PRESENT, PREDOMINANTLY PMN     MODERATE SQUAMOUS EPITHELIAL CELLS PRESENT     RARE GRAM POSITIVE COCCI     IN PAIRS     Performed at [facility]   Culture     Final   Value: MODERATE STENOTROPHOMONAS [place]     Performed at [facility]   Report Status 08/29/2013 FINAL   Final   Organism ID, Bacteria  STENOTROPHOMONAS MALTOPHILIA   Final    Medical/Surgical History: Past Medical History  Diagnosis Date  . Renal disorder   . A-fib   . Sepsis   . Ischemic bowel disease   . Acute renal failure    Past Surgical History  Procedure Laterality Date  . Abdominal surgery    . Laparotomy N/A 08/18/2013    Procedure: EXPLORATORY LAPAROTOMY;  Surgeon: Joyice Faster. Cornett, MD;  Location: [place];  Service: General;  Laterality: N/A;  . Laparotomy N/A 08/20/2013    Procedure: EXPLORATORY LAPAROTOMY WITH  SMALL BOWEL RESECTION;  Surgeon: Rolm Bookbinder, MD;  Location: [place];  Service: General;  Laterality: N/A;  . Gastrostomy N/A 08/20/2013    Procedure: G-TUBE PLACEMENT;  Surgeon: Rolm Bookbinder, MD;  Location: [place];  Service: General;  Laterality: N/A;  . Colostomy N/A 08/20/2013    Procedure: COLOSTOMY;  Surgeon: Rolm Bookbinder, MD;  Location: [place];  Service: General;  Laterality: N/A;   Current Medication[s] Include:  Scheduled:  Scheduled:  . antiseptic oral rinse  15 mL Mouth Rinse QID  . chlorhexidine  15 mL Mouth Rinse BID  . darbepoetin (ARANESP) injection - NON-DIALYSIS  100 mcg Subcutaneous Q Mon-1800  . feeding supplement (PRO-STAT SUGAR FREE 64)  30 mL Per Tube TID  . furosemide  80 mg Intravenous Q12H  . insulin aspart  0-15 Units Subcutaneous 6 times per day  . metoprolol  5 mg Intravenous Q4H  . pantoprazole (PROTONIX) IV  40 mg Intravenous Q12H  . potassium chloride  10 mEq Intravenous Q1 Hr x 4  . sulfamethoxazole-trimethoprim  400 mg of trimethoprim Intravenous Q12H    Infusion[s]: Infusions:  . Marland KitchenTPN (CLINIMIX-E) Adult 60 mL/hr at 08/28/13 2200   And  . fat emulsion 240 mL (08/28/13 2200)  . Marland KitchenTPN (CLINIMIX-E) Adult     And  .  fat emulsion    . feeding supplement (VITAL 1.5 CAL) 1,000 mL (08/27/13 0045)    Antibiotic[s]: Anti-infectives   Start     Dose/Rate Route Frequency Ordered Stop   08/29/13 1200  sulfamethoxazole-trimethoprim (BACTRIM) 400 mg of  trimethoprim in dextrose 5 % 500 mL IVPB     400 mg of trimethoprim 350 mL/hr over 90 Minutes Intravenous Every 12 hours 08/29/13 1149     08/28/13 1900  vancomycin (VANCOCIN) 1,250 mg in sodium chloride 0.9 % 250 mL IVPB  Status:  Discontinued     1,250 mg 166.7 mL/hr over 90 Minutes Intravenous Every 48 hours 08/26/13 1758 08/27/13 0900   08/27/13 0930  linezolid (ZYVOX) IVPB 600 mg  Status:  Discontinued     600 mg 300 mL/hr over 60 Minutes Intravenous Every 12 hours 08/27/13 0900 08/29/13 1057   08/26/13 1900  vancomycin (VANCOCIN) 1,750 mg in sodium chloride 0.9 % 500 mL IVPB     1,750 mg 250 mL/hr over 120 Minutes Intravenous  Once 08/26/13 1758 08/26/13 2044   08/18/13 1400  piperacillin-tazobactam (ZOSYN) IVPB 2.25 g  Status:  Discontinued     2.25 g 100 mL/hr over 30 Minutes Intravenous 3 times per day 08/18/13 1124 08/29/13 1057   08/18/13 0500  piperacillin-tazobactam (ZOSYN) IVPB 3.375 g  Status:  Discontinued     3.375 g 12.5 mL/hr over 240 Minutes Intravenous 3 times per day 08/18/13 0434 08/18/13 1124      Assessment:  73 yo male with SBO/ischemic bowel, bowel perf. s/p resection w/complicated medical course including respiratory failure, trach, and inability to wean from ventilator  Patient has been on Vanc, Zosyn, and Zyvox for septic shock.  Trach aspirate reported as Stenotrophomonas Maltophilia sensitive to Septra.  Septra has been ordered per protocol.  Patient's renal function currently with est. CrCl < 30 ml/min.  Scr 2.6.   Goal of Therapy:   Eradication of HCAP infection. Septra has been selected for infection/cultures and adjusted for renal function.   Plan:  1. Begin Septra 400 mg [trimethoprim component ~ 5mg /kg/dose] IV q 12 hours. 2. Follow up SCr, UOP, cultures, clinical course and adjust as clinically indicated.  Giani Betzold, Craig Guess,  Pharm.D.,    2/12/201511:51 AM

## 2013-08-29 NOTE — Progress Notes (Signed)
CRITICAL VALUE ALERT  Critical value received:  Hgb 5.7  Date of notification:  08/29/2013  Time of notification:  0340  Critical value read back: yes  Nurse who received alert:  Lilyan Gilford  MD notified (1st page):  ELINK  Time of first page:  0342  Notified [facility] RN. Stated the information would be passed along to MD and orders would be provided. No new orders at this time. Allen Derry, RN

## 2013-08-29 NOTE — Progress Notes (Signed)
RN called me to bedside d/t pt desat.  Sats 81% upon entering room.  Pt agitated, tachypnea, increased HR.  Placed pt back on vent full support.

## 2013-08-29 NOTE — Progress Notes (Signed)
eLink Physician-Brief Progress Note Patient Name: Jason Harmon DOB: 1941-01-21 MRN: [id number]  Date of Service  08/29/2013   HPI/Events of Note  Patient with continued issues of blood loss s/p transfusion in AM with Hgb up to 8.1 at 1450 now with Hgb drop to 5.7.  INR 1.37 at 1130.   eICU Interventions  Plan: Transfuse 2 units of pRBC  Post-transfusion CBC   Intervention Category Intermediate Interventions: Bleeding - evaluation and treatment with blood products  DETERDING,ELIZABETH 08/29/2013, 3:49 AM

## 2013-08-30 ENCOUNTER — Telehealth (INDEPENDENT_AMBULATORY_CARE_PROVIDER_SITE_OTHER): Payer: Self-pay | Admitting: General Surgery

## 2013-08-30 ENCOUNTER — Inpatient Hospital Stay (HOSPITAL_COMMUNITY): Payer: Medicare Other

## 2013-08-30 ENCOUNTER — Encounter (HOSPITAL_COMMUNITY): Payer: Self-pay | Admitting: Radiology

## 2013-08-30 DIAGNOSIS — B9689 Other specified bacterial agents as the cause of diseases classified elsewhere: Secondary | ICD-10-CM

## 2013-08-30 DIAGNOSIS — Z1635 Resistance to multiple antimicrobial drugs: Secondary | ICD-10-CM

## 2013-08-30 DIAGNOSIS — IMO0002 Reserved for concepts with insufficient information to code with codable children: Secondary | ICD-10-CM

## 2013-08-30 DIAGNOSIS — D62 Acute posthemorrhagic anemia: Secondary | ICD-10-CM

## 2013-08-30 DIAGNOSIS — J811 Chronic pulmonary edema: Secondary | ICD-10-CM

## 2013-08-30 LAB — TYPE AND SCREEN
ABO/RH(D): A POS
Antibody Screen: NEGATIVE
UNIT DIVISION: 0
UNIT DIVISION: 0
UNIT DIVISION: 0
Unit division: 0
Unit division: 0
Unit division: 0

## 2013-08-30 LAB — CBC
HCT: 21.9 % — ABNORMAL LOW (ref 39.0–52.0)
HEMATOCRIT: 18.1 % — AB (ref 39.0–52.0)
HEMATOCRIT: 19 % — AB (ref 39.0–52.0)
HEMOGLOBIN: 7 g/dL — AB (ref 13.0–17.0)
Hemoglobin: 6.5 g/dL — CL (ref 13.0–17.0)
Hemoglobin: 7.9 g/dL — ABNORMAL LOW (ref 13.0–17.0)
MCH: 31.7 pg (ref 26.0–34.0)
MCH: 32.1 pg (ref 26.0–34.0)
MCH: 32.3 pg (ref 26.0–34.0)
MCHC: 35.9 g/dL (ref 30.0–36.0)
MCHC: 36.1 g/dL — ABNORMAL HIGH (ref 30.0–36.0)
MCHC: 36.8 g/dL — ABNORMAL HIGH (ref 30.0–36.0)
MCV: 87.6 fL (ref 78.0–100.0)
MCV: 88.3 fL (ref 78.0–100.0)
MCV: 89 fL (ref 78.0–100.0)
PLATELETS: 123 10*3/uL — AB (ref 150–400)
Platelets: 110 10*3/uL — ABNORMAL LOW (ref 150–400)
Platelets: 124 10*3/uL — ABNORMAL LOW (ref 150–400)
RBC: 2.05 MIL/uL — ABNORMAL LOW (ref 4.22–5.81)
RBC: 2.17 MIL/uL — ABNORMAL LOW (ref 4.22–5.81)
RBC: 2.46 MIL/uL — AB (ref 4.22–5.81)
RDW: 14.8 % (ref 11.5–15.5)
RDW: 14.9 % (ref 11.5–15.5)
RDW: 14.9 % (ref 11.5–15.5)
WBC: 24.3 10*3/uL — ABNORMAL HIGH (ref 4.0–10.5)
WBC: 24.6 10*3/uL — AB (ref 4.0–10.5)
WBC: 25.7 10*3/uL — ABNORMAL HIGH (ref 4.0–10.5)

## 2013-08-30 LAB — RENAL FUNCTION PANEL
Albumin: 1.3 g/dL — ABNORMAL LOW (ref 3.5–5.2)
BUN: 86 mg/dL — ABNORMAL HIGH (ref 6–23)
CHLORIDE: 106 meq/L (ref 96–112)
CO2: 25 mEq/L (ref 19–32)
Calcium: 7.5 mg/dL — ABNORMAL LOW (ref 8.4–10.5)
Creatinine, Ser: 2.54 mg/dL — ABNORMAL HIGH (ref 0.50–1.35)
GFR calc Af Amer: 28 mL/min — ABNORMAL LOW (ref >90)
GFR, EST NON AFRICAN AMERICAN: 24 mL/min — AB (ref >90)
Glucose, Bld: 197 mg/dL — ABNORMAL HIGH (ref 70–99)
PHOSPHORUS: 4.4 mg/dL (ref 2.3–4.6)
POTASSIUM: 3 meq/L — AB (ref 3.7–5.3)
SODIUM: 145 meq/L (ref 137–147)

## 2013-08-30 LAB — GLUCOSE, CAPILLARY
GLUCOSE-CAPILLARY: 147 mg/dL — AB (ref 70–99)
GLUCOSE-CAPILLARY: 138 mg/dL — AB (ref 70–99)
Glucose-Capillary: 186 mg/dL — ABNORMAL HIGH (ref 70–99)
Glucose-Capillary: 132 mg/dL — ABNORMAL HIGH (ref 70–99)
Glucose-Capillary: 151 mg/dL — ABNORMAL HIGH (ref 70–99)

## 2013-08-30 LAB — MAGNESIUM: MAGNESIUM: 1.9 mg/dL (ref 1.5–2.5)

## 2013-08-30 LAB — PREPARE RBC (CROSSMATCH)

## 2013-08-30 MED ORDER — TRACE MINERALS CR-CU-F-FE-I-MN-MO-SE-ZN IV SOLN
INTRAVENOUS | Status: AC
  Administered 2013-08-30: 18:00:00 via INTRAVENOUS
  Filled 2013-08-30: qty 2000

## 2013-08-30 MED ORDER — TECHNETIUM TC 99M-LABELED RED BLOOD CELLS IV KIT
25.0000 | PACK | Freq: Once | INTRAVENOUS | Status: AC | PRN
  Administered 2013-08-30: 25 via INTRAVENOUS

## 2013-08-30 MED ORDER — IOHEXOL 300 MG/ML  SOLN
150.0000 mL | Freq: Once | INTRAMUSCULAR | Status: AC | PRN
  Administered 2013-08-30: 90 mL via INTRA_ARTERIAL

## 2013-08-30 MED ORDER — FAT EMULSION 20 % IV EMUL
250.0000 mL | INTRAVENOUS | Status: AC
  Administered 2013-08-30: 250 mL via INTRAVENOUS
  Filled 2013-08-30: qty 250

## 2013-08-30 MED ORDER — FENTANYL CITRATE 0.05 MG/ML IJ SOLN
INTRAMUSCULAR | Status: AC
  Filled 2013-08-30: qty 4

## 2013-08-30 MED ORDER — NITROGLYCERIN 1 MG/10 ML FOR IR/CATH LAB
100.0000 ug | INTRA_ARTERIAL | Status: AC
  Administered 2013-08-30: 100 ug via INTRA_ARTERIAL

## 2013-08-30 MED ORDER — MIDAZOLAM HCL 2 MG/2ML IJ SOLN
INTRAMUSCULAR | Status: AC | PRN
  Administered 2013-08-30: 1 mg via INTRAVENOUS

## 2013-08-30 MED ORDER — FENTANYL CITRATE 0.05 MG/ML IJ SOLN
INTRAMUSCULAR | Status: AC | PRN
Start: 2013-08-30 — End: 2013-08-30
  Administered 2013-08-30: 25 ug via INTRAVENOUS

## 2013-08-30 MED ORDER — MIDAZOLAM HCL 2 MG/2ML IJ SOLN
INTRAMUSCULAR | Status: AC
  Filled 2013-08-30: qty 4

## 2013-08-30 MED ORDER — POTASSIUM CHLORIDE 10 MEQ/50ML IV SOLN
10.0000 meq | INTRAVENOUS | Status: AC
  Administered 2013-08-30 (×4): 10 meq via INTRAVENOUS
  Filled 2013-08-30: qty 50

## 2013-08-30 NOTE — Telephone Encounter (Signed)
Called home and "work" which seemed like a cell.

## 2013-08-30 NOTE — Procedures (Signed)
Procedure:  Selective celiac, SMA and IMA arteriography. Access:  Right CFA, 5 Fr sheath Findings:  Normal celiac, SMA and IMA arteriography.  No evidence of active bleed or AVM.  Mesenteric veins well visualized and patent.

## 2013-08-30 NOTE — Progress Notes (Addendum)
PARENTERAL NUTRITION CONSULT NOTE - FOLLOW UP  Pharmacy Consult for TPN Indication: Perforated viscous  No Known Allergies  Patient Measurements: Height: 5\' 11"  (180.3 cm) (5'11") Weight: 179 lb 3.7 oz (81.3 kg) IBW/kg (Calculated) : 75.3 Adjusted Body Weight:  Usual Weight:   Vital Signs: Temp: 98.5 F (36.9 C) (02/13 0909) Temp src: Axillary (02/13 0824) BP: 143/84 mmHg (02/13 0909) Pulse Rate: 114 (02/13 0909) Intake/Output from previous day: 02/12 0701 - 02/13 0700 In: 4785 [I.V.:590; Blood:325; NG/GT:240; IV Piggyback:1650; TPN:1680] Out: 6520 [Urine:5595; Stool:925] Intake/Output from this shift: Total I/O In: 102.3 [Blood:92.5; NG/GT:9.8] Out: 350 [Urine:350]  Labs:  Recent Labs  08/28/13 1130  08/29/13 0320  08/29/13 1855 08/30/13 0001 08/30/13 0405  WBC  --   --  22.7*  --   --  25.7* 24.3*  HGB  --   < > 5.7*  < > 7.5* 7.0* 6.5*  HCT  --   < > 15.8*  < > 21.1* 19.0* 18.1*  PLT  --   --  127*  --   --  124* 110*  INR 1.37  --   --   --   --   --   --   < > = values in this interval not displayed.   Recent Labs  08/28/13 1450 08/29/13 0320 08/30/13 0405  NA 145 149* 145  K 3.3* 3.4* 3.0*  CL 109 111 106  CO2 21 22 25   GLUCOSE 140* 225* 197*  BUN 95* 91* 86*  CREATININE 2.68* 2.58* 2.54*  CALCIUM 7.3* 7.1* 7.5*  MG  --  1.5 1.9  PHOS 3.8 3.8 4.4  PROT  --  4.2*  --   ALBUMIN 1.5* 1.3* 1.3*  AST  --  20  --   ALT  --  17  --   ALKPHOS  --  79  --   BILITOT  --  1.5*  --    Estimated Creatinine Clearance: 28.8 ml/min (by C-G formula based on Cr of 2.54).    Recent Labs  08/29/13 2338 08/30/13 0345 08/30/13 0735  GLUCAP 156* 186* 132*    Medications:  Scheduled:  . antiseptic oral rinse  15 mL Mouth Rinse QID  . chlorhexidine  15 mL Mouth Rinse BID  . darbepoetin (ARANESP) injection - NON-DIALYSIS  100 mcg Subcutaneous Q Mon-1800  . feeding supplement (PRO-STAT SUGAR FREE 64)  30 mL Per Tube TID  . furosemide  80 mg Intravenous  Q12H  . insulin aspart  0-15 Units Subcutaneous 6 times per day  . metoprolol  5 mg Intravenous Q4H  . pantoprazole (PROTONIX) IV  40 mg Intravenous Q12H  . sulfamethoxazole-trimethoprim  400 mg of trimethoprim Intravenous Q12H    Insulin Requirements in the past 24 hours:  11 units mod SSI/12h & 12 units insulin in TPN  Current Nutrition:  -Clinimix E 5/15 at 60 ml/hr + lipids at 10 ml/hr- 72 gm of protein and 1584 kcals  -Vital 1.5 at 85mL/hr + Prostat 84mL TID- provides 61g protein and 660kcal  (Goal rate is 71mL/hr with Prostat liquid 66mL PT TID. This will provide 134g protein and 2280 kcal)   Nutritional Goals:  2100-2300 kCal, 120-135 grams of protein per day per RD recommendations 2/9   Admit: 73 year old male with SBO and ischemic bowel s/p resection at [facility]. Was transferred to [facility] for continuing hemodialysis and inability to wean from ventilator. developed a new bowel perforation at his anastomosis site and is s/p ex  lap (2/1) with small bowel resection and wound vac placement. Now with melena in ostomy- surgery considering angio. Surgery may be needed though he is high risk   GI: new bowel perforation s/p ex lap; was on TPN at [facility]. To OR 2/3 for small bowel anastomosis, colostomy, gastrostomy tube.  Continued melanotic stool, for tagged RBC study to localize bleed- suspected bleeding from anastamosis.  PPI-IV  Nutrition: TF started 2/9- pt had emesis when TF turned up to 67mL/hr on 2/9- rate was turned down to 62mL/hr & has continued at this rate with Pro-Stat TID- tolerating with minimal residuals. Unable to advance as pt with frank melena which has required transfusion   Endo: Half of cbg's > 150 since new TPN hung- CBGs/24h 132-186.  Lytes: K 3- K-runs x 4 ordered this AM, Na 145, Phos 4.4, Mag 1.9, CorCa ~9.7  Renal: renal function recovering- hasn't had HD since 2/2, but only tolerated 1 hour d/t tachycardia and decreased BP. Creat 2.54, BUN 86- both continue to  improve; UOP 2.9 ml/kg/hr.    Hepatobil: LFTs WNL except albumin remains low at 1.3 and Tbili now elevated at 1.5; trig 146; prealbumin improved to 13.2-still below normal limits   Heme:  Hemoptysis and melanotic stools.  Hg 6.5.  PRBC x 1 this AM, for tagged RBC study to localize bleed- suspected bleeding from anastamosis.  Repeat CBC after transfusion.  Aranesp 14mcg SQ qMon  ID: Abx at [facility]: Flagyl 1/21>>2/1, Meropenem 1/24>>2/1   Zosyn 2/1>> (CCM noted stated 7 days total, today is day #11)  Vanc 2/9>>2/10  Linezolid 2/10>> 2/12 Bactrim IV 2/12 >>  2/9 trach aspirate:  Stenotrophomonas maltophilia, s: SMX/TMP WBC 24.3, Tmax 99.7   TPN Access: CVC   Plan:  1. TF to continue at 51mL/hr today + Prostat 49mL TID (61g protein + 660kcal)  2. Continue Clinimix E 5/15 + full multivitamin and trace elements- will continue at same rate of 60 ml/hr + 20% lipids at 10 ml/hr. This provides 72 gm of protein and 1584 kcals. Combined with TF, providing 100% of nutritional needs  3. Increase Insulin to 17 units per bag.  Continue SSI q4h.  4. KCl 38mEq IV runs x4 per Renal for repletion of electrolytes  5. TPN labs as ordered- BMET and Magnesium for tomorrow morning  6. Follow for ability to increase TF rate after GI bleeding resolved and wean off TPN  Gracy Bruins, PharmD [facility]

## 2013-08-30 NOTE — Progress Notes (Signed)
Elink MD Dr. Earnest Conroy made aware of hgb drop 9.4 to 7.5. Orders to re-check at 0000.

## 2013-08-30 NOTE — Progress Notes (Signed)
CRITICAL VALUE ALERT  Critical value received:  Hgb 6.5  Date of notification:  08/30/13  Time of notification:  0500  Critical value read back:yes  Nurse who received alert:  Rolla Plate   MD notified (1st page):  MD saw value prior to notification, new orders received

## 2013-08-30 NOTE — Progress Notes (Signed)
Patient ID: Jason Harmon, male   DOB: 04/22/1941, 73 y.o.   MRN: [id number] I spoke with his son on the phone and his wife and daughter in law at the bedside. Proceeding with NM study then decide on angio. Prognosis is quite poor and I discussed that. Patient examined and I agree with the assessment and plan  Georganna Skeans, MD, MPH, FACS Pager: [phone]  08/30/2013 9:04 AM

## 2013-08-30 NOTE — Progress Notes (Signed)
[place] Progress Note Patient Name: Jason Harmon DOB: 1941/07/07 MRN: [id number]  Date of Service  08/30/2013   HPI/Events of Note  Patient has renal insuff with creat 2.5.  dye load today with IR procedure.  Patient on lasix 80mg  IV bid.  Nurse concerned about dose diuretic tonight in face of renal insuff and dye load.  I agree.   eICU Interventions  Hold lasix tonight and follow renal function   Intervention Category Intermediate Interventions: Hypervolemia - evaluation and management  Mauri Brooklyn, P 08/30/2013, 10:07 PM

## 2013-08-30 NOTE — H&P (Signed)
Referring Physician: Dr. Grandville Silos HPI: Jason Harmon is an 73 y.o. male with history of small bowel obstruction s/p resection s/p reexploration with small bowel anastomosis, colostomy and G tube. Patient with melenic stools in ostomy and acute blood loss, NM scan (+) bleed. Patient history of AKI previously on HD, last session 2/2, nephrology following and aware of angiogram.    Past Medical History:  Past Medical History  Diagnosis Date  . Renal disorder   . A-fib   . Sepsis   . Ischemic bowel disease   . Acute renal failure     Past Surgical History:  Past Surgical History  Procedure Laterality Date  . Abdominal surgery    . Laparotomy N/A 08/18/2013    Procedure: EXPLORATORY LAPAROTOMY;  Surgeon: Joyice Faster. Cornett, MD;  Location: [place];  Service: General;  Laterality: N/A;  . Laparotomy N/A 08/20/2013    Procedure: EXPLORATORY LAPAROTOMY WITH  SMALL BOWEL RESECTION;  Surgeon: Rolm Bookbinder, MD;  Location: [place];  Service: General;  Laterality: N/A;  . Gastrostomy N/A 08/20/2013    Procedure: G-TUBE PLACEMENT;  Surgeon: Rolm Bookbinder, MD;  Location: [place];  Service: General;  Laterality: N/A;  . Colostomy N/A 08/20/2013    Procedure: COLOSTOMY;  Surgeon: Rolm Bookbinder, MD;  Location: [place];  Service: General;  Laterality: N/A;    Family History: History reviewed. No pertinent family history.  Social History:  reports that he has never smoked. He has never used smokeless tobacco. He reports that he does not drink alcohol or use illicit drugs.  Allergies: No Known Allergies  Medications:   Medication List    ASK your doctor about these medications       acetaminophen 325 MG tablet  Commonly known as:  TYLENOL  Place 650 mg into feeding tube every 6 (six) hours as needed for mild pain or fever.     amLODipine 5 MG tablet  Commonly known as:  NORVASC  Place 5 mg into feeding tube daily.     chlorhexidine 0.12 % solution  Commonly known as:  PERIDEX  Use as  directed 15 mLs in the mouth or throat 2 (two) times daily.     clonazePAM 1 MG tablet  Commonly known as:  KLONOPIN  Place 1 mg into feeding tube every 8 (eight) hours.     dexmedetomidine 200 MCG/50ML Soln  Commonly known as:  PRECEDEX  Inject 0.4-1.2 mcg/kg/hr into the vein continuous.     fat emulsion 20 % infusion  Inject into the vein continuous. Pt was receiving 273m at [facility] on M/W/F     haloperidol lactate 5 MG/ML injection  Commonly known as:  HALDOL  Inject 1 mg into the muscle every 6 (six) hours as needed (sedation).     hydroxypropyl methylcellulose 2.5 % ophthalmic solution  Commonly known as:  ISOPTO TEARS  Place 2 drops into both eyes every 4 (four) hours as needed for dry eyes.     insulin detemir 100 UNIT/ML injection  Commonly known as:  LEVEMIR  Inject 12 Units into the skin 2 (two) times daily.     ipratropium-albuterol 0.5-2.5 (3) MG/3ML Soln  Commonly known as:  DUONEB  Take 3 mLs by nebulization every 6 (six) hours.     iron polysaccharides 150 MG capsule  Commonly known as:  NIFEREX  Take 150 mg by mouth 2 (two) times daily.     lactobacillus Pack  Place 1 g into feeding tube 2 (two) times daily.  methylPREDNISolone sodium succinate 40 mg/mL injection  Commonly known as:  SOLU-MEDROL  Inject 40 mg into the vein every 8 (eight) hours.     metoprolol tartrate 25 MG tablet  Commonly known as:  LOPRESSOR  Place 75 mg into feeding tube 2 (two) times daily.     mupirocin ointment 2 %  Commonly known as:  BACTROBAN  Place 1 application into the nose 3 (three) times daily.     neomycin-bacitracin-polymyxin [phone] ointment  Apply 1 application topically 2 (two) times daily.     nitroGLYCERIN 0.4 MG SL tablet  Commonly known as:  NITROSTAT  Place 0.4 mg under the tongue every 5 (five) minutes as needed for chest pain.     pantoprazole 40 MG injection  Commonly known as:  PROTONIX  Inject 40 mg into the vein 2 (two) times daily.      sennosides-docusate sodium 8.6-50 MG tablet  Commonly known as:  SENOKOT-S  Take 1 tablet by mouth 2 (two) times daily as needed for constipation.     sodium chloride 0.9 % SOLN 100 mL with meropenem 1 G SOLR 1 g  Inject 1 g into the vein daily.     temazepam 15 MG capsule  Commonly known as:  RESTORIL  Take 30 mg by mouth at bedtime as needed for sleep.     TPN (CLINIMIX-E)ADULT WITH LYTES  Inject 65 mL/hr into the vein continuous.       Please HPI for pertinent positives, otherwise complete 10 system ROS negative.  Physical Exam: BP 148/83  Pulse 114  Temp(Src) 98 F (36.7 C) (Oral)  Resp 29  Ht _0  (1.803 m)  Wt 179 lb 3.7 oz (81.3 kg)  BMI 25.01 kg/m2  SpO2 98% Body mass index is 25.01 kg/(m^2).  General Appearance:  Lying in bed on ventilator.  Head:  Normocephalic, without obvious abnormality, atraumatic  Neck: Supple, symmetrical, trachea midline  Lungs:   Scattered rhonchi throughout.   Chest Wall:  No tenderness or deformity  Heart:  Tachycardic and rhythm, no murmur, rub or gallop.  Abdomen:   Dark brown stool in ostomy bag, dressings intact  Pulses: 1+ and symmetric   Results for orders placed during the hospital encounter of 08/18/13 (from the past 48 hour(s))  GLUCOSE, CAPILLARY     Status: Abnormal   Collection Time    08/28/13  7:30 PM      Result Value Ref Range   Glucose-Capillary 153 (*) 70 - 99 mg/dL   Comment 1 Documented in Chart     Comment 2 Notify RN    GLUCOSE, CAPILLARY     Status: Abnormal   Collection Time    08/28/13 11:36 PM      Result Value Ref Range   Glucose-Capillary 153 (*) 70 - 99 mg/dL   Comment 1 Documented in Chart     Comment 2 Notify RN    PROCALCITONIN     Status: None   Collection Time    08/29/13  3:20 AM      Result Value Ref Range   Procalcitonin 0.69     Comment:            Interpretation:     PCT > 0.5 ng/mL and <= 2 ng/mL:     Systemic infection (sepsis) is possible,     but other conditions are known  to elevate     PCT as well.     (NOTE)  ICU PCT Algorithm               Non ICU PCT Algorithm        ----------------------------     ------------------------------             PCT < 0.25 ng/mL                 PCT < 0.1 ng/mL         Stopping of antibiotics            Stopping of antibiotics           strongly encouraged.               strongly encouraged.        ----------------------------     ------------------------------           PCT level decrease by               PCT < 0.25 ng/mL           >= 80% from peak PCT           OR PCT 0.25 - 0.5 ng/mL          Stopping of antibiotics                                                 encouraged.         Stopping of antibiotics               encouraged.        ----------------------------     ------------------------------           PCT level decrease by              PCT >= 0.25 ng/mL           < 80% from peak PCT            AND PCT >= 0.5 ng/mL            Continuing antibiotics                                                  encouraged.           Continuing antibiotics                encouraged.        ----------------------------     ------------------------------         PCT level increase compared          PCT > 0.5 ng/mL             with peak PCT AND              PCT >= 0.5 ng/mL             Escalation of antibiotics                                              strongly encouraged.          Escalation of antibiotics  strongly encouraged.  CBC     Status: Abnormal   Collection Time    08/29/13  3:20 AM      Result Value Ref Range   WBC 22.7 (*) 4.0 - 10.5 K/uL   RBC 1.79 (*) 4.22 - 5.81 MIL/uL   Hemoglobin 5.7 (*) 13.0 - 17.0 g/dL   Comment: DELTA CHECK NOTED     REPEATED TO VERIFY     CRITICAL RESULT CALLED TO, READ BACK BY AND VERIFIED WITH:     A. [facility] (RN) [phone] 08/29/2013 L. LOMAX   HCT 15.8 (*) 39.0 - 52.0 %   MCV 88.3  78.0 - 100.0 fL   MCH 31.8  26.0 - 34.0 pg   MCHC 36.1 (*) 30.0 - 36.0 g/dL    RDW 14.9  11.5 - 15.5 %   Platelets 127 (*) 150 - 400 K/uL  COMPREHENSIVE METABOLIC PANEL     Status: Abnormal   Collection Time    08/29/13  3:20 AM      Result Value Ref Range   Sodium 149 (*) 137 - 147 mEq/L   Potassium 3.4 (*) 3.7 - 5.3 mEq/L   Chloride 111  96 - 112 mEq/L   CO2 22  19 - 32 mEq/L   Glucose, Bld 225 (*) 70 - 99 mg/dL   BUN 91 (*) 6 - 23 mg/dL   Creatinine, Ser 2.58 (*) 0.50 - 1.35 mg/dL   Calcium 7.1 (*) 8.4 - 10.5 mg/dL   Total Protein 4.2 (*) 6.0 - 8.3 g/dL   Albumin 1.3 (*) 3.5 - 5.2 g/dL   AST 20  0 - 37 U/L   ALT 17  0 - 53 U/L   Alkaline Phosphatase 79  39 - 117 U/L   Total Bilirubin 1.5 (*) 0.3 - 1.2 mg/dL   GFR calc non Af Amer 24 (*) >90 mL/min   GFR calc Af Amer 27 (*) >90 mL/min   Comment: (NOTE)     The eGFR has been calculated using the CKD EPI equation.     This calculation has not been validated in all clinical situations.     eGFR's persistently <90 mL/min signify possible Chronic Kidney     Disease.  MAGNESIUM     Status: None   Collection Time    08/29/13  3:20 AM      Result Value Ref Range   Magnesium 1.5  1.5 - 2.5 mg/dL  PHOSPHORUS     Status: None   Collection Time    08/29/13  3:20 AM      Result Value Ref Range   Phosphorus 3.8  2.3 - 4.6 mg/dL  GLUCOSE, CAPILLARY     Status: Abnormal   Collection Time    08/29/13  3:31 AM      Result Value Ref Range   Glucose-Capillary 204 (*) 70 - 99 mg/dL   Comment 1 Documented in Chart     Comment 2 Notify RN    PREPARE RBC (CROSSMATCH)     Status: None   Collection Time    08/29/13  4:30 AM      Result Value Ref Range   Order Confirmation ORDER PROCESSED BY BLOOD BANK    GLUCOSE, CAPILLARY     Status: Abnormal   Collection Time    08/29/13  7:47 AM      Result Value Ref Range   Glucose-Capillary 187 (*) 70 - 99 mg/dL   Comment 1 Documented in Chart  Comment 2 Notify RN    GLUCOSE, CAPILLARY     Status: Abnormal   Collection Time    08/29/13 12:25 PM      Result Value  Ref Range   Glucose-Capillary 180 (*) 70 - 99 mg/dL   Comment 1 Documented in Chart     Comment 2 Notify RN    HEMOGLOBIN AND HEMATOCRIT, BLOOD     Status: Abnormal   Collection Time    08/29/13  1:00 PM      Result Value Ref Range   Hemoglobin 9.4 (*) 13.0 - 17.0 g/dL   Comment: POST TRANSFUSION SPECIMEN   HCT 25.6 (*) 39.0 - 52.0 %  GLUCOSE, CAPILLARY     Status: Abnormal   Collection Time    08/29/13  4:24 PM      Result Value Ref Range   Glucose-Capillary 232 (*) 70 - 99 mg/dL   Comment 1 Documented in Chart     Comment 2 Notify RN    HEMOGLOBIN AND HEMATOCRIT, BLOOD     Status: Abnormal   Collection Time    08/29/13  6:55 PM      Result Value Ref Range   Hemoglobin 7.5 (*) 13.0 - 17.0 g/dL   Comment: REPEATED TO VERIFY     DELTA CHECK NOTED   HCT 21.1 (*) 39.0 - 52.0 %  GLUCOSE, CAPILLARY     Status: Abnormal   Collection Time    08/29/13  7:37 PM      Result Value Ref Range   Glucose-Capillary 160 (*) 70 - 99 mg/dL   Comment 1 Documented in Chart     Comment 2 Notify RN    GLUCOSE, CAPILLARY     Status: Abnormal   Collection Time    08/29/13 11:38 PM      Result Value Ref Range   Glucose-Capillary 156 (*) 70 - 99 mg/dL   Comment 1 Documented in Chart     Comment 2 Notify RN    CBC     Status: Abnormal   Collection Time    08/30/13 12:01 AM      Result Value Ref Range   WBC 25.7 (*) 4.0 - 10.5 K/uL   RBC 2.17 (*) 4.22 - 5.81 MIL/uL   Hemoglobin 7.0 (*) 13.0 - 17.0 g/dL   HCT 19.0 (*) 39.0 - 52.0 %   MCV 87.6  78.0 - 100.0 fL   MCH 32.3  26.0 - 34.0 pg   MCHC 36.8 (*) 30.0 - 36.0 g/dL   RDW 14.9  11.5 - 15.5 %   Platelets 124 (*) 150 - 400 K/uL  GLUCOSE, CAPILLARY     Status: Abnormal   Collection Time    08/30/13  3:45 AM      Result Value Ref Range   Glucose-Capillary 186 (*) 70 - 99 mg/dL   Comment 1 Documented in Chart     Comment 2 Notify RN    RENAL FUNCTION PANEL     Status: Abnormal   Collection Time    08/30/13  4:05 AM      Result Value Ref  Range   Sodium 145  137 - 147 mEq/L   Potassium 3.0 (*) 3.7 - 5.3 mEq/L   Chloride 106  96 - 112 mEq/L   CO2 25  19 - 32 mEq/L   Glucose, Bld 197 (*) 70 - 99 mg/dL   BUN 86 (*) 6 - 23 mg/dL   Creatinine, Ser 2.54 (*) 0.50 - 1.35  mg/dL   Calcium 7.5 (*) 8.4 - 10.5 mg/dL   Phosphorus 4.4  2.3 - 4.6 mg/dL   Albumin 1.3 (*) 3.5 - 5.2 g/dL   GFR calc non Af Amer 24 (*) >90 mL/min   GFR calc Af Amer 28 (*) >90 mL/min   Comment: (NOTE)     The eGFR has been calculated using the CKD EPI equation.     This calculation has not been validated in all clinical situations.     eGFR's persistently <90 mL/min signify possible Chronic Kidney     Disease.  MAGNESIUM     Status: None   Collection Time    08/30/13  4:05 AM      Result Value Ref Range   Magnesium 1.9  1.5 - 2.5 mg/dL  CBC     Status: Abnormal   Collection Time    08/30/13  4:05 AM      Result Value Ref Range   WBC 24.3 (*) 4.0 - 10.5 K/uL   RBC 2.05 (*) 4.22 - 5.81 MIL/uL   Hemoglobin 6.5 (*) 13.0 - 17.0 g/dL   Comment: REPEATED TO VERIFY     CRITICAL RESULT CALLED TO, READ BACK BY AND VERIFIED WITH:     L. [facility] (RN) [phone] 08/30/2013 L. LOMAX   HCT 18.1 (*) 39.0 - 52.0 %   MCV 88.3  78.0 - 100.0 fL   MCH 31.7  26.0 - 34.0 pg   MCHC 35.9  30.0 - 36.0 g/dL   RDW 14.9  11.5 - 15.5 %   Platelets 110 (*) 150 - 400 K/uL   Comment: SPECIMEN CHECKED FOR CLOTS     REPEATED TO VERIFY     PLATELET COUNT CONFIRMED BY SMEAR  PREPARE RBC (CROSSMATCH)     Status: None   Collection Time    08/30/13  5:30 AM      Result Value Ref Range   Order Confirmation ORDER PROCESSED BY BLOOD BANK    TYPE AND SCREEN     Status: None   Collection Time    08/30/13  6:38 AM      Result Value Ref Range   ABO/RH(D) A POS     Antibody Screen NEG     Sample Expiration 09/02/2013     Unit Number [id number]     Blood Component Type RED CELLS,LR     Unit division 00     Status of Unit ISSUED     Transfusion Status OK TO TRANSFUSE     Crossmatch  Result Compatible    GLUCOSE, CAPILLARY     Status: Abnormal   Collection Time    08/30/13  7:35 AM      Result Value Ref Range   Glucose-Capillary 132 (*) 70 - 99 mg/dL   Comment 1 Notify RN    GLUCOSE, CAPILLARY     Status: Abnormal   Collection Time    08/30/13 11:42 AM      Result Value Ref Range   Glucose-Capillary 147 (*) 70 - 99 mg/dL   Comment 1 Notify RN    CBC     Status: Abnormal   Collection Time    08/30/13  3:00 PM      Result Value Ref Range   WBC 24.6 (*) 4.0 - 10.5 K/uL   RBC 2.46 (*) 4.22 - 5.81 MIL/uL   Hemoglobin 7.9 (*) 13.0 - 17.0 g/dL   Comment: REPEATED TO VERIFY     POST TRANSFUSION SPECIMEN   HCT  21.9 (*) 39.0 - 52.0 %   MCV 89.0  78.0 - 100.0 fL   MCH 32.1  26.0 - 34.0 pg   MCHC 36.1 (*) 30.0 - 36.0 g/dL   RDW 14.8  11.5 - 15.5 %   Platelets 123 (*) 150 - 400 K/uL  GLUCOSE, CAPILLARY     Status: Abnormal   Collection Time    08/30/13  4:11 PM      Result Value Ref Range   Glucose-Capillary 151 (*) 70 - 99 mg/dL   Comment 1 Notify RN     Ct Chest Wo Contrast  08/29/2013   CLINICAL DATA:  Hemoptysis  EXAM: CT CHEST WITHOUT CONTRAST  TECHNIQUE: Multidetector CT imaging of the chest was performed following the standard protocol without IV contrast.  COMPARISON:  Chest radiographs dated 08/29/2013  FINDINGS: Multifocal patchy ground-glass opacities throughout all lobes, with a perihilar distribution, suspicious for moderate interstitial edema. Underlying infection is difficult to exclude but considered less likely.  Moderate left and small to moderate right pleural effusions. Associated lower lobe opacities, likely atelectasis.  No pneumothorax.  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  Cardiomegaly. No pericardial effusion. Coronary atherosclerosis in the LAD.  Mildly prominent mediastinal nodes measuring up to 7 mm short axis (series 2/ image 24), likely reactive. No suspicious axillary lymphadenopathy.  Visualized upper abdomen is grossly  unremarkable.  Degenerative changes of the visualized thoracolumbar spine.  IMPRESSION: Cardiomegaly with suspected moderate interstitial edema. Underlying infection is difficult to exclude but considered less likely.  Moderate left and small to moderate right pleural effusions. Associated lower lobe atelectasis.   Electronically Signed   By: Julian Hy M.D.   On: 08/29/2013 14:01   Nm Gi Blood Loss  08/30/2013   CLINICAL DATA:  Dark blood in the patient's ostomy bag, likely related to breakdown of an enteroenteric anastomosis in the small bowel.  EXAM: NUCLEAR MEDICINE GASTROINTESTINAL BLEEDING SCAN  TECHNIQUE: Sequential abdominal images were obtained following intravenous administration of Tc-53mlabeled red blood cells.  COMPARISON:  CT abdomen and pelvis 08/17/2013. No prior nuclear imaging.  RADIOPHARMACEUTICALS:  25 mCi ULTRATAG TECHNETIUM TC 31M-LABELED RED BLOOD CELLS IV  FINDINGS: Abnormal activity to the right of midline in the mid abdomen, at or near the level of the pelvic brim, which progresses in a pattern that is consistent with a small bowel source. Based on the anatomy on the prior CT, this likely corresponds to the enteroenteric anastomosis. The small bowel in this region on the prior CT was abnormal with marked wall thickening consistent with severe inflammation.  IMPRESSION: Active GI bleeding from a loop of small bowel to the right of midline at or near the level of the pelvic brim, likely corresponding to the enteroenteric anastomosis based on the anatomy on the recent CT scan.  These results were called by telephone at the time of interpretation on 08/30/2013 at 3:10 PM to Dr. BGeorganna Skeans, who verbally acknowledged these results.   Electronically Signed   By: TEvangeline DakinM.D.   On: 08/30/2013 15:15   Dg Chest Port 1 View  08/29/2013   CLINICAL DATA:  Airspace disease  EXAM: PORTABLE CHEST - 1 VIEW  COMPARISON:  DG CHEST 1V PORT dated 08/28/2013; DG CHEST 1V PORT dated  08/22/2013; DG CHEST 1V PORT dated 08/14/2013; DG CHEST 1V PORT dated 08/07/2013  FINDINGS: Grossly unchanged cardiac silhouette and mediastinal contours. Stable position of support apparatus. No pneumothorax. Pulmonary vasculature remains indistinct. Minimally improved aeration of  the lungs with persistent right upper and bilateral lower lung heterogeneous opacities. Trace right-sided effusion is not excluded. Grossly unchanged bones.  IMPRESSION: 1.  Stable positioning of support apparatus.  No pneumothorax. 2. Minimally improved aeration of the lungs suggests minimally improved edema and/or atelectasis. 3. Residual bilateral airspace disease may represent residual alveolar pulmonary edema though underlying infection not excluded.   Electronically Signed   By: Sandi Mariscal M.D.   On: 08/29/2013 08:09    Assessment/Plan Small bowel obstruction s/p resection 07/26/13 at [facility] s/p reexploration with small bowel anastomosis, colostomy and G tube. Melenic stools, NM scan (+) bleed, likely anastomotic site. Acute blood loss, Hgb 7.9, HCT 21.9 IR received request for image guided abdominal visceral angiogram and possible embolization. Images and labs reviewed, tube feeds stopped 1700.  Risks and Benefits discussed with the patient and his family. All questions were answered, patient's family is agreeable to proceed. Consent signed by patient's wife and in chart. VDRF s/p tracheostomy AKI previously on HD, last session 08/19/13   Hedy Jacob PA-C 08/30/2013, 4:52 PM

## 2013-08-30 NOTE — Progress Notes (Signed)
Patient ID: Jason Harmon, male   DOB: 04/18/1941, 73 y.o.   MRN: [id number] Nuc med study shows bleeding likely at [facility] anastamosis. I spoke with his daughter in law and they would like to persue angioembolization if possible. I explaied the high risk to his renal function again. I also related, again, his high mortality risk.I will D/W Dr. Anselm Pancoast.  Georganna Skeans, MD, MPH, FACS Pager: [phone]

## 2013-08-30 NOTE — Progress Notes (Signed)
I D/W Dr. Anselm Pancoast from IR - in light of renal function will start with tagged RBC study to localize. This is more sensitive than angio. If positive, will D/W family possible angio.  I do not feel he would survive another surgery/open abdomen.  Georganna Skeans, MD, MPH, FACS Pager: [phone]

## 2013-08-30 NOTE — Progress Notes (Signed)
Admit: 08/18/2013 LOS: 53  50M with dialysis dependent prolonged AKI- but no HD since 2/2, with chronic ventilatory failure, small bowel perforation (repaired 08/20/13), abdominal septic shock.    Subjective:  Stable to improved renal function- great UOP-  Continuing to bleed, hgb 6.5 this AM- difficult situation- is not thought that he would survive another big operation , doing a nuclear scan to look to look for source Having high TF residuals Had to go back on full vent support   02/12 0701 - 02/13 0700 In: 4785 [I.V.:590; Blood:325; NG/GT:240; IV Piggyback:1650; TPN:1680] Out: 5366 [Urine:5595; Stool:925]  Filed Weights   08/28/13 0448 08/29/13 0447 08/30/13 0500  Weight: 84.3 kg (185 lb 13.6 oz) 81.6 kg (179 lb 14.3 oz) 81.3 kg (179 lb 3.7 oz)    Current meds: reviewed Current Labs: reviewed   Physical Exam:  Blood pressure 135/82, pulse 119, temperature 98.1 F (36.7 C), temperature source Axillary, resp. rate 27, height 5\' 11"  (1.803 m), weight 81.3 kg (179 lb 3.7 oz), SpO2 98.00%. GEN: chronically ill appearing, Trach, more awake and alert but no commands- struggling to breath even on vent- family at bedside ENT: Lurline Idol - HD cath out EYES: EOMI. Eyes open to noise, stimullus  CV: tachy, regular  PULM: +Bs b/l  ABD: wound vac at midline  SKIN: large ventral abd incision  EXT: 2+ pitting edema b/l  Assessment/Plan 1. Prolonged Dialysis Dependent AKI: Appears at this time to be liberated from RRT with sufficient but low GFR and adequate UOP.  No HD since 2/2 (which was abandoned early 2/2 hemodynamin instability).  Creatinine seeming to trend down slowly. Cont to follow closely.  Have added lasix even though is making plenty of urine- is getting nearly daily transfusions and is more in than out 2. Anemia: transfusions per CCM and surgery.  Have added ESA- now also ?bleeding- off blood thinners- for nuc scan today to look for source- considering embolization but of course that  would put the kidney function he has remaining at risk 3. Septic shock and SB performation: per surgery and CCM. WBC climbing again- redman from [facility]    Mild metabolic acidosis: improved 5. Dispo- has only been a month since he was in "good health" according to wife.  Feel like she would do whatever it takes at this time.  Fortunately, dialysis not needed for now.  6. Hypokalemia- gentle repletion- will give another 40 today - getting better- putting in tpn as well.  7. Hypertension- maybe due to positive fluid balance mostly due to blood- have increased lopressor  Lucie Friedlander A   08/30/2013, 8:52 AM   Recent Labs Lab 08/28/13 1450 08/29/13 0320 08/30/13 0405  NA 145 149* 145  K 3.3* 3.4* 3.0*  CL 109 111 106  CO2 21 22 25   GLUCOSE 140* 225* 197*  BUN 95* 91* 86*  CREATININE 2.68* 2.58* 2.54*  CALCIUM 7.3* 7.1* 7.5*  PHOS 3.8 3.8 4.4    Recent Labs Lab 08/29/13 0320  08/29/13 1855 08/30/13 0001 08/30/13 0405  WBC 22.7*  --   --  25.7* 24.3*  HGB 5.7*  < > 7.5* 7.0* 6.5*  HCT 15.8*  < > 21.1* 19.0* 18.1*  MCV 88.3  --   --  87.6 88.3  PLT 127*  --   --  124* 110*  < > = values in this interval not displayed.

## 2013-08-30 NOTE — H&P (Signed)
Agree.  Bleeding scan reviewed.  Will proceed with mesenteric arteriography today.

## 2013-08-30 NOTE — Progress Notes (Signed)
PULMONARY  / CRITICAL CARE MEDICINE  Name: Jason Harmon MRN: [id number] DOB: 1940/08/10    ADMISSION DATE:  08/18/2013  CHIEF COMPLAINT:  Bowel perforation  BRIEF PATIENT DESCRIPTION:  73 yo male with SBO/ischemic bowel s/p resection with subsequent complicated medical course including respiratory failure and inability to wean from ventilator,admitted from [facility] with bowel perforation at anastomosis site.  SIGNIFICANT EVENTS: 1/22 Tracheostomy 2/01 Transfer from [facility], to ER, to [facility] ICU.  [facility] consulted 2/1 Exploratory laparotomy with segmental resection of small bowel anastomosis and sigmoid colon with placement of abdominal wound vac 2/02 Off pressors, did not tolerate HD 2/4 small bowel anastomosis with gastrostomy & End sigmoid colostomy 2/8 ATC x 24h 2/9 Hemoptysis, anemia >> PRBC transfusion; CXR with new infiltrate 2/10 Red rash from vancomycin, increased WOB 2/12 2 units PRBC transfusion 2/13 Transfuse PRBC  STUDIES:  CT A/P with contrast 1/31 >>  bowel perforation, likely anastomotic breakdown at proximal enteroenterostomy with pneumoperitoneum and spilling of oral contrast, moderate ascites with peritonitis CT chest 2/12 >> b/l GGO, Lt > Rt pleural effusions  LINES / TUBES: Tracheostomy 1/22>>> R IJ HD catheter 1/12>>>  CULTURES: Sputum 2/9 >> Moderate Stenotrophomonas maltophilia  ANTIBIOTICS: Zosyn 2/1 >> 2/12 Vancomycin 2/9 >> 2/10 Linezolid 2/10 >> 2/12 Bactrim 2/12 >>   SUBJECTIVE: Still has sputum with blood, but less.  Continues to have melena and needing PRBC's.  PHYSICAL EXAM VITAL SIGNS: Temp:  [98.1 F (36.7 C)-99 F (37.2 C)] 98.1 F (36.7 C) (02/13 1028) Pulse Rate:  [101-129] 101 (02/13 1118) Resp:  [19-34] 24 (02/13 1118) BP: (117-172)/(70-111) 133/78 mmHg (02/13 1028) SpO2:  [94 %-100 %] 98 % (02/13 1118) FiO2 (%):  [40 %-70 %] 40 % (02/13 1118) Weight:  [179 lb 3.7 oz (81.3 kg)] 179 lb 3.7 oz (81.3 kg) (02/13 0500) VENT SETTINGS: Vent  Mode:  [-] CPAP;PSV FiO2 (%):  [40 %-70 %] 40 % Set Rate:  [24 bmp] 24 bmp Vt Set:  [600 mL] 600 mL PEEP:  [8 cmH20] 8 cmH20 Pressure Support:  [10 cmH20] 10 cmH20 Plateau Pressure:  [23 cmH20-26 cmH20] 26 cmH20 INTAKE / OUTPUT: Intake/Output     02/12 0701 - 02/13 0700 02/13 0701 - 02/14 0700   I.V. (mL/kg) 590 (7.3)    Blood 325 234.2   Other 300    NG/GT 240 39.8   IV Piggyback 1650    TPN 1680    Total Intake(mL/kg) 4785 (58.9) 274 (3.4)   Urine (mL/kg/hr) 5595 (2.9) 1300 (3.4)   Stool 925 (0.5)    Total Output 6520 1300   Net -1735 -1026          PHYSICAL EXAMINATION: General: ill appearing, lying in bed on vent. Neuro: somnolent, RASS -2 HEENT:  Trach site with mild bloody secretions Cardiovascular:  Tachycardic, regular.  No M/R/G. Lungs: scattered rhonchi, no wheeze Abdomen:  Dressings in place.  Melanotic appearing drainage in ostomy bag.  BS hypoactive. Musculoskeletal:  2+ edema, non-pitting. Skin:  Intact, no rash  LABS:  CBC Recent Labs     08/29/13  0320   08/29/13  1855  08/30/13  0001  08/30/13  0405  WBC  22.7*   --    --   25.7*  24.3*  HGB  5.7*   < >  7.5*  7.0*  6.5*  HCT  15.8*   < >  21.1*  19.0*  18.1*  PLT  127*   --    --   124*  110*   < > = values in this interval not displayed.    BMET Recent Labs     08/28/13  1450  08/29/13  0320  08/30/13  0405  NA  145  149*  145  K  3.3*  3.4*  3.0*  CL  109  111  106  CO2  21  22  25   BUN  95*  91*  86*  CREATININE  2.68*  2.58*  2.54*  GLUCOSE  140*  225*  197*    Electrolytes Recent Labs     08/28/13  1450  08/29/13  0320  08/30/13  0405  CALCIUM  7.3*  7.1*  7.5*  MG   --   1.5  1.9  PHOS  3.8  3.8  4.4    Liver Enzymes Recent Labs     08/28/13  1450  08/29/13  0320  08/30/13  0405  AST   --   20   --   ALT   --   17   --   ALKPHOS   --   79   --   BILITOT   --   1.5*   --   ALBUMIN  1.5*  1.3*  1.3*   Glucose Recent Labs     08/29/13  1225  08/29/13   1624  08/29/13  1937  08/29/13  2338  08/30/13  0345  08/30/13  0735  GLUCAP  180*  232*  160*  156*  186*  132*    Imaging Ct Chest Wo Contrast  08/29/2013   CLINICAL DATA:  Hemoptysis  EXAM: CT CHEST WITHOUT CONTRAST  TECHNIQUE: Multidetector CT imaging of the chest was performed following the standard protocol without IV contrast.  COMPARISON:  Chest radiographs dated 08/29/2013  FINDINGS: Multifocal patchy ground-glass opacities throughout all lobes, with a perihilar distribution, suspicious for moderate interstitial edema. Underlying infection is difficult to exclude but considered less likely.  Moderate left and small to moderate right pleural effusions. Associated lower lobe opacities, likely atelectasis.  No pneumothorax.  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  Cardiomegaly. No pericardial effusion. Coronary atherosclerosis in the LAD.  Mildly prominent mediastinal nodes measuring up to 7 mm short axis (series 2/ image 24), likely reactive. No suspicious axillary lymphadenopathy.  Visualized upper abdomen is grossly unremarkable.  Degenerative changes of the visualized thoracolumbar spine.  IMPRESSION: Cardiomegaly with suspected moderate interstitial edema. Underlying infection is difficult to exclude but considered less likely.  Moderate left and small to moderate right pleural effusions. Associated lower lobe atelectasis.   Electronically Signed   By: Julian Hy M.D.   On: 08/29/2013 14:01   Dg Chest Port 1 View  08/29/2013   CLINICAL DATA:  Airspace disease  EXAM: PORTABLE CHEST - 1 VIEW  COMPARISON:  DG CHEST 1V PORT dated 08/28/2013; DG CHEST 1V PORT dated 08/22/2013; DG CHEST 1V PORT dated 08/14/2013; DG CHEST 1V PORT dated 08/07/2013  FINDINGS: Grossly unchanged cardiac silhouette and mediastinal contours. Stable position of support apparatus. No pneumothorax. Pulmonary vasculature remains indistinct. Minimally improved aeration of the lungs with persistent right upper and  bilateral lower lung heterogeneous opacities. Trace right-sided effusion is not excluded. Grossly unchanged bones.  IMPRESSION: 1.  Stable positioning of support apparatus.  No pneumothorax. 2. Minimally improved aeration of the lungs suggests minimally improved edema and/or atelectasis. 3. Residual bilateral airspace disease may represent residual alveolar pulmonary edema though underlying infection not excluded.   Electronically Signed  By: Sandi Mariscal M.D.   On: 08/29/2013 08:09    ASSESSMENT / PLAN:  PULMONARY A: Acute respiratory failure in setting of perforated bowel.  S/p Tracheostomy 1/22. Hemoptysis develop 2//09 2nd to Stenotrophomonas HCAP >> slightly improved 2/13. P: -continue vent support -limit trach suctioning -prn BD's -f/u CXR -defer bronchoscopy for now  CARDIOVASCULAR A: Septic shock from perforated bowel >> off pressors 2/02. Hx of A fib >> previously on amiodarone. P: -continue scheduled IV lopressor >> administration frequency increased 2/10  RENAL A: Acute kidney injury 2nd to ischemic bowel, shock >> started on HD January 2015 >> making urine. Anion gap metabolic acidosis >> resolved. Hypokalemia. P: -negative fluid balance as tolerated >> lasix ordered by renal -f/u and replace electrolytes as needed  GASTROINTESTINAL A: Perforated bowel. Protein calorie malnutrition. Melena develop 2/11 >> ?from anastamosis. P: -post-op care per [facility] -advance diet per [facility] >> started tube feeds 2/09, tolerating ok. -protonix for SUP -for nuclear scan 2/13 and ? Embolization if bleeding source localized -very high risk if he needs any additional abd surgeries  HEMATOLOGIC A: Anemia of critical illness and acute blood loss from GI source. P: -SCD for DVT prevention -f/u H/H after transfusion -transfuse for Hb < 7 or bleeding -f/u CBC in AM   INFECTIOUS A: Septic shock from bowel perforation >> improved. HCAP with Stenotrophomonas in sputum developed  2/09. Developed rash from vancomycin. P: -changed Abx to bactrim 2/12 -monitor WBC/fever curve  ENDOCRINE A: Hyperglycemia. P: -SSI  NEUROLOGIC A: Acute encephalopathy 2nd to sepsis >> improving. Deconditioning. P: -will need PT/OT at some point >> likely will need LTAC as he continues to improve.  SUMMARY: Respiratory status and hemoptysis shows mild improvement with change of Abx 2/12.  Tolerating pressure support 2/13.  Main concern is for source of GI bleeding which is likely anastomotic site >> if not able to do anything to control bleeding with IR, then further options are very limited >> do not think he could tolerate additional abdominal surgeries.  Updated family at bedside.  CC time 35 minutes.  Chesley Mires, MD [facility] Pulmonary/Critical Care 08/30/2013, 11:40 AM Pager:  [phone] After 3pm call: [phone]

## 2013-08-30 NOTE — Progress Notes (Signed)
eLink Physician-Brief Progress Note Patient Name: Jason Harmon DOB: 10-Jun-1941 MRN: [id number]  Date of Service  08/30/2013   HPI/Events of Note  Hgb drop to 6.5 from a drift down overnight from high of 9.4 @ 1300 2/12.  Continues to have hemoptysis.   eICU Interventions  Plan: Transfuse 1 u pRBC Post-transfusion CBC    Intervention Category Intermediate Interventions: Bleeding - evaluation and treatment with blood products  DETERDING,ELIZABETH 08/30/2013, 5:04 AM

## 2013-08-30 NOTE — Progress Notes (Signed)
Patient ID: Jason Harmon, male   DOB: 08-30-1940, 73 y.o.   MRN: [id number]   Subjective: hgb down once again to 6.5.  Melenotic stools and trach suctioning.  He is tachycardic.  BP stable.   Objective:  Vital signs:  Filed Vitals:   08/30/13 0700 08/30/13 0745 08/30/13 0751 08/30/13 0809  BP: 135/82  135/82   Pulse: 105  110 118  Temp:  98.4 F (36.9 C)  98.4 F (36.9 C)  TempSrc:  Axillary  Axillary  Resp: 28  34 25  Height:      Weight:      SpO2: 100%  100% 98%    Last BM Date: 08/29/13 (ostomy)  Intake/Output   Yesterday:  02/12 0701 - 02/13 0700 In: 6659 [I.V.:550; Blood:325; NG/GT:240; IV Piggyback:1650; TPN:1680] Out: 6520 [DJTTS:1779; Stool:925] This shift:    I/O last 3 completed shifts: In: 7342.5 [I.V.:1450; Blood:662.5; Other:300; NG/GT:360; IV TJQZESPQZ:3007] Out: 6226 [JFHLK:5625; WLSLH:7342]   Physical Exam:  General: Pt awake, mild distress  Abdomen: Soft. Nondistended. Midline wound is clean. Ostomy with melanotic stool.    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    Results:   Labs: Results for orders placed during the hospital encounter of 08/18/13 (from the past 48 hour(s))  PROTIME-INR     Status: Abnormal   Collection Time    08/28/13 11:30 AM      Result Value Ref Range   Prothrombin Time 16.5 (*) 11.6 - 15.2 seconds   INR 1.37  0.00 - 1.49  GLUCOSE, CAPILLARY     Status: Abnormal   Collection Time    08/28/13 11:45 AM      Result Value Ref Range   Glucose-Capillary 163 (*) 70 - 99 mg/dL  RENAL FUNCTION PANEL     Status: Abnormal   Collection Time    08/28/13  2:50 PM      Result Value Ref Range   Sodium 145  137 - 147 mEq/L   Potassium 3.3 (*) 3.7 - 5.3 mEq/L   Chloride 109  96 - 112 mEq/L   CO2 21  19 - 32 mEq/L   Glucose, Bld 140 (*) 70 - 99 mg/dL   BUN 95 (*) 6 - 23  mg/dL   Creatinine, Ser 2.68 (*) 0.50 - 1.35 mg/dL   Calcium 7.3 (*) 8.4 - 10.5 mg/dL   Phosphorus 3.8  2.3 - 4.6 mg/dL   Albumin 1.5 (*) 3.5 - 5.2 g/dL   GFR calc non Af Amer 23 (*) >90 mL/min   GFR calc Af Amer 26 (*) >90 mL/min   Comment: (NOTE)     The eGFR has been calculated using the CKD EPI equation.     This calculation has not been validated in all clinical situations.     eGFR's persistently <90 mL/min signify possible Chronic Kidney     Disease.  HEMOGLOBIN AND HEMATOCRIT, BLOOD     Status: Abnormal   Collection Time    08/28/13  2:50 PM      Result Value Ref Range   Hemoglobin 8.1 (*) 13.0 - 17.0 g/dL   Comment: POST TRANSFUSION SPECIMEN   HCT 22.6 (*) 39.0 - 52.0 %  GLUCOSE, CAPILLARY     Status: Abnormal   Collection Time    08/28/13  3:54 PM      Result Value Ref  Range   Glucose-Capillary 133 (*) 70 - 99 mg/dL  GLUCOSE, CAPILLARY     Status: Abnormal   Collection Time    08/28/13  7:30 PM      Result Value Ref Range   Glucose-Capillary 153 (*) 70 - 99 mg/dL   Comment 1 Documented in Chart     Comment 2 Notify RN    GLUCOSE, CAPILLARY     Status: Abnormal   Collection Time    08/28/13 11:36 PM      Result Value Ref Range   Glucose-Capillary 153 (*) 70 - 99 mg/dL   Comment 1 Documented in Chart     Comment 2 Notify RN    PROCALCITONIN     Status: None   Collection Time    08/29/13  3:20 AM      Result Value Ref Range   Procalcitonin 0.69     Comment:            Interpretation:     PCT > 0.5 ng/mL and <= 2 ng/mL:     Systemic infection (sepsis) is possible,     but other conditions are known to elevate     PCT as well.     (NOTE)             ICU PCT Algorithm               Non ICU PCT Algorithm        ----------------------------     ------------------------------             PCT < 0.25 ng/mL                 PCT < 0.1 ng/mL         Stopping of antibiotics            Stopping of antibiotics           strongly encouraged.               strongly  encouraged.        ----------------------------     ------------------------------           PCT level decrease by               PCT < 0.25 ng/mL           >= 80% from peak PCT           OR PCT 0.25 - 0.5 ng/mL          Stopping of antibiotics                                                 encouraged.         Stopping of antibiotics               encouraged.        ----------------------------     ------------------------------           PCT level decrease by              PCT >= 0.25 ng/mL           < 80% from peak PCT            AND PCT >= 0.5 ng/mL            Continuing antibiotics  encouraged.           Continuing antibiotics                encouraged.        ----------------------------     ------------------------------         PCT level increase compared          PCT > 0.5 ng/mL             with peak PCT AND              PCT >= 0.5 ng/mL             Escalation of antibiotics                                              strongly encouraged.          Escalation of antibiotics            strongly encouraged.  CBC     Status: Abnormal   Collection Time    08/29/13  3:20 AM      Result Value Ref Range   WBC 22.7 (*) 4.0 - 10.5 K/uL   RBC 1.79 (*) 4.22 - 5.81 MIL/uL   Hemoglobin 5.7 (*) 13.0 - 17.0 g/dL   Comment: DELTA CHECK NOTED     REPEATED TO VERIFY     CRITICAL RESULT CALLED TO, READ BACK BY AND VERIFIED WITH:     A. [facility] (RN) [phone] 08/29/2013 L. LOMAX   HCT 15.8 (*) 39.0 - 52.0 %   MCV 88.3  78.0 - 100.0 fL   MCH 31.8  26.0 - 34.0 pg   MCHC 36.1 (*) 30.0 - 36.0 g/dL   RDW 14.9  11.5 - 15.5 %   Platelets 127 (*) 150 - 400 K/uL  COMPREHENSIVE METABOLIC PANEL     Status: Abnormal   Collection Time    08/29/13  3:20 AM      Result Value Ref Range   Sodium 149 (*) 137 - 147 mEq/L   Potassium 3.4 (*) 3.7 - 5.3 mEq/L   Chloride 111  96 - 112 mEq/L   CO2 22  19 - 32 mEq/L   Glucose, Bld 225 (*) 70 - 99 mg/dL   BUN 91 (*)  6 - 23 mg/dL   Creatinine, Ser 2.58 (*) 0.50 - 1.35 mg/dL   Calcium 7.1 (*) 8.4 - 10.5 mg/dL   Total Protein 4.2 (*) 6.0 - 8.3 g/dL   Albumin 1.3 (*) 3.5 - 5.2 g/dL   AST 20  0 - 37 U/L   ALT 17  0 - 53 U/L   Alkaline Phosphatase 79  39 - 117 U/L   Total Bilirubin 1.5 (*) 0.3 - 1.2 mg/dL   GFR calc non Af Amer 24 (*) >90 mL/min   GFR calc Af Amer 27 (*) >90 mL/min   Comment: (NOTE)     The eGFR has been calculated using the CKD EPI equation.     This calculation has not been validated in all clinical situations.     eGFR's persistently <90 mL/min signify possible Chronic Kidney     Disease.  MAGNESIUM     Status: None   Collection Time    08/29/13  3:20 AM      Result Value Ref Range   Magnesium 1.5  1.5 -  2.5 mg/dL  PHOSPHORUS     Status: None   Collection Time    08/29/13  3:20 AM      Result Value Ref Range   Phosphorus 3.8  2.3 - 4.6 mg/dL  GLUCOSE, CAPILLARY     Status: Abnormal   Collection Time    08/29/13  3:31 AM      Result Value Ref Range   Glucose-Capillary 204 (*) 70 - 99 mg/dL   Comment 1 Documented in Chart     Comment 2 Notify RN    PREPARE RBC (CROSSMATCH)     Status: None   Collection Time    08/29/13  4:30 AM      Result Value Ref Range   Order Confirmation ORDER PROCESSED BY BLOOD BANK    GLUCOSE, CAPILLARY     Status: Abnormal   Collection Time    08/29/13  7:47 AM      Result Value Ref Range   Glucose-Capillary 187 (*) 70 - 99 mg/dL   Comment 1 Documented in Chart     Comment 2 Notify RN    GLUCOSE, CAPILLARY     Status: Abnormal   Collection Time    08/29/13 12:25 PM      Result Value Ref Range   Glucose-Capillary 180 (*) 70 - 99 mg/dL   Comment 1 Documented in Chart     Comment 2 Notify RN    HEMOGLOBIN AND HEMATOCRIT, BLOOD     Status: Abnormal   Collection Time    08/29/13  1:00 PM      Result Value Ref Range   Hemoglobin 9.4 (*) 13.0 - 17.0 g/dL   Comment: POST TRANSFUSION SPECIMEN   HCT 25.6 (*) 39.0 - 52.0 %  GLUCOSE, CAPILLARY      Status: Abnormal   Collection Time    08/29/13  4:24 PM      Result Value Ref Range   Glucose-Capillary 232 (*) 70 - 99 mg/dL   Comment 1 Documented in Chart     Comment 2 Notify RN    HEMOGLOBIN AND HEMATOCRIT, BLOOD     Status: Abnormal   Collection Time    08/29/13  6:55 PM      Result Value Ref Range   Hemoglobin 7.5 (*) 13.0 - 17.0 g/dL   Comment: REPEATED TO VERIFY     DELTA CHECK NOTED   HCT 21.1 (*) 39.0 - 52.0 %  GLUCOSE, CAPILLARY     Status: Abnormal   Collection Time    08/29/13  7:37 PM      Result Value Ref Range   Glucose-Capillary 160 (*) 70 - 99 mg/dL   Comment 1 Documented in Chart     Comment 2 Notify RN    GLUCOSE, CAPILLARY     Status: Abnormal   Collection Time    08/29/13 11:38 PM      Result Value Ref Range   Glucose-Capillary 156 (*) 70 - 99 mg/dL   Comment 1 Documented in Chart     Comment 2 Notify RN    CBC     Status: Abnormal   Collection Time    08/30/13 12:01 AM      Result Value Ref Range   WBC 25.7 (*) 4.0 - 10.5 K/uL   RBC 2.17 (*) 4.22 - 5.81 MIL/uL   Hemoglobin 7.0 (*) 13.0 - 17.0 g/dL   HCT 19.0 (*) 39.0 - 52.0 %   MCV 87.6  78.0 - 100.0 fL  MCH 32.3  26.0 - 34.0 pg   MCHC 36.8 (*) 30.0 - 36.0 g/dL   RDW 14.9  11.5 - 15.5 %   Platelets 124 (*) 150 - 400 K/uL  GLUCOSE, CAPILLARY     Status: Abnormal   Collection Time    08/30/13  3:45 AM      Result Value Ref Range   Glucose-Capillary 186 (*) 70 - 99 mg/dL   Comment 1 Documented in Chart     Comment 2 Notify RN    RENAL FUNCTION PANEL     Status: Abnormal   Collection Time    08/30/13  4:05 AM      Result Value Ref Range   Sodium 145  137 - 147 mEq/L   Potassium 3.0 (*) 3.7 - 5.3 mEq/L   Chloride 106  96 - 112 mEq/L   CO2 25  19 - 32 mEq/L   Glucose, Bld 197 (*) 70 - 99 mg/dL   BUN 86 (*) 6 - 23 mg/dL   Creatinine, Ser 2.54 (*) 0.50 - 1.35 mg/dL   Calcium 7.5 (*) 8.4 - 10.5 mg/dL   Phosphorus 4.4  2.3 - 4.6 mg/dL   Albumin 1.3 (*) 3.5 - 5.2 g/dL   GFR calc non Af  Amer 24 (*) >90 mL/min   GFR calc Af Amer 28 (*) >90 mL/min   Comment: (NOTE)     The eGFR has been calculated using the CKD EPI equation.     This calculation has not been validated in all clinical situations.     eGFR's persistently <90 mL/min signify possible Chronic Kidney     Disease.  MAGNESIUM     Status: None   Collection Time    08/30/13  4:05 AM      Result Value Ref Range   Magnesium 1.9  1.5 - 2.5 mg/dL  CBC     Status: Abnormal   Collection Time    08/30/13  4:05 AM      Result Value Ref Range   WBC 24.3 (*) 4.0 - 10.5 K/uL   RBC 2.05 (*) 4.22 - 5.81 MIL/uL   Hemoglobin 6.5 (*) 13.0 - 17.0 g/dL   Comment: REPEATED TO VERIFY     CRITICAL RESULT CALLED TO, READ BACK BY AND VERIFIED WITH:     L. MCATEE (RN) [phone] 08/30/2013 L. LOMAX   HCT 18.1 (*) 39.0 - 52.0 %   MCV 88.3  78.0 - 100.0 fL   MCH 31.7  26.0 - 34.0 pg   MCHC 35.9  30.0 - 36.0 g/dL   RDW 14.9  11.5 - 15.5 %   Platelets 110 (*) 150 - 400 K/uL   Comment: SPECIMEN CHECKED FOR CLOTS     REPEATED TO VERIFY     PLATELET COUNT CONFIRMED BY SMEAR  PREPARE RBC (CROSSMATCH)     Status: None   Collection Time    08/30/13  5:30 AM      Result Value Ref Range   Order Confirmation ORDER PROCESSED BY BLOOD BANK    TYPE AND SCREEN     Status: None   Collection Time    08/30/13  6:38 AM      Result Value Ref Range   ABO/RH(D) A POS     Antibody Screen NEG     Sample Expiration 09/02/2013     Unit Number [id number]     Blood Component Type RED CELLS,LR     Unit division 00     Status  of Unit ISSUED     Transfusion Status OK TO TRANSFUSE     Crossmatch Result Compatible    GLUCOSE, CAPILLARY     Status: Abnormal   Collection Time    08/30/13  7:35 AM      Result Value Ref Range   Glucose-Capillary 132 (*) 70 - 99 mg/dL   Comment 1 Notify RN      Imaging / Studies: Ct Chest Wo Contrast  08/29/2013   CLINICAL DATA:  Hemoptysis  EXAM: CT CHEST WITHOUT CONTRAST  TECHNIQUE: Multidetector CT imaging of the  chest was performed following the standard protocol without IV contrast.  COMPARISON:  Chest radiographs dated 08/29/2013  FINDINGS: Multifocal patchy ground-glass opacities throughout all lobes, with a perihilar distribution, suspicious for moderate interstitial edema. Underlying infection is difficult to exclude but considered less likely.  Moderate left and small to moderate right pleural effusions. Associated lower lobe opacities, likely atelectasis.  No pneumothorax.  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  Cardiomegaly. No pericardial effusion. Coronary atherosclerosis in the LAD.  Mildly prominent mediastinal nodes measuring up to 7 mm short axis (series 2/ image 24), likely reactive. No suspicious axillary lymphadenopathy.  Visualized upper abdomen is grossly unremarkable.  Degenerative changes of the visualized thoracolumbar spine.  IMPRESSION: Cardiomegaly with suspected moderate interstitial edema. Underlying infection is difficult to exclude but considered less likely.  Moderate left and small to moderate right pleural effusions. Associated lower lobe atelectasis.   Electronically Signed   By: Julian Hy M.D.   On: 08/29/2013 14:01   Dg Chest Port 1 View  08/29/2013   CLINICAL DATA:  Airspace disease  EXAM: PORTABLE CHEST - 1 VIEW  COMPARISON:  DG CHEST 1V PORT dated 08/28/2013; DG CHEST 1V PORT dated 08/22/2013; DG CHEST 1V PORT dated 08/14/2013; DG CHEST 1V PORT dated 08/07/2013  FINDINGS: Grossly unchanged cardiac silhouette and mediastinal contours. Stable position of support apparatus. No pneumothorax. Pulmonary vasculature remains indistinct. Minimally improved aeration of the lungs with persistent right upper and bilateral lower lung heterogeneous opacities. Trace right-sided effusion is not excluded. Grossly unchanged bones.  IMPRESSION: 1.  Stable positioning of support apparatus.  No pneumothorax. 2. Minimally improved aeration of the lungs suggests minimally improved edema and/or  atelectasis. 3. Residual bilateral airspace disease may represent residual alveolar pulmonary edema though underlying infection not excluded.   Electronically Signed   By: Sandi Mariscal M.D.   On: 08/29/2013 08:09    Medications / Allergies: per chart  Antibiotics: Anti-infectives   Start     Dose/Rate Route Frequency Ordered Stop   08/29/13 1200  sulfamethoxazole-trimethoprim (BACTRIM) 400 mg of trimethoprim in dextrose 5 % 500 mL IVPB     400 mg of trimethoprim 350 mL/hr over 90 Minutes Intravenous Every 12 hours 08/29/13 1149     08/28/13 1900  vancomycin (VANCOCIN) 1,250 mg in sodium chloride 0.9 % 250 mL IVPB  Status:  Discontinued     1,250 mg 166.7 mL/hr over 90 Minutes Intravenous Every 48 hours 08/26/13 1758 08/27/13 0900   08/27/13 0930  linezolid (ZYVOX) IVPB 600 mg  Status:  Discontinued     600 mg 300 mL/hr over 60 Minutes Intravenous Every 12 hours 08/27/13 0900 08/29/13 1057   08/26/13 1900  vancomycin (VANCOCIN) 1,750 mg in sodium chloride 0.9 % 500 mL IVPB     1,750 mg 250 mL/hr over 120 Minutes Intravenous  Once 08/26/13 1758 08/26/13 2044   08/18/13 1400  piperacillin-tazobactam (ZOSYN) IVPB 2.25 g  Status:  Discontinued     2.25 g 100 mL/hr over 30 Minutes Intravenous 3 times per day 08/18/13 1124 08/29/13 1057   08/18/13 0500  piperacillin-tazobactam (ZOSYN) IVPB 3.375 g  Status:  Discontinued     3.375 g 12.5 mL/hr over 240 Minutes Intravenous 3 times per day 08/18/13 0434 08/18/13 1124      Assessment/Plan  1.Pod 12/10 reexploration with small bowel anastmosis, colostomy and g tube  2. VDRF, intermittent trach collar vs vent  3. Anemia, ABL secondary to melena from GI tract and trach  4. PCM/TNA/TF   H&H are down once again.  We will proceed with angio for suspected bleeding from the anastomosis.  Already transfusing 1 unit of PRBCs, repeat CBC post perfusion   Erby Pian, [facility] Surgery Pager [phone] Office  [phone]  08/30/2013 8:24 AM

## 2013-08-30 NOTE — Progress Notes (Signed)
1215-1500 pt transported on vent 100%, rest mode to/from arteriogram w/ no apparent complications. Pt returned to ICU room and placed back on cpap/ps vent wean, 40%.  No distress noted, VSS.

## 2013-08-31 ENCOUNTER — Inpatient Hospital Stay (HOSPITAL_COMMUNITY): Payer: Medicare Other

## 2013-08-31 LAB — CBC
HCT: 26.3 % — ABNORMAL LOW (ref 39.0–52.0)
HEMATOCRIT: 17.8 % — AB (ref 39.0–52.0)
HEMOGLOBIN: 9.3 g/dL — AB (ref 13.0–17.0)
Hemoglobin: 6.4 g/dL — CL (ref 13.0–17.0)
MCH: 31.5 pg (ref 26.0–34.0)
MCH: 31.6 pg (ref 26.0–34.0)
MCHC: 36 g/dL (ref 30.0–36.0)
MCHC: 35.4 g/dL (ref 30.0–36.0)
MCV: 87.7 fL (ref 78.0–100.0)
MCV: 89.5 fL (ref 78.0–100.0)
PLATELETS: 124 10*3/uL — AB (ref 150–400)
PLATELETS: 124 10*3/uL — AB (ref 150–400)
RBC: 2.03 MIL/uL — ABNORMAL LOW (ref 4.22–5.81)
RBC: 2.94 MIL/uL — AB (ref 4.22–5.81)
RDW: 14.9 % (ref 11.5–15.5)
RDW: 15 % (ref 11.5–15.5)
WBC: 19.7 10*3/uL — ABNORMAL HIGH (ref 4.0–10.5)
WBC: 20.3 10*3/uL — AB (ref 4.0–10.5)

## 2013-08-31 LAB — RENAL FUNCTION PANEL
ALBUMIN: 1.2 g/dL — AB (ref 3.5–5.2)
BUN: 87 mg/dL — ABNORMAL HIGH (ref 6–23)
CALCIUM: 7.3 mg/dL — AB (ref 8.4–10.5)
CO2: 25 mEq/L (ref 19–32)
Chloride: 106 mEq/L (ref 96–112)
Creatinine, Ser: 2.5 mg/dL — ABNORMAL HIGH (ref 0.50–1.35)
GFR calc non Af Amer: 25 mL/min — ABNORMAL LOW (ref >90)
GFR, EST AFRICAN AMERICAN: 28 mL/min — AB (ref >90)
GLUCOSE: 162 mg/dL — AB (ref 70–99)
PHOSPHORUS: 4.8 mg/dL — AB (ref 2.3–4.6)
POTASSIUM: 3.2 meq/L — AB (ref 3.7–5.3)
SODIUM: 145 meq/L (ref 137–147)

## 2013-08-31 LAB — GLUCOSE, CAPILLARY
Glucose-Capillary: 113 mg/dL — ABNORMAL HIGH (ref 70–99)
Glucose-Capillary: 165 mg/dL — ABNORMAL HIGH (ref 70–99)
Glucose-Capillary: 125 mg/dL — ABNORMAL HIGH (ref 70–99)
Glucose-Capillary: 144 mg/dL — ABNORMAL HIGH (ref 70–99)
Glucose-Capillary: 189 mg/dL — ABNORMAL HIGH (ref 70–99)
Glucose-Capillary: 112 mg/dL — ABNORMAL HIGH (ref 70–99)

## 2013-08-31 LAB — PREPARE RBC (CROSSMATCH)

## 2013-08-31 MED ORDER — TRACE MINERALS CR-CU-F-FE-I-MN-MO-SE-ZN IV SOLN
INTRAVENOUS | Status: AC
  Administered 2013-08-31: 18:00:00 via INTRAVENOUS
  Filled 2013-08-31: qty 2000

## 2013-08-31 MED ORDER — POTASSIUM CHLORIDE 10 MEQ/100ML IV SOLN
10.0000 meq | INTRAVENOUS | Status: DC
  Filled 2013-08-31: qty 100

## 2013-08-31 MED ORDER — FAT EMULSION 20 % IV EMUL
250.0000 mL | INTRAVENOUS | Status: AC
  Administered 2013-08-31: 250 mL via INTRAVENOUS
  Filled 2013-08-31: qty 250

## 2013-08-31 MED ORDER — POTASSIUM CHLORIDE 10 MEQ/50ML IV SOLN
10.0000 meq | INTRAVENOUS | Status: AC
  Administered 2013-08-31 (×4): 10 meq via INTRAVENOUS
  Filled 2013-08-31: qty 50

## 2013-08-31 NOTE — Progress Notes (Signed)
Patient ID: Jason Harmon, male   DOB: 01-14-1941, 73 y.o.   MRN: [id number]   Subjective: hgb down once.  He is tachycardic.  BP stable. No abd pain  Objective:  Vital signs:  Filed Vitals:   08/31/13 0845 08/31/13 0900 08/31/13 0930 08/31/13 0945  BP:  157/90    Pulse: 119 118 116 116  Temp: 97.9 F (36.6 C)   98.8 F (37.1 C)  TempSrc: Oral   Oral  Resp: '27 26 26 27  ' Height:      Weight:      SpO2: 91% 94% 94% 95%    Last BM Date: 08/30/13 (ostomy)  Intake/Output   Yesterday:  02/13 0701 - 02/14 0700 In: 4097.3 [I.V.:210; Blood:868.7; NG/GT:109.8; IV Piggyback:1225; ZHG:9924] Out: 4510 [Urine:3960; Stool:550] This shift:  Total I/O In: 642.5 [I.V.:60; Blood:162.5; Other:30; NG/GT:30; IV Piggyback:150; TPN:210] Out: 725 [Urine:725] I/O last 3 completed shifts: In: 5458.5 [I.V.:410; Blood:868.7; Other:120; NG/GT:229.8; IV Piggyback:1800] Out: 8235 [QASTM:1962; IWLNL:8921] Total I/O In: 642.5 [I.V.:60; Blood:162.5; Other:30; NG/GT:30; IV Piggyback:150; TPN:210] Out: 725 [Urine:725] Physical Exam:  General: Pt awake, mild distress  Abdomen: Soft. Nondistended. Midline wound is clean. Ostomy with melanotic stool.    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    Results:   Labs: Results for orders placed during the hospital encounter of 08/18/13 (from the past 48 hour(s))  GLUCOSE, CAPILLARY     Status: Abnormal   Collection Time    08/29/13 12:25 PM      Result Value Ref Range   Glucose-Capillary 180 (*) 70 - 99 mg/dL   Comment 1 Documented in Chart     Comment 2 Notify RN    HEMOGLOBIN AND HEMATOCRIT, BLOOD     Status: Abnormal   Collection Time    08/29/13  1:00 PM      Result Value Ref Range   Hemoglobin 9.4 (*) 13.0 - 17.0 g/dL   Comment: POST TRANSFUSION SPECIMEN   HCT 25.6 (*) 39.0 - 52.0 %   GLUCOSE, CAPILLARY     Status: Abnormal   Collection Time    08/29/13  4:24 PM      Result Value Ref Range   Glucose-Capillary 232 (*) 70 - 99 mg/dL   Comment 1 Documented in Chart     Comment 2 Notify RN    HEMOGLOBIN AND HEMATOCRIT, BLOOD     Status: Abnormal   Collection Time    08/29/13  6:55 PM      Result Value Ref Range   Hemoglobin 7.5 (*) 13.0 - 17.0 g/dL   Comment: REPEATED TO VERIFY     DELTA CHECK NOTED   HCT 21.1 (*) 39.0 - 52.0 %  GLUCOSE, CAPILLARY     Status: Abnormal   Collection Time    08/29/13  7:37 PM      Result Value Ref Range   Glucose-Capillary 160 (*) 70 - 99 mg/dL   Comment 1 Documented in Chart     Comment 2 Notify RN    GLUCOSE, CAPILLARY     Status: Abnormal   Collection Time    08/29/13 11:38 PM      Result Value Ref Range   Glucose-Capillary 156 (*) 70 - 99 mg/dL   Comment 1 Documented in Chart     Comment 2 Notify RN    CBC  Status: Abnormal   Collection Time    08/30/13 12:01 AM      Result Value Ref Range   WBC 25.7 (*) 4.0 - 10.5 K/uL   RBC 2.17 (*) 4.22 - 5.81 MIL/uL   Hemoglobin 7.0 (*) 13.0 - 17.0 g/dL   HCT 19.0 (*) 39.0 - 52.0 %   MCV 87.6  78.0 - 100.0 fL   MCH 32.3  26.0 - 34.0 pg   MCHC 36.8 (*) 30.0 - 36.0 g/dL   RDW 14.9  11.5 - 15.5 %   Platelets 124 (*) 150 - 400 K/uL  GLUCOSE, CAPILLARY     Status: Abnormal   Collection Time    08/30/13  3:45 AM      Result Value Ref Range   Glucose-Capillary 186 (*) 70 - 99 mg/dL   Comment 1 Documented in Chart     Comment 2 Notify RN    RENAL FUNCTION PANEL     Status: Abnormal   Collection Time    08/30/13  4:05 AM      Result Value Ref Range   Sodium 145  137 - 147 mEq/L   Potassium 3.0 (*) 3.7 - 5.3 mEq/L   Chloride 106  96 - 112 mEq/L   CO2 25  19 - 32 mEq/L   Glucose, Bld 197 (*) 70 - 99 mg/dL   BUN 86 (*) 6 - 23 mg/dL   Creatinine, Ser 2.54 (*) 0.50 - 1.35 mg/dL   Calcium 7.5 (*) 8.4 - 10.5 mg/dL   Phosphorus 4.4  2.3 - 4.6 mg/dL   Albumin 1.3 (*) 3.5 - 5.2  g/dL   GFR calc non Af Amer 24 (*) >90 mL/min   GFR calc Af Amer 28 (*) >90 mL/min   Comment: (NOTE)     The eGFR has been calculated using the CKD EPI equation.     This calculation has not been validated in all clinical situations.     eGFR's persistently <90 mL/min signify possible Chronic Kidney     Disease.  MAGNESIUM     Status: None   Collection Time    08/30/13  4:05 AM      Result Value Ref Range   Magnesium 1.9  1.5 - 2.5 mg/dL  CBC     Status: Abnormal   Collection Time    08/30/13  4:05 AM      Result Value Ref Range   WBC 24.3 (*) 4.0 - 10.5 K/uL   RBC 2.05 (*) 4.22 - 5.81 MIL/uL   Hemoglobin 6.5 (*) 13.0 - 17.0 g/dL   Comment: REPEATED TO VERIFY     CRITICAL RESULT CALLED TO, READ BACK BY AND VERIFIED WITH:     L. MCATEE (RN) [phone] 08/30/2013 L. LOMAX   HCT 18.1 (*) 39.0 - 52.0 %   MCV 88.3  78.0 - 100.0 fL   MCH 31.7  26.0 - 34.0 pg   MCHC 35.9  30.0 - 36.0 g/dL   RDW 14.9  11.5 - 15.5 %   Platelets 110 (*) 150 - 400 K/uL   Comment: SPECIMEN CHECKED FOR CLOTS     REPEATED TO VERIFY     PLATELET COUNT CONFIRMED BY SMEAR  PREPARE RBC (CROSSMATCH)     Status: None   Collection Time    08/30/13  5:30 AM      Result Value Ref Range   Order Confirmation ORDER PROCESSED BY BLOOD BANK    TYPE AND SCREEN     Status:  None   Collection Time    08/30/13  6:38 AM      Result Value Ref Range   ABO/RH(D) A POS     Antibody Screen NEG     Sample Expiration 09/02/2013     Unit Number [id number]     Blood Component Type RED CELLS,LR     Unit division 00     Status of Unit ISSUED     Transfusion Status OK TO TRANSFUSE     Crossmatch Result Compatible     Unit Number [id number]     Blood Component Type RED CELLS,LR     Unit division 00     Status of Unit ISSUED     Transfusion Status OK TO TRANSFUSE     Crossmatch Result Compatible     Unit Number [id number]     Blood Component Type RED CELLS,LR     Unit division 00     Status of Unit ISSUED      Transfusion Status OK TO TRANSFUSE     Crossmatch Result Compatible    GLUCOSE, CAPILLARY     Status: Abnormal   Collection Time    08/30/13  7:35 AM      Result Value Ref Range   Glucose-Capillary 132 (*) 70 - 99 mg/dL   Comment 1 Notify RN    GLUCOSE, CAPILLARY     Status: Abnormal   Collection Time    08/30/13 11:42 AM      Result Value Ref Range   Glucose-Capillary 147 (*) 70 - 99 mg/dL   Comment 1 Notify RN    CBC     Status: Abnormal   Collection Time    08/30/13  3:00 PM      Result Value Ref Range   WBC 24.6 (*) 4.0 - 10.5 K/uL   RBC 2.46 (*) 4.22 - 5.81 MIL/uL   Hemoglobin 7.9 (*) 13.0 - 17.0 g/dL   Comment: REPEATED TO VERIFY     POST TRANSFUSION SPECIMEN   HCT 21.9 (*) 39.0 - 52.0 %   MCV 89.0  78.0 - 100.0 fL   MCH 32.1  26.0 - 34.0 pg   MCHC 36.1 (*) 30.0 - 36.0 g/dL   RDW 14.8  11.5 - 15.5 %   Platelets 123 (*) 150 - 400 K/uL  GLUCOSE, CAPILLARY     Status: Abnormal   Collection Time    08/30/13  4:11 PM      Result Value Ref Range   Glucose-Capillary 151 (*) 70 - 99 mg/dL   Comment 1 Notify RN    GLUCOSE, CAPILLARY     Status: Abnormal   Collection Time    08/30/13  9:13 PM      Result Value Ref Range   Glucose-Capillary 138 (*) 70 - 99 mg/dL  GLUCOSE, CAPILLARY     Status: Abnormal   Collection Time    08/31/13 12:23 AM      Result Value Ref Range   Glucose-Capillary 113 (*) 70 - 99 mg/dL  RENAL FUNCTION PANEL     Status: Abnormal   Collection Time    08/31/13  3:45 AM      Result Value Ref Range   Sodium 145  137 - 147 mEq/L   Potassium 3.2 (*) 3.7 - 5.3 mEq/L   Chloride 106  96 - 112 mEq/L   CO2 25  19 - 32 mEq/L   Glucose, Bld 162 (*) 70 - 99 mg/dL   BUN 87 (*) 6 -  23 mg/dL   Creatinine, Ser 2.50 (*) 0.50 - 1.35 mg/dL   Calcium 7.3 (*) 8.4 - 10.5 mg/dL   Phosphorus 4.8 (*) 2.3 - 4.6 mg/dL   Albumin 1.2 (*) 3.5 - 5.2 g/dL   GFR calc non Af Amer 25 (*) >90 mL/min   GFR calc Af Amer 28 (*) >90 mL/min   Comment: (NOTE)     The eGFR has been  calculated using the CKD EPI equation.     This calculation has not been validated in all clinical situations.     eGFR's persistently <90 mL/min signify possible Chronic Kidney     Disease.  CBC     Status: Abnormal   Collection Time    08/31/13  3:45 AM      Result Value Ref Range   WBC 19.7 (*) 4.0 - 10.5 K/uL   RBC 2.03 (*) 4.22 - 5.81 MIL/uL   Hemoglobin 6.4 (*) 13.0 - 17.0 g/dL   Comment: REPEATED TO VERIFY     CRITICAL RESULT CALLED TO, READ BACK BY AND VERIFIED WITH:     L. MCATEE (RN) [phone] 08/31/2013 L. LOMAX   HCT 17.8 (*) 39.0 - 52.0 %   MCV 87.7  78.0 - 100.0 fL   MCH 31.5  26.0 - 34.0 pg   MCHC 36.0  30.0 - 36.0 g/dL   Comment: CORRECTED FOR COLD AGGLUTININS   RDW 14.9  11.5 - 15.5 %   Platelets 124 (*) 150 - 400 K/uL  GLUCOSE, CAPILLARY     Status: Abnormal   Collection Time    08/31/13  3:47 AM      Result Value Ref Range   Glucose-Capillary 165 (*) 70 - 99 mg/dL   Comment 1 Documented in Chart     Comment 2 Notify RN    PREPARE RBC (CROSSMATCH)     Status: None   Collection Time    08/31/13  6:00 AM      Result Value Ref Range   Order Confirmation ORDER PROCESSED BY BLOOD BANK    GLUCOSE, CAPILLARY     Status: Abnormal   Collection Time    08/31/13  7:51 AM      Result Value Ref Range   Glucose-Capillary 125 (*) 70 - 99 mg/dL    Imaging / Studies: Ct Chest Wo Contrast  08/29/2013   CLINICAL DATA:  Hemoptysis  EXAM: CT CHEST WITHOUT CONTRAST  TECHNIQUE: Multidetector CT imaging of the chest was performed following the standard protocol without IV contrast.  COMPARISON:  Chest radiographs dated 08/29/2013  FINDINGS: Multifocal patchy ground-glass opacities throughout all lobes, with a perihilar distribution, suspicious for moderate interstitial edema. Underlying infection is difficult to exclude but considered less likely.  Moderate left and small to moderate right pleural effusions. Associated lower lobe opacities, likely atelectasis.  No pneumothorax.   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  Cardiomegaly. No pericardial effusion. Coronary atherosclerosis in the LAD.  Mildly prominent mediastinal nodes measuring up to 7 mm short axis (series 2/ image 24), likely reactive. No suspicious axillary lymphadenopathy.  Visualized upper abdomen is grossly unremarkable.  Degenerative changes of the visualized thoracolumbar spine.  IMPRESSION: Cardiomegaly with suspected moderate interstitial edema. Underlying infection is difficult to exclude but considered less likely.  Moderate left and small to moderate right pleural effusions. Associated lower lobe atelectasis.   Electronically Signed   By: Julian Hy M.D.   On: 08/29/2013 14:01   Nm Gi Blood Loss  08/30/2013  CLINICAL DATA:  Dark blood in the patient's ostomy bag, likely related to breakdown of an enteroenteric anastomosis in the small bowel.  EXAM: NUCLEAR MEDICINE GASTROINTESTINAL BLEEDING SCAN  TECHNIQUE: Sequential abdominal images were obtained following intravenous administration of Tc-13mlabeled red blood cells.  COMPARISON:  CT abdomen and pelvis 08/17/2013. No prior nuclear imaging.  RADIOPHARMACEUTICALS:  25 mCi ULTRATAG TECHNETIUM TC 44M-LABELED RED BLOOD CELLS IV  FINDINGS: Abnormal activity to the right of midline in the mid abdomen, at or near the level of the pelvic brim, which progresses in a pattern that is consistent with a small bowel source. Based on the anatomy on the prior CT, this likely corresponds to the enteroenteric anastomosis. The small bowel in this region on the prior CT was abnormal with marked wall thickening consistent with severe inflammation.  IMPRESSION: Active GI bleeding from a loop of small bowel to the right of midline at or near the level of the pelvic brim, likely corresponding to the enteroenteric anastomosis based on the anatomy on the recent CT scan.  These results were called by telephone at the time of interpretation on 08/30/2013 at 3:10 PM to Dr.  BGeorganna Skeans, who verbally acknowledged these results.   Electronically Signed   By: TEvangeline DakinM.D.   On: 08/30/2013 15:15   Dg Chest Port 1 View  08/31/2013   CLINICAL DATA:  Pneumonia and CHF.  EXAM: PORTABLE CHEST - 1 VIEW  COMPARISON:  DG CHEST 1V PORT dated 08/29/2013; CT CHEST W/O CM dated 08/29/2013; DG CHEST 1V PORT dated 08/28/2013; DG CHEST 1V PORT dated 08/26/2013  FINDINGS: Tracheostomy shows stable positioning. Lung shows significant increase in airspace disease bilaterally, especially on the right. Findings likely reflect pulmonary edema but may also be due to pneumonia/aspiration. No pneumothorax or significant pleural effusions are identified. Central line positioning is stable. The heart size remains normal.  IMPRESSION: Significant worsening of bilateral pulmonary airspace disease, right greater than left.   Electronically Signed   By: GAletta EdouardM.D.   On: 08/31/2013 08:26    Medications / Allergies: per chart  Antibiotics: Anti-infectives   Start     Dose/Rate Route Frequency Ordered Stop   08/29/13 1200  sulfamethoxazole-trimethoprim (BACTRIM) 400 mg of trimethoprim in dextrose 5 % 500 mL IVPB     400 mg of trimethoprim 350 mL/hr over 90 Minutes Intravenous Every 12 hours 08/29/13 1149     08/28/13 1900  vancomycin (VANCOCIN) 1,250 mg in sodium chloride 0.9 % 250 mL IVPB  Status:  Discontinued     1,250 mg 166.7 mL/hr over 90 Minutes Intravenous Every 48 hours 08/26/13 1758 08/27/13 0900   08/27/13 0930  linezolid (ZYVOX) IVPB 600 mg  Status:  Discontinued     600 mg 300 mL/hr over 60 Minutes Intravenous Every 12 hours 08/27/13 0900 08/29/13 1057   08/26/13 1900  vancomycin (VANCOCIN) 1,750 mg in sodium chloride 0.9 % 500 mL IVPB     1,750 mg 250 mL/hr over 120 Minutes Intravenous  Once 08/26/13 1758 08/26/13 2044   08/18/13 1400  piperacillin-tazobactam (ZOSYN) IVPB 2.25 g  Status:  Discontinued     2.25 g 100 mL/hr over 30 Minutes Intravenous 3 times per day  08/18/13 1124 08/29/13 1057   08/18/13 0500  piperacillin-tazobactam (ZOSYN) IVPB 3.375 g  Status:  Discontinued     3.375 g 12.5 mL/hr over 240 Minutes Intravenous 3 times per day 08/18/13 0434 08/18/13 1124      Assessment/Plan  1.Pod 13/11  reexploration with small bowel anastmosis, colostomy and g tube  2. VDRF, intermittent trach collar vs vent  3. Anemia, ABL secondary to melena from GI tract and trach: s/p neg angio yesterday. Hgb dwn again today 4. PCM/TNA/TF   H&H are down once again. Already transfusing 1 unit of PRBCs, repeat CBC post perfusion.  Check coags and repleat as needed  Rosario Adie, MD  [facility]   08/31/2013 11:47 AM

## 2013-08-31 NOTE — Progress Notes (Signed)
eLink Physician-Brief Progress Note Patient Name: Jason Harmon DOB: 11/23/1940 MRN: [id number]  Date of Service  08/31/2013   HPI/Events of Note  Hypokalemia and anemia Hgb 6.2.     eICU Interventions  40 meq KCL IV Transfuse 2 units PRBC's   Intervention Category Minor Interventions: Electrolytes abnormality - evaluation and management  Mauri Brooklyn, P 08/31/2013, 5:24 AM

## 2013-08-31 NOTE — Progress Notes (Signed)
PULMONARY  / CRITICAL CARE MEDICINE  Name: Jason Harmon MRN: [id number] DOB: 1941-02-27    ADMISSION DATE:  08/18/2013  CHIEF COMPLAINT:  Bowel perforation  BRIEF PATIENT DESCRIPTION:  73 yo male with SBO/ischemic bowel s/p resection with subsequent complicated medical course including respiratory failure and inability to wean from ventilator,[facility] with bowel perforation at anastomosis site.  SIGNIFICANT EVENTS: 1/22 Tracheostomy 2/01 Transfer from [facility], to ER, to [facility] ICU.  CCS consulted 2/1 Exploratory laparotomy with segmental resection of small bowel anastomosis and sigmoid colon with placement of abdominal wound vac 2/02 Off pressors, did not tolerate HD 2/4 small bowel anastomosis with gastrostomy & End sigmoid colostomy 2/8 ATC x 24h 2/9 Hemoptysis, anemia >> PRBC transfusion; CXR with new infiltrate 2/10 Red rash from vancomycin, increased WOB 2/12 2 units PRBC transfusion 2/13 Transfuse PRBC 2/13 GI Nuclear scan -: Active GI bleeding from a loop of small bowel to the right of midline at or near the level of the pelvic brim, likely corresponding to the enteroenteric anastomosis   2/13 IR angiogram-no evidence of active bleed or AVM .   STUDIES:  CT A/P with contrast 1/31 >>  bowel perforation, likely anastomotic breakdown at proximal enteroenterostomy with pneumoperitoneum and spilling of oral contrast, moderate ascites with peritonitis CT chest 2/12 >> b/l GGO, Lt > Rt pleural effusions  LINES / TUBES: Tracheostomy 1/22>>> R IJ HD catheter 1/12>>>  CULTURES: Sputum 2/9 >> Moderate Stenotrophomonas maltophilia  ANTIBIOTICS: Zosyn 2/1 >> 2/12 Vancomycin 2/9 >> 2/10 Linezolid 2/10 >> 2/12 Bactrim 2/12 >>   SUBJECTIVE: Still has sputum with blood, but less.   Continues to have melena and needing PRBC's. Unable to tolerate trach collar d/t increased wob   PHYSICAL EXAM VITAL SIGNS: Temp:  [97.8 F (36.6 C)-98.9 F (37.2 C)] 98.2 F (36.8 C)  (02/14 1203) Pulse Rate:  [95-120] 108 (02/14 1300) Resp:  [19-30] 28 (02/14 1300) BP: (77-157)/(52-104) 136/83 mmHg (02/14 1300) SpO2:  [91 %-100 %] 97 % (02/14 1300) FiO2 (%):  [40 %] 40 % (02/14 1200) Weight:  [80.5 kg (177 lb 7.5 oz)] 80.5 kg (177 lb 7.5 oz) (02/14 0500) VENT SETTINGS: Vent Mode:  [-] CPAP;PSV FiO2 (%):  [40 %] 40 % Set Rate:  [24 bmp] 24 bmp Vt Set:  [600 mL] 600 mL PEEP:  [8 cmH20] 8 cmH20 Pressure Support:  [10 cmH20] 10 cmH20 Plateau Pressure:  [24 cmH20-28 cmH20] 27 cmH20 INTAKE / OUTPUT: Intake/Output     02/13 0701 - 02/14 0700 02/14 0701 - 02/15 0700   I.V. (mL/kg) 210 (2.6) 120 (1.5)   Blood 868.7 162.5   Other 60 150   NG/GT 109.8 60   IV Piggyback 1225 150   TPN 1190 420   Total Intake(mL/kg) 3663.5 (45.5) 1062.5 (13.2)   Urine (mL/kg/hr) 3960 (2) 1305 (2.2)   Stool 550 (0.3)    Total Output 4510 1305   Net -846.5 -242.5          PHYSICAL EXAMINATION: General: ill appearing, lying in bed on vent. Neuro: somnolent, f/c intermittently  HEENT:  Trach site with mild bloody secretions Cardiovascular:  Tachycardic, regular.  No M/R/G. Lungs: scant  rhonchi, no wheeze Abdomen:  Dressings in place.  Melanotic appearing drainage in ostomy bag.  BS+  Musculoskeletal:  1-2+ edema, non-pitting. Skin:  Mid abd wound /dsg intact dry /clean , no rash  LABS:  CBC  Recent Labs Lab 08/30/13 1500 08/31/13 0345 08/31/13 1510  WBC 24.6* 19.7* 20.3*  HGB 7.9* 6.4* 9.3*  HCT 21.9* 17.8* 26.3*  PLT 123* 124* 124*   Coag's  Recent Labs Lab 08/26/13 1215 08/28/13 1130  APTT 37  --   INR 1.40 1.37   BMET  Recent Labs Lab 08/29/13 0320 08/30/13 0405 08/31/13 0345  NA 149* 145 145  K 3.4* 3.0* 3.2*  CL 111 106 106  CO2 22 25 25   BUN 91* 86* 87*  CREATININE 2.58* 2.54* 2.50*  GLUCOSE 225* 197* 162*   Electrolytes  Recent Labs Lab 08/27/13 0424  08/29/13 0320 08/30/13 0405 08/31/13 0345  CALCIUM 7.5*  < > 7.1* 7.5* 7.3*  MG 1.6   --  1.5 1.9  --   PHOS  --   < > 3.8 4.4 4.8*  < > = values in this interval not displayed. Sepsis Markers  Recent Labs Lab 08/27/13 0958 08/28/13 0413 08/29/13 0320  LATICACIDVEN 1.2  --   --   PROCALCITON 0.55 0.44 0.69   ABG  Recent Labs Lab 08/27/13 0916  PHART 7.358  PCO2ART 25.6*  PO2ART 68.0*   Liver Enzymes  Recent Labs Lab 08/26/13 0347  08/29/13 0320 08/30/13 0405 08/31/13 0345  AST 21  --  20  --   --   ALT 20  --  17  --   --   ALKPHOS 101  --  79  --   --   BILITOT 1.0  --  1.5*  --   --   ALBUMIN 1.4*  < > 1.3* 1.3* 1.2*  < > = values in this interval not displayed. Cardiac Enzymes No results found for this basename: TROPONINI, PROBNP,  in the last 168 hours Glucose  Recent Labs Lab 08/30/13 1611 08/30/13 2113 08/31/13 0023 08/31/13 0347 08/31/13 0751 08/31/13 1204  GLUCAP 151* 138* 113* 165* 125* 144*     Imaging Nm Gi Blood Loss  08/30/2013   CLINICAL DATA:  Dark blood in the patient's ostomy bag, likely related to breakdown of an enteroenteric anastomosis in the small bowel.  EXAM: NUCLEAR MEDICINE GASTROINTESTINAL BLEEDING SCAN  TECHNIQUE: Sequential abdominal images were obtained following intravenous administration of Tc-44m labeled red blood cells.  COMPARISON:  CT abdomen and pelvis 08/17/2013. No prior nuclear imaging.  RADIOPHARMACEUTICALS:  25 mCi ULTRATAG TECHNETIUM TC 43M-LABELED RED BLOOD CELLS IV  FINDINGS: Abnormal activity to the right of midline in the mid abdomen, at or near the level of the pelvic brim, which progresses in a pattern that is consistent with a small bowel source. Based on the anatomy on the prior CT, this likely corresponds to the enteroenteric anastomosis. The small bowel in this region on the prior CT was abnormal with marked wall thickening consistent with severe inflammation.  IMPRESSION: Active GI bleeding from a loop of small bowel to the right of midline at or near the level of the pelvic brim, likely  corresponding to the enteroenteric anastomosis based on the anatomy on the recent CT scan.  These results were called by telephone at the time of interpretation on 08/30/2013 at 3:10 PM to Dr. Georganna Skeans , who verbally acknowledged these results.   Electronically Signed   By: Evangeline Dakin M.D.   On: 08/30/2013 15:15   Dg Chest Port 1 View  08/31/2013   CLINICAL DATA:  Pneumonia and CHF.  EXAM: PORTABLE CHEST - 1 VIEW  COMPARISON:  DG CHEST 1V PORT dated 08/29/2013; CT CHEST W/O CM dated 08/29/2013; DG CHEST 1V PORT dated 08/28/2013; DG CHEST 1V PORT  dated 08/26/2013  FINDINGS: Tracheostomy shows stable positioning. Lung shows significant increase in airspace disease bilaterally, especially on the right. Findings likely reflect pulmonary edema but may also be due to pneumonia/aspiration. No pneumothorax or significant pleural effusions are identified. Central line positioning is stable. The heart size remains normal.  IMPRESSION: Significant worsening of bilateral pulmonary airspace disease, right greater than left.   Electronically Signed   By: Aletta Edouard M.D.   On: 08/31/2013 08:26   ASSESSMENT / PLAN:  PULMONARY A: Acute respiratory failure in setting of perforated bowel.  S/p Tracheostomy 1/22. Hemoptysis develop 2//09 2nd to Stenotrophomonas HCAP >> slightly improved 2/14. P: -continue vent support-wean as tolerated -trach collar wean as tolerated  -limit trach suctioning -prn BD's -f/u CXR -cont abx   CARDIOVASCULAR A: Septic shock from perforated bowel >> resolved off pressors 2/02. Hx of A fib >> previously on amiodarone. P: -continue scheduled IV lopressor   RENAL A: Acute kidney injury 2nd to ischemic bowel, shock >> started on HD January 2015 >> good UOP, scr tr down . Anion gap metabolic acidosis >> resolved. Hypokalemia. P: -negative fluid balance as tolerated >> lasix ordered by renal -f/u and replace electrolytes as needed  GASTROINTESTINAL A: Perforated  bowel. Protein calorie malnutrition. Melena develop 2/11 >> ?from anastamosis. GI px  P: -post-op care per [facility] -advance diet per [facility] >> started tube feeds 2/09, tolerating ok. -protonix for SUP -for nuclear scan 2/13 and ? Embolization if bleeding source localized -very high risk if he needs any additional abd surgeries  HEMATOLOGIC A: Anemia of critical illness and acute blood loss from GI source. >GI nuclear scan +bleed>IR angiogram>no source found  P: -SCD for DVT prevention -f/u H/H after transfusion -transfuse for Hb < 7 or bleeding -f/u CBC in AM   INFECTIOUS A: Septic shock from bowel perforation >> improved. HCAP with Stenotrophomonas in sputum developed 2/09. Developed rash from vancomycin. P: -changed Abx to bactrim 2/12 -monitor WBC/fever curve  ENDOCRINE A: Hyperglycemia. P: -SSI  NEUROLOGIC A: Acute encephalopathy 2nd to sepsis >> improving. Deconditioning. P: -will need PT/OT at some point >> likely will need LTAC as he continues to improve.  Tammy Parrett NP-C  [facility]  [phone]   I have personally obtained history, examined patient, evaluated and interpreted laboratory and imaging results, reviewed medical records, formulated assessment / plan and placed orders.  CRITICAL CARE:  The patient is critically ill with multiple organ systems failure and requires high complexity decision making for assessment and support, frequent evaluation and titration of therapies, application of advanced monitoring technologies and extensive interpretation of multiple databases. Critical Care Time devoted to patient care services described in this note is 35 minutes.   Doree Fudge, MD Pulmonary and [place] Pager: [phone]  08/31/2013, 3:29 PM

## 2013-08-31 NOTE — Progress Notes (Signed)
Admit: 08/18/2013 LOS: 19  53M with dialysis dependent prolonged AKI- but no HD since 2/2, with chronic ventilatory failure, small bowel perforation (repaired 08/20/13), abdominal septic shock.    Subjective:  Ended up getting mesenteric angiogram for positive nuclear study- unfortunately no source of bleeding found.  hgb down this AM- UOP still good and renal function stable.   02/13 0701 - 02/14 0700 In: 3563.5 [I.V.:190; Blood:868.7; NG/GT:99.8; IV Piggyback:1225; TPN:1120] Out: 4510 [Urine:3960; Stool:550]  Filed Weights   08/29/13 0447 08/30/13 0500 08/31/13 0500  Weight: 81.6 kg (179 lb 14.3 oz) 81.3 kg (179 lb 3.7 oz) 80.5 kg (177 lb 7.5 oz)    Current meds: reviewed Current Labs: reviewed   Physical Exam:  Blood pressure 139/80, pulse 103, temperature 97.8 F (36.6 C), temperature source Oral, resp. rate 26, height 5\' 11"  (1.803 m), weight 80.5 kg (177 lb 7.5 oz), SpO2 99.00%. GEN: chronically ill appearing, Trach, more awake and alert but no commands- struggling to breath even on vent NT: Trach - HD cath out EYES: EOMI. Eyes open to noise, stimullus  CV: tachy, regular  PULM: +Bs b/l  ABD: wound vac at midline  SKIN: large ventral abd incision  EXT: 2+ pitting edema b/l  Assessment/Plan 1. Prolonged Dialysis Dependent AKI: Appears at this time to be liberated from RRT with sufficient but low GFR and adequate UOP.  No HD since 2/2 (which was abandoned early 2/2 hemodynamin instability).  Creatinine seeming to trend down slowly?  to follow closely.  Have added lasix even though is making plenty of urine- is getting nearly daily transfusions and is more in than out.  No indications for HD at present 2. Anemia: transfusions per CCM and surgery.  Have added ESA- now also ?bleeding- off blood thinners- nuc scan positive for bleeding- arteriogram did not find anything to embolize- unfortunate 3. Sptic shock and SB performation: per surgery and CCM.  Mild metabolic acidosis:  improved 5. Dispo- has only been Harm[place] month since he was in "good health" according to wife.  Feel like she would do whatever it takes at this time.  Fortunately, dialysis not needed for now.  6. Hypokalemia- gentle repletion- will give another 40 today - getting better- putting in tpn as well.  7. Hypertension- maybe due to positive fluid balance mostly due to blood- have increased lopressor- is better  Jason Harmon   08/31/2013, 8:02 AM   Recent Labs Lab 08/29/13 0320 08/30/13 0405 08/31/13 0345  NA 149* 145 145  K 3.4* 3.0* 3.2*  CL 111 106 106  CO2 22 25 25   GLUCOSE 225* 197* 162*  BUN 91* 86* 87*  CREATININE 2.58* 2.54* 2.50*  CALCIUM 7.1* 7.5* 7.3*  PHOS 3.8 4.4 4.8*    Recent Labs Lab 08/30/13 0405 08/30/13 1500 08/31/13 0345  WBC 24.3* 24.6* 19.7*  HGB 6.5* 7.9* 6.4*  HCT 18.1* 21.9* 17.8*  MCV 88.3 89.0 87.7  PLT 110* 123* 124*

## 2013-08-31 NOTE — Progress Notes (Signed)
CRITICAL VALUE ALERT  Critical value received:  hgb 6.4   Date of notification:  08/31/13  Time of notification:  0500  Critical value read back:yes  Nurse who received alert:  Chalmers Cater  MD notified (1st page):  Elink  Time of first page:  0500  MD notified (2nd page):  Time of second page:  Responding MD:   Time MD responded:

## 2013-08-31 NOTE — Progress Notes (Signed)
Pt desat to 86% on ATC, placed pt back on vent, cpap 10, 8 peep.  Sats 94-96%, RN aware.

## 2013-08-31 NOTE — Progress Notes (Signed)
PARENTERAL NUTRITION CONSULT NOTE - FOLLOW UP  Pharmacy Consult for TPN Indication: Perforated Viscous  No Known Allergies  Patient Measurements: Height: 5\' 11"  (180.3 cm) (5'11") Weight: 177 lb 7.5 oz (80.5 kg) IBW/kg (Calculated) : 75.3 Adjusted Body Weight:  Usual Weight:   Vital Signs: Temp: 97.9 F (36.6 C) (02/14 0845) Temp src: Oral (02/14 0845) BP: 150/104 mmHg (02/14 0802) Pulse Rate: 119 (02/14 0845) Intake/Output from previous day: 02/13 0701 - 02/14 0700 In: 3643.5 [I.V.:190; Blood:868.7; NG/GT:109.8; IV Piggyback:1225; QMV:7846] Out: 4510 [Urine:3960; Stool:550] Intake/Output from this shift: Total I/O In: 372.5 [Blood:162.5; Other:30; NG/GT:10; IV Piggyback:100; TPN:70] Out: 225 [Urine:225]  Labs:  Recent Labs  08/28/13 1130  08/30/13 0405 08/30/13 1500 08/31/13 0345  WBC  --   < > 24.3* 24.6* 19.7*  HGB  --   < > 6.5* 7.9* 6.4*  HCT  --   < > 18.1* 21.9* 17.8*  PLT  --   < > 110* 123* 124*  INR 1.37  --   --   --   --   < > = values in this interval not displayed.   Recent Labs  08/29/13 0320 08/30/13 0405 08/31/13 0345  NA 149* 145 145  K 3.4* 3.0* 3.2*  CL 111 106 106  CO2 22 25 25   GLUCOSE 225* 197* 162*  BUN 91* 86* 87*  CREATININE 2.58* 2.54* 2.50*  CALCIUM 7.1* 7.5* 7.3*  MG 1.5 1.9  --   PHOS 3.8 4.4 4.8*  PROT 4.2*  --   --   ALBUMIN 1.3* 1.3* 1.2*  AST 20  --   --   ALT 17  --   --   ALKPHOS 79  --   --   BILITOT 1.5*  --   --    Estimated Creatinine Clearance: 29.3 ml/min (by C-G formula based on Cr of 2.5).    Recent Labs  08/31/13 0023 08/31/13 0347 08/31/13 0751  GLUCAP 113* 165* 125*    Medications:  Scheduled:  . antiseptic oral rinse  15 mL Mouth Rinse QID  . chlorhexidine  15 mL Mouth Rinse BID  . darbepoetin (ARANESP) injection - NON-DIALYSIS  100 mcg Subcutaneous Q Mon-1800  . feeding supplement (PRO-STAT SUGAR FREE 64)  30 mL Per Tube TID  . furosemide  80 mg Intravenous Q12H  . insulin aspart   0-15 Units Subcutaneous 6 times per day  . metoprolol  5 mg Intravenous Q4H  . pantoprazole (PROTONIX) IV  40 mg Intravenous Q12H  . potassium chloride  10 mEq Intravenous Q1 Hr x 4  . sulfamethoxazole-trimethoprim  400 mg of trimethoprim Intravenous Q12H    Insulin Requirements in the past 24 hours:  5 units mod SSI/12h  & 17 units insulin in TPN   Current Nutrition:  -Clinimix E 5/15 at 60 ml/hr + lipids at 10 ml/hr- 72 gm of protein and 1584 kcals  -Vital 1.5 at 58mL/hr + Prostat 60mL TID- provides 61g protein and 660kcal  (Goal rate is 94mL/hr with Prostat liquid 88mL PT TID. This will provide 134g protein and 2280 kcal)   Nutritional Goals:  2100-2300 kCal, 120-135 grams of protein per day per RD recommendations 2/9   Admit: 73 year old male with SBO and ischemic bowel s/p resection at [facility]. Was transferred to [facility] for continuing hemodialysis and inability to wean from ventilator. developed a new bowel perforation at his anastomosis site and is s/p ex lap (2/1) with small bowel resection and wound vac placement. Now  with melena in ostomy- surgery considering angio. Surgery may be needed though he is high risk   GI: new bowel perforation s/p ex lap; was on TPN at [facility]. To OR 2/3 for small bowel anastomosis, colostomy, gastrostomy tube. (+)tagged RBC study, hence mesenteric angiogram 2/13 & no bleeding source found. PPI-IV   Nutrition: TF started 2/9- pt had emesis when TF turned up to 81mL/hr on 2/9- rate was turned down to 61mL/hr & has continued at this rate with Pro-Stat TID- tolerating with minimal residuals. Unable to advance as pt with frank melena which has required transfusion   Endo:  CBGs 113-165, only 1 cbg > 150 with increased insulin in TPN  Lytes:  K 3.2- K-runs x 4 ordered this AM & lytes in TPN, Na 145, Phos 4.8- creeping up, Mag 1.9, CorCa ~9.54   Renal:  renal function recovering- hasn't had HD since 2/2, but only tolerated 1 hour at that time d/t tachycardia and  decreased BP. Creat 2.5- gradually continuing to improve; UOP 2 ml/kg/hr.   Hepatobil: LFTs WNL except albumin remains low at 1.3 and Tbili now elevated at 1.5; trig 146; prealbumin improved to 13.2-still below normal limits   Heme: Hemoptysis and melanotic stools. Hg 6.2- down again with no source of bleeding found on angiogram 2/13. PRBC x 2 this AM.   Aranesp 148mcg SQ qMon   ID: Abx at [facility]: Flagyl 1/21>>2/1, Meropenem 1/24>>2/1  Zosyn 2/1>> (CCM noted stated 7 days total, today is day #11)  Vanc 2/9>>2/10  Linezolid 2/10>> 2/12  Bactrim IV 2/12 >>  2/9 trach aspirate: Stenotrophomonas maltophilia, s: SMX/TMP  WBC 19.7, AFeb  TPN Access: CVC   Plan:  -  TF to continue at 69mL/hr today + Prostat 47mL TID (61g protein + 660kcal)  -   Continue Clinimix E 5/15 + full multivitamin and trace elements- will continue at same rate of 60 ml/hr + 20% lipids at 10 ml/hr. This provides 72 gm of protein and 1584 kcals. Combined with TF, providing 100% of nutritional needs  -   Increase Insulin to 20 units per bag. Continue SSI q4h.  -   Watch Phos closely and remove electrolytes from TPN if continue to rise.  Pt continues to require K-runs and would receive greater benefit from lytes in TPN -   KCl 31mEq IV runs x4 per Renal for repletion of electrolytes -   TPN labs as ordered- BMET, Mg, Phos in AM -   Follow for ability to increase TF rate after GI bleeding resolved and wean off TPN  Gracy Bruins, [facility]

## 2013-09-01 ENCOUNTER — Inpatient Hospital Stay (HOSPITAL_COMMUNITY): Payer: Medicare Other

## 2013-09-01 LAB — COMPREHENSIVE METABOLIC PANEL
ALT: 29 U/L (ref 0–53)
AST: 34 U/L (ref 0–37)
Albumin: 1.5 g/dL — ABNORMAL LOW (ref 3.5–5.2)
Alkaline Phosphatase: 262 U/L — ABNORMAL HIGH (ref 39–117)
BILIRUBIN TOTAL: 1.5 mg/dL — AB (ref 0.3–1.2)
BUN: 89 mg/dL — ABNORMAL HIGH (ref 6–23)
CHLORIDE: 105 meq/L (ref 96–112)
CO2: 25 meq/L (ref 19–32)
CREATININE: 2.53 mg/dL — AB (ref 0.50–1.35)
Calcium: 7.6 mg/dL — ABNORMAL LOW (ref 8.4–10.5)
GFR, EST AFRICAN AMERICAN: 28 mL/min — AB (ref >90)
GFR, EST NON AFRICAN AMERICAN: 24 mL/min — AB (ref >90)
GLUCOSE: 132 mg/dL — AB (ref 70–99)
Potassium: 3.4 mEq/L — ABNORMAL LOW (ref 3.7–5.3)
Sodium: 146 mEq/L (ref 137–147)
Total Protein: 5.4 g/dL — ABNORMAL LOW (ref 6.0–8.3)

## 2013-09-01 LAB — GLUCOSE, CAPILLARY
GLUCOSE-CAPILLARY: 140 mg/dL — AB (ref 70–99)
GLUCOSE-CAPILLARY: 108 mg/dL — AB (ref 70–99)
Glucose-Capillary: 106 mg/dL — ABNORMAL HIGH (ref 70–99)
Glucose-Capillary: 110 mg/dL — ABNORMAL HIGH (ref 70–99)
Glucose-Capillary: 122 mg/dL — ABNORMAL HIGH (ref 70–99)
Glucose-Capillary: 134 mg/dL — ABNORMAL HIGH (ref 70–99)
Glucose-Capillary: 140 mg/dL — ABNORMAL HIGH (ref 70–99)

## 2013-09-01 LAB — PHOSPHORUS: PHOSPHORUS: 5.1 mg/dL — AB (ref 2.3–4.6)

## 2013-09-01 LAB — TYPE AND SCREEN
ABO/RH(D): A POS
Antibody Screen: NEGATIVE
UNIT DIVISION: 0
UNIT DIVISION: 0
Unit division: 0

## 2013-09-01 LAB — CBC
HCT: 25.2 % — ABNORMAL LOW (ref 39.0–52.0)
HEMOGLOBIN: 9.1 g/dL — AB (ref 13.0–17.0)
MCH: 31.7 pg (ref 26.0–34.0)
MCHC: 36.1 g/dL — ABNORMAL HIGH (ref 30.0–36.0)
MCV: 87.8 fL (ref 78.0–100.0)
PLATELETS: 131 10*3/uL — AB (ref 150–400)
RBC: 2.87 MIL/uL — AB (ref 4.22–5.81)
RDW: 15.6 % — ABNORMAL HIGH (ref 11.5–15.5)
WBC: 18.5 10*3/uL — AB (ref 4.0–10.5)

## 2013-09-01 MED ORDER — TRACE MINERALS CR-CU-F-FE-I-MN-MO-SE-ZN IV SOLN
INTRAVENOUS | Status: DC
  Administered 2013-09-01: 18:00:00 via INTRAVENOUS
  Filled 2013-09-01: qty 2000

## 2013-09-01 MED ORDER — SODIUM CHLORIDE 0.9 % IJ SOLN
10.0000 mL | Freq: Two times a day (BID) | INTRAMUSCULAR | Status: DC
  Administered 2013-09-01: 30 mL
  Administered 2013-09-01 – 2013-09-02 (×2): 10 mL

## 2013-09-01 MED ORDER — FAT EMULSION 20 % IV EMUL
240.0000 mL | INTRAVENOUS | Status: DC
  Administered 2013-09-01: 240 mL via INTRAVENOUS
  Filled 2013-09-01: qty 250

## 2013-09-01 MED ORDER — POTASSIUM CHLORIDE 10 MEQ/50ML IV SOLN
10.0000 meq | INTRAVENOUS | Status: AC
  Administered 2013-09-01 (×4): 10 meq via INTRAVENOUS
  Filled 2013-09-01 (×4): qty 50

## 2013-09-01 MED ORDER — SODIUM CHLORIDE 0.9 % IJ SOLN: 10.0000 mL | INTRAMUSCULAR | Status: DC | PRN

## 2013-09-01 NOTE — Progress Notes (Signed)
Admit: 08/18/2013 LOS: 78  15M with dialysis dependent prolonged AKI- but no HD since 2/2, with chronic ventilatory failure, small bowel perforation (repaired 08/20/13), abdominal septic shock.    Subjective:  hgb stable overnight !  Remains with good UOP, creatinine stable    02/14 0701 - 02/15 0700 In: 4062.5 [I.V.:450; Blood:162.5; NG/GT:320; IV Piggyback:1300; TPN:1680] Out: 4098 [Urine:4965; Stool:600]  Filed Weights   08/30/13 0500 08/31/13 0500 09/01/13 0500  Weight: 81.3 kg (179 lb 3.7 oz) 80.5 kg (177 lb 7.5 oz) 77.4 kg (170 lb 10.2 oz)    Current meds: reviewed Current Labs: reviewed   Physical Exam:  Blood pressure 155/89, pulse 99, temperature 99 F (37.2 C), temperature source Oral, resp. rate 34, height 5\' 11"  (1.803 m), weight 77.4 kg (170 lb 10.2 oz), SpO2 96.00%. GEN: chronically ill appearing, Trach, more awake and alert but no commands- struggling to breath even on vent NT: Trach - HD cath out EYES: EOMI. Eyes open to noise, stimullus  CV: tachy, regular  PULM: +Bs b/l  ABD: wound vac at midline  SKIN: large ventral abd incision  EXT: 2+ pitting edema b/l  Assessment/Plan 1. Prolonged Dialysis Dependent AKI: Appears at this time to be liberated from RRT with sufficient but low GFR and adequate UOP.  No HD since 2/2 (which was abandoned early 2/2 hemodynamin instability).  Creatinine seeming to trend down slowly?  Have added lasix even though is making plenty of urine- is getting nearly daily transfusions and is more in than out.  No indications for HD at present 2. Anemia: transfusions per CCM and surgery.  Have added ESA- now also ?bleeding- off blood thinners- nuc scan positive for bleeding- arteriogram did not find anything to embolize- unfortunate- maybe bleeding is slowing ?? 3. Sptic shock and SB performation: per surgery and CCM. - wbc trending down 4.  Mild metabolic acidosis: improved 5. Dispo- has only been a month since he was in "good health" according  to wife.  Feel like she would do whatever it takes at this time.  Fortunately, dialysis not needed for now.  6. Hypokalemia- gentle repletion - getting better- putting in tpn as well.  7. Hypertension- maybe due to positive fluid balance mostly due to blood- have increased lopressor recently - is better  Jason Harmon A   09/01/2013, 8:49 AM   Recent Labs Lab 08/29/13 0320 08/30/13 0405 08/31/13 0345 09/01/13 0500  NA 149* 145 145 146  K 3.4* 3.0* 3.2* 3.4*  CL 111 106 106 105  CO2 22 25 25 25   GLUCOSE 225* 197* 162* 132*  BUN 91* 86* 87* 89*  CREATININE 2.58* 2.54* 2.50* 2.53*  CALCIUM 7.1* 7.5* 7.3* 7.6*  PHOS 3.8 4.4 4.8*  --     Recent Labs Lab 08/31/13 0345 08/31/13 1510 09/01/13 0500  WBC 19.7* 20.3* 18.5*  HGB 6.4* 9.3* 9.1*  HCT 17.8* 26.3* 25.2*  MCV 87.7 89.5 87.8  PLT 124* 124* 131*

## 2013-09-01 NOTE — Progress Notes (Signed)
I have seen and examined the pt and agree with PA-Riebock's progress note. Hct stable Con't wound care TFs

## 2013-09-01 NOTE — Progress Notes (Signed)
12 Days Post-Op  Subjective: Stable overnight.    Objective: Vital signs in last 24 hours: Temp:  [98.1 F (36.7 C)-99.8 F (37.7 C)] 99 F (37.2 C) (02/15 0738) Pulse Rate:  [97-116] 99 (02/15 0731) Resp:  [20-34] 34 (02/15 0731) BP: (110-155)/(63-97) 155/89 mmHg (02/15 0731) SpO2:  [94 %-100 %] 96 % (02/15 0731) FiO2 (%):  [40 %] 40 % (02/15 0800) Weight:  [77.4 kg (170 lb 10.2 oz)] 77.4 kg (170 lb 10.2 oz) (02/15 0500) Last BM Date: 08/31/13  Intake/Output from previous day: 02/14 0701 - 02/15 0700 In: 4062.5 [I.V.:450; Blood:162.5; NG/GT:320; IV Piggyback:1300; TPN:1680] Out: [id number] [Urine:4965; Stool:600] Intake/Output this shift: Total I/O In: 130 [I.V.:20; Other:30; NG/GT:10; TPN:70] Out: 225 [Urine:225]   Physical Exam:  General: Pt awake, mild distress  Abdomen: Soft. Nondistended. Midline wound is clean. Ostomy with melanotic stool.       Lab Results:   Recent Labs  08/31/13 1510 09/01/13 0500  WBC 20.3* 18.5*  HGB 9.3* 9.1*  HCT 26.3* 25.2*  PLT 124* 131*   BMET  Recent Labs  08/31/13 0345 09/01/13 0500  NA 145 146  K 3.2* 3.4*  CL 106 105  CO2 25 25  GLUCOSE 162* 132*  BUN 87* 89*  CREATININE 2.50* 2.53*  CALCIUM 7.3* 7.6*   PT/INR No results found for this basename: LABPROT, INR,  in the last 72 hours ABG No results found for this basename: PHART, PCO2, PO2, HCO3,  in the last 72 hours  Studies/Results: Nm Gi Blood Loss  08/30/2013   CLINICAL DATA:  Dark blood in the patient's ostomy bag, likely related to breakdown of an enteroenteric anastomosis in the small bowel.  EXAM: NUCLEAR MEDICINE GASTROINTESTINAL BLEEDING SCAN  TECHNIQUE: Sequential abdominal images were obtained following intravenous administration of Tc-66m labeled red blood cells.  COMPARISON:  CT abdomen and pelvis 08/17/2013. No prior nuclear imaging.  RADIOPHARMACEUTICALS:  25 mCi ULTRATAG TECHNETIUM TC 76M-LABELED RED BLOOD CELLS IV  FINDINGS: Abnormal activity to the  right of midline in the mid abdomen, at or near the level of the pelvic brim, which progresses in a pattern that is consistent with a small bowel source. Based on the anatomy on the prior CT, this likely corresponds to the enteroenteric anastomosis. The small bowel in this region on the prior CT was abnormal with marked wall thickening consistent with severe inflammation.  IMPRESSION: Active GI bleeding from a loop of small bowel to the right of midline at or near the level of the pelvic brim, likely corresponding to the enteroenteric anastomosis based on the anatomy on the recent CT scan.  These results were called by telephone at the time of interpretation on 08/30/2013 at 3:10 PM to Dr. Georganna Skeans , who verbally acknowledged these results.   Electronically Signed   By: Evangeline Dakin M.D.   On: 08/30/2013 15:15   Ir Angiogram Visceral [facility]  08/31/2013   CLINICAL DATA:  GI bleed and anemia. Status post small bowel resection. Positive bleeding scan earlier today localizing to the right lower quadrant.  EXAM: [facility] VISCERAL ARTERIOGRAPHY; IR ULTRASOUND GUIDANCE VASC ACCESS RIGHT  1. ULTRASOUND GUIDANCE FOR VASCULAR ACCESS OF THE RIGHT COMMON FEMORAL ARTERY 2. SELECTIVE VISCERAL ARTERIOGRAPHY OF THE CELIAC AXIS 3. SELECTIVE VISCERAL ARTERIOGRAPHY OF THE SUPERIOR MESENTERIC ARTERY 4. SELECTIVE VISCERAL ARTERIOGRAPHY OF THE INFERIOR MESENTERIC ARTERY  MEDICATIONS: Sedation:  1.0 MG IV VERSED AND, 50 MCG IV FENTANYL.  Total Moderate Sedation Time:  43 minutes.  100 mcg intra-arterial nitroglycerin  given during the procedure in the superior mesenteric artery.  CONTRAST:  3mL OMNIPAQUE IOHEXOL 300 MG/ML  SOLN  FLUOROSCOPY TIME:  11 min and 6 seconds.  PROCEDURE: Prior to the procedure, informed consent was obtained from the patient's wife for visceral arteriography with possible embolization.  The right groin was prepped with Betadine and sterilely draped. Ultrasound was used to confirm patency of the  right common femoral artery. Under direct ultrasound guidance, a axis of the artery was performed with a micropuncture set.  A 5 French sheath was placed over a guidewire. A 5 [place] cobra catheter was advanced into the abdominal aorta. The celiac axis was selectively catheterized. After first order catheterization, selective arteriography was performed.  The superior mesenteric artery was then selectively catheterized. The catheter was advanced into the proximal trunk of the superior mesenteric artery over a guidewire. Selective arteriography was performed in multiple projections including post nitroglycerin administration runs.  The inferior mesenteric artery was catheterized with a 5 [place] Sos catheter. Selective arteriography was performed of the entire IMA distribution. After the procedure, the catheter was removed. Oblique arteriography was performed at the femoral puncture site. Arteriotomy closure was performed with the Cordis EXOSEAL device.  Complications: None  FINDINGS: Selective celiac arteriography demonstrates normal vascular anatomy with normally patent splenic artery, left gastric artery, hepatic arteries and gastroduodenal artery identified. There is no evidence of contrast extravasation or vascular abnormalities.  Superior mesenteric arteriography demonstrates no evidence of active bleeding or AV malformation. Normal perfusion of small bowel is noted with normal visualized venous phase.  Inferior mesenteric arteriography demonstrates normal branches supplying the distal colon. No evidence of bleeding site or vascular abnormality.  IMPRESSION: Normal selective 3 vessel visceral arteriography of the celiac axis, SMA and IMA. No active bleeding or abnormal vessels were identified to allow embolization.   Electronically Signed   By: Aletta Edouard M.D.   On: 08/31/2013 15:24   Ir Angiogram Visceral [facility]  08/31/2013   CLINICAL DATA:  GI bleed and anemia. Status post small bowel resection.  Positive bleeding scan earlier today localizing to the right lower quadrant.  EXAM: SELECTIVE VISCERAL ARTERIOGRAPHY; IR ULTRASOUND GUIDANCE VASC ACCESS RIGHT  1. ULTRASOUND GUIDANCE FOR VASCULAR ACCESS OF THE RIGHT COMMON FEMORAL ARTERY 2. SELECTIVE VISCERAL ARTERIOGRAPHY OF THE CELIAC AXIS 3. SELECTIVE VISCERAL ARTERIOGRAPHY OF THE SUPERIOR MESENTERIC ARTERY 4. SELECTIVE VISCERAL ARTERIOGRAPHY OF THE INFERIOR MESENTERIC ARTERY  MEDICATIONS: Sedation:  1.0 MG IV VERSED AND, 50 MCG IV FENTANYL.  Total Moderate Sedation Time:  43 minutes.  100 mcg intra-arterial nitroglycerin given during the procedure in the superior mesenteric artery.  CONTRAST:  60mL OMNIPAQUE IOHEXOL 300 MG/ML  SOLN  FLUOROSCOPY TIME:  11 min and 6 seconds.  PROCEDURE: Prior to the procedure, informed consent was obtained from the patient's wife for visceral arteriography with possible embolization.  The right groin was prepped with Betadine and sterilely draped. Ultrasound was used to confirm patency of the right common femoral artery. Under direct ultrasound guidance, a axis of the artery was performed with a micropuncture set.  A 5 French sheath was placed over a guidewire. A 5 [place] cobra catheter was advanced into the abdominal aorta. The celiac axis was selectively catheterized. After first order catheterization, selective arteriography was performed.  The superior mesenteric artery was then selectively catheterized. The catheter was advanced into the proximal trunk of the superior mesenteric artery over a guidewire. Selective arteriography was performed in multiple projections including post nitroglycerin administration runs.  The inferior mesenteric  artery was catheterized with a 5 [place] Sos catheter. Selective arteriography was performed of the entire IMA distribution. After the procedure, the catheter was removed. Oblique arteriography was performed at the femoral puncture site. Arteriotomy closure was performed with the Cordis  EXOSEAL device.  Complications: None  FINDINGS: Selective celiac arteriography demonstrates normal vascular anatomy with normally patent splenic artery, left gastric artery, hepatic arteries and gastroduodenal artery identified. There is no evidence of contrast extravasation or vascular abnormalities.  Superior mesenteric arteriography demonstrates no evidence of active bleeding or AV malformation. Normal perfusion of small bowel is noted with normal visualized venous phase.  Inferior mesenteric arteriography demonstrates normal branches supplying the distal colon. No evidence of bleeding site or vascular abnormality.  IMPRESSION: Normal selective 3 vessel visceral arteriography of the celiac axis, SMA and IMA. No active bleeding or abnormal vessels were identified to allow embolization.   Electronically Signed   By: Irish Lack M.D.   On: 08/31/2013 15:24   Ir Angiogram Visceral [facility]  08/31/2013   CLINICAL DATA:  GI bleed and anemia. Status post small bowel resection. Positive bleeding scan earlier today localizing to the right lower quadrant.  EXAM: SELECTIVE VISCERAL ARTERIOGRAPHY; IR ULTRASOUND GUIDANCE VASC ACCESS RIGHT  1. ULTRASOUND GUIDANCE FOR VASCULAR ACCESS OF THE RIGHT COMMON FEMORAL ARTERY 2. SELECTIVE VISCERAL ARTERIOGRAPHY OF THE CELIAC AXIS 3. SELECTIVE VISCERAL ARTERIOGRAPHY OF THE SUPERIOR MESENTERIC ARTERY 4. SELECTIVE VISCERAL ARTERIOGRAPHY OF THE INFERIOR MESENTERIC ARTERY  MEDICATIONS: Sedation:  1.0 MG IV VERSED AND, 50 MCG IV FENTANYL.  Total Moderate Sedation Time:  43 minutes.  100 mcg intra-arterial nitroglycerin given during the procedure in the superior mesenteric artery.  CONTRAST:  77mL OMNIPAQUE IOHEXOL 300 MG/ML  SOLN  FLUOROSCOPY TIME:  11 min and 6 seconds.  PROCEDURE: Prior to the procedure, informed consent was obtained from the patient's wife for visceral arteriography with possible embolization.  The right groin was prepped with Betadine and sterilely draped.  Ultrasound was used to confirm patency of the right common femoral artery. Under direct ultrasound guidance, a axis of the artery was performed with a micropuncture set.  A 5 French sheath was placed over a guidewire. A 5 [place] cobra catheter was advanced into the abdominal aorta. The celiac axis was selectively catheterized. After first order catheterization, selective arteriography was performed.  The superior mesenteric artery was then selectively catheterized. The catheter was advanced into the proximal trunk of the superior mesenteric artery over a guidewire. Selective arteriography was performed in multiple projections including post nitroglycerin administration runs.  The inferior mesenteric artery was catheterized with a 5 [place] Sos catheter. Selective arteriography was performed of the entire IMA distribution. After the procedure, the catheter was removed. Oblique arteriography was performed at the femoral puncture site. Arteriotomy closure was performed with the Cordis EXOSEAL device.  Complications: None  FINDINGS: Selective celiac arteriography demonstrates normal vascular anatomy with normally patent splenic artery, left gastric artery, hepatic arteries and gastroduodenal artery identified. There is no evidence of contrast extravasation or vascular abnormalities.  Superior mesenteric arteriography demonstrates no evidence of active bleeding or AV malformation. Normal perfusion of small bowel is noted with normal visualized venous phase.  Inferior mesenteric arteriography demonstrates normal branches supplying the distal colon. No evidence of bleeding site or vascular abnormality.  IMPRESSION: Normal selective 3 vessel visceral arteriography of the celiac axis, SMA and IMA. No active bleeding or abnormal vessels were identified to allow embolization.   Electronically Signed   By: Rudene Anda.D.  On: 08/31/2013 15:24   Ir US Guide Vasc Access Right  08/31/2013   CLINICAL DATA:  GI bleed and  anemia. Status post small bowel resection. Positive bleeding scan earlier today localizing to the right lower quadrant.  EXAM: SELECTIVE VISCERAL ARTERIOGRAPHY; IR ULTRASOUND GUIDANCE VASC ACCESS RIGHT  1. ULTRASOUND GUIDANCE FOR VASCULAR ACCESS OF THE RIGHT COMMON FEMORAL ARTERY 2. SELECTIVE VISCERAL ARTERIOGRAPHY OF THE CELIAC AXIS 3. SELECTIVE VISCERAL ARTERIOGRAPHY OF THE SUPERIOR MESENTERIC ARTERY 4. SELECTIVE VISCERAL ARTERIOGRAPHY OF THE INFERIOR MESENTERIC ARTERY  MEDICATIONS: Sedation:  1.0 MG IV VERSED AND, 50 MCG IV FENTANYL.  Total Moderate Sedation Time:  43 minutes.  100 mcg intra-arterial nitroglycerin given during the procedure in the superior mesenteric artery.  CONTRAST:  82mL OMNIPAQUE IOHEXOL 300 MG/ML  SOLN  FLUOROSCOPY TIME:  11 min and 6 seconds.  PROCEDURE: Prior to the procedure, informed consent was obtained from the patient's wife for visceral arteriography with possible embolization.  The right groin was prepped with Betadine and sterilely draped. Ultrasound was used to confirm patency of the right common femoral artery. Under direct ultrasound guidance, a axis of the artery was performed with a micropuncture set.  A 5 French sheath was placed over a guidewire. A 5 [place] cobra catheter was advanced into the abdominal aorta. The celiac axis was selectively catheterized. After first order catheterization, selective arteriography was performed.  The superior mesenteric artery was then selectively catheterized. The catheter was advanced into the proximal trunk of the superior mesenteric artery over a guidewire. Selective arteriography was performed in multiple projections including post nitroglycerin administration runs.  The inferior mesenteric artery was catheterized with a 5 [place] Sos catheter. Selective arteriography was performed of the entire IMA distribution. After the procedure, the catheter was removed. Oblique arteriography was performed at the femoral puncture site. Arteriotomy  closure was performed with the Cordis EXOSEAL device.  Complications: None  FINDINGS: Selective celiac arteriography demonstrates normal vascular anatomy with normally patent splenic artery, left gastric artery, hepatic arteries and gastroduodenal artery identified. There is no evidence of contrast extravasation or vascular abnormalities.  Superior mesenteric arteriography demonstrates no evidence of active bleeding or AV malformation. Normal perfusion of small bowel is noted with normal visualized venous phase.  Inferior mesenteric arteriography demonstrates normal branches supplying the distal colon. No evidence of bleeding site or vascular abnormality.  IMPRESSION: Normal selective 3 vessel visceral arteriography of the celiac axis, SMA and IMA. No active bleeding or abnormal vessels were identified to allow embolization.   Electronically Signed   By: Aletta Edouard M.D.   On: 08/31/2013 15:24   Dg Chest Port 1 View  09/01/2013   CLINICAL DATA:  Tracheostomy tube  EXAM: PORTABLE CHEST - 1 VIEW  COMPARISON:  DG CHEST 1V PORT dated 08/31/2013; DG CHEST 1V PORT dated 08/29/2013; DG CHEST 1V PORT dated 08/28/2013; CT CHEST W/O CM dated 08/29/2013  FINDINGS: Grossly unchanged cardiac silhouette and mediastinal contours given decreased lung volumes and patient rotation. Stable position of support apparatus. Bilateral perihilar predominant heterogeneous airspace opacities are unchanged minimally worsened in the interval. Trace bilateral effusions are suspected with minimal amount of fluid layering along the peripheral aspect of the left upper lung. No definite pneumothorax. Unchanged bones.  IMPRESSION: No change to slight worsening of perihilar predominant airspace opacities, likely alveolar pulmonary edema, though note, infection, aspiration and pulmonary hemorrhage remain differential considerations.   Electronically Signed   By: Sandi Mariscal M.D.   On: 09/01/2013 08:17   Dg Chest Port 1  View  08/31/2013   CLINICAL  DATA:  Pneumonia and CHF.  EXAM: PORTABLE CHEST - 1 VIEW  COMPARISON:  DG CHEST 1V PORT dated 08/29/2013; CT CHEST W/O CM dated 08/29/2013; DG CHEST 1V PORT dated 08/28/2013; DG CHEST 1V PORT dated 08/26/2013  FINDINGS: Tracheostomy shows stable positioning. Lung shows significant increase in airspace disease bilaterally, especially on the right. Findings likely reflect pulmonary edema but may also be due to pneumonia/aspiration. No pneumothorax or significant pleural effusions are identified. Central line positioning is stable. The heart size remains normal.  IMPRESSION: Significant worsening of bilateral pulmonary airspace disease, right greater than left.   Electronically Signed   By: Aletta Edouard M.D.   On: 08/31/2013 08:26    Anti-infectives: Anti-infectives   Start     Dose/Rate Route Frequency Ordered Stop   08/29/13 1200  sulfamethoxazole-trimethoprim (BACTRIM) 400 mg of trimethoprim in dextrose 5 % 500 mL IVPB     400 mg of trimethoprim 350 mL/hr over 90 Minutes Intravenous Every 12 hours 08/29/13 1149     08/28/13 1900  vancomycin (VANCOCIN) 1,250 mg in sodium chloride 0.9 % 250 mL IVPB  Status:  Discontinued     1,250 mg 166.7 mL/hr over 90 Minutes Intravenous Every 48 hours 08/26/13 1758 08/27/13 0900   08/27/13 0930  linezolid (ZYVOX) IVPB 600 mg  Status:  Discontinued     600 mg 300 mL/hr over 60 Minutes Intravenous Every 12 hours 08/27/13 0900 08/29/13 1057   08/26/13 1900  vancomycin (VANCOCIN) 1,750 mg in sodium chloride 0.9 % 500 mL IVPB     1,750 mg 250 mL/hr over 120 Minutes Intravenous  Once 08/26/13 1758 08/26/13 2044   08/18/13 1400  piperacillin-tazobactam (ZOSYN) IVPB 2.25 g  Status:  Discontinued     2.25 g 100 mL/hr over 30 Minutes Intravenous 3 times per day 08/18/13 1124 08/29/13 1057   08/18/13 0500  piperacillin-tazobactam (ZOSYN) IVPB 3.375 g  Status:  Discontinued     3.375 g 12.5 mL/hr over 240 Minutes Intravenous 3 times per day 08/18/13 0434 08/18/13 1124        Assessment/Plan: 1.Pod 14/12 reexploration with small bowel anastmosis, colostomy and g tube  2. VDRF, intermittent trach collar vs vent  3. Anemia, ABL secondary to melena from GI tract and trach  4. PCM/TNA/TF   Angio 2/13 without active bleeding.  hgb down to 6.4 2/14 and received 1 unit of blood.   hemologic and hematocrit are stable today.  We do not feel he would survive another surgery should he bleed again.  Will continue to follow.   LOS: 14 days    Darlis Wragg ANP-BC 09/01/2013 9:25 AM

## 2013-09-01 NOTE — Progress Notes (Addendum)
PARENTERAL NUTRITION CONSULT NOTE - FOLLOW UP  Pharmacy Consult for TPN Indication: Perforated viscous  No Known Allergies  Patient Measurements: Height: 5\' 11"  (180.3 cm) (5'11") Weight: 170 lb 10.2 oz (77.4 kg) IBW/kg (Calculated) : 75.3 Adjusted Body Weight:  Usual Weight:   Vital Signs: Temp: 99 F (37.2 C) (02/15 0738) Temp src: Oral (02/15 0738) BP: 132/78 mmHg (02/15 0900) Pulse Rate: 105 (02/15 0900) Intake/Output from previous day: 02/14 0701 - 02/15 0700 In: 4062.5 [I.V.:450; Blood:162.5; NG/GT:320; IV Piggyback:1300; TPN:1680] Out: 5565 [Urine:4965; Stool:600] Intake/Output from this shift: Total I/O In: 330 [I.V.:60; Other:30; NG/GT:30; TPN:210] Out: 775 [Urine:775]  Labs:  Recent Labs  08/31/13 0345 08/31/13 1510 09/01/13 0500  WBC 19.7* 20.3* 18.5*  HGB 6.4* 9.3* 9.1*  HCT 17.8* 26.3* 25.2*  PLT 124* 124* 131*     Recent Labs  08/30/13 0405 08/31/13 0345 09/01/13 0500  NA 145 145 146  K 3.0* 3.2* 3.4*  CL 106 106 105  CO2 25 25 25   GLUCOSE 197* 162* 132*  BUN 86* 87* 89*  CREATININE 2.54* 2.50* 2.53*  CALCIUM 7.5* 7.3* 7.6*  MG 1.9  --   --   PHOS 4.4 4.8*  --   PROT  --   --  5.4*  ALBUMIN 1.3* 1.2* 1.5*  AST  --   --  34  ALT  --   --  29  ALKPHOS  --   --  262*  BILITOT  --   --  1.5*   Estimated Creatinine Clearance: 28.9 ml/min (by C-G formula based on Cr of 2.53).    Recent Labs  09/01/13 0021 09/01/13 0407 09/01/13 0739  GLUCAP 134* 140* 106*    Medications:  Scheduled:  . antiseptic oral rinse  15 mL Mouth Rinse QID  . chlorhexidine  15 mL Mouth Rinse BID  . darbepoetin (ARANESP) injection - NON-DIALYSIS  100 mcg Subcutaneous Q Mon-1800  . feeding supplement (PRO-STAT SUGAR FREE 64)  30 mL Per Tube TID  . furosemide  80 mg Intravenous Q12H  . insulin aspart  0-15 Units Subcutaneous 6 times per day  . metoprolol  5 mg Intravenous Q4H  . pantoprazole (PROTONIX) IV  40 mg Intravenous Q12H  . sodium chloride   10-40 mL Intracatheter Q12H  . sulfamethoxazole-trimethoprim  400 mg of trimethoprim Intravenous Q12H    Insulin Requirements in the past 24 hours:  4 units mod SSI/12h & 20 units insulin in TPN   Current Nutrition:  -Clinimix E 5/15 at 60 ml/hr + lipids at 10 ml/hr- 72 gm of protein and 1584 kcals  -Vital 1.5 at 80mL/hr + Prostat 94mL TID- provides 61g protein and 660kcal  (Goal rate is 36mL/hr with Prostat liquid 71mL PT TID. This will provide 134g protein and 2280 kcal)   Nutritional Goals:  2100-2300 kCal, 120-135 grams of protein per day per RD recommendations 2/9   Admit: 73 year old male with SBO and ischemic bowel s/p resection at [facility]. Was transferred to [facility] for continuing hemodialysis and inability to wean from ventilator. developed a new bowel perforation at his anastomosis site and is s/p ex lap (2/1) with small bowel resection and wound vac placement. Now with melena in ostomy- surgery considering angio. Surgery may be needed though he is high risk   GI: new bowel perforation s/p ex lap; was on TPN at [facility]. To OR 2/3 for small bowel anastomosis, colostomy, gastrostomy tube. (+)tagged RBC study, hence mesenteric angiogram 2/13 & no bleeding source found.  PPI-IV   Nutrition: TF started 2/9- pt had emesis when TF turned up to 23mL/hr on 2/9- rate was turned down to 68mL/hr & has continued at this rate with Pro-Stat TID- tolerating with minimal residuals. Unable to advance as pt with frank melena which has required transfusion   Endo: CBGs 106-140,  with increased insulin in TPN   Lytes: K 3.4- no K runs by renal this AM, Na 146, CorCa ~9.6. Phos 5.1- up again   Renal:  Appears renal function recovering- hasn't had HD since 2/2, & only tolerated 1 hour at that time d/t tachycardia and decreased BP. Creat 2.53- gradually continuing to improve; UOP 2.6 ml/kg/hr.  Requiring lasix 2nd fluid overload, primarily d/t transfusions.  Hepatobil: LFTs WNL except albumin remains low at  1.3 and Tbili now elevated at 1.5; trig 146; prealbumin improved to 13.2-still below normal limits   Heme: Hemoptysis and melanotic stools. Hg 9.1- improved from 6.4 s/p transfusion & stable overnight.  No source of bleeding found on angiogram; multiple transfusions. Aranesp 127mcg SQ qMon   ID: Abx at [facility]: Flagyl 1/21>>2/1, Meropenem 1/24>>2/1  Zosyn 2/1>> (CCM noted stated 7 days total, today is day #11)  Vanc 2/9>>2/10  Linezolid 2/10>> 2/12  Bactrim IV 2/12 >>  2/9 trach aspirate: Stenotrophomonas maltophilia, s: SMX/TMP  WBC 18.5, AFeb   TPN Access: CVC   Plan:  - TF to continue at 27mL/hr today + Prostat 85mL TID (61g protein + 660kcal)  - Change to Clinimix  5/15 (no lytes) at same rate of 60 ml/hr + 20% lipids at 10 ml/hr. This provides 72 gm of protein and 1584 kcals. Combined with TF, providing 100% of nutritional needs  - May need to alternate with lytes and without lytes - Continue Insulin to 20 units per bag. Continue SSI q4h.  - TPN labs as ordered- BMET, Mg, Phos in AM  - Follow for ability to increase TF rate after GI bleeding resolved and wean off TPN   Gracy Bruins, PharmD [facility]

## 2013-09-01 NOTE — Progress Notes (Signed)
PULMONARY  / CRITICAL CARE MEDICINE  Name: Jason Harmon MRN: [id number] DOB: November 11, 1940    ADMISSION DATE:  08/18/2013 LOS 14 days   CHIEF COMPLAINT:  Bowel perforation  BRIEF PATIENT DESCRIPTION:  73 yo male with SBO/ischemic bowel s/p resection with subsequent complicated medical course including respiratory failure and inability to wean from ventilator,admitted from [facility] with bowel perforation at anastomosis site.  SIGNIFICANT EVENTS: 1/22 Tracheostomy 2/01 Transfer from [facility], to ER, to [facility] ICU.  CCS consulted 2/1 Exploratory laparotomy with segmental resection of small bowel anastomosis and sigmoid colon with placement of abdominal wound vac 2/02 Off pressors, did not tolerate HD 2/4 small bowel anastomosis with gastrostomy & End sigmoid colostomy 2/8 ATC x 24h 2/9 Hemoptysis, anemia >> PRBC transfusion; CXR with new infiltrate 2/10 Red rash from vancomycin, increased WOB 2/12 2 units PRBC transfusion 2/13 Transfuse PRBC 2/13 GI Nuclear scan -: Active GI bleeding from a loop of small bowel to the right of midline at or near the level of the pelvic brim, likely corresponding to the enteroenteric anastomosis   2/13 IR angiogram-no evidence of active bleed or AVM .  08/31/13: Still has sputum with blood, but less.  Continues to have melena and needing PRBC's.Unable to tolerate trach collar d/t increased wob     STUDIES:  CT A/P with contrast 1/31 >>  bowel perforation, likely anastomotic breakdown at proximal enteroenterostomy with pneumoperitoneum and spilling of oral contrast, moderate ascites with peritonitis CT chest 2/12 >> b/l GGO, Lt > Rt pleural effusions  LINES / TUBES: Tracheostomy 1/22>>> R IJ HD catheter 1/12>>>  CULTURES: Sputum 2/9 >> Moderate Stenotrophomonas maltophilia  ANTIBIOTICS: Zosyn 2/1 >> 2/12 Vancomycin 2/9 >> 2/10 Linezolid 2/10 >> 2/12 Bactrim 2/12 >>   SUBJECTIVE:  09/01/13:  Hemoptysis per RN has improved. Anastomosis blood looks dark  and improved per RN. Per RN CCS has told famil he is poor surgical candidate. HE has weaned all day today. Only on prn sedation   PHYSICAL EXAM VITAL SIGNS: Temp:  [98.1 F (36.7 C)-99.8 F (37.7 C)] 99.3 F (37.4 C) (02/15 1134) Pulse Rate:  [96-114] 104 (02/15 1136) Resp:  [20-34] 32 (02/15 1136) BP: (110-155)/(63-97) 119/71 mmHg (02/15 1136) SpO2:  [91 %-100 %] 96 % (02/15 1136) FiO2 (%):  [40 %] 40 % (02/15 1200) Weight:  [77.4 kg (170 lb 10.2 oz)] 77.4 kg (170 lb 10.2 oz) (02/15 0500) VENT SETTINGS: Vent Mode:  [-] CPAP;PSV FiO2 (%):  [40 %] 40 % Set Rate:  [24 bmp] 24 bmp Vt Set:  [600 mL] 600 mL PEEP:  [8 cmH20] 8 cmH20 Pressure Support:  [10 cmH20] 10 cmH20 Plateau Pressure:  [26 cmH20-30 cmH20] 28 cmH20 INTAKE / OUTPUT: Intake/Output     02/14 0701 - 02/15 0700 02/15 0701 - 02/16 0700   I.V. (mL/kg) 450 (5.8) 100 (1.3)   Blood 162.5    Other 150 30   NG/GT 320 50   IV Piggyback 1300    TPN 1680 350   Total Intake(mL/kg) 4062.5 (52.5) 530 (6.8)   Urine (mL/kg/hr) 4965 (2.7) 1325 (2.6)   Stool 600 (0.3)    Total Output 5565 1325   Net -1502.5 -795          PHYSICAL EXAMINATION: General: ill appearing, lying in bed on vent. Neuro: somnolent, f/c intermittently  HEENT:  Trach site with mild bloody secretions Cardiovascular:  Tachycardic, regular.  No M/R/G. Lungs: scant  rhonchi, no wheeze Abdomen:  Dressings in place.  Melanotic appearing drainage  in ostomy bag.  BS+  Musculoskeletal:  1-2+ edema, non-pitting. Skin:  Mid abd wound /dsg intact dry /clean , no rash  LABS: PULMONARY  Recent Labs Lab 08/27/13 0916  PHART 7.358  PCO2ART 25.6*  PO2ART 68.0*  HCO3 14.4*  TCO2 15  O2SAT 93.0    CBC  Recent Labs Lab 08/31/13 0345 08/31/13 1510 09/01/13 0500  HGB 6.4* 9.3* 9.1*  HCT 17.8* 26.3* 25.2*  WBC 19.7* 20.3* 18.5*  PLT 124* 124* 131*    COAGULATION  Recent Labs Lab 08/26/13 1215 08/28/13 1130  INR 1.40 1.37    CARDIAC  No  results found for this basename: TROPONINI,  in the last 168 hours No results found for this basename: PROBNP,  in the last 168 hours   CHEMISTRY  Recent Labs Lab 08/26/13 0347 08/27/13 0424  08/28/13 1450 08/29/13 0320 08/30/13 0405 08/31/13 0345 09/01/13 0500  NA 144 149*  < > 145 149* 145 145 146  K 3.2* 3.2*  < > 3.3* 3.4* 3.0* 3.2* 3.4*  CL 114* 116*  < > 109 111 106 106 105  CO2 12* 15*  < > 21 22 25 25 25   GLUCOSE 73 135*  < > 140* 225* 197* 162* 132*  BUN 107* 103*  < > 95* 91* 86* 87* 89*  CREATININE 3.08* 2.87*  < > 2.68* 2.58* 2.54* 2.50* 2.53*  CALCIUM 7.4* 7.5*  < > 7.3* 7.1* 7.5* 7.3* 7.6*  MG 1.5 1.6  --   --  1.5 1.9  --   --   PHOS 3.5  --   < > 3.8 3.8 4.4 4.8* 5.1*  < > = values in this interval not displayed. Estimated Creatinine Clearance: 28.9 ml/min (by C-G formula based on Cr of 2.53).   LIVER  Recent Labs Lab 08/26/13 0347 08/26/13 1215  08/28/13 1130 08/28/13 1450 08/29/13 0320 08/30/13 0405 08/31/13 0345 09/01/13 0500  AST 21  --   --   --   --  20  --   --  34  ALT 20  --   --   --   --  17  --   --  29  ALKPHOS 101  --   --   --   --  79  --   --  262*  BILITOT 1.0  --   --   --   --  1.5*  --   --  1.5*  PROT 4.8*  --   --   --   --  4.2*  --   --  5.4*  ALBUMIN 1.4*  --   < >  --  1.5* 1.3* 1.3* 1.2* 1.5*  INR  --  1.40  --  1.37  --   --   --   --   --   < > = values in this interval not displayed.   INFECTIOUS  Recent Labs Lab 08/27/13 0958 08/28/13 0413 08/29/13 0320  LATICACIDVEN 1.2  --   --   PROCALCITON 0.55 0.44 0.69     ENDOCRINE CBG (last 3)   Recent Labs  09/01/13 0407 09/01/13 0739 09/01/13 1135  GLUCAP 140* 106* 122*         IMAGING x48h  Nm Gi Blood Loss  08/30/2013   CLINICAL DATA:  Dark blood in the patient's ostomy bag, likely related to breakdown of an enteroenteric anastomosis in the small bowel.  EXAM: NUCLEAR MEDICINE GASTROINTESTINAL BLEEDING SCAN  TECHNIQUE: Sequential abdominal  images were obtained following intravenous administration of Tc-76m labeled red blood cells.  COMPARISON:  CT abdomen and pelvis 08/17/2013. No prior nuclear imaging.  RADIOPHARMACEUTICALS:  25 mCi ULTRATAG TECHNETIUM TC 20M-LABELED RED BLOOD CELLS IV  FINDINGS: Abnormal activity to the right of midline in the mid abdomen, at or near the level of the pelvic brim, which progresses in a pattern that is consistent with a small bowel source. Based on the anatomy on the prior CT, this likely corresponds to the enteroenteric anastomosis. The small bowel in this region on the prior CT was abnormal with marked wall thickening consistent with severe inflammation.  IMPRESSION: Active GI bleeding from a loop of small bowel to the right of midline at or near the level of the pelvic brim, likely corresponding to the enteroenteric anastomosis based on the anatomy on the recent CT scan.  These results were called by telephone at the time of interpretation on 08/30/2013 at 3:10 PM to Dr. Georganna Skeans , who verbally acknowledged these results.   Electronically Signed   By: Evangeline Dakin M.D.   On: 08/30/2013 15:15   Ir Angiogram Visceral Selective  08/31/2013   CLINICAL DATA:  GI bleed and anemia. Status post small bowel resection. Positive bleeding scan earlier today localizing to the right lower quadrant.  EXAM: SELECTIVE VISCERAL ARTERIOGRAPHY; IR ULTRASOUND GUIDANCE VASC ACCESS RIGHT  1. ULTRASOUND GUIDANCE FOR VASCULAR ACCESS OF THE RIGHT COMMON FEMORAL ARTERY 2. SELECTIVE VISCERAL ARTERIOGRAPHY OF THE CELIAC AXIS 3. SELECTIVE VISCERAL ARTERIOGRAPHY OF THE SUPERIOR MESENTERIC ARTERY 4. SELECTIVE VISCERAL ARTERIOGRAPHY OF THE INFERIOR MESENTERIC ARTERY  MEDICATIONS: Sedation:  1.0 MG IV VERSED AND, 50 MCG IV FENTANYL.  Total Moderate Sedation Time:  43 minutes.  100 mcg intra-arterial nitroglycerin given during the procedure in the superior mesenteric artery.  CONTRAST:  44mL OMNIPAQUE IOHEXOL 300 MG/ML  SOLN  FLUOROSCOPY  TIME:  11 min and 6 seconds.  PROCEDURE: Prior to the procedure, informed consent was obtained from the patient's wife for visceral arteriography with possible embolization.  The right groin was prepped with Betadine and sterilely draped. Ultrasound was used to confirm patency of the right common femoral artery. Under direct ultrasound guidance, a axis of the artery was performed with a micropuncture set.  A 5 French sheath was placed over a guidewire. A 5 [place] cobra catheter was advanced into the abdominal aorta. The celiac axis was selectively catheterized. After first order catheterization, selective arteriography was performed.  The superior mesenteric artery was then selectively catheterized. The catheter was advanced into the proximal trunk of the superior mesenteric artery over a guidewire. Selective arteriography was performed in multiple projections including post nitroglycerin administration runs.  The inferior mesenteric artery was catheterized with a 5 [place] Sos catheter. Selective arteriography was performed of the entire IMA distribution. After the procedure, the catheter was removed. Oblique arteriography was performed at the femoral puncture site. Arteriotomy closure was performed with the Cordis EXOSEAL device.  Complications: None  FINDINGS: Selective celiac arteriography demonstrates normal vascular anatomy with normally patent splenic artery, left gastric artery, hepatic arteries and gastroduodenal artery identified. There is no evidence of contrast extravasation or vascular abnormalities.  Superior mesenteric arteriography demonstrates no evidence of active bleeding or AV malformation. Normal perfusion of small bowel is noted with normal visualized venous phase.  Inferior mesenteric arteriography demonstrates normal branches supplying the distal colon. No evidence of bleeding site or vascular abnormality.  IMPRESSION: Normal selective 3 vessel visceral arteriography of the celiac axis, SMA  and  IMA. No active bleeding or abnormal vessels were identified to allow embolization.   Electronically Signed   By: Aletta Edouard M.D.   On: 08/31/2013 15:24   Ir Angiogram Visceral Selective  08/31/2013   CLINICAL DATA:  GI bleed and anemia. Status post small bowel resection. Positive bleeding scan earlier today localizing to the right lower quadrant.  EXAM: SELECTIVE VISCERAL ARTERIOGRAPHY; IR ULTRASOUND GUIDANCE VASC ACCESS RIGHT  1. ULTRASOUND GUIDANCE FOR VASCULAR ACCESS OF THE RIGHT COMMON FEMORAL ARTERY 2. SELECTIVE VISCERAL ARTERIOGRAPHY OF THE CELIAC AXIS 3. SELECTIVE VISCERAL ARTERIOGRAPHY OF THE SUPERIOR MESENTERIC ARTERY 4. SELECTIVE VISCERAL ARTERIOGRAPHY OF THE INFERIOR MESENTERIC ARTERY  MEDICATIONS: Sedation:  1.0 MG IV VERSED AND, 50 MCG IV FENTANYL.  Total Moderate Sedation Time:  43 minutes.  100 mcg intra-arterial nitroglycerin given during the procedure in the superior mesenteric artery.  CONTRAST:  84mL OMNIPAQUE IOHEXOL 300 MG/ML  SOLN  FLUOROSCOPY TIME:  11 min and 6 seconds.  PROCEDURE: Prior to the procedure, informed consent was obtained from the patient's wife for visceral arteriography with possible embolization.  The right groin was prepped with Betadine and sterilely draped. Ultrasound was used to confirm patency of the right common femoral artery. Under direct ultrasound guidance, a axis of the artery was performed with a micropuncture set.  A 5 French sheath was placed over a guidewire. A 5 [place] cobra catheter was advanced into the abdominal aorta. The celiac axis was selectively catheterized. After first order catheterization, selective arteriography was performed.  The superior mesenteric artery was then selectively catheterized. The catheter was advanced into the proximal trunk of the superior mesenteric artery over a guidewire. Selective arteriography was performed in multiple projections including post nitroglycerin administration runs.  The inferior mesenteric artery  was catheterized with a 5 [place] Sos catheter. Selective arteriography was performed of the entire IMA distribution. After the procedure, the catheter was removed. Oblique arteriography was performed at the femoral puncture site. Arteriotomy closure was performed with the Cordis EXOSEAL device.  Complications: None  FINDINGS: Selective celiac arteriography demonstrates normal vascular anatomy with normally patent splenic artery, left gastric artery, hepatic arteries and gastroduodenal artery identified. There is no evidence of contrast extravasation or vascular abnormalities.  Superior mesenteric arteriography demonstrates no evidence of active bleeding or AV malformation. Normal perfusion of small bowel is noted with normal visualized venous phase.  Inferior mesenteric arteriography demonstrates normal branches supplying the distal colon. No evidence of bleeding site or vascular abnormality.  IMPRESSION: Normal selective 3 vessel visceral arteriography of the celiac axis, SMA and IMA. No active bleeding or abnormal vessels were identified to allow embolization.   Electronically Signed   By: Aletta Edouard M.D.   On: 08/31/2013 15:24   Ir Angiogram Visceral Selective  08/31/2013   CLINICAL DATA:  GI bleed and anemia. Status post small bowel resection. Positive bleeding scan earlier today localizing to the right lower quadrant.  EXAM: SELECTIVE VISCERAL ARTERIOGRAPHY; IR ULTRASOUND GUIDANCE VASC ACCESS RIGHT  1. ULTRASOUND GUIDANCE FOR VASCULAR ACCESS OF THE RIGHT COMMON FEMORAL ARTERY 2. SELECTIVE VISCERAL ARTERIOGRAPHY OF THE CELIAC AXIS 3. SELECTIVE VISCERAL ARTERIOGRAPHY OF THE SUPERIOR MESENTERIC ARTERY 4. SELECTIVE VISCERAL ARTERIOGRAPHY OF THE INFERIOR MESENTERIC ARTERY  MEDICATIONS: Sedation:  1.0 MG IV VERSED AND, 50 MCG IV FENTANYL.  Total Moderate Sedation Time:  43 minutes.  100 mcg intra-arterial nitroglycerin given during the procedure in the superior mesenteric artery.  CONTRAST:  9mL OMNIPAQUE  IOHEXOL 300 MG/ML  SOLN  FLUOROSCOPY TIME:  11 min  and 6 seconds.  PROCEDURE: Prior to the procedure, informed consent was obtained from the patient's wife for visceral arteriography with possible embolization.  The right groin was prepped with Betadine and sterilely draped. Ultrasound was used to confirm patency of the right common femoral artery. Under direct ultrasound guidance, a axis of the artery was performed with a micropuncture set.  A 5 French sheath was placed over a guidewire. A 5 [place] cobra catheter was advanced into the abdominal aorta. The celiac axis was selectively catheterized. After first order catheterization, selective arteriography was performed.  The superior mesenteric artery was then selectively catheterized. The catheter was advanced into the proximal trunk of the superior mesenteric artery over a guidewire. Selective arteriography was performed in multiple projections including post nitroglycerin administration runs.  The inferior mesenteric artery was catheterized with a 5 [place] Sos catheter. Selective arteriography was performed of the entire IMA distribution. After the procedure, the catheter was removed. Oblique arteriography was performed at the femoral puncture site. Arteriotomy closure was performed with the Cordis EXOSEAL device.  Complications: None  FINDINGS: Selective celiac arteriography demonstrates normal vascular anatomy with normally patent splenic artery, left gastric artery, hepatic arteries and gastroduodenal artery identified. There is no evidence of contrast extravasation or vascular abnormalities.  Superior mesenteric arteriography demonstrates no evidence of active bleeding or AV malformation. Normal perfusion of small bowel is noted with normal visualized venous phase.  Inferior mesenteric arteriography demonstrates normal branches supplying the distal colon. No evidence of bleeding site or vascular abnormality.  IMPRESSION: Normal selective 3 vessel visceral  arteriography of the celiac axis, SMA and IMA. No active bleeding or abnormal vessels were identified to allow embolization.   Electronically Signed   By: Aletta Edouard M.D.   On: 08/31/2013 15:24   Ir US Guide Vasc Access Right  08/31/2013   CLINICAL DATA:  GI bleed and anemia. Status post small bowel resection. Positive bleeding scan earlier today localizing to the right lower quadrant.  EXAM: SELECTIVE VISCERAL ARTERIOGRAPHY; IR ULTRASOUND GUIDANCE VASC ACCESS RIGHT  1. ULTRASOUND GUIDANCE FOR VASCULAR ACCESS OF THE RIGHT COMMON FEMORAL ARTERY 2. SELECTIVE VISCERAL ARTERIOGRAPHY OF THE CELIAC AXIS 3. SELECTIVE VISCERAL ARTERIOGRAPHY OF THE SUPERIOR MESENTERIC ARTERY 4. SELECTIVE VISCERAL ARTERIOGRAPHY OF THE INFERIOR MESENTERIC ARTERY  MEDICATIONS: Sedation:  1.0 MG IV VERSED AND, 50 MCG IV FENTANYL.  Total Moderate Sedation Time:  43 minutes.  100 mcg intra-arterial nitroglycerin given during the procedure in the superior mesenteric artery.  CONTRAST:  18mL OMNIPAQUE IOHEXOL 300 MG/ML  SOLN  FLUOROSCOPY TIME:  11 min and 6 seconds.  PROCEDURE: Prior to the procedure, informed consent was obtained from the patient's wife for visceral arteriography with possible embolization.  The right groin was prepped with Betadine and sterilely draped. Ultrasound was used to confirm patency of the right common femoral artery. Under direct ultrasound guidance, a axis of the artery was performed with a micropuncture set.  A 5 French sheath was placed over a guidewire. A 5 [place] cobra catheter was advanced into the abdominal aorta. The celiac axis was selectively catheterized. After first order catheterization, selective arteriography was performed.  The superior mesenteric artery was then selectively catheterized. The catheter was advanced into the proximal trunk of the superior mesenteric artery over a guidewire. Selective arteriography was performed in multiple projections including post nitroglycerin administration  runs.  The inferior mesenteric artery was catheterized with a 5 [place] Sos catheter. Selective arteriography was performed of the entire IMA distribution. After the procedure, the catheter  was removed. Oblique arteriography was performed at the femoral puncture site. Arteriotomy closure was performed with the Cordis EXOSEAL device.  Complications: None  FINDINGS: Selective celiac arteriography demonstrates normal vascular anatomy with normally patent splenic artery, left gastric artery, hepatic arteries and gastroduodenal artery identified. There is no evidence of contrast extravasation or vascular abnormalities.  Superior mesenteric arteriography demonstrates no evidence of active bleeding or AV malformation. Normal perfusion of small bowel is noted with normal visualized venous phase.  Inferior mesenteric arteriography demonstrates normal branches supplying the distal colon. No evidence of bleeding site or vascular abnormality.  IMPRESSION: Normal selective 3 vessel visceral arteriography of the celiac axis, SMA and IMA. No active bleeding or abnormal vessels were identified to allow embolization.   Electronically Signed   By: Irish Lack M.D.   On: 08/31/2013 15:24   Dg Chest Port 1 View  09/01/2013   CLINICAL DATA:  Tracheostomy tube  EXAM: PORTABLE CHEST - 1 VIEW  COMPARISON:  DG CHEST 1V PORT dated 08/31/2013; DG CHEST 1V PORT dated 08/29/2013; DG CHEST 1V PORT dated 08/28/2013; CT CHEST W/O CM dated 08/29/2013  FINDINGS: Grossly unchanged cardiac silhouette and mediastinal contours given decreased lung volumes and patient rotation. Stable position of support apparatus. Bilateral perihilar predominant heterogeneous airspace opacities are unchanged minimally worsened in the interval. Trace bilateral effusions are suspected with minimal amount of fluid layering along the peripheral aspect of the left upper lung. No definite pneumothorax. Unchanged bones.  IMPRESSION: No change to slight worsening of perihilar  predominant airspace opacities, likely alveolar pulmonary edema, though note, infection, aspiration and pulmonary hemorrhage remain differential considerations.   Electronically Signed   By: Simonne Come M.D.   On: 09/01/2013 08:17   Dg Chest Port 1 View  08/31/2013   CLINICAL DATA:  Pneumonia and CHF.  EXAM: PORTABLE CHEST - 1 VIEW  COMPARISON:  DG CHEST 1V PORT dated 08/29/2013; CT CHEST W/O CM dated 08/29/2013; DG CHEST 1V PORT dated 08/28/2013; DG CHEST 1V PORT dated 08/26/2013  FINDINGS: Tracheostomy shows stable positioning. Lung shows significant increase in airspace disease bilaterally, especially on the right. Findings likely reflect pulmonary edema but may also be due to pneumonia/aspiration. No pneumothorax or significant pleural effusions are identified. Central line positioning is stable. The heart size remains normal.  IMPRESSION: Significant worsening of bilateral pulmonary airspace disease, right greater than left.   Electronically Signed   By: Irish Lack M.D.   On: 08/31/2013 08:26     ASSESSMENT / PLAN:  PULMONARY A: Acute respiratory failure in setting of perforated bowel.  S/p Tracheostomy 1/22. Hemoptysis develop 2//09 2nd to Stenotrophomonas HCAP >> slightly improved 2/14 and 2/15  09/01/13: Does some wean with PSV on trach during day.  P: -continue vent support-wean as tolerated -trach collar wean as tolerated  -limit trach suctioning -prn BD's -f/u CXR -cont abx   CARDIOVASCULAR A: Septic shock from perforated bowel >> resolved off pressors 2/02. Hx of A fib >> previously on amiodarone. P: -continue scheduled IV lopressor   RENAL A: Acute kidney injury 2nd to ischemic bowel, shock >> started on HD January 2015 >> good UOP, scr tr down . Anion gap metabolic acidosis >> resolved.   09/01/13: Creat slowly improving. Mild hypokalemia P: -negative fluid balance as tolerated >> lasix ordered by renal -f/u and replace electrolytes as needed - replete  kcl  GASTROINTESTINAL A: Perforated bowel. Protein calorie malnutrition. Melena develop 2/11 >> ?from anastamosis. GI px   09/01/13: No overt gi bleed  P: -post-op care per CCS -advance diet per CCS >> started tube feeds 2/09, tolerating ok. -protonix for SUP -for nuclear scan 2/13 and ? Embolization if bleeding source localized -very high risk if he needs any additional abd surgeries  HEMATOLOGIC A: Anemia of critical illness and acute blood loss from GI source. >GI nuclear scan +bleed>IR angiogram>no source found  09/01/13: hgb holding  P: -SCD for DVT prevention -f/u H/H after transfusion -transfuse for Hb < 7 even for slow bleeds (use higher hgb trigger if bp/hr instability with bleeding) -f/u CBC in AM   INFECTIOUS A: Septic shock from bowel perforation >> improved. HCAP with Stenotrophomonas in sputum developed 2/09. Developed rash from vancomycin. P: -changed Abx to bactrim 2/12 -monitor WBC/fever curve  ENDOCRINE A: Hyperglycemia. P: -SSI  NEUROLOGIC A: Acute encephalopathy 2nd to sepsis >> improving. Deconditioning. P: -will need PT/OT at some point >> likely will need LTAC as he continues to improve.   GLOBAL 09/01/13: family at bedside updated    The patient is critically ill with multiple organ systems failure and requires high complexity decision making for assessment and support, frequent evaluation and titration of therapies, application of advanced monitoring technologies and extensive interpretation of multiple databases.   Critical Care Time devoted to patient care services described in this note is  35  Minutes.  Dr. Brand Males, M.D., [facility].C.P Pulmonary and Critical Care Medicine Staff Physician [facility] Pager: [phone], If no answer or between  15:00h - 7:00h: call [phone]  09/01/2013 1:43 PM

## 2013-09-02 LAB — COMPREHENSIVE METABOLIC PANEL
ALT: 35 U/L (ref 0–53)
AST: 36 U/L (ref 0–37)
Albumin: 1.5 g/dL — ABNORMAL LOW (ref 3.5–5.2)
Alkaline Phosphatase: 244 U/L — ABNORMAL HIGH (ref 39–117)
BUN: 96 mg/dL — AB (ref 6–23)
CO2: 25 mEq/L (ref 19–32)
Calcium: 7.3 mg/dL — ABNORMAL LOW (ref 8.4–10.5)
Chloride: 100 mEq/L (ref 96–112)
Creatinine, Ser: 2.73 mg/dL — ABNORMAL HIGH (ref 0.50–1.35)
GFR calc non Af Amer: 22 mL/min — ABNORMAL LOW (ref >90)
GFR, EST AFRICAN AMERICAN: 26 mL/min — AB (ref >90)
GLUCOSE: 128 mg/dL — AB (ref 70–99)
Potassium: 3.1 mEq/L — ABNORMAL LOW (ref 3.7–5.3)
SODIUM: 140 meq/L (ref 137–147)
TOTAL PROTEIN: 5.1 g/dL — AB (ref 6.0–8.3)
Total Bilirubin: 1.2 mg/dL (ref 0.3–1.2)

## 2013-09-02 LAB — DIFFERENTIAL
BASOS ABS: 0 10*3/uL (ref 0.0–0.1)
Basophils Relative: 0 % (ref 0–1)
Eosinophils Absolute: 0.1 10*3/uL (ref 0.0–0.7)
Eosinophils Relative: 1 % (ref 0–5)
LYMPHS PCT: 4 % — AB (ref 12–46)
Lymphs Abs: 0.5 10*3/uL — ABNORMAL LOW (ref 0.7–4.0)
MONOS PCT: 8 % (ref 3–12)
Monocytes Absolute: 1 10*3/uL (ref 0.1–1.0)
Neutro Abs: 10.6 10*3/uL — ABNORMAL HIGH (ref 1.7–7.7)
Neutrophils Relative %: 87 % — ABNORMAL HIGH (ref 43–77)

## 2013-09-02 LAB — GLUCOSE, CAPILLARY
Glucose-Capillary: 153 mg/dL — ABNORMAL HIGH (ref 70–99)
Glucose-Capillary: 112 mg/dL — ABNORMAL HIGH (ref 70–99)
Glucose-Capillary: 134 mg/dL — ABNORMAL HIGH (ref 70–99)
Glucose-Capillary: 155 mg/dL — ABNORMAL HIGH (ref 70–99)
Glucose-Capillary: 110 mg/dL — ABNORMAL HIGH (ref 70–99)

## 2013-09-02 LAB — CBC
HCT: 22.4 % — ABNORMAL LOW (ref 39.0–52.0)
Hemoglobin: 7.9 g/dL — ABNORMAL LOW (ref 13.0–17.0)
MCH: 31.7 pg (ref 26.0–34.0)
MCHC: 35.3 g/dL (ref 30.0–36.0)
MCV: 90 fL (ref 78.0–100.0)
PLATELETS: 120 10*3/uL — AB (ref 150–400)
RBC: 2.49 MIL/uL — ABNORMAL LOW (ref 4.22–5.81)
RDW: 15.7 % — AB (ref 11.5–15.5)
WBC: 12.2 10*3/uL — ABNORMAL HIGH (ref 4.0–10.5)

## 2013-09-02 LAB — TRIGLYCERIDES: TRIGLYCERIDES: 123 mg/dL (ref <150)

## 2013-09-02 LAB — PREALBUMIN: PREALBUMIN: 12.5 mg/dL — AB (ref 17.0–34.0)

## 2013-09-02 LAB — PHOSPHORUS: Phosphorus: 4.3 mg/dL (ref 2.3–4.6)

## 2013-09-02 LAB — MAGNESIUM: MAGNESIUM: 1.8 mg/dL (ref 1.5–2.5)

## 2013-09-02 MED ORDER — TRACE MINERALS CR-CU-F-FE-I-MN-MO-SE-ZN IV SOLN
INTRAVENOUS | Status: AC
  Administered 2013-09-02: 19:00:00 via INTRAVENOUS
  Filled 2013-09-02: qty 2000

## 2013-09-02 MED ORDER — PANTOPRAZOLE SODIUM 40 MG IV SOLR
40.0000 mg | INTRAVENOUS | Status: DC
  Administered 2013-09-03 – 2013-09-04 (×2): 40 mg via INTRAVENOUS
  Filled 2013-09-02 (×2): qty 40

## 2013-09-02 MED ORDER — MIDAZOLAM HCL 2 MG/2ML IJ SOLN: 1.0000 mg | INTRAMUSCULAR | Status: DC | PRN

## 2013-09-02 MED ORDER — VITAL 1.5 CAL PO LIQD
1000.0000 mL | ORAL | Status: DC
  Administered 2013-09-02: 1000 mL
  Filled 2013-09-02: qty 1000

## 2013-09-02 MED ORDER — FAT EMULSION 20 % IV EMUL
240.0000 mL | INTRAVENOUS | Status: AC
  Administered 2013-09-02: 240 mL via INTRAVENOUS
  Filled 2013-09-02: qty 250

## 2013-09-02 MED ORDER — ALBUTEROL SULFATE (2.5 MG/3ML) 0.083% IN NEBU: 2.5000 mg | INHALATION_SOLUTION | RESPIRATORY_TRACT | Status: DC | PRN

## 2013-09-02 MED ORDER — FENTANYL CITRATE 0.05 MG/ML IJ SOLN: 25.0000 ug | INTRAMUSCULAR | Status: DC | PRN

## 2013-09-02 MED ORDER — POTASSIUM CHLORIDE 10 MEQ/50ML IV SOLN: 10.0000 meq | INTRAVENOUS | Status: DC

## 2013-09-02 MED ORDER — POTASSIUM CHLORIDE 20 MEQ/15ML (10%) PO LIQD
40.0000 meq | Freq: Once | ORAL | Status: AC
  Administered 2013-09-02: 40 meq
  Filled 2013-09-02: qty 30

## 2013-09-02 NOTE — Progress Notes (Signed)
Patient ID: Jason Harmon, male   DOB: January 31, 1941, 73 y.o.   MRN: [id number]  Subjective: No transfusion since Saturday.  H&h down but stable.  Objective:  Vital signs:  Filed Vitals:   09/02/13 0500 09/02/13 0600 09/02/13 0700 09/02/13 0728  BP: 130/80 131/81 122/68   Pulse: 98 99 91   Temp:    99.7 F (37.6 C)  TempSrc:    Axillary  Resp: _0 Height:      Weight: 77 kg (169 lb 12.1 oz)     SpO2: 97% 97% 99%     Last BM Date: 08/31/13  Intake/Output   Yesterday:  02/15 0701 - 02/16 0700 In: 4970 [I.V.:510; NG/GT:330; IV Piggyback:725; TPN:1680] Out: 2637 [Urine:4125; Stool:625] This shift:    I/O last 3 completed shifts: In: 5200 [I.V.:720; Other:180; NG/GT:530; IV Piggyback:1250] Out: 7835 [Urine:6760; CHYIF:0277]    Physical Exam:  General: lethargic  Abdomen: Soft. Nondistended. Midline wound is clean. Ostomy with liquid yellow stool, does not appear melanotic.    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0 pg   MCHC 35.4  30.0 - 36.0 g/dL   RDW 15.0  11.5 - 15.5 %   Platelets 124 (*) 150 - 400 K/uL  GLUCOSE, CAPILLARY     Status: Abnormal   Collection Time    08/31/13  3:40 PM      Result Value  Ref Range   Glucose-Capillary 189 (*) 70 - 99 mg/dL  GLUCOSE, CAPILLARY     Status: Abnormal   Collection Time    08/31/13  8:10 PM      Result Value Ref Range   Glucose-Capillary 112 (*) 70 - 99 mg/dL   Comment 1 Documented in Chart     Comment 2 Notify RN    GLUCOSE, CAPILLARY     Status: Abnormal   Collection Time    09/01/13 12:21 AM      Result Value Ref Range   Glucose-Capillary 134 (*) 70 - 99 mg/dL   Comment 1 Documented in Chart     Comment 2 Notify RN    GLUCOSE, CAPILLARY     Status: Abnormal   Collection Time    09/01/13  4:07 AM      Result Value Ref Range   Glucose-Capillary 140 (*) 70 - 99 mg/dL  CBC     Status: Abnormal   Collection Time    09/01/13  5:00 AM  Result Value Ref Range   WBC 18.5 (*) 4.0 - 10.5 K/uL   RBC 2.87 (*) 4.22 - 5.81 MIL/uL   Hemoglobin 9.1 (*) 13.0 - 17.0 g/dL   HCT 25.2 (*) 39.0 - 52.0 %   MCV 87.8  78.0 - 100.0 fL   MCH 31.7  26.0 - 34.0 pg   MCHC 36.1 (*) 30.0 - 36.0 g/dL   RDW 15.6 (*) 11.5 - 15.5 %   Platelets 131 (*) 150 - 400 K/uL  COMPREHENSIVE METABOLIC PANEL     Status: Abnormal   Collection Time    09/01/13  5:00 AM      Result Value Ref Range   Sodium 146  137 - 147 mEq/L   Potassium 3.4 (*) 3.7 - 5.3 mEq/L   Chloride 105  96 - 112 mEq/L   CO2 25  19 - 32 mEq/L   Glucose, Bld 132 (*) 70 - 99 mg/dL   BUN 89 (*) 6 - 23 mg/dL   Creatinine, Ser 2.53 (*) 0.50 - 1.35 mg/dL   Calcium 7.6 (*) 8.4 - 10.5 mg/dL   Total Protein 5.4 (*) 6.0 - 8.3 g/dL   Albumin 1.5 (*) 3.5 - 5.2 g/dL   AST 34  0 - 37 U/L   ALT 29  0 - 53 U/L   Alkaline Phosphatase 262 (*) 39 - 117 U/L   Total Bilirubin 1.5 (*) 0.3 - 1.2 mg/dL   GFR calc non Af Amer 24 (*) >90 mL/min   GFR calc Af Amer 28 (*) >90 mL/min   Comment: (NOTE)     The eGFR has been calculated using the CKD EPI equation.     This calculation has not been validated in all clinical situations.     eGFR's persistently <90 mL/min signify possible Chronic Kidney     Disease.   PHOSPHORUS     Status: Abnormal   Collection Time    09/01/13  5:00 AM      Result Value Ref Range   Phosphorus 5.1 (*) 2.3 - 4.6 mg/dL  GLUCOSE, CAPILLARY     Status: Abnormal   Collection Time    09/01/13  7:39 AM      Result Value Ref Range   Glucose-Capillary 106 (*) 70 - 99 mg/dL  GLUCOSE, CAPILLARY     Status: Abnormal   Collection Time    09/01/13 11:35 AM      Result Value Ref Range   Glucose-Capillary 122 (*) 70 - 99 mg/dL  GLUCOSE, CAPILLARY     Status: Abnormal   Collection Time    09/01/13  4:26 PM      Result Value Ref Range   Glucose-Capillary 140 (*) 70 - 99 mg/dL  GLUCOSE, CAPILLARY     Status: Abnormal   Collection Time    09/01/13  7:36 PM      Result Value Ref Range   Glucose-Capillary 108 (*) 70 - 99 mg/dL  GLUCOSE, CAPILLARY     Status: Abnormal   Collection Time    09/01/13 11:44 PM      Result Value Ref Range   Glucose-Capillary 110 (*) 70 - 99 mg/dL  GLUCOSE, CAPILLARY     Status: Abnormal   Collection Time    09/02/13  3:45 AM      Result Value Ref Range   Glucose-Capillary 153 (*) 70 - 99 mg/dL  COMPREHENSIVE METABOLIC PANEL     Status: Abnormal   Collection Time    09/02/13  5:15  AM      Result Value Ref Range   Sodium 140  137 - 147 mEq/L   Potassium 3.1 (*) 3.7 - 5.3 mEq/L   Chloride 100  96 - 112 mEq/L   CO2 25  19 - 32 mEq/L   Glucose, Bld 128 (*) 70 - 99 mg/dL   BUN 96 (*) 6 - 23 mg/dL   Creatinine, Ser 2.73 (*) 0.50 - 1.35 mg/dL   Calcium 7.3 (*) 8.4 - 10.5 mg/dL   Total Protein 5.1 (*) 6.0 - 8.3 g/dL   Albumin 1.5 (*) 3.5 - 5.2 g/dL   AST 36  0 - 37 U/L   ALT 35  0 - 53 U/L   Alkaline Phosphatase 244 (*) 39 - 117 U/L   Total Bilirubin 1.2  0.3 - 1.2 mg/dL   GFR calc non Af Amer 22 (*) >90 mL/min   GFR calc Af Amer 26 (*) >90 mL/min   Comment: (NOTE)     The eGFR has been calculated using the CKD EPI equation.     This calculation has not been validated in all clinical situations.     eGFR's persistently <90 mL/min signify  possible Chronic Kidney     Disease.  MAGNESIUM     Status: None   Collection Time    09/02/13  5:15 AM      Result Value Ref Range   Magnesium 1.8  1.5 - 2.5 mg/dL  PHOSPHORUS     Status: None   Collection Time    09/02/13  5:15 AM      Result Value Ref Range   Phosphorus 4.3  2.3 - 4.6 mg/dL  CBC     Status: Abnormal   Collection Time    09/02/13  5:15 AM      Result Value Ref Range   WBC 12.2 (*) 4.0 - 10.5 K/uL   RBC 2.49 (*) 4.22 - 5.81 MIL/uL   Hemoglobin 7.9 (*) 13.0 - 17.0 g/dL   HCT 22.4 (*) 39.0 - 52.0 %   MCV 90.0  78.0 - 100.0 fL   MCH 31.7  26.0 - 34.0 pg   MCHC 35.3  30.0 - 36.0 g/dL   RDW 15.7 (*) 11.5 - 15.5 %   Platelets 120 (*) 150 - 400 K/uL  DIFFERENTIAL     Status: Abnormal   Collection Time    09/02/13  5:15 AM      Result Value Ref Range   Neutrophils Relative % 87 (*) 43 - 77 %   Lymphocytes Relative 4 (*) 12 - 46 %   Monocytes Relative 8  3 - 12 %   Eosinophils Relative 1  0 - 5 %   Basophils Relative 0  0 - 1 %   Neutro Abs 10.6 (*) 1.7 - 7.7 K/uL   Lymphs Abs 0.5 (*) 0.7 - 4.0 K/uL   Monocytes Absolute 1.0  0.1 - 1.0 K/uL   Eosinophils Absolute 0.1  0.0 - 0.7 K/uL   Basophils Absolute 0.0  0.0 - 0.1 K/uL   RBC Morphology POLYCHROMASIA PRESENT     Comment: TARGET CELLS   WBC Morphology ATYPICAL LYMPHOCYTES    TRIGLYCERIDES     Status: None   Collection Time    09/02/13  5:15 AM      Result Value Ref Range   Triglycerides 123  <150 mg/dL  GLUCOSE, CAPILLARY     Status: Abnormal   Collection Time    09/02/13  7:33 AM  Result Value Ref Range   Glucose-Capillary 112 (*) 70 - 99 mg/dL   Comment 1 Notify RN      Imaging / Studies: Dg Chest Port 1 View  09/01/2013   CLINICAL DATA:  Tracheostomy tube  EXAM: PORTABLE CHEST - 1 VIEW  COMPARISON:  DG CHEST 1V PORT dated 08/31/2013; DG CHEST 1V PORT dated 08/29/2013; DG CHEST 1V PORT dated 08/28/2013; CT CHEST W/O CM dated 08/29/2013  FINDINGS: Grossly unchanged cardiac silhouette and mediastinal  contours given decreased lung volumes and patient rotation. Stable position of support apparatus. Bilateral perihilar predominant heterogeneous airspace opacities are unchanged minimally worsened in the interval. Trace bilateral effusions are suspected with minimal amount of fluid layering along the peripheral aspect of the left upper lung. No definite pneumothorax. Unchanged bones.  IMPRESSION: No change to slight worsening of perihilar predominant airspace opacities, likely alveolar pulmonary edema, though note, infection, aspiration and pulmonary hemorrhage remain differential considerations.   Electronically Signed   By: Sandi Mariscal M.D.   On: 09/01/2013 08:17    Medications / Allergies: per chart  Antibiotics: Anti-infectives   Start     Dose/Rate Route Frequency Ordered Stop   08/29/13 1200  sulfamethoxazole-trimethoprim (BACTRIM) 400 mg of trimethoprim in dextrose 5 % 500 mL IVPB     400 mg of trimethoprim 350 mL/hr over 90 Minutes Intravenous Every 12 hours 08/29/13 1149     08/28/13 1900  vancomycin (VANCOCIN) 1,250 mg in sodium chloride 0.9 % 250 mL IVPB  Status:  Discontinued     1,250 mg 166.7 mL/hr over 90 Minutes Intravenous Every 48 hours 08/26/13 1758 08/27/13 0900   08/27/13 0930  linezolid (ZYVOX) IVPB 600 mg  Status:  Discontinued     600 mg 300 mL/hr over 60 Minutes Intravenous Every 12 hours 08/27/13 0900 08/29/13 1057   08/26/13 1900  vancomycin (VANCOCIN) 1,750 mg in sodium chloride 0.9 % 500 mL IVPB     1,750 mg 250 mL/hr over 120 Minutes Intravenous  Once 08/26/13 1758 08/26/13 2044   08/18/13 1400  piperacillin-tazobactam (ZOSYN) IVPB 2.25 g  Status:  Discontinued     2.25 g 100 mL/hr over 30 Minutes Intravenous 3 times per day 08/18/13 1124 08/29/13 1057   08/18/13 0500  piperacillin-tazobactam (ZOSYN) IVPB 3.375 g  Status:  Discontinued     3.375 g 12.5 mL/hr over 240 Minutes Intravenous 3 times per day 08/18/13 0434 08/18/13 1124      Assessment/Plan:  1.Pod  15/13 reexploration with small bowel anastmosis, colostomy and g tube  2. VDRF 3. Anemia, ABL secondary to melena from GI tract and trach  4. PCM/TNA/TF   Angio 2/13 without active bleeding.  H&H down to 7.9/22.4. He is not a surgical candidate. Will continue to follow.    Erby Pian, [facility] Pager [phone] Office [phone]  09/02/2013 8:22 AM

## 2013-09-02 NOTE — Progress Notes (Signed)
H&H trended down since yesterday.   abd soft, nt. Feeding tube intact. Min TF residuals  Can adv TF to 20cc/hr ABL anemia - monitor hgb. Not a surgical candidate. If hgb drops significantly - tagged scan and repeat IR angio; however, another angio may be negative and would def harm kidneys.   Discussed with wife and grandson rec Palliative care consult to discuss code status/goals of care.  Not sure of pt's long term prognosis. Explained that his chance of living independent is low. Explained that i thought a [place] would be helpful to address code status that in the event he codes/gets sicker some of these decisions will already have been made. Family appears to be unrealistic that if he has another major event of his chance of recovery. "he is a Nurse, adult". "i want everything done"  Leighton Ruff. Redmond Pulling, MD, FACS General, Bariatric, & Minimally Invasive Surgery [facility], [place]

## 2013-09-02 NOTE — Progress Notes (Addendum)
NUTRITION FOLLOW UP  Intervention:    TPN per pharmacy  Vital 1.5 formula at 20 ml/hr with Prostat liquid protein 30 ml TID via tube (1020 total kcals, 77 gm protein, 367 ml of free water) RD to follow for nutrition care plan  Nutrition Dx:   Inadequate oral intake related to inability to eat as evidenced by NPO status, ongoing  Goal:   Pt to meet >/= 90% of their estimated nutrition needs, met  Monitor:   EN regimen & tolerance, TPN prescription, respiratory status, weight, labs, I/O's  Assessment:   73 year old male with SBO and ischemic bowel s/p resection at [facility]. Was transferred to [facility] for continuing hemodialysis and inability to wean from ventilator. Admitted on 2/1 after he developed a new bowel perforation at his anastomosis site and is s/p ex lap with small bowel resection and wound vac placement. S/P trach on 1/22.   Patient s/p procedures 2/3:  SMALL BOWEL ANASTOMOSIS  STAMM GASTROSTOMY  END SIGMOID COLOSTOMY  Patient is currently on ventilator support -- trach MV: 15.7 L/min Temp (24hrs), Avg:99.7 F (37.6 C), Min:99.4 F (37.4 C), Max:100.2 F (37.9 C)   Vital 1.5 formula infusing at 10 ml/hr via G-tube.  Prostat liquid protein 30 ml TID via tube.  Angio 2/13 showed no active bleeding.  Per Dr. Redmond Pulling can increase to 20 ml/hr -- adjusted orders.  Nephrology following for HD.  Patient is currently receiving TPN with Clinimix 5/15 @ 60 ml/hr and lipids @ 10 ml/hr. Provides 1502 kcal and 72 grams protein per day.  Vital 1.5 formula at 20 ml/hr with liquid protein & TPN prescription meets 120% minimum estimated energy needs and 124% minimum estimated protein needs.   Noted overall poor prognosis.  Palliative Care Team consulted for goals of care.  Height: Ht Readings from Last 1 Encounters:  08/18/13 _0  (1.803 m)    Weight Status:   Wt Readings from Last 1 Encounters:  09/02/13 169 lb 12.1 oz (77 kg)    Re-estimated needs:  Kcal:  2100-2300 Protein: 120-135 gm Fluid: 2.2-2.4 L  Skin: stage 1 sacral wound; wound VAC to surgical abdominal wound  Diet Order: NPO   Intake/Output Summary (Last 24 hours) at 09/02/13 1314 Last data filed at 09/02/13 1300  Gross per 24 hour  Intake   3525 ml  Output   5100 ml  Net  -1575 ml    Last BM: 2/9-watery  Labs:   Recent Labs Lab 08/29/13 0320 08/30/13 0405 08/31/13 0345 09/01/13 0500 09/02/13 0515  NA 149* 145 145 146 140  K 3.4* 3.0* 3.2* 3.4* 3.1*  CL 111 106 106 105 100  CO2 _1 BUN 91* 86* 87* 89* 96*  CREATININE 2.58* 2.54* 2.50* 2.53* 2.73*  CALCIUM 7.1* 7.5* 7.3* 7.6* 7.3*  MG 1.5 1.9  --   --  1.8  PHOS 3.8 4.4 4.8* 5.1* 4.3  GLUCOSE 225* 197* 162* 132* 128*    CBG (last 3)   Recent Labs  09/02/13 0345 09/02/13 0733 09/02/13 1203  GLUCAP 153* 112* 134*    Scheduled Meds: . antiseptic oral rinse  15 mL Mouth Rinse QID  . chlorhexidine  15 mL Mouth Rinse BID  . darbepoetin (ARANESP) injection - NON-DIALYSIS  100 mcg Subcutaneous Q Mon-1800  . feeding supplement (PRO-STAT SUGAR FREE 64)  30 mL Per Tube TID  . insulin aspart  0-15 Units Subcutaneous 6 times per day  . metoprolol  5 mg  Intravenous Q4H  . [START ON 09/03/2013] pantoprazole (PROTONIX) IV  40 mg Intravenous Q24H  . sulfamethoxazole-trimethoprim  400 mg of trimethoprim Intravenous Q12H    Continuous Infusions: . TPN (CLINIMIX) Adult without lytes     And  . fat emulsion    . feeding supplement (VITAL 1.5 CAL) Stopped (08/30/13 1640)    Kallie Locks Dietetic Intern Pager: [phone]

## 2013-09-02 NOTE — Progress Notes (Signed)
RT Note: Placed pt back on full support at this time due to inc RR and WOB. RT will continue to monitor.  Lissa Merlin

## 2013-09-02 NOTE — Progress Notes (Signed)
Patient ID: Jason Harmon, male   DOB: 01-30-1941, 73 y.o.   MRN: [id number] S:frail, chronically ill-appearing WM with trach O:BP 148/84  Pulse 109  Temp(Src) 99.7 F (37.6 C) (Axillary)  Resp 27  Ht 5\' 11"  (1.803 m)  Wt 77 kg (169 lb 12.1 oz)  BMI 23.69 kg/m2  SpO2 95%  Intake/Output Summary (Last 24 hours) at 09/02/13 1007 Last data filed at 09/02/13 1000  Gross per 24 hour  Intake   3565 ml  Output   5050 ml  Net  -1485 ml   Intake/Output: I/O last 3 completed shifts: In: 5200 [I.V.:720; Other:180; NG/GT:530; IV Piggyback:1250] Out: 3546 [Urine:6760; FKCLE:7517]  Intake/Output this shift:  Total I/O In: 470 [I.V.:80; Other:30; NG/GT:150; TPN:210] Out: 1075 [Urine:925; Stool:150] Weight change: -0.4 kg (-14.1 oz) GYF:VCBSWHQPR WM nonverbal and not following commands FFM:BWGYK Resp:scattered rhonchi Abd:+bs, soft Ext:+pedal edema   Recent Labs Lab 08/28/13 0413 08/28/13 1450 08/29/13 0320 08/30/13 0405 08/31/13 0345 09/01/13 0500 09/02/13 0515  NA 147 145 149* 145 145 146 140  K 2.9* 3.3* 3.4* 3.0* 3.2* 3.4* 3.1*  CL 112 109 111 106 106 105 100  CO2 19 21 22 25 25 25 25   GLUCOSE 166* 140* 225* 197* 162* 132* 128*  BUN 95* 95* 91* 86* 87* 89* 96*  CREATININE 2.77* 2.68* 2.58* 2.54* 2.50* 2.53* 2.73*  ALBUMIN 1.3* 1.5* 1.3* 1.3* 1.2* 1.5* 1.5*  CALCIUM 7.1* 7.3* 7.1* 7.5* 7.3* 7.6* 7.3*  PHOS 3.5 3.8 3.8 4.4 4.8* 5.1* 4.3  AST  --   --  20  --   --  34 36  ALT  --   --  17  --   --  29 35   Liver Function Tests:  Recent Labs Lab 08/29/13 0320  08/31/13 0345 09/01/13 0500 09/02/13 0515  AST 20  --   --  34 36  ALT 17  --   --  29 35  ALKPHOS 79  --   --  262* 244*  BILITOT 1.5*  --   --  1.5* 1.2  PROT 4.2*  --   --  5.4* 5.1*  ALBUMIN 1.3*  < > 1.2* 1.5* 1.5*  < > = values in this interval not displayed. No results found for this basename: LIPASE, AMYLASE,  in the last 168 hours No results found for this basename: AMMONIA,  in the last 168  hours CBC:  Recent Labs Lab 08/30/13 1500 08/31/13 0345 08/31/13 1510 09/01/13 0500 09/02/13 0515  WBC 24.6* 19.7* 20.3* 18.5* 12.2*  NEUTROABS  --   --   --   --  10.6*  HGB 7.9* 6.4* 9.3* 9.1* 7.9*  HCT 21.9* 17.8* 26.3* 25.2* 22.4*  MCV 89.0 87.7 89.5 87.8 90.0  PLT 123* 124* 124* 131* 120*   Cardiac Enzymes: No results found for this basename: CKTOTAL, CKMB, CKMBINDEX, TROPONINI,  in the last 168 hours CBG:  Recent Labs Lab 09/01/13 1626 09/01/13 1936 09/01/13 2344 09/02/13 0345 09/02/13 0733  GLUCAP 140* 108* 110* 153* 112*    Iron Studies: No results found for this basename: IRON, TIBC, TRANSFERRIN, FERRITIN,  in the last 72 hours Studies/Results: Dg Chest Port 1 View  09/01/2013   CLINICAL DATA:  Tracheostomy tube  EXAM: PORTABLE CHEST - 1 VIEW  COMPARISON:  DG CHEST 1V PORT dated 08/31/2013; DG CHEST 1V PORT dated 08/29/2013; DG CHEST 1V PORT dated 08/28/2013; CT CHEST W/O CM dated 08/29/2013  FINDINGS: Grossly unchanged cardiac silhouette and mediastinal contours given  decreased lung volumes and patient rotation. Stable position of support apparatus. Bilateral perihilar predominant heterogeneous airspace opacities are unchanged minimally worsened in the interval. Trace bilateral effusions are suspected with minimal amount of fluid layering along the peripheral aspect of the left upper lung. No definite pneumothorax. Unchanged bones.  IMPRESSION: No change to slight worsening of perihilar predominant airspace opacities, likely alveolar pulmonary edema, though note, infection, aspiration and pulmonary hemorrhage remain differential considerations.   Electronically Signed   By: Sandi Mariscal M.D.   On: 09/01/2013 08:17   . antiseptic oral rinse  15 mL Mouth Rinse QID  . chlorhexidine  15 mL Mouth Rinse BID  . darbepoetin (ARANESP) injection - NON-DIALYSIS  100 mcg Subcutaneous Q Mon-1800  . feeding supplement (PRO-STAT SUGAR FREE 64)  30 mL Per Tube TID  . furosemide  80 mg  Intravenous Q12H  . insulin aspart  0-15 Units Subcutaneous 6 times per day  . metoprolol  5 mg Intravenous Q4H  . pantoprazole (PROTONIX) IV  40 mg Intravenous Q12H  . potassium chloride  40 mEq Per Tube Once  . sodium chloride  10-40 mL Intracatheter Q12H  . sulfamethoxazole-trimethoprim  400 mg of trimethoprim Intravenous Q12H    BMET    Component Value Date/Time   NA 140 09/02/2013 0515   K 3.1* 09/02/2013 0515   CL 100 09/02/2013 0515   CO2 25 09/02/2013 0515   GLUCOSE 128* 09/02/2013 0515   BUN 96* 09/02/2013 0515   CREATININE 2.73* 09/02/2013 0515   CALCIUM 7.3* 09/02/2013 0515   GFRNONAA 22* 09/02/2013 0515   GFRAA 26* 09/02/2013 0515   CBC    Component Value Date/Time   WBC 12.2* 09/02/2013 0515   RBC 2.49* 09/02/2013 0515   HGB 7.9* 09/02/2013 0515   HCT 22.4* 09/02/2013 0515   PLT 120* 09/02/2013 0515   MCV 90.0 09/02/2013 0515   MCH 31.7 09/02/2013 0515   MCHC 35.3 09/02/2013 0515   RDW 15.7* 09/02/2013 0515   LYMPHSABS 0.5* 09/02/2013 0515   MONOABS 1.0 09/02/2013 0515   EOSABS 0.1 09/02/2013 0515   BASOSABS 0.0 09/02/2013 0515    Assessment/Plan: 41M with dialysis dependent prolonged AKI- but no HD since 2/2, with chronic ventilatory failure, small bowel perforation (repaired 08/20/13), abdominal septic shock  1. Non-oliguric AKI due to ischemic ATN with slow recovery but is no longer dialysis dependent.  Recent bump in BUN/Cr. 1. Will stop lasix and follow given his marked UOP and likely prerenal azotemia from diuresis and GI Blood loss 2. Would not offer further HD as this would not fix the underlying issue and would be futile care.  His trach will necessitate LTAC placement for the rest of his life and would strongly recommend palliative care consult to help set goals/limits of care 3. Watch sulfamethoxazole dose in advanced CKD 2. SIRS from small bowel perforation- followed by [facility]  3. Hypokalemia- replete gently given worsening renal function 4. ABLA- transfused  regularly 5. VDRF s/p trach- per PCCM 6. Dispo- poor overall prognosis.  Recommend palliative care consult to help set goals/limits of care  [place] A

## 2013-09-02 NOTE — Progress Notes (Signed)
[facility] ADULT ICU REPLACEMENT PROTOCOL FOR AM LAB REPLACEMENT ONLY  The patient does not apply for the [facility] Adult ICU Electrolyte Replacment Protocol based on the criteria listed below:   1. Is GFR >/= 40 ml/min? no  Patient's GFR today is 26 2. Is urine output >/= 0.5 ml/kg/hr for the last 6 hours? no Patient's UOP is  ml/kg/hr 3. Is BUN < 60 mg/dL? no  Patient's BUN today is 96 4. Abnormal electrolyte(s): K 3.15. Ordered repletion with: NA 6. If a panic level lab has been reported, has the CCM MD in charge been notified? no.   Physician:    Ronda Fairly A 09/02/2013 6:40 AM

## 2013-09-02 NOTE — Progress Notes (Addendum)
PARENTERAL NUTRITION CONSULT NOTE - FOLLOW UP  Pharmacy Consult for TPN Indication: Perforated viscous  No Known Allergies  Patient Measurements: Height: 5\' 11"  (180.3 cm) (5'11") Weight: 169 lb 12.1 oz (77 kg) IBW/kg (Calculated) : 75.3 Adjusted Body Weight:  Usual Weight:   Vital Signs: Temp: 99.7 F (37.6 C) (02/16 0728) Temp src: Axillary (02/16 0728) BP: 143/80 mmHg (02/16 1000) Pulse Rate: 104 (02/16 1000) Intake/Output from previous day: 02/15 0701 - 02/16 0700 In: 3329 [I.V.:510; NG/GT:330; IV Piggyback:725; TPN:1680] Out: 4750 [Urine:4125; Stool:625] Intake/Output from this shift: Total I/O In: 470 [I.V.:80; Other:30; NG/GT:150; TPN:210] Out: 1075 [Urine:925; Stool:150]  Labs:  Recent Labs  08/31/13 1510 09/01/13 0500 09/02/13 0515  WBC 20.3* 18.5* 12.2*  HGB 9.3* 9.1* 7.9*  HCT 26.3* 25.2* 22.4*  PLT 124* 131* 120*     Recent Labs  08/31/13 0345 09/01/13 0500 09/02/13 0515  NA 145 146 140  K 3.2* 3.4* 3.1*  CL 106 105 100  CO2 25 25 25   GLUCOSE 162* 132* 128*  BUN 87* 89* 96*  CREATININE 2.50* 2.53* 2.73*  CALCIUM 7.3* 7.6* 7.3*  MG  --   --  1.8  PHOS 4.8* 5.1* 4.3  PROT  --  5.4* 5.1*  ALBUMIN 1.2* 1.5* 1.5*  AST  --  34 36  ALT  --  29 35  ALKPHOS  --  262* 244*  BILITOT  --  1.5* 1.2  TRIG  --   --  123   Estimated Creatinine Clearance: 26.8 ml/min (by C-G formula based on Cr of 2.73).    Recent Labs  09/01/13 2344 09/02/13 0345 09/02/13 0733  GLUCAP 110* 153* 112*    Medications:  Scheduled:  . antiseptic oral rinse  15 mL Mouth Rinse QID  . chlorhexidine  15 mL Mouth Rinse BID  . darbepoetin (ARANESP) injection - NON-DIALYSIS  100 mcg Subcutaneous Q Mon-1800  . feeding supplement (PRO-STAT SUGAR FREE 64)  30 mL Per Tube TID  . furosemide  80 mg Intravenous Q12H  . insulin aspart  0-15 Units Subcutaneous 6 times per day  . metoprolol  5 mg Intravenous Q4H  . pantoprazole (PROTONIX) IV  40 mg Intravenous Q12H  .  potassium chloride  40 mEq Per Tube Once  . sodium chloride  10-40 mL Intracatheter Q12H  . sulfamethoxazole-trimethoprim  400 mg of trimethoprim Intravenous Q12H    Insulin Requirements in the past 24 hours:  4 units mod SSI/12h & 20 units insulin in TPN   Current Nutrition:  -Clinimix E 5/15 at 60 ml/hr + lipids at 10 ml/hr- 72 gm of protein and 1584 kcals  -Vital 1.5 at 60mL/hr + Prostat 4mL TID- provides 61g protein and 660kcal  (Goal rate is 54mL/hr with Prostat liquid 80mL PT TID. This will provide 134g protein and 2280 kcal)   Nutritional Goals:  2100-2300 kCal, 120-135 grams of protein per day per RD recommendations 2/9   Admit: 73 year old male with SBO and ischemic bowel s/p resection at [facility]. Was transferred to [facility] for continuing hemodialysis and inability to wean from ventilator. developed a new bowel perforation at his anastomosis site and is s/p ex lap (2/1) with small bowel resection and wound vac placement. Now with melena in ostomy- surgery considering angio. Surgery may be needed though he is high risk   GI: new bowel perforation s/p ex lap; was on TPN at [facility]. To OR 2/3 for small bowel anastomosis, colostomy, gastrostomy tube. (+)tagged RBC study, hence mesenteric  angiogram 2/13 & no bleeding source found. Abd soft, ND.  Ostomy output doesn't appear melanotic.  PPI-IV   Nutrition: TF started 2/9- pt had emesis when TF turned up to 43mL/hr on 2/9- rate was turned down to 78mL/hr & has continued at this rate with Pro-Stat TID- tolerating with minimal residuals. Unable to advance as pt with frank melena which has required transfusion   Endo: CBGs 108-153, with increased insulin in TPN   Lytes: K 3.1- no K runs by renal this AM, Na 140, Phos 4.3, Mg wnl & has been for several days.  Discussed K with renal, will give 66mEq per tube x 1.  MD will also decrease Lasix.  Renal:  Hasn't had HD since 2/2, & only tolerated 1 hour at that time d/t tachycardia and decreased BP.  Creat 2.73- upward trend; UOP 2.3 ml/kg/hr. Fluid overload felt to be primarily d/t transfusions.  Lasix bid- to be decreased  Hepatobil:  AlkPhos 244- improved this AM.  Last Palb 13.2, currently pending.  Heme: Hemoptysis and melanotic stools. Hg 7.9- larger drop today, last transfusion 2/13. No source of bleeding found on angiogram; multiple transfusions. Aranesp 141mcg SQ qMon   ID: Abx at [facility]: Flagyl 1/21>>2/1, Meropenem 1/24>>2/1  Zosyn 2/1>> (CCM noted stated 7 days total, today is day #11)  Vanc 2/9>>2/10  Linezolid 2/10>> 2/12  Bactrim IV 2/12 >>  2/9 trach aspirate: Stenotrophomonas maltophilia, s: SMX/TMP  WBC 12.2- continues to improve, afeb  TPN Access: CVC   Plan:  - TF to continue at 11mL/hr today + Prostat 27mL TID (61g protein + 660kcal)  - Cont Clinimix 5/15 (no lytes) today at same rate of 60 ml/hr + 20% lipids at 10 ml/hr. This provides 72 gm of protein and 1584 kcals. Combined with TF, providing 100% of nutritional needs  - May need to alternate with lytes and without lytes  - Continue Insulin to 20 units per bag. Continue SSI q4h.  - TPN labs as ordered- BMET, Mg, Phos in AM  - Follow for ability to increase TF rate after GI bleeding resolved and wean off TPN   Gracy Bruins, PharmD [facility]

## 2013-09-02 NOTE — Consult Note (Signed)
WOC ostomy follow up Stoma type/location:  RLQ, end colostomy Stomal assessment/size: 1 3/4" oval shaped, minimally budded from the skin Peristomal assessment: intact and much improved Treatment options for stomal/peristomal skin: added 2" barrier ring for flush stoma Output liquid brown and melena noted, using BSD to manage output Ostomy pouching: 1pc.small E[place] being used to manage ostomy at this time due to the location in proximity of the ostomy to the wound and the patient's abdomen has been so edematous and had multiple skin issues.  The Shirlean Schlein has worked very well and this pouch lasted 1wk.   Education provided: pt on vent and cognitively not appropriate for teaching right now.  Wife is not ready to learn at this time either.   WOC will follow along with you for teaching and care when appropriate. [place], [place]

## 2013-09-02 NOTE — Progress Notes (Signed)
PULMONARY  / CRITICAL CARE MEDICINE  Name: Jason Harmon MRN: [id number] DOB: Jan 20, 1941    ADMISSION DATE:  08/18/2013 LOS 15 days   CHIEF COMPLAINT:  Bowel perforation  BRIEF PATIENT DESCRIPTION:  73 yo male with SBO/ischemic bowel s/p resection with subsequent complicated medical course including respiratory failure and inability to wean from ventilator,admitted from [facility] with bowel perforation at anastomosis site.  SIGNIFICANT EVENTS: 1/22 Tracheostomy 2/01 Transfer from [facility], to ER, to [facility] ICU.  CCS consulted 2/1 Exploratory laparotomy with segmental resection of small bowel anastomosis and sigmoid colon with placement of abdominal wound vac 2/02 Off pressors, did not tolerate HD 2/4 small bowel anastomosis with gastrostomy & End sigmoid colostomy 2/8 ATC x 24h 2/9 Hemoptysis, anemia >> PRBC transfusion; CXR with new infiltrate 2/10 Red rash from vancomycin, increased WOB 2/12 2 units PRBC transfusion 2/13 Transfuse PRBC 2/13 GI Nuclear scan -: Active GI bleeding from a loop of small bowel to the right of midline at or near the level of the pelvic brim, likely corresponding to the enteroenteric anastomosis   2/13 IR angiogram-no evidence of active bleed or AVM .  2/14 Still has sputum with blood, but less.  Continues to have melena and needing PRBC's.Unable to tolerate trach collar d/t increased wob     STUDIES:  CT A/P with contrast 1/31 >>  bowel perforation, likely anastomotic breakdown at proximal enteroenterostomy with pneumoperitoneum and spilling of oral contrast, moderate ascites with peritonitis CT chest 2/12 >> b/l GGO, Lt > Rt pleural effusions  LINES / TUBES: Tracheostomy 1/22>>> R IJ HD catheter 1/12>>>  CULTURES: Sputum 2/9 >> Moderate Stenotrophomonas maltophilia  ANTIBIOTICS: Zosyn 2/1 >> 2/12 Vancomycin 2/9 >> 2/10 Linezolid 2/10 >> 2/12 Bactrim 2/12 >>   SUBJECTIVE: No new complaints. No distress on PSV. RASS 0 to +1   PHYSICAL EXAM VITAL  SIGNS: Temp:  [98.8 F (37.1 C)-100.2 F (37.9 C)] 98.8 F (37.1 C) (02/16 1547) Pulse Rate:  [91-109] 109 (02/16 1800) Resp:  [19-35] 27 (02/16 1800) BP: (106-151)/(68-90) 151/90 mmHg (02/16 1800) SpO2:  [94 %-100 %] 97 % (02/16 1800) FiO2 (%):  [40 %] 40 % (02/16 1600) Weight:  [77 kg (169 lb 12.1 oz)] 77 kg (169 lb 12.1 oz) (02/16 0500) VENT SETTINGS: Vent Mode:  [-] PRVC FiO2 (%):  [40 %] 40 % Set Rate:  [16 bmp-24 bmp] 16 bmp Vt Set:  [600 mL] 600 mL PEEP:  [5 cmH20-8 cmH20] 5 cmH20 Pressure Support:  [10 cmH20] 10 cmH20 Plateau Pressure:  [26 cmH20-33 cmH20] 28 cmH20 INTAKE / OUTPUT: Intake/Output     02/15 0701 - 02/16 0700 02/16 0701 - 02/17 0700   I.V. (mL/kg) 510 (6.6) 100 (1.3)   Blood     Other 180 30   NG/GT 330 280   IV Piggyback 725 500   TPN 1680 770   Total Intake(mL/kg) 3425 (44.5) 1680 (21.8)   Urine (mL/kg/hr) 4125 (2.2) 2450 (2.7)   Stool 625 (0.3) 350 (0.4)   Total Output 4750 2800   Net -1325 -1120          PHYSICAL EXAMINATION: General: NAD Neuro: No focal deficits HEENT:  Trach site with mild bloody secretions Cardiovascular:  Tachycardic, regular.  No M/R/G. Lungs: scant  rhonchi, no wheeze Abdomen:  Dressings in place.  Melanotic appearing drainage in ostomy bag.  BS+  Musculoskeletal:  1-2+ edema, non-pitting. Skin:  Mid abd wound /dsg intact dry /clean , no rash  LABS: I have reviewed all of  today's lab results. Relevant abnormalities are discussed in the A/P section    ASSESSMENT / PLAN:  PULMONARY A: Prolonged vent dependence post op.   S/p Tracheostomy 1/22. Hemoptysis, improved P: Cont vent wean as tolerated  CARDIOVASCULAR A: Septic shock, resolved Hx of A fib >> previously on amiodarone. P: -continue current rx  RENAL A: Acute kidney injury, improving Anion gap metabolic acidosis, resolved. P: Monitor BMET intermittently Monitor I/Os Correct electrolytes as indicated   GASTROINTESTINAL A: Post  laparotomy Protein calorie malnutrition. Recurrent GI bleed - suspect from anastamosis. P: -post-op care per CCS -advance diet per CCS -protonix for SUP -Per Dr Redmond Pulling, not a candidate for repeat surgery  HEMATOLOGIC A: Anemia of critical illness acute blood loss anemia P: -SCD for DVT prevention -f/u H/H after transfusion -transfuse for Hb < 7   INFECTIOUS A: Severe sepsis, resolved HCAP P: Micro and abx as above  ENDOCRINE A: Hyperglycemia. P: Cont SSI  NEUROLOGIC A: Acute encephalopathy, improving Deconditioning. P: Palliative care consult 2/16 Cont PT/OT Plan LTACH    The patient is critically ill with multiple organ systems failure and requires high complexity decision making for assessment and support, frequent evaluation and titration of therapies, application of advanced monitoring technologies and extensive interpretation of multiple databases.   Critical Care Time devoted to patient care services described in this note is  35  Minutes.   Merton Border, MD ; [facility] [phone].  After 5:30 PM or weekends, call [phone]  09/02/2013 6:50 PM

## 2013-09-02 NOTE — Progress Notes (Signed)
Orthopedic Tech Progress Note Patient Details:  Jason Harmon 08-Jan-1941 [id number] rn to alternate boot Ortho Devices Type of Ortho Device: Postop shoe/boot Ortho Device/Splint Location: prafo boot RLE Ortho Device/Splint Interventions: Ordered;Application   Braulio Bosch 09/02/2013, 5:51 PM

## 2013-09-02 NOTE — Progress Notes (Signed)
ANTIBIOTIC CONSULT NOTE - FOLLOW UP  Pharmacy Consult for Septra Indication: pneumonia  No Known Allergies Patient Measurements: Height: 5\' 11"  (180.3 cm) (5'11") Weight: 169 lb 12.1 oz (77 kg) IBW/kg (Calculated) : 75.3 Vital Signs: Temp: 99.7 F (37.6 C) (02/16 1205) Temp src: Axillary (02/16 1205) BP: 129/77 mmHg (02/16 1300) Pulse Rate: 108 (02/16 1300) Intake/Output from previous day: 02/15 0701 - 02/16 0700 In: 3425 [I.V.:510; NG/GT:330; IV Piggyback:725; TPN:1680] Out: 4750 [Urine:4125; Stool:625] Intake/Output from this shift: Total I/O In: 730 [I.V.:100; Other:30; NG/GT:180; TPN:420] Out: 1875 [Urine:1725; Stool:150] Labs:  Recent Labs  08/31/13 0345 08/31/13 1510 09/01/13 0500 09/02/13 0515  WBC 19.7* 20.3* 18.5* 12.2*  HGB 6.4* 9.3* 9.1* 7.9*  PLT 124* 124* 131* 120*  CREATININE 2.50*  --  2.53* 2.73*   Estimated Creatinine Clearance: 26.8 ml/min (by C-G formula based on Cr of 2.73).  Microbiology: 1/31 Bld x2 ([facility]): neg 2/9 trach asp: + Stenotrophomonas ss Septra  Assessment: 75 YOM on Septra day#5 for stenotrophomonas pneumonia. SCr up to 2.73 (last HD was on 2/2 and per nephrology progress notes will not be receiving any further HD). Current CrCl~20-10mL/min. UOP ~2cc/kg/hr. WBC is trending down. Tmax 100.2  Goal of Therapy:  Clinical resolution of infection  Plan:  Continue Septra at 400mg  IV q12h.  Continue to monitor renal function and adjust dosing as needed.   Sloan Leiter, PharmD, BCPS Clinical Pharmacist [phone] 09/02/2013,3:03 PM

## 2013-09-03 ENCOUNTER — Inpatient Hospital Stay (HOSPITAL_COMMUNITY): Payer: Medicare Other

## 2013-09-03 DIAGNOSIS — Z515 Encounter for palliative care: Secondary | ICD-10-CM

## 2013-09-03 LAB — GLUCOSE, CAPILLARY
GLUCOSE-CAPILLARY: 103 mg/dL — AB (ref 70–99)
GLUCOSE-CAPILLARY: 140 mg/dL — AB (ref 70–99)
Glucose-Capillary: 138 mg/dL — ABNORMAL HIGH (ref 70–99)
Glucose-Capillary: 138 mg/dL — ABNORMAL HIGH (ref 70–99)
Glucose-Capillary: 147 mg/dL — ABNORMAL HIGH (ref 70–99)
Glucose-Capillary: 119 mg/dL — ABNORMAL HIGH (ref 70–99)

## 2013-09-03 LAB — BASIC METABOLIC PANEL
BUN: 96 mg/dL — ABNORMAL HIGH (ref 6–23)
CHLORIDE: 102 meq/L (ref 96–112)
CO2: 24 mEq/L (ref 19–32)
CREATININE: 2.61 mg/dL — AB (ref 0.50–1.35)
Calcium: 7.4 mg/dL — ABNORMAL LOW (ref 8.4–10.5)
GFR calc non Af Amer: 23 mL/min — ABNORMAL LOW (ref >90)
GFR, EST AFRICAN AMERICAN: 27 mL/min — AB (ref >90)
Glucose, Bld: 138 mg/dL — ABNORMAL HIGH (ref 70–99)
Potassium: 3 mEq/L — ABNORMAL LOW (ref 3.7–5.3)
SODIUM: 144 meq/L (ref 137–147)

## 2013-09-03 LAB — PHOSPHORUS: PHOSPHORUS: 3.5 mg/dL (ref 2.3–4.6)

## 2013-09-03 LAB — HEMOGLOBIN AND HEMATOCRIT, BLOOD
HCT: 26.2 % — ABNORMAL LOW (ref 39.0–52.0)
Hemoglobin: 9.1 g/dL — ABNORMAL LOW (ref 13.0–17.0)

## 2013-09-03 MED ORDER — FAT EMULSION 20 % IV EMUL
240.0000 mL | INTRAVENOUS | Status: DC
Start: 2013-09-03 — End: 2013-09-04
  Administered 2013-09-03: 240 mL via INTRAVENOUS
  Filled 2013-09-03: qty 250

## 2013-09-03 MED ORDER — POTASSIUM CHLORIDE 20 MEQ/15ML (10%) PO LIQD
40.0000 meq | Freq: Once | ORAL | Status: AC
  Administered 2013-09-03: 40 meq
  Filled 2013-09-03: qty 30

## 2013-09-03 MED ORDER — TRACE MINERALS CR-CU-F-FE-I-MN-MO-SE-ZN IV SOLN
INTRAVENOUS | Status: DC
  Administered 2013-09-03: 17:00:00 via INTRAVENOUS
  Filled 2013-09-03: qty 2000

## 2013-09-03 NOTE — Progress Notes (Signed)
PARENTERAL NUTRITION CONSULT NOTE - FOLLOW UP  Pharmacy Consult for TPN Indication: Perforated viscous  No Known Allergies  Patient Measurements: Height: 5\' 11"  (180.3 cm) (5'11") Weight: 167 lb 1.7 oz (75.8 kg) IBW/kg (Calculated) : 75.3 Adjusted Body Weight:  Usual Weight:   Vital Signs: Temp: 99.3 F (37.4 C) (02/17 0400) Temp src: Oral (02/17 0400) BP: 141/87 mmHg (02/17 0600) Pulse Rate: 109 (02/17 0600) Intake/Output from previous day: 02/16 0701 - 02/17 0700 In: 2960 [I.V.:100; NG/GT:580; IV Piggyback:500; TPN:1600] Out: 4800 [Urine:3950; Stool:850] Intake/Output from this shift: Total I/O In: 1190 [Other:150; NG/GT:280; TPN:760] Out: 2000 [Urine:1500; Stool:500]  Labs:  Recent Labs  08/31/13 1510 09/01/13 0500 09/02/13 0515  WBC 20.3* 18.5* 12.2*  HGB 9.3* 9.1* 7.9*  HCT 26.3* 25.2* 22.4*  PLT 124* 131* 120*     Recent Labs  09/01/13 0500 09/02/13 0515 09/03/13 0430  NA 146 140 144  K 3.4* 3.1* 3.0*  CL 105 100 102  CO2 25 25 24   GLUCOSE 132* 128* 138*  BUN 89* 96* 96*  CREATININE 2.53* 2.73* 2.61*  CALCIUM 7.6* 7.3* 7.4*  MG  --  1.8  --   PHOS 5.1* 4.3 3.5  PROT 5.4* 5.1*  --   ALBUMIN 1.5* 1.5*  --   AST 34 36  --   ALT 29 35  --   ALKPHOS 262* 244*  --   BILITOT 1.5* 1.2  --   PREALBUMIN  --  12.5*  --   TRIG  --  123  --    Estimated Creatinine Clearance: 28 ml/min (by C-G formula based on Cr of 2.61).    Recent Labs  09/02/13 1956 09/02/13 2356 09/03/13 0419  GLUCAP 110* 138* 138*    Medications:  Scheduled:  . antiseptic oral rinse  15 mL Mouth Rinse QID  . chlorhexidine  15 mL Mouth Rinse BID  . darbepoetin (ARANESP) injection - NON-DIALYSIS  100 mcg Subcutaneous Q Mon-1800  . feeding supplement (PRO-STAT SUGAR FREE 64)  30 mL Per Tube TID  . insulin aspart  0-15 Units Subcutaneous 6 times per day  . metoprolol  5 mg Intravenous Q4H  . pantoprazole (PROTONIX) IV  40 mg Intravenous Q24H  .  sulfamethoxazole-trimethoprim  400 mg of trimethoprim Intravenous Q12H    Insulin Requirements in the past 24 hours:  9 units mod SSI/12h & 20 units insulin in TPN   Current Nutrition:  -Clinimix E 5/15 at 60 ml/hr + lipids at 10 ml/hr- 72 gm of protein and 1584 kcals  -Vital 1.5 at 64mL/hr + Prostat 30mL TID (Goal rate is 35mL/hr with Prostat liquid 5mL PT TID. This will provide 134g protein and 2280 kcal)   Nutritional Goals:  2100-2300 kCal, 120-135 grams of protein per day per RD recommendations 2/9   Admit: 73 year old male with SBO and ischemic bowel s/p resection at [facility]. Was transferred to [facility] for continuing hemodialysis and inability to wean from ventilator. developed a new bowel perforation at his anastomosis site and is s/p ex lap (2/1) with small bowel resection and wound vac placement. Now with melena in ostomy- Angiogram showed no evidence of active bleed or AVM   GI: new bowel perforation s/p ex lap; was on TPN at [facility]. To OR 2/3 for small bowel anastomosis, colostomy, gastrostomy tube. (+)tagged RBC study, hence mesenteric angiogram 2/13 & no bleeding source found. Abd soft, ND.  Ostomy output doesn't appear melanotic.  PPI-IV   Nutrition: TF started 2/9- pt had  emesis when TF @ 20 ml/hr with Pro-Stat TID- tolerating with minimal residuals. Unable to advance as pt with frank melena which has required transfusion   Endo: CBGs 110-155, with increased insulin in TPN   Lytes: K 3.0, Na 144, Phos 3.5  Will give 60mEq per tube x 1.   Renal:  Hasn't had HD since 2/2, & only tolerated 1 hour at that time d/t tachycardia and decreased BP. Creat 2.61 UOP 2.2 ml/kg/hr. Fluid overload felt to be primarily d/t transfusions.  Off lasix   Hepatobil:  AlkPhos 244- improved this AM.  Palb 12.5, slightly decreased  Heme: Hemoptysis and melanotic stools. Hg 7.9, last transfusion 2/13. No source of bleeding found on angiogram; multiple transfusions. Aranesp 164mcg SQ qMon   ID: Abx at  [facility]: Flagyl 1/21>>2/1, Meropenem 1/24>>2/1  Zosyn 2/1>> 2/12 Vanc 2/9>>2/10  Linezolid 2/10>> 2/12  Bactrim IV 2/12 >>  2/9 trach aspirate: Stenotrophomonas maltophilia, s: SMX/TMP  WBC 12.2- continues to improve, Tmax 100.3  TPN Access: CVC   Plan:  - TF to continue at 34mL/hr today + Prostat 58mL TID  - Dec Clinimix 5/15 (no lytes) today to 50 ml/hr + 20% lipids at 10 ml/hr.  _KCl 40 mEq per tube X 1  - May need to alternate with lytes and without lytes  - Continue Insulin to 20 units per bag. Continue SSI q4h.  - TPN labs as ordered- BMET, Mg, Phos in AM  - Follow for ability to increase TF rate after GI bleeding resolved and wean off TPN   Excell Seltzer, [facility]

## 2013-09-03 NOTE — Progress Notes (Signed)
Patient ID: Jason Harmon, male   DOB: 01-23-1941, 73 y.o.   MRN: [id number] S:More awake and alert O:BP 157/87  Pulse 100  Temp(Src) 98.9 F (37.2 C) (Axillary)  Resp 29  Ht 5\' 11"  (1.803 m)  Wt 75.8 kg (167 lb 1.7 oz)  BMI 23.32 kg/m2  SpO2 92%  Intake/Output Summary (Last 24 hours) at 09/03/13 1028 Last data filed at 09/03/13 1000  Gross per 24 hour  Intake   2970 ml  Output   4250 ml  Net  -1280 ml   Intake/Output: I/O last 3 completed shifts: In: 5277 [I.V.:370; Other:330; NG/GT:810; IV OEUMPNTIR:443] Out: 1540 [Urine:5875; GQQPY:1950]  Intake/Output this shift:  Total I/O In: 390 [Other:90; NG/GT:90; TPN:210] Out: 525 [Urine:525] Weight change: -1.2 kg (-2 lb 10.3 oz) DTO:IZTIWPY WM wearing trach CVS:no rub Resp:cta KDX:IPJASN Ext:1+ pedal edema   Recent Labs Lab 08/28/13 0413 08/28/13 1450 08/29/13 0320 08/30/13 0405 08/31/13 0345 09/01/13 0500 09/02/13 0515 09/03/13 0430  NA 147 145 149* 145 145 146 140 144  K 2.9* 3.3* 3.4* 3.0* 3.2* 3.4* 3.1* 3.0*  CL 112 109 111 106 106 105 100 102  CO2 19 21 22 25 25 25 25 24   GLUCOSE 166* 140* 225* 197* 162* 132* 128* 138*  BUN 95* 95* 91* 86* 87* 89* 96* 96*  CREATININE 2.77* 2.68* 2.58* 2.54* 2.50* 2.53* 2.73* 2.61*  ALBUMIN 1.3* 1.5* 1.3* 1.3* 1.2* 1.5* 1.5*  --   CALCIUM 7.1* 7.3* 7.1* 7.5* 7.3* 7.6* 7.3* 7.4*  PHOS 3.5 3.8 3.8 4.4 4.8* 5.1* 4.3 3.5  AST  --   --  20  --   --  34 36  --   ALT  --   --  17  --   --  29 35  --    Liver Function Tests:  Recent Labs Lab 08/29/13 0320  08/31/13 0345 09/01/13 0500 09/02/13 0515  AST 20  --   --  34 36  ALT 17  --   --  29 35  ALKPHOS 79  --   --  262* 244*  BILITOT 1.5*  --   --  1.5* 1.2  PROT 4.2*  --   --  5.4* 5.1*  ALBUMIN 1.3*  < > 1.2* 1.5* 1.5*  < > = values in this interval not displayed. No results found for this basename: LIPASE, AMYLASE,  in the last 168 hours No results found for this basename: AMMONIA,  in the last 168  hours CBC:  Recent Labs Lab 08/30/13 1500 08/31/13 0345 08/31/13 1510 09/01/13 0500 09/02/13 0515 09/03/13 0850  WBC 24.6* 19.7* 20.3* 18.5* 12.2*  --   NEUTROABS  --   --   --   --  10.6*  --   HGB 7.9* 6.4* 9.3* 9.1* 7.9* 9.1*  HCT 21.9* 17.8* 26.3* 25.2* 22.4* 26.2*  MCV 89.0 87.7 89.5 87.8 90.0  --   PLT 123* 124* 124* 131* 120*  --    Cardiac Enzymes: No results found for this basename: CKTOTAL, CKMB, CKMBINDEX, TROPONINI,  in the last 168 hours CBG:  Recent Labs Lab 09/02/13 1545 09/02/13 1956 09/02/13 2356 09/03/13 0419 09/03/13 0810  GLUCAP 155* 110* 138* 138* 103*    Iron Studies: No results found for this basename: IRON, TIBC, TRANSFERRIN, FERRITIN,  in the last 72 hours Studies/Results: Dg Chest Port 1 View  09/03/2013   CLINICAL DATA:  Respiratory failure, followup  EXAM: PORTABLE CHEST - 1 VIEW  COMPARISON:  Portable exam  0604 hr compared to 09/01/2013  FINDINGS: Tracheostomy tube and left jugular line stable.  Normal heart size.  Severe diffuse bilateral airspace infiltrates unchanged.  Probable small right pleural effusion.  Skin folds project over left chest.  No pneumothorax.  IMPRESSION: Severe diffuse bilateral pulmonary infiltrates unchanged.   Electronically Signed   By: Lavonia Dana M.D.   On: 09/03/2013 08:02   . antiseptic oral rinse  15 mL Mouth Rinse QID  . chlorhexidine  15 mL Mouth Rinse BID  . darbepoetin (ARANESP) injection - NON-DIALYSIS  100 mcg Subcutaneous Q Mon-1800  . feeding supplement (PRO-STAT SUGAR FREE 64)  30 mL Per Tube TID  . insulin aspart  0-15 Units Subcutaneous 6 times per day  . metoprolol  5 mg Intravenous Q4H  . pantoprazole (PROTONIX) IV  40 mg Intravenous Q24H  . sulfamethoxazole-trimethoprim  400 mg of trimethoprim Intravenous Q12H    BMET    Component Value Date/Time   NA 144 09/03/2013 0430   K 3.0* 09/03/2013 0430   CL 102 09/03/2013 0430   CO2 24 09/03/2013 0430   GLUCOSE 138* 09/03/2013 0430   BUN 96*  09/03/2013 0430   CREATININE 2.61* 09/03/2013 0430   CALCIUM 7.4* 09/03/2013 0430   GFRNONAA 23* 09/03/2013 0430   GFRAA 27* 09/03/2013 0430   CBC    Component Value Date/Time   WBC 12.2* 09/02/2013 0515   RBC 2.49* 09/02/2013 0515   HGB 9.1* 09/03/2013 0850   HCT 26.2* 09/03/2013 0850   PLT 120* 09/02/2013 0515   MCV 90.0 09/02/2013 0515   MCH 31.7 09/02/2013 0515   MCHC 35.3 09/02/2013 0515   RDW 15.7* 09/02/2013 0515   LYMPHSABS 0.5* 09/02/2013 0515   MONOABS 1.0 09/02/2013 0515   EOSABS 0.1 09/02/2013 0515   BASOSABS 0.0 09/02/2013 0515     Assessment/Plan:  73M with dialysis dependent prolonged AKI- but no HD since 2/2, with chronic ventilatory failure, small bowel perforation (repaired 08/20/13), abdominal septic shock  1. Non-oliguric AKI due to ischemic ATN with slow recovery but is no longer dialysis dependent. Recent bump in BUN/Cr, slight improvement with lowering diuretics.  1. Continue to hold lasix and follow given his marked UOP and likely prerenal azotemia from diuresis and GI Blood loss 2. Would not offer further HD as this would not fix the underlying issue and would be futile care.  3. His trach will necessitate LTAC placement and would strongly recommend palliative care consult to help set goals/limits of care 4. Watch sulfamethoxazole dose in advanced CKD 2. SIRS from small bowel perforation- followed by CCS  3. Hypokalemia- replete gently given worsening renal function 4. ABLA- transfused regularly 5. VDRF s/p trach- per PCCM 6. Dispo- poor overall prognosis. Recommend palliative care consult to help set goals/limits of care 7.   [place] [facility]

## 2013-09-03 NOTE — Progress Notes (Signed)
Chaplain responded to spiritual care consult, visiting with patient, patient's wife, and patient's daughter-in-law. Patient was trached, but awake. Patient's family said they were very pleased to see a chaplain. Family is from [place]. They said patient is a hard worker who rarely went to the doctor and worked up until this illness. They expressed optimism and hope that patient will progress and recover, but also acknowledged the patient's current health challenges of pneumonia and bleeding in the stomach. They have strong support system from family and church. Chaplain provided emotional and spiritual support, prayer, empathic listening, and caring presence.   [facility]: [phone]

## 2013-09-03 NOTE — Progress Notes (Signed)
Pt seen for trach consult at this time. Pt remains on vent full support. Will continue to follow for pt progression.

## 2013-09-03 NOTE — Progress Notes (Signed)
Pt seen and examined  abd soft, nd. g tube ok.  Minimal residuals hgb ok  Ok with adv TF to goal Daily cbc  Leighton Ruff. Redmond Pulling, MD, FACS General, Bariatric, & Minimally Invasive Surgery [facility] Surgery, [place]

## 2013-09-03 NOTE — Progress Notes (Signed)
PULMONARY  / CRITICAL CARE MEDICINE  Name: Jason Harmon MRN: [id number] DOB: 1940-10-04    ADMISSION DATE:  08/18/2013 LOS 16 days   CHIEF COMPLAINT:  Bowel perforation  BRIEF PATIENT DESCRIPTION:  73 yo male with SBO/ischemic bowel s/p resection with subsequent complicated medical course including respiratory failure and inability to wean from ventilator,[facility] with bowel perforation at anastomosis site.  SIGNIFICANT EVENTS: 1/22 Tracheostomy at outside [place] 2/01 Transfer from [facility], to ER, to [facility] ICU.  CCS consulted 2/1 Exploratory laparotomy with segmental resection of small bowel anastomosis and sigmoid colon with placement of abdominal wound vac 2/02 Off pressors, did not tolerate HD 2/4 small bowel anastomosis with gastrostomy & End sigmoid colostomy 2/8 ATC x 24h 2/9 Hemoptysis, anemia >> PRBC transfusion; CXR with new infiltrate 2/10 Red rash from vancomycin, increased WOB 2/12 2 units PRBC transfusion 2/13 Transfuse PRBC 2/13 GI Nuclear scan -: Active GI bleeding from a loop of small bowel to the right of midline at or near the level of the pelvic brim, likely corresponding to the enteroenteric anastomosis   2/13 IR angiogram-no evidence of active bleed or AVM .  2/14 Still has sputum with blood, but less.  Continues to have melena and needing PRBC's.Unable to tolerate trach collar d/t increased wob  2/16 - 2/17 Unable to tolerate attempts @ PSV   STUDIES:  CT A/P with contrast 1/31:  bowel perforation, likely anastomotic breakdown at proximal enteroenterostomy with pneumoperitoneum and spilling of oral contrast, moderate ascites with peritonitis CT chest 2/12: b/l GGO, Lt > Rt pleural effusions  LINES / TUBES: Tracheostomy 1/23 >>  L IJ HD catheter 1/21 >>   CULTURES: Sputum 2/9 >> Moderate Stenotrophomonas maltophilia  ANTIBIOTICS: Zosyn 2/1 >> 2/12 Vancomycin 2/9 >> 2/10 Linezolid 2/10 >> 2/12 Bactrim 2/12 >>   SUBJECTIVE: No new  complaints. No distress on PSV. RASS 0 to +1   PHYSICAL EXAM VITAL SIGNS: Temp:  [98.8 F (37.1 C)-100.3 F (37.9 C)] 99.5 F (37.5 C) (02/17 1222) Pulse Rate:  [93-114] 101 (02/17 1400) Resp:  [19-33] 30 (02/17 1400) BP: (116-157)/(72-90) 144/85 mmHg (02/17 1400) SpO2:  [90 %-100 %] 92 % (02/17 1400) FiO2 (%):  [40 %-50 %] 40 % (02/17 1200) Weight:  [75.8 kg (167 lb 1.7 oz)] 75.8 kg (167 lb 1.7 oz) (02/17 0500) VENT SETTINGS: Vent Mode:  [-] PRVC FiO2 (%):  [40 %-50 %] 40 % Set Rate:  [16 bmp] 16 bmp Vt Set:  [600 mL] 600 mL PEEP:  [5 cmH20] 5 cmH20 Pressure Support:  [10 cmH20] 10 cmH20 Plateau Pressure:  [23 cmH20-33 cmH20] 33 cmH20 INTAKE / OUTPUT: Intake/Output     02/16 0701 - 02/17 0700 02/17 0701 - 02/18 0700   I.V. (mL/kg) 100 (1.3)    Other 180 90   NG/GT 600 200   IV Piggyback 500 500   TPN 1670 490   Total Intake(mL/kg) 3050 (40.2) 1280 (16.9)   Urine (mL/kg/hr) 3950 (2.2) 1000 (1.7)   Stool 850 (0.5)    Total Output 4800 1000   Net -1750 +280          PHYSICAL EXAMINATION: General: NAD Neuro: No focal deficits HEENT:  Trach site with mild bloody secretions Cardiovascular:  Tachycardic, regular.  No M/R/G. Lungs: scant  rhonchi, no wheeze Abdomen:  Open abd wounds packed, abd mildly firm, minimally tender, diminished BS Ext: 2-3+ symmetric edema   LABS: I have reviewed all of today's lab results. Relevant abnormalities are discussed in  the A/P section  CXR: edema/ARDS pattern  ASSESSMENT / PLAN:  PULMONARY A: Prolonged vent dependence post op.   S/p Tracheostomy 1/22. Hemoptysis, improved ARDS vs edema P: Cont vent wean as tolerated  CARDIOVASCULAR A: Septic shock, resolved Hx of A fib >> previously on amiodarone. P: Continue current rx  RENAL A: Acute kidney injury, improving Anion gap metabolic acidosis, resolved Hypokalemia P: Monitor BMET intermittently Monitor I/Os Correct electrolytes as  indicated   GASTROINTESTINAL A: Post laparotomy Protein calorie malnutrition Recurrent GI bleed - suspect from anastamosis P: -post-op care per CCS -advance diet per CCS -protonix for SUP -Per Dr Redmond Pulling, not a candidate for repeat surgery  HEMATOLOGIC A: Anemia of critical illness acute blood loss anemia P: -SCD for DVT prevention -f/u H/H after transfusion -transfuse for Hb < 7   INFECTIOUS A: Severe sepsis, resolved HCAP P: Micro and abx as above  ENDOCRINE A: Hyperglycemia. P: Cont SSI  NEUROLOGIC A: Acute encephalopathy, improving Deconditioning. P: Palliative care consult requested 2/16 Cont PT/OT Plan LTACH    The patient is critically ill with multiple organ systems failure and requires high complexity decision making for assessment and support, frequent evaluation and titration of therapies, application of advanced monitoring technologies and extensive interpretation of multiple databases.   Critical Care Time devoted to patient care services described in this note is  35  Minutes.  Family updated @ bedside 2/17  Merton Border, MD ; [facility] service Mobile [phone].  After 5:30 PM or weekends, call [phone]  09/03/2013 2:50 PM

## 2013-09-03 NOTE — Progress Notes (Signed)
[facility] ADULT ICU REPLACEMENT PROTOCOL FOR AM LAB REPLACEMENT ONLY  The patient does not apply for the [facility] Adult ICU Electrolyte Replacment Protocol based on the criteria listed below:   1. Is GFR >/= 40 ml/min? no  Patient's GFR today is  2. Is urine output >/= 0.5 ml/kg/hr for the last 6 hours? no Patient's UOP is  ml/kg/hr 3. Is BUN < 60 mg/dL? no  Patient's BUN today is 96 4. Abnormal electrolyte(s): K 3.1 5. Ordered repletion with: NA 6. If a panic level lab has been reported, has the CCM MD in charge been notified? no.   Physician:    Ronda Fairly A 09/03/2013 6:41 AM

## 2013-09-03 NOTE — Progress Notes (Signed)
Patient ID: Jason Harmon, male   DOB: [birth year], 73 y.o.   MRN: [id number]  Subjective: Vent per trach.  tachycardic and tachypnic.  Renal function slightly improved today.    Objective:  Vital signs:  Filed Vitals:   09/03/13 0516 09/03/13 0600 09/03/13 0700 09/03/13 0812  BP: 148/82 141/87 138/84   Pulse: 104 109 96   Temp:    98.9 F (37.2 C)  TempSrc:    Axillary  Resp: $Remo'28 23 31   'QZxxL$ Height:      Weight:      SpO2:  90% 91%     Last BM Date: 09/03/13 (colostomy)  Intake/Output   Yesterday:  02/16 0701 - 02/17 0700 In: 3050 [I.V.:100; NG/GT:600; IV Piggyback:500; TPN:1670] Out: 4800 [Urine:3950; Stool:850] This shift:    I/O last 3 completed shifts: In: 9169 [I.V.:370; Other:330; NG/GT:810; IV IHWTUUEKC:003] Out: 4917 [Urine:5875; HXTAV:6979]   Physical Exam:  General: lethargic  Abdomen: Soft. Nondistended. Midline wound is c/d/i. Ostomy with liquid yellow stool, does not appear melanotic. Stoma is pink and viable.     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    Results:   Labs: Results for orders placed during the hospital encounter of 08/18/13 (from the past 48 hour(s))  GLUCOSE, CAPILLARY     Status: Abnormal   Collection Time    09/01/13 11:35 AM      Result Value Ref Range   Glucose-Capillary 122 (*) 70 - 99 mg/dL  GLUCOSE, CAPILLARY     Status: Abnormal   Collection Time    09/01/13  4:26 PM      Result Value Ref Range   Glucose-Capillary 140 (*) 70 - 99 mg/dL  GLUCOSE, CAPILLARY     Status: Abnormal   Collection Time    09/01/13  7:36 PM      Result Value Ref Range   Glucose-Capillary 108 (*) 70 - 99 mg/dL  GLUCOSE, CAPILLARY     Status: Abnormal   Collection Time    09/01/13 11:44 PM      Result Value Ref Range   Glucose-Capillary 110 (*) 70 - 99 mg/dL  GLUCOSE, CAPILLARY     Status:  Abnormal   Collection Time    09/02/13  3:45 AM      Result Value Ref Range   Glucose-Capillary 153 (*) 70 - 99 mg/dL  COMPREHENSIVE METABOLIC PANEL     Status: Abnormal   Collection Time    09/02/13  5:15 AM      Result Value Ref Range   Sodium 140  137 - 147 mEq/L   Potassium 3.1 (*) 3.7 - 5.3 mEq/L   Chloride 100  96 - 112 mEq/L   CO2 25  19 - 32 mEq/L   Glucose, Bld 128 (*) 70 - 99 mg/dL   BUN 96 (*) 6 - 23 mg/dL   Creatinine, Ser 2.73 (*) 0.50 - 1.35 mg/dL   Calcium 7.3 (*) 8.4 - 10.5 mg/dL   Total Protein 5.1 (*) 6.0 - 8.3 g/dL   Albumin 1.5 (*) 3.5 - 5.2 g/dL   AST 36  0 - 37 U/L   ALT 35  0 - 53 U/L   Alkaline Phosphatase 244 (*) 39 - 117 U/L   Total Bilirubin 1.2  0.3 - 1.2 mg/dL   GFR calc non Af Amer 22 (*) >90 mL/min  GFR calc Af Amer 26 (*) >90 mL/min   Comment: (NOTE)     The eGFR has been calculated using the CKD EPI equation.     This calculation has not been validated in all clinical situations.     eGFR's persistently <90 mL/min signify possible Chronic Kidney     Disease.  MAGNESIUM     Status: None   Collection Time    09/02/13  5:15 AM      Result Value Ref Range   Magnesium 1.8  1.5 - 2.5 mg/dL  PHOSPHORUS     Status: None   Collection Time    09/02/13  5:15 AM      Result Value Ref Range   Phosphorus 4.3  2.3 - 4.6 mg/dL  CBC     Status: Abnormal   Collection Time    09/02/13  5:15 AM      Result Value Ref Range   WBC 12.2 (*) 4.0 - 10.5 K/uL   RBC 2.49 (*) 4.22 - 5.81 MIL/uL   Hemoglobin 7.9 (*) 13.0 - 17.0 g/dL   HCT 22.4 (*) 39.0 - 52.0 %   MCV 90.0  78.0 - 100.0 fL   MCH 31.7  26.0 - 34.0 pg   MCHC 35.3  30.0 - 36.0 g/dL   RDW 15.7 (*) 11.5 - 15.5 %   Platelets 120 (*) 150 - 400 K/uL  DIFFERENTIAL     Status: Abnormal   Collection Time    09/02/13  5:15 AM      Result Value Ref Range   Neutrophils Relative % 87 (*) 43 - 77 %   Lymphocytes Relative 4 (*) 12 - 46 %   Monocytes Relative 8  3 - 12 %   Eosinophils Relative 1  0 - 5 %    Basophils Relative 0  0 - 1 %   Neutro Abs 10.6 (*) 1.7 - 7.7 K/uL   Lymphs Abs 0.5 (*) 0.7 - 4.0 K/uL   Monocytes Absolute 1.0  0.1 - 1.0 K/uL   Eosinophils Absolute 0.1  0.0 - 0.7 K/uL   Basophils Absolute 0.0  0.0 - 0.1 K/uL   RBC Morphology POLYCHROMASIA PRESENT     Comment: TARGET CELLS   WBC Morphology ATYPICAL LYMPHOCYTES    PREALBUMIN     Status: Abnormal   Collection Time    09/02/13  5:15 AM      Result Value Ref Range   Prealbumin 12.5 (*) 17.0 - 34.0 mg/dL   Comment: Performed at [facility]     Status: None   Collection Time    09/02/13  5:15 AM      Result Value Ref Range   Triglycerides 123  <150 mg/dL  GLUCOSE, CAPILLARY     Status: Abnormal   Collection Time    09/02/13  7:33 AM      Result Value Ref Range   Glucose-Capillary 112 (*) 70 - 99 mg/dL   Comment 1 Notify RN    GLUCOSE, CAPILLARY     Status: Abnormal   Collection Time    09/02/13 12:03 PM      Result Value Ref Range   Glucose-Capillary 134 (*) 70 - 99 mg/dL   Comment 1 Documented in Chart     Comment 2 Notify RN    GLUCOSE, CAPILLARY     Status: Abnormal   Collection Time    09/02/13  3:45 PM      Result Value Ref Range  Glucose-Capillary 155 (*) 70 - 99 mg/dL   Comment 1 Documented in Chart     Comment 2 Notify RN    GLUCOSE, CAPILLARY     Status: Abnormal   Collection Time    09/02/13  7:56 PM      Result Value Ref Range   Glucose-Capillary 110 (*) 70 - 99 mg/dL   Comment 1 Documented in Chart     Comment 2 Notify RN    GLUCOSE, CAPILLARY     Status: Abnormal   Collection Time    09/02/13 11:56 PM      Result Value Ref Range   Glucose-Capillary 138 (*) 70 - 99 mg/dL  GLUCOSE, CAPILLARY     Status: Abnormal   Collection Time    09/03/13  4:19 AM      Result Value Ref Range   Glucose-Capillary 138 (*) 70 - 99 mg/dL   Comment 1 Documented in Chart     Comment 2 Notify RN    BASIC METABOLIC PANEL     Status: Abnormal   Collection Time    09/03/13  4:30  AM      Result Value Ref Range   Sodium 144  137 - 147 mEq/L   Potassium 3.0 (*) 3.7 - 5.3 mEq/L   Chloride 102  96 - 112 mEq/L   CO2 24  19 - 32 mEq/L   Glucose, Bld 138 (*) 70 - 99 mg/dL   BUN 96 (*) 6 - 23 mg/dL   Creatinine, Ser 2.61 (*) 0.50 - 1.35 mg/dL   Calcium 7.4 (*) 8.4 - 10.5 mg/dL   GFR calc non Af Amer 23 (*) >90 mL/min   GFR calc Af Amer 27 (*) >90 mL/min   Comment: (NOTE)     The eGFR has been calculated using the CKD EPI equation.     This calculation has not been validated in all clinical situations.     eGFR's persistently <90 mL/min signify possible Chronic Kidney     Disease.  PHOSPHORUS     Status: None   Collection Time    09/03/13  4:30 AM      Result Value Ref Range   Phosphorus 3.5  2.3 - 4.6 mg/dL    Imaging / Studies: Dg Chest Port 1 View  09/03/2013   CLINICAL DATA:  Respiratory failure, followup  EXAM: PORTABLE CHEST - 1 VIEW  COMPARISON:  Portable exam 0604 hr compared to 09/01/2013  FINDINGS: Tracheostomy tube and left jugular line stable.  Normal heart size.  Severe diffuse bilateral airspace infiltrates unchanged.  Probable small right pleural effusion.  Skin folds project over left chest.  No pneumothorax.  IMPRESSION: Severe diffuse bilateral pulmonary infiltrates unchanged.   Electronically Signed   By: Lavonia Dana M.D.   On: 09/03/2013 08:02    Medications / Allergies: per chart  Antibiotics: Anti-infectives   Start     Dose/Rate Route Frequency Ordered Stop   08/29/13 1200  sulfamethoxazole-trimethoprim (BACTRIM) 400 mg of trimethoprim in dextrose 5 % 500 mL IVPB     400 mg of trimethoprim 350 mL/hr over 90 Minutes Intravenous Every 12 hours 08/29/13 1149     08/28/13 1900  vancomycin (VANCOCIN) 1,250 mg in sodium chloride 0.9 % 250 mL IVPB  Status:  Discontinued     1,250 mg 166.7 mL/hr over 90 Minutes Intravenous Every 48 hours 08/26/13 1758 08/27/13 0900   08/27/13 0930  linezolid (ZYVOX) IVPB 600 mg  Status:  Discontinued  600  mg 300 mL/hr over 60 Minutes Intravenous Every 12 hours 08/27/13 0900 08/29/13 1057   08/26/13 1900  vancomycin (VANCOCIN) 1,750 mg in sodium chloride 0.9 % 500 mL IVPB     1,750 mg 250 mL/hr over 120 Minutes Intravenous  Once 08/26/13 1758 08/26/13 2044   08/18/13 1400  piperacillin-tazobactam (ZOSYN) IVPB 2.25 g  Status:  Discontinued     2.25 g 100 mL/hr over 30 Minutes Intravenous 3 times per day 08/18/13 1124 08/29/13 1057   08/18/13 0500  piperacillin-tazobactam (ZOSYN) IVPB 3.375 g  Status:  Discontinued     3.375 g 12.5 mL/hr over 240 Minutes Intravenous 3 times per day 08/18/13 0434 08/18/13 1124      Assessment/Plan:  1.Pod 16/14 reexploration with small bowel anastmosis, colostomy and g tube  2. VDRF  3. Anemia, ABL secondary to melena from GI tract and trach  4. PCM/TNA/TF   obtain an h&h  Increase TF to  49ml/hr He is not a surgical candidate.  If hgb drops significantly, repeat tragged RBC scan and repeat IR angio. Await palliative care consult   Erby Pian, [facility] Surgery Pager [phone] Office [phone]  09/03/2013 8:32 AM

## 2013-09-03 NOTE — Consult Note (Signed)
Patient Jason:MVEHMCN [place]      DOB: 01-04-1941      OBS:[id number]     Consult Note from the Palliative Medicine Team at [facility] Requested by: Dr. Alva Garnet     PCP: Pcp Not In System Reason for Consultation: [place] and options.    Phone Number:None  Assessment of patients Current state: Jason Harmon is a 73 yo male who is ventilator dependent with tracheostomy from complications with SBO/ischemic bowel and perforation at anastomosis site with recent bleeding from this site. 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 He was at [place] before transferring into [facility] ED and ICU. His family tells me that he had never been to see a doctor prior to this event - and that he is very fearful of the hospital. I discussed that we should consider the "what if" of his care if Jason Harmon health declines instead of improves. His family will not consider the possiiblity that he will not improve and tell me that "we only think positive from here on out." They tell me that they know in their hearts that he will be fine. They explain many scenarios and share many stories of friends/family that were told they had poor prognosis and are now in great health years later. I did address code status and they tell me that he would definitely want to have CPR and that he "deserves to have that chance." He will remain a full code with continued aggressive care. I will continue to follow and support Jason Harmon and help his family if he declines.    Goals of Care: 1.  Code Status: FULL   2. Scope of Treatment: Full aggressive treatment is desired by his family.    4. Disposition: To be determined on outcomes.    3. Symptom Management:   1. Anxiety/Agitation: Fentanyl and Versed prn.  2. Pain: Fentanyl prn.  3. Weakness: Continue medical management. Consider PT  when medically able to participate.   4. Psychosocial: Emotional support provided to patient and family.   5. Spiritual: Spiritual care consult offered and family appreciatively accepted.    Brief HPI: 73 yo male ventilator dependent.   ROS: Unable to elicit - pt would not respond to questions but only looks at me.   PMH:  Past Medical History  Diagnosis Date  . Renal disorder   . A-fib   . Sepsis   . Ischemic bowel disease   . Acute renal failure      PSH: Past Surgical History  Procedure Laterality Date  . Abdominal surgery    . Laparotomy N/A 08/18/2013    Procedure: EXPLORATORY LAPAROTOMY;  Surgeon: Jason Faster. Cornett, MD;  Location: [place];  Service: General;  Laterality: N/A;  . Laparotomy N/A 08/20/2013    Procedure: EXPLORATORY LAPAROTOMY WITH  SMALL BOWEL RESECTION;  Surgeon: Jason Bookbinder, MD;  Location: [place];  Service: General;  Laterality: N/A;  . Gastrostomy N/A 08/20/2013    Procedure: G-TUBE PLACEMENT;  Surgeon: Jason Bookbinder, MD;  Location: [place];  Service: General;  Laterality: N/A;  . Colostomy N/A 08/20/2013    Procedure: COLOSTOMY;  Surgeon: Jason Bookbinder, MD;  Location: [place];  Service: General;  Laterality: N/A;   I have reviewed the [place] and SH and  If appropriate update it with new information. Jason Known Allergies Scheduled Meds: .  antiseptic oral rinse  15 mL Mouth Rinse QID  . chlorhexidine  15 mL Mouth Rinse BID  . darbepoetin (ARANESP) injection - NON-DIALYSIS  100 mcg Subcutaneous Q Mon-1800  . feeding supplement (PRO-STAT SUGAR FREE 64)  30 mL Per Tube TID  . insulin aspart  0-15 Units Subcutaneous 6 times per day  . metoprolol  5 mg Intravenous Q4H  . pantoprazole (PROTONIX) IV  40 mg Intravenous Q24H  . sulfamethoxazole-trimethoprim  400 mg of trimethoprim Intravenous Q12H   Continuous Infusions: . TPN (CLINIMIX) Adult without lytes 60 mL/hr at 09/02/13 1842   And  . fat emulsion 240 mL (09/02/13 1842)  . TPN (CLINIMIX) Adult  without lytes     And  . fat emulsion    . feeding supplement (VITAL 1.5 CAL) 1,000 mL (09/02/13 1658)   PRN Meds:.albuterol, fentaNYL, midazolam, sodium chloride    BP 157/88  Pulse 107  Temp(Src) 98.9 F (37.2 C) (Axillary)  Resp 24  Ht $R'5\' 11"'vA$  (1.803 m)  Wt 75.8 kg (167 lb 1.7 oz)  BMI 23.32 kg/m2  SpO2 90%   PPS: 30%   Intake/Output Summary (Last 24 hours) at 09/03/13 1135 Last data filed at 09/03/13 1100  Gross per 24 hour  Intake   2960 ml  Output   4050 ml  Net  -1090 ml   LBM: 09/03/13                         Physical Exam:  General: NAD, ill appearing HEENT: Jason Harmon/AT, tracheostomy with bloody secretions Chest: Rhonchi heard throughout CVS: Tachycardic - ST Abdomen: Wounds dressed, slightly distended and firm, tender, hypoactive BS Ext: BLE 3+ edema, warm to touch Neuro: Alert, eyes open, + tracking, not following commands (although he does look at me and smile and wink when I complimented his family)  Labs: CBC    Component Value Date/Time   WBC 12.2* 09/02/2013 0515   RBC 2.49* 09/02/2013 0515   HGB 9.1* 09/03/2013 0850   HCT 26.2* 09/03/2013 0850   PLT 120* 09/02/2013 0515   MCV 90.0 09/02/2013 0515   MCH 31.7 09/02/2013 0515   MCHC 35.3 09/02/2013 0515   RDW 15.7* 09/02/2013 0515   LYMPHSABS 0.5* 09/02/2013 0515   MONOABS 1.0 09/02/2013 0515   EOSABS 0.1 09/02/2013 0515   BASOSABS 0.0 09/02/2013 0515    BMET    Component Value Date/Time   NA 144 09/03/2013 0430   K 3.0* 09/03/2013 0430   CL 102 09/03/2013 0430   CO2 24 09/03/2013 0430   GLUCOSE 138* 09/03/2013 0430   BUN 96* 09/03/2013 0430   CREATININE 2.61* 09/03/2013 0430   CALCIUM 7.4* 09/03/2013 0430   GFRNONAA 23* 09/03/2013 0430   GFRAA 27* 09/03/2013 0430    CMP     Component Value Date/Time   NA 144 09/03/2013 0430   K 3.0* 09/03/2013 0430   CL 102 09/03/2013 0430   CO2 24 09/03/2013 0430   GLUCOSE 138* 09/03/2013 0430   BUN 96* 09/03/2013 0430   CREATININE 2.61* 09/03/2013 0430   CALCIUM 7.4*  09/03/2013 0430   PROT 5.1* 09/02/2013 0515   ALBUMIN 1.5* 09/02/2013 0515   AST 36 09/02/2013 0515   ALT 35 09/02/2013 0515   ALKPHOS 244* 09/02/2013 0515   BILITOT 1.2 09/02/2013 0515   GFRNONAA 23* 09/03/2013 0430   GFRAA 27* 09/03/2013 0430     Time In Time Out Total Time Spent with Patient Total Overall  Time  1030 1145 23min 82min    Greater than 50%  of this time was spent counseling and coordinating care related to the above assessment and plan.  Vinie Sill, NP Palliative Medicine Team Pager # [phone] Team Phone # [phone]

## 2013-09-04 ENCOUNTER — Inpatient Hospital Stay
Admission: AD | Admit: 2013-09-04 | Discharge: 2013-10-23 | Disposition: A | Discharge status: Skilled Nursing Facility | Payer: Self-pay | Source: Ambulatory Visit | Attending: Internal Medicine | Admitting: Internal Medicine

## 2013-09-04 DIAGNOSIS — K5733 Diverticulitis of large intestine without perforation or abscess with bleeding: Secondary | ICD-10-CM | POA: Diagnosis not present

## 2013-09-04 DIAGNOSIS — R918 Other nonspecific abnormal finding of lung field: Secondary | ICD-10-CM | POA: Diagnosis not present

## 2013-09-04 DIAGNOSIS — Z430 Encounter for attention to tracheostomy: Secondary | ICD-10-CM | POA: Diagnosis not present

## 2013-09-04 DIAGNOSIS — N400 Enlarged prostate without lower urinary tract symptoms: Secondary | ICD-10-CM | POA: Diagnosis not present

## 2013-09-04 DIAGNOSIS — K55029 Acute infarction of small intestine, extent unspecified: Secondary | ICD-10-CM | POA: Diagnosis present

## 2013-09-04 DIAGNOSIS — G9341 Metabolic encephalopathy: Secondary | ICD-10-CM | POA: Diagnosis not present

## 2013-09-04 DIAGNOSIS — N186 End stage renal disease: Secondary | ICD-10-CM | POA: Diagnosis not present

## 2013-09-04 DIAGNOSIS — A419 Sepsis, unspecified organism: Secondary | ICD-10-CM | POA: Diagnosis not present

## 2013-09-04 DIAGNOSIS — R6520 Severe sepsis without septic shock: Secondary | ICD-10-CM | POA: Diagnosis not present

## 2013-09-04 DIAGNOSIS — I480 Paroxysmal atrial fibrillation: Secondary | ICD-10-CM | POA: Diagnosis present

## 2013-09-04 DIAGNOSIS — Z431 Encounter for attention to gastrostomy: Secondary | ICD-10-CM | POA: Diagnosis not present

## 2013-09-04 DIAGNOSIS — E872 Acidosis, unspecified: Secondary | ICD-10-CM | POA: Diagnosis not present

## 2013-09-04 DIAGNOSIS — I4891 Unspecified atrial fibrillation: Secondary | ICD-10-CM | POA: Diagnosis present

## 2013-09-04 DIAGNOSIS — D631 Anemia in chronic kidney disease: Secondary | ICD-10-CM | POA: Diagnosis not present

## 2013-09-04 DIAGNOSIS — M6281 Muscle weakness (generalized): Secondary | ICD-10-CM | POA: Diagnosis not present

## 2013-09-04 DIAGNOSIS — J90 Pleural effusion, not elsewhere classified: Secondary | ICD-10-CM

## 2013-09-04 DIAGNOSIS — I10 Essential (primary) hypertension: Secondary | ICD-10-CM | POA: Diagnosis not present

## 2013-09-04 DIAGNOSIS — J9600 Acute respiratory failure, unspecified whether with hypoxia or hypercapnia: Secondary | ICD-10-CM

## 2013-09-04 DIAGNOSIS — R042 Hemoptysis: Secondary | ICD-10-CM | POA: Diagnosis present

## 2013-09-04 DIAGNOSIS — K559 Vascular disorder of intestine, unspecified: Secondary | ICD-10-CM | POA: Diagnosis not present

## 2013-09-04 DIAGNOSIS — Z930 Tracheostomy status: Secondary | ICD-10-CM | POA: Diagnosis not present

## 2013-09-04 DIAGNOSIS — E875 Hyperkalemia: Secondary | ICD-10-CM | POA: Diagnosis not present

## 2013-09-04 DIAGNOSIS — Z1624 Resistance to multiple antibiotics: Secondary | ICD-10-CM

## 2013-09-04 DIAGNOSIS — R739 Hyperglycemia, unspecified: Secondary | ICD-10-CM | POA: Diagnosis present

## 2013-09-04 DIAGNOSIS — N189 Chronic kidney disease, unspecified: Secondary | ICD-10-CM | POA: Diagnosis not present

## 2013-09-04 DIAGNOSIS — R609 Edema, unspecified: Secondary | ICD-10-CM | POA: Diagnosis not present

## 2013-09-04 DIAGNOSIS — K631 Perforation of intestine (nontraumatic): Secondary | ICD-10-CM

## 2013-09-04 DIAGNOSIS — J811 Chronic pulmonary edema: Secondary | ICD-10-CM | POA: Diagnosis not present

## 2013-09-04 DIAGNOSIS — G9340 Encephalopathy, unspecified: Secondary | ICD-10-CM | POA: Diagnosis not present

## 2013-09-04 DIAGNOSIS — F411 Generalized anxiety disorder: Secondary | ICD-10-CM | POA: Diagnosis present

## 2013-09-04 DIAGNOSIS — K659 Peritonitis, unspecified: Secondary | ICD-10-CM | POA: Diagnosis not present

## 2013-09-04 DIAGNOSIS — B9689 Other specified bacterial agents as the cause of diseases classified elsewhere: Secondary | ICD-10-CM | POA: Diagnosis not present

## 2013-09-04 DIAGNOSIS — E46 Unspecified protein-calorie malnutrition: Secondary | ICD-10-CM | POA: Diagnosis not present

## 2013-09-04 DIAGNOSIS — J988 Other specified respiratory disorders: Secondary | ICD-10-CM | POA: Diagnosis not present

## 2013-09-04 DIAGNOSIS — N179 Acute kidney failure, unspecified: Secondary | ICD-10-CM

## 2013-09-04 DIAGNOSIS — Z452 Encounter for adjustment and management of vascular access device: Secondary | ICD-10-CM | POA: Diagnosis not present

## 2013-09-04 DIAGNOSIS — J156 Pneumonia due to other aerobic Gram-negative bacteria: Secondary | ICD-10-CM | POA: Diagnosis present

## 2013-09-04 DIAGNOSIS — R404 Transient alteration of awareness: Secondary | ICD-10-CM | POA: Diagnosis not present

## 2013-09-04 DIAGNOSIS — D62 Acute posthemorrhagic anemia: Secondary | ICD-10-CM

## 2013-09-04 DIAGNOSIS — J9620 Acute and chronic respiratory failure, unspecified whether with hypoxia or hypercapnia: Secondary | ICD-10-CM | POA: Diagnosis not present

## 2013-09-04 DIAGNOSIS — N039 Chronic nephritic syndrome with unspecified morphologic changes: Secondary | ICD-10-CM | POA: Diagnosis not present

## 2013-09-04 DIAGNOSIS — A498 Other bacterial infections of unspecified site: Secondary | ICD-10-CM

## 2013-09-04 DIAGNOSIS — J984 Other disorders of lung: Secondary | ICD-10-CM | POA: Diagnosis not present

## 2013-09-04 DIAGNOSIS — J95851 Ventilator associated pneumonia: Secondary | ICD-10-CM | POA: Diagnosis present

## 2013-09-04 DIAGNOSIS — N390 Urinary tract infection, site not specified: Secondary | ICD-10-CM | POA: Diagnosis not present

## 2013-09-04 DIAGNOSIS — E41 Nutritional marasmus: Secondary | ICD-10-CM | POA: Diagnosis present

## 2013-09-04 DIAGNOSIS — J189 Pneumonia, unspecified organism: Secondary | ICD-10-CM | POA: Diagnosis not present

## 2013-09-04 DIAGNOSIS — Z9911 Dependence on respirator [ventilator] status: Secondary | ICD-10-CM | POA: Diagnosis not present

## 2013-09-04 DIAGNOSIS — R1310 Dysphagia, unspecified: Secondary | ICD-10-CM | POA: Diagnosis not present

## 2013-09-04 DIAGNOSIS — Z4682 Encounter for fitting and adjustment of non-vascular catheter: Secondary | ICD-10-CM | POA: Diagnosis not present

## 2013-09-04 DIAGNOSIS — E870 Hyperosmolality and hypernatremia: Secondary | ICD-10-CM | POA: Diagnosis not present

## 2013-09-04 LAB — GLUCOSE, CAPILLARY
GLUCOSE-CAPILLARY: 117 mg/dL — AB (ref 70–99)
Glucose-Capillary: 135 mg/dL — ABNORMAL HIGH (ref 70–99)
Glucose-Capillary: 145 mg/dL — ABNORMAL HIGH (ref 70–99)
Glucose-Capillary: 155 mg/dL — ABNORMAL HIGH (ref 70–99)

## 2013-09-04 LAB — BLOOD GAS, ARTERIAL
ACID-BASE DEFICIT: 4.2 mmol/L — AB (ref 0.0–2.0)
BICARBONATE: 20.8 meq/L (ref 20.0–24.0)
FIO2: 0.5 %
MECHVT: 600 mL
O2 Saturation: 97.5 %
PCO2 ART: 41 mmHg (ref 35.0–45.0)
PEEP: 5 cmH2O
Patient temperature: 98.6
RATE: 16 resp/min
TCO2: 22 mmol/L (ref 0–100)
pH, Arterial: 7.326 — ABNORMAL LOW (ref 7.350–7.450)
pO2, Arterial: 101 mmHg — ABNORMAL HIGH (ref 80.0–100.0)

## 2013-09-04 LAB — CBC
HCT: 24.8 % — ABNORMAL LOW (ref 39.0–52.0)
Hemoglobin: 8.6 g/dL — ABNORMAL LOW (ref 13.0–17.0)
MCH: 32.1 pg (ref 26.0–34.0)
MCHC: 34.7 g/dL (ref 30.0–36.0)
MCV: 92.5 fL (ref 78.0–100.0)
PLATELETS: 193 10*3/uL (ref 150–400)
RBC: 2.68 MIL/uL — ABNORMAL LOW (ref 4.22–5.81)
RDW: 15.9 % — ABNORMAL HIGH (ref 11.5–15.5)
WBC: 13.6 10*3/uL — ABNORMAL HIGH (ref 4.0–10.5)

## 2013-09-04 LAB — COMPREHENSIVE METABOLIC PANEL
ALT: 49 U/L (ref 0–53)
AST: 40 U/L — AB (ref 0–37)
Albumin: 1.6 g/dL — ABNORMAL LOW (ref 3.5–5.2)
Alkaline Phosphatase: 253 U/L — ABNORMAL HIGH (ref 39–117)
BUN: 91 mg/dL — ABNORMAL HIGH (ref 6–23)
CALCIUM: 7.8 mg/dL — AB (ref 8.4–10.5)
CO2: 23 mEq/L (ref 19–32)
Chloride: 104 mEq/L (ref 96–112)
Creatinine, Ser: 2.34 mg/dL — ABNORMAL HIGH (ref 0.50–1.35)
GFR calc non Af Amer: 27 mL/min — ABNORMAL LOW (ref >90)
GFR, EST AFRICAN AMERICAN: 31 mL/min — AB (ref >90)
Glucose, Bld: 145 mg/dL — ABNORMAL HIGH (ref 70–99)
Potassium: 3.2 mEq/L — ABNORMAL LOW (ref 3.7–5.3)
Sodium: 142 mEq/L (ref 137–147)
TOTAL PROTEIN: 5.7 g/dL — AB (ref 6.0–8.3)
Total Bilirubin: 1 mg/dL (ref 0.3–1.2)

## 2013-09-04 MED ORDER — POTASSIUM CHLORIDE 20 MEQ/15ML (10%) PO LIQD
40.0000 meq | Freq: Once | ORAL | Status: AC
  Administered 2013-09-04: 40 meq
  Filled 2013-09-04: qty 30

## 2013-09-04 MED ORDER — SODIUM CHLORIDE 0.9 % IJ SOLN: 10.0000 mL | INTRAMUSCULAR | Status: DC | PRN

## 2013-09-04 MED ORDER — ALBUTEROL SULFATE (2.5 MG/3ML) 0.083% IN NEBU: 2.5000 mg | INHALATION_SOLUTION | RESPIRATORY_TRACT | Status: DC | PRN

## 2013-09-04 MED ORDER — SULFAMETHOXAZOLE-TRIMETHOPRIM 400-80 MG/5ML IV SOLN: 5.0000 mg/kg | Freq: Two times a day (BID) | INTRAVENOUS | Status: DC

## 2013-09-04 MED ORDER — VITAL 1.5 CAL PO LIQD
1000.0000 mL | ORAL | Status: DC
  Filled 2013-09-04: qty 1000

## 2013-09-04 MED ORDER — VITAL 1.5 CAL PO LIQD: 1000.0000 mL | ORAL | Status: DC

## 2013-09-04 MED ORDER — DARBEPOETIN ALFA-POLYSORBATE 100 MCG/0.5ML IJ SOLN: 100.0000 ug | INTRAMUSCULAR | Status: DC

## 2013-09-04 MED ORDER — TRACE MINERALS CR-CU-F-FE-I-MN-MO-SE-ZN IV SOLN
INTRAVENOUS | Status: DC
  Filled 2013-09-04: qty 1000

## 2013-09-04 MED ORDER — MIDAZOLAM HCL 2 MG/2ML IJ SOLN: 1.0000 mg | INTRAMUSCULAR | Status: DC | PRN

## 2013-09-04 MED ORDER — INSULIN ASPART 100 UNIT/ML ~~LOC~~ SOLN: 0.0000 [IU] | SUBCUTANEOUS | Status: DC

## 2013-09-04 MED ORDER — PRO-STAT SUGAR FREE PO LIQD: 30.0000 mL | Freq: Three times a day (TID) | ORAL | Status: DC

## 2013-09-04 MED ORDER — FENTANYL CITRATE 0.05 MG/ML IJ SOLN: 25.0000 ug | INTRAMUSCULAR | Status: DC | PRN

## 2013-09-04 MED ORDER — METOPROLOL TARTRATE 1 MG/ML IV SOLN: 5.0000 mg | INTRAVENOUS | Status: DC

## 2013-09-04 MED ORDER — FAT EMULSION 20 % IV EMUL
240.0000 mL | INTRAVENOUS | Status: DC
  Filled 2013-09-04: qty 250

## 2013-09-04 NOTE — Progress Notes (Signed)
hgb fairly stable.   Soft, nd. g tube ok.   Can slowly adv TF to goal If bleeds again - only option is IR, not a surgical candidate  Leighton Ruff. Redmond Pulling, MD, FACS General, Bariatric, & Minimally Invasive Surgery [facility] Surgery, [place]

## 2013-09-04 NOTE — Discharge Summary (Signed)
Physician Discharge Summary  Patient ID: Jason Harmon MRN: [id number] DOB/AGE: 17-Sep-1940 73 y.o.  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 He was evaluated by surgery, Dr. Donne Hazel and after discussion with family, the plan is for the patient to go to OR this morning for attempted bowel repair.  He has had a very complicated course over the past month when he initially presented to an OSH 1/9 with SBO/ischemic bowel requiring laparotomy and small bowel resection. His course was complicated by ARF, now on dialysis and respiratory failure resulting in tracheostomy and prolonged mechanical ventilation.     Hospital Course: SIGNIFICANT EVENTS:  1/22 Tracheostomy at outside [place]  2/01 Transfer from [facility], to ER, to [facility] ICU. CCS consulted  2/1 Exploratory laparotomy with segmental resection of small bowel anastomosis and sigmoid colon with placement of abdominal wound vac  2/02 Off pressors, did not tolerate HD  2/4 small bowel anastomosis with gastrostomy & End sigmoid colostomy  2/8 ATC x 24h  2/9 Hemoptysis, anemia >> PRBC transfusion; CXR with new infiltrate  2/10 Red rash from vancomycin, increased WOB  2/12 2 units PRBC transfusion  2/13 Transfuse PRBC  2/13 GI Nuclear scan -: Active GI bleeding from a loop of small bowel to the right of midline at or near the level of the pelvic brim, likely corresponding to the enteroenteric anastomosis  2/13 IR angiogram-no evidence of active bleed or AVM .  2/14 Still has sputum with blood, but less. Continues to have melena and needing  PRBC's.Unable to tolerate trach collar d/t increased wob  2/16 - 2/17 Unable to tolerate attempts @ [facility]  2/17 Palliative care consult , family wants full code.  STUDIES:  CT A/P with contrast 1/31: bowel perforation, likely anastomotic breakdown at proximal enteroenterostomy with pneumoperitoneum and spilling of oral contrast, moderate ascites with peritonitis  CT chest 2/12: b/l GGO, Lt > Rt pleural effusions   LINES / TUBES:  Tracheostomy 1/23 >>  L IJ HD catheter 1/21 >> out  CULTURES:  Sputum 2/9 >> Moderate Stenotrophomonas maltophilia   ANTIBIOTICS:  Zosyn 2/1 >> 2/12  Vancomycin 2/9 >> 2/10  Linezolid 2/10 >> 2/12  Bactrim 2/12 >> stop date 2/27     Labs at discharge Lab Results  Component Value Date   CREATININE 2.34* 09/04/2013   BUN 91* 09/04/2013   NA 142 09/04/2013   K 3.2* 09/04/2013   CL 104 09/04/2013   CO2 23 09/04/2013   Lab Results  Component Value Date   WBC 13.6* 09/04/2013   HGB 8.6* 09/04/2013   HCT 24.8* 09/04/2013   MCV 92.5 09/04/2013   PLT 193 09/04/2013   Lab Results  Component Value Date   ALT 49 09/04/2013   AST 40* 09/04/2013   ALKPHOS 253* 09/04/2013   BILITOT 1.0 09/04/2013   Lab Results  Component Value Date   INR 1.37 08/28/2013   INR 1.40 08/26/2013   INR 1.36 08/22/2013    Current radiology studies Dg Chest Port 1 View  09/03/2013   CLINICAL DATA:  Respiratory failure, followup  EXAM: PORTABLE CHEST - 1 VIEW  COMPARISON:  Portable exam 0604  hr compared to 09/01/2013  FINDINGS: Tracheostomy tube and left jugular line stable.  Normal heart size.  Severe diffuse bilateral airspace infiltrates unchanged.  Probable small right pleural effusion.  Skin folds project over left chest.  No pneumothorax.  IMPRESSION: Severe diffuse bilateral pulmonary infiltrates unchanged.   Electronically Signed   By: Lavonia Dana M.D.   On: 09/03/2013 08:02    Disposition:  73-Another Health Care Institution Not Defined  Discharge Orders   Future Orders Complete  By Expires   Discharge to SNF when bed available  As directed        Medication List    STOP taking these medications       acetaminophen 325 MG tablet  Commonly known as:  TYLENOL     amLODipine 5 MG tablet  Commonly known as:  NORVASC     clonazePAM 1 MG tablet  Commonly known as:  KLONOPIN     dexmedetomidine 200 MCG/50ML Soln  Commonly known as:  PRECEDEX     haloperidol lactate 5 MG/ML injection  Commonly known as:  HALDOL     insulin detemir 100 UNIT/ML injection  Commonly known as:  LEVEMIR     ipratropium-albuterol 0.5-2.5 (3) MG/3ML Soln  Commonly known as:  DUONEB     iron polysaccharides 150 MG capsule  Commonly known as:  NIFEREX     lactobacillus Pack     methylPREDNISolone sodium succinate 40 mg/mL injection  Commonly known as:  SOLU-MEDROL     metoprolol tartrate 25 MG tablet  Commonly known as:  LOPRESSOR  Replaced by:  metoprolol 1 MG/ML injection     mupirocin ointment 2 %  Commonly known as:  BACTROBAN     neomycin-bacitracin-polymyxin [phone] ointment     nitroGLYCERIN 0.4 MG SL tablet  Commonly known as:  NITROSTAT     sennosides-docusate sodium 8.6-50 MG tablet  Commonly known as:  SENOKOT-S     sodium chloride 0.9 % SOLN 100 mL with meropenem 1 G SOLR 1 g     temazepam 15 MG capsule  Commonly known as:  RESTORIL      TAKE these medications       albuterol (2.5 MG/3ML) 0.083% nebulizer solution  Commonly known as:  PROVENTIL  Take 3 mLs (2.5 mg total) by nebulization every 4 (four) hours as needed for wheezing.     chlorhexidine 0.12 % solution  Commonly known as:  PERIDEX  Use as directed 15 mLs in the mouth or throat 2 (two) times daily.     darbepoetin 100 MCG/0.5ML Soln injection  Commonly known as:  ARANESP  Inject 0.5 mLs (100 mcg total) into the skin every Monday at 6 PM.     dextrose 5 % SOLN 500 mL with sulfamethoxazole-trimethoprim 400-80 MG/5ML SOLN 376 mg  Inject 376 mg into the vein every 12 (twelve)  hours.     fat emulsion 20 % infusion  Inject into the vein continuous. Pt was receiving 2104ml at [facility] on M/W/F     feeding supplement (PRO-STAT SUGAR FREE 64) Liqd  Place 30 mLs into feeding tube 3 (three) times daily.     feeding supplement (VITAL 1.5 CAL) Liqd  Place 1,000 mLs into feeding tube continuous.     fentaNYL 0.05 MG/ML injection  Commonly known as:  SUBLIMAZE  Inject 0.5-1 mLs (25-50 mcg total) into the vein every 2 (two) hours as needed for severe pain (to maintain RASS goal.).     hydroxypropyl methylcellulose 2.5 % ophthalmic solution  Commonly known as:  ISOPTO TEARS  Place 2 drops into both eyes every 4 (four) hours as needed for dry eyes.     insulin aspart 100 UNIT/ML injection  Commonly known as:  novoLOG  Inject 0-15 Units into the skin every 4 (four) hours.     metoprolol 1 MG/ML injection  Commonly known as:  LOPRESSOR  Inject 5 mLs (5 mg total) into the vein every 4 (four) hours.     midazolam 2 MG/2ML Soln injection  Commonly known as:  VERSED  Inject 1-2 mLs (1-2 mg total) into the vein every 3 (three) hours as needed for agitation or sedation.     pantoprazole 40 MG injection  Commonly known as:  PROTONIX  Inject 40 mg into the vein 2 (two) times daily.     sodium chloride 0.9 % injection  10-40 mLs by Intracatheter route as needed (flush).     TPN (CLINIMIX-E)ADULT WITH LYTES  Inject 65 mL/hr into the vein continuous.          Discharged Condition: poor  Time spent on discharge greater than 40 minutes.  Vital signs at Discharge. Temp:  [99.2 F (37.3 C)-100.9 F (38.3 C)] 99.2 F (37.3 C) (02/18 0740) Pulse Rate:  [92-107] 105 (02/18 0800) Resp:  [24-35] 33 (02/18 1000) BP: (132-158)/(72-95) 140/85 mmHg (02/18 1000) SpO2:  [90 %-100 %] 96 % (02/18 0800) FiO2 (%):  [40 %-50 %] 50 % (02/18 0746) Weight:  [75.2 kg (165 lb 12.6 oz)] 75.2 kg (165 lb 12.6 oz) (02/18 0500) Office follow up Special Information or  instructions.  Per [facility] Note no further dialysis offered per Renal team at [facility].  Signed: [name] Minor ACNP Maryanna Shape PCCM Pager [phone] till 3 pm If no answer page [phone] 09/04/2013, 10:54 AM     Above reviewed and plan discussed with ACNP Minor   Merton Border, MD ; [facility] service Mobile [phone].  After 5:30 PM or weekends, call [phone]

## 2013-09-04 NOTE — Progress Notes (Signed)
Patient ID: Jason Harmon, male   DOB: 08/23/1940, 73 y.o.   MRN: [id number]   Subjective: Awake on vent via TC.  H&h are stable.  Tolerating TF, last residual 0.    Objective:  Vital signs:  Filed Vitals:   09/04/13 0600 09/04/13 0700 09/04/13 0740 09/04/13 0746  BP: 146/80 158/95  158/95  Pulse: 92 102  106  Temp:   99.2 F (37.3 C)   TempSrc:   Oral   Resp: 31 31  32  Height:      Weight:      SpO2: 100% 94%  96%    Last BM Date: 09/03/13  Intake/Output   Yesterday:  02/17 0701 - 02/18 0700 In: 3115 [NG/GT:520; IV Piggyback:1025; TPN:1480] Out: 3205 [Urine:2605; Stool:600] This shift:    I/O last 3 completed shifts: In: 29 [Other:240; NG/GT:820; IV Piggyback:1025] Out: 5205 [Urine:4105; Stool:1100]    Physical Exam:  General: awake.   Abdomen: Soft. Nondistended. Midline wound is open and pink, small amount of eschar to superior aspect of the wound, no infection.  Ostomy with liquid yellow stool, does not appear melanotic. Stoma is pink and viable.    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   Palliative care encounter    Results:   Labs: Results for orders placed during the hospital encounter of 08/18/13 (from the past 48 hour(s))  GLUCOSE, CAPILLARY     Status: Abnormal   Collection Time    09/02/13 12:03 PM      Result Value Ref Range   Glucose-Capillary 134 (*) 70 - 99 mg/dL   Comment 1 Documented in Chart     Comment 2 Notify RN    GLUCOSE, CAPILLARY     Status: Abnormal   Collection Time    09/02/13  3:45 PM      Result Value Ref Range   Glucose-Capillary 155 (*) 70 - 99 mg/dL   Comment 1 Documented in Chart     Comment 2 Notify RN    GLUCOSE, CAPILLARY     Status: Abnormal   Collection Time    09/02/13  7:56 PM      Result Value Ref Range   Glucose-Capillary 110 (*) 70 - 99 mg/dL   Comment 1 Documented in Chart     Comment 2 Notify RN    GLUCOSE, CAPILLARY     Status: Abnormal   Collection Time    09/02/13 11:56 PM      Result Value Ref Range   Glucose-Capillary 138 (*) 70 - 99 mg/dL  GLUCOSE, CAPILLARY     Status: Abnormal   Collection Time    09/03/13  4:19 AM      Result Value Ref Range   Glucose-Capillary 138 (*) 70 - 99 mg/dL   Comment 1 Documented in Chart     Comment 2 Notify RN    BASIC METABOLIC PANEL     Status: Abnormal   Collection Time    09/03/13  4:30 AM      Result Value Ref Range   Sodium 144  137 - 147 mEq/L   Potassium 3.0 (*) 3.7 - 5.3 mEq/L   Chloride 102  96 - 112 mEq/L   CO2 24  19 - 32 mEq/L   Glucose, Bld 138 (*) 70 - 99 mg/dL   BUN 96 (*) 6 - 23  mg/dL   Creatinine, Ser 2.61 (*) 0.50 - 1.35 mg/dL   Calcium 7.4 (*) 8.4 - 10.5 mg/dL   GFR calc non Af Amer 23 (*) >90 mL/min   GFR calc Af Amer 27 (*) >90 mL/min   Comment: (NOTE)     The eGFR has been calculated using the CKD EPI equation.     This calculation has not been validated in all clinical situations.     eGFR's persistently <90 mL/min signify possible Chronic Kidney     Disease.  PHOSPHORUS     Status: None   Collection Time    09/03/13  4:30 AM      Result Value Ref Range   Phosphorus 3.5  2.3 - 4.6 mg/dL  GLUCOSE, CAPILLARY     Status: Abnormal   Collection Time    09/03/13  8:10 AM      Result Value Ref Range   Glucose-Capillary 103 (*) 70 - 99 mg/dL   Comment 1 Notify RN     Comment 2 Documented in Chart    HEMOGLOBIN AND HEMATOCRIT, BLOOD     Status: Abnormal   Collection Time    09/03/13  8:50 AM      Result Value Ref Range   Hemoglobin 9.1 (*) 13.0 - 17.0 g/dL   HCT 26.2 (*) 39.0 - 52.0 %  GLUCOSE, CAPILLARY     Status: Abnormal   Collection Time    09/03/13 12:20 PM      Result Value Ref Range   Glucose-Capillary 140 (*) 70 - 99 mg/dL   Comment 1 Documented in Chart     Comment 2 Notify RN    GLUCOSE, CAPILLARY     Status: Abnormal   Collection  Time    09/03/13  5:05 PM      Result Value Ref Range   Glucose-Capillary 147 (*) 70 - 99 mg/dL  GLUCOSE, CAPILLARY     Status: Abnormal   Collection Time    09/03/13  7:34 PM      Result Value Ref Range   Glucose-Capillary 119 (*) 70 - 99 mg/dL   Comment 1 Documented in Chart     Comment 2 Notify RN    GLUCOSE, CAPILLARY     Status: Abnormal   Collection Time    09/03/13 11:44 PM      Result Value Ref Range   Glucose-Capillary 155 (*) 70 - 99 mg/dL   Comment 1 Documented in Chart     Comment 2 Notify RN    GLUCOSE, CAPILLARY     Status: Abnormal   Collection Time    09/04/13  3:51 AM      Result Value Ref Range   Glucose-Capillary 135 (*) 70 - 99 mg/dL   Comment 1 Documented in Chart     Comment 2 Notify RN    CBC     Status: Abnormal   Collection Time    09/04/13  4:00 AM      Result Value Ref Range   WBC 13.6 (*) 4.0 - 10.5 K/uL   RBC 2.68 (*) 4.22 - 5.81 MIL/uL   Hemoglobin 8.6 (*) 13.0 - 17.0 g/dL   HCT 24.8 (*) 39.0 - 52.0 %   MCV 92.5  78.0 - 100.0 fL   MCH 32.1  26.0 - 34.0 pg   MCHC 34.7  30.0 - 36.0 g/dL   RDW 15.9 (*) 11.5 - 15.5 %   Platelets 193  150 - 400 K/uL   Comment:  DELTA CHECK NOTED     REPEATED TO VERIFY  COMPREHENSIVE METABOLIC PANEL     Status: Abnormal   Collection Time    09/04/13  4:00 AM      Result Value Ref Range   Sodium 142  137 - 147 mEq/L   Potassium 3.2 (*) 3.7 - 5.3 mEq/L   Chloride 104  96 - 112 mEq/L   CO2 23  19 - 32 mEq/L   Glucose, Bld 145 (*) 70 - 99 mg/dL   BUN 91 (*) 6 - 23 mg/dL   Creatinine, Ser 2.34 (*) 0.50 - 1.35 mg/dL   Calcium 7.8 (*) 8.4 - 10.5 mg/dL   Total Protein 5.7 (*) 6.0 - 8.3 g/dL   Albumin 1.6 (*) 3.5 - 5.2 g/dL   AST 40 (*) 0 - 37 U/L   ALT 49  0 - 53 U/L   Alkaline Phosphatase 253 (*) 39 - 117 U/L   Total Bilirubin 1.0  0.3 - 1.2 mg/dL   GFR calc non Af Amer 27 (*) >90 mL/min   GFR calc Af Amer 31 (*) >90 mL/min   Comment: (NOTE)     The eGFR has been calculated using the CKD EPI equation.      This calculation has not been validated in all clinical situations.     eGFR's persistently <90 mL/min signify possible Chronic Kidney     Disease.    Imaging / Studies: Dg Chest Port 1 View  09/03/2013   CLINICAL DATA:  Respiratory failure, followup  EXAM: PORTABLE CHEST - 1 VIEW  COMPARISON:  Portable exam 0604 hr compared to 09/01/2013  FINDINGS: Tracheostomy tube and left jugular line stable.  Normal heart size.  Severe diffuse bilateral airspace infiltrates unchanged.  Probable small right pleural effusion.  Skin folds project over left chest.  No pneumothorax.  IMPRESSION: Severe diffuse bilateral pulmonary infiltrates unchanged.   Electronically Signed   By: Lavonia Dana M.D.   On: 09/03/2013 08:02    Medications / Allergies: per chart  Antibiotics: Anti-infectives   Start     Dose/Rate Route Frequency Ordered Stop   08/29/13 1200  sulfamethoxazole-trimethoprim (BACTRIM) 400 mg of trimethoprim in dextrose 5 % 500 mL IVPB     400 mg of trimethoprim 350 mL/hr over 90 Minutes Intravenous Every 12 hours 08/29/13 1149     08/28/13 1900  vancomycin (VANCOCIN) 1,250 mg in sodium chloride 0.9 % 250 mL IVPB  Status:  Discontinued     1,250 mg 166.7 mL/hr over 90 Minutes Intravenous Every 48 hours 08/26/13 1758 08/27/13 0900   08/27/13 0930  linezolid (ZYVOX) IVPB 600 mg  Status:  Discontinued     600 mg 300 mL/hr over 60 Minutes Intravenous Every 12 hours 08/27/13 0900 08/29/13 1057   08/26/13 1900  vancomycin (VANCOCIN) 1,750 mg in sodium chloride 0.9 % 500 mL IVPB     1,750 mg 250 mL/hr over 120 Minutes Intravenous  Once 08/26/13 1758 08/26/13 2044   08/18/13 1400  piperacillin-tazobactam (ZOSYN) IVPB 2.25 g  Status:  Discontinued     2.25 g 100 mL/hr over 30 Minutes Intravenous 3 times per day 08/18/13 1124 08/29/13 1057   08/18/13 0500  piperacillin-tazobactam (ZOSYN) IVPB 3.375 g  Status:  Discontinued     3.375 g 12.5 mL/hr over 240 Minutes Intravenous 3 times per day 08/18/13 0434  08/18/13 1124      Assessment/Plan 1.Pod 17/15 reexploration with small bowel anastmosis, colostomy and g tube  2. VDRF  3. Anemia, ABL secondary to melena from GI tract and trach  4. PCM/TNA/TF   Low residuals, increase TF to goal rate.  Recommend weaning off TPN.   H&H are stable and does not appear to be actively bleeding Recommend avoiding anticoagulation He is not a surgical candidate. If hgb drops significantly, repeat tragged RBC scan and repeat IR angio.  Await palliative care consult  Erby Pian, [facility] Pager [phone] Office [phone]  09/04/2013 8:16 AM

## 2013-09-04 NOTE — Progress Notes (Signed)
Speech-Language Pathology Note  Chart review complete as part of tracheostomy team. Patient remains on vent. SLP will continue to f/u. MD, please order PMSV evaluation if/when patient becomes appropriate.  Deundre Thong MA, CCC-SLP [phone]   

## 2013-09-04 NOTE — Progress Notes (Signed)
Transported at this time to [facility] with no complications. RT in [facility] placed patient on vent with current settings.

## 2013-09-04 NOTE — Care Management Note (Signed)
    Page 1 of 2   09/04/2013     11:34:35 AM   CARE MANAGEMENT NOTE 09/04/2013  Patient:  [name]   Account Number:  [id number]  Date Initiated:  08/19/2013  Documentation initiated by:  [facility]  Subjective/Objective Assessment:   Admitted with bowel perf from [facility] - back to surgery with VAC placement - on vent and pressors.     Action/Plan:   Anticipated DC Date:  08/26/2013   Anticipated DC Plan:  LONG TERM ACUTE CARE (LTAC)      DC Planning Services  CM consult      Choice offered to / List presented to:             Status of service:  Completed, signed off Medicare Important Message given?   (If response is "NO", the following Medicare IM given date fields will be blank) Date Medicare IM given:   Date Additional Medicare IM given:    Discharge Disposition:  LONG TERM ACUTE CARE (LTAC)  Per UR Regulation:  Reviewed for med. necessity/level of care/duration of stay  If discussed at [place] of Stay Meetings, dates discussed:   08/22/2013  08/27/2013    Comments:  ContactKimothy, Jason Harmon Spouse [phone]                 Jason Harmon,Jason Harmon Son [phone]  09-04-13 11:30am Luz Lex, RNBSN [phone] Palliative discussion yesterday.  Family wants to continue full support.  Plan for transfer back to [facility] today. [facility] after 4pm today  Updated family in room and nurse.  09-02-13 10:20am Luz Lex, RNBSN [phone] Non surgical candidate - no transfusions since Saturday. Possible back to [facility] today.  08-28-13 10:30am [place] [phone] Continues to drop hbg - surgery feels bleed from anastamosis, but poor surgical candidate.  08-27-13 11am Luz Lex, RNBSN [phone] Worsening resp function - placed back on vent -

## 2013-09-04 NOTE — Progress Notes (Signed)
Jason Harmon is planning on d/c and return to [facility] this afternoon. I spoke with his wife who is pleased with the plan to return to [facility]. They are still very hopeful and optimistic for recovery. His wife talks of getting him to [facility] and "getting his strength back." They are clear that they want to maintain full code and aggressive treatment to give him any chance of recovery that might be possible.   Vinie Sill, NP Palliative Medicine Team Pager # [phone] Team Phone # [phone]

## 2013-09-04 NOTE — Progress Notes (Signed)
Patient ID: Jason Harmon, male   DOB: [birth year], 73 y.o.   MRN: [id number] S:remains nonverbal O:BP 132/76  Pulse 105  Temp(Src) 99.2 F (37.3 C) (Oral)  Resp 32  Ht 5\' 11"  (1.803 m)  Wt 75.2 kg (165 lb 12.6 oz)  BMI 23.13 kg/m2  SpO2 96%  Intake/Output Summary (Last 24 hours) at 09/04/13 0943 Last data filed at 09/04/13 0900  Gross per 24 hour  Intake   2985 ml  Output   3155 ml  Net   -170 ml   Intake/Output: I/O last 3 completed shifts: In: 28 [Other:240; NG/GT:820; IV Piggyback:1025] Out: 5205 [Urine:4105; Stool:1100]  Intake/Output this shift:  Total I/O In: 170 [NG/GT:50; TPN:120] Out: 250 [Urine:250] Weight change: -0.6 kg (-1 lb 5.2 oz) MWU:XLKGMWN WM, chronically ill-appearing CVS:no rub Resp:scattered rhonchi UUV:[id number] Ext:+edema of hands and feet   Recent Labs Lab 08/28/13 1450 08/29/13 0320 08/30/13 0405 08/31/13 0345 09/01/13 0500 09/02/13 0515 09/03/13 0430 09/04/13 0400  NA 145 149* 145 145 146 140 144 142  K 3.3* 3.4* 3.0* 3.2* 3.4* 3.1* 3.0* 3.2*  CL 109 111 106 106 105 100 102 104  CO2 21 22 25 25 25 25 24 23   GLUCOSE 140* 225* 197* 162* 132* 128* 138* 145*  BUN 95* 91* 86* 87* 89* 96* 96* 91*  CREATININE 2.68* 2.58* 2.54* 2.50* 2.53* 2.73* 2.61* 2.34*  ALBUMIN 1.5* 1.3* 1.3* 1.2* 1.5* 1.5*  --  1.6*  CALCIUM 7.3* 7.1* 7.5* 7.3* 7.6* 7.3* 7.4* 7.8*  PHOS 3.8 3.8 4.4 4.8* 5.1* 4.3 3.5  --   AST  --  20  --   --  34 36  --  40*  ALT  --  17  --   --  29 35  --  49   Liver Function Tests:  Recent Labs Lab 09/01/13 0500 09/02/13 0515 09/04/13 0400  AST 34 36 40*  ALT 29 35 49  ALKPHOS 262* 244* 253*  BILITOT 1.5* 1.2 1.0  PROT 5.4* 5.1* 5.7*  ALBUMIN 1.5* 1.5* 1.6*   No results found for this basename: LIPASE, AMYLASE,  in the last 168 hours No results found for this basename: AMMONIA,  in the last 168 hours CBC:  Recent Labs Lab 08/31/13 0345 08/31/13 1510 09/01/13 0500 09/02/13 0515 09/03/13 0850 09/04/13 0400   WBC 19.7* 20.3* 18.5* 12.2*  --  13.6*  NEUTROABS  --   --   --  10.6*  --   --   HGB 6.4* 9.3* 9.1* 7.9* 9.1* 8.6*  HCT 17.8* 26.3* 25.2* 22.4* 26.2* 24.8*  MCV 87.7 89.5 87.8 90.0  --  92.5  PLT 124* 124* 131* 120*  --  193   Cardiac Enzymes: No results found for this basename: CKTOTAL, CKMB, CKMBINDEX, TROPONINI,  in the last 168 hours CBG:  Recent Labs Lab 09/03/13 1705 09/03/13 1934 09/03/13 2344 09/04/13 0351 09/04/13 0738  GLUCAP 147* 119* 155* 135* 117*    Iron Studies: No results found for this basename: IRON, TIBC, TRANSFERRIN, FERRITIN,  in the last 72 hours Studies/Results: Dg Chest Port 1 View  09/03/2013   CLINICAL DATA:  Respiratory failure, followup  EXAM: PORTABLE CHEST - 1 VIEW  COMPARISON:  Portable exam 0604 hr compared to 09/01/2013  FINDINGS: Tracheostomy tube and left jugular line stable.  Normal heart size.  Severe diffuse bilateral airspace infiltrates unchanged.  Probable small right pleural effusion.  Skin folds project over left chest.  No pneumothorax.  IMPRESSION: Severe diffuse bilateral pulmonary  infiltrates unchanged.   Electronically Signed   By: Lavonia Dana M.D.   On: 09/03/2013 08:02   . antiseptic oral rinse  15 mL Mouth Rinse QID  . chlorhexidine  15 mL Mouth Rinse BID  . darbepoetin (ARANESP) injection - NON-DIALYSIS  100 mcg Subcutaneous Q Mon-1800  . feeding supplement (PRO-STAT SUGAR FREE 64)  30 mL Per Tube TID  . insulin aspart  0-15 Units Subcutaneous 6 times per day  . metoprolol  5 mg Intravenous Q4H  . pantoprazole (PROTONIX) IV  40 mg Intravenous Q24H  . sulfamethoxazole-trimethoprim  400 mg of trimethoprim Intravenous Q12H    BMET    Component Value Date/Time   NA 142 09/04/2013 0400   K 3.2* 09/04/2013 0400   CL 104 09/04/2013 0400   CO2 23 09/04/2013 0400   GLUCOSE 145* 09/04/2013 0400   BUN 91* 09/04/2013 0400   CREATININE 2.34* 09/04/2013 0400   CALCIUM 7.8* 09/04/2013 0400   GFRNONAA 27* 09/04/2013 0400   GFRAA 31*  09/04/2013 0400   CBC    Component Value Date/Time   WBC 13.6* 09/04/2013 0400   RBC 2.68* 09/04/2013 0400   HGB 8.6* 09/04/2013 0400   HCT 24.8* 09/04/2013 0400   PLT 193 09/04/2013 0400   MCV 92.5 09/04/2013 0400   MCH 32.1 09/04/2013 0400   MCHC 34.7 09/04/2013 0400   RDW 15.9* 09/04/2013 0400   LYMPHSABS 0.5* 09/02/2013 0515   MONOABS 1.0 09/02/2013 0515   EOSABS 0.1 09/02/2013 0515   BASOSABS 0.0 09/02/2013 0515     Assessment/Plan:  47M with dialysis dependent prolonged AKI- but no HD since 2/2, with chronic ventilatory failure, small bowel perforation (repaired 08/20/13), abdominal septic shock  1. Non-oliguric AKI due to ischemic ATN with slow recovery but is no longer dialysis dependent. Recent bump in BUN/Cr, continues to improve after lowering diuretics.  1. Continue to hold lasix and follow his UOP (has prerenal azotemia from diuresis and GI Blood loss but now auto-diuresing and likely in recovery phase from ATN) 2. Would not offer further HD as this would not fix the underlying issue and would be futile care.  3. His trach will necessitate LTAC placement and would strongly recommend palliative care consult to help set goals/limits of care 4. Watch sulfamethoxazole dose in advanced CKD 2. SIRS from small bowel perforation- followed by [facility]  3. Hypokalemia- replete gently given worsening renal function 4. ABLA- transfused regularly 5. VDRF s/p trach- per PCCM 6. Dispo- poor overall prognosis. Recommend palliative care consult to help set goals/limits of care 7. Nothing further to add. Will sign off, please call with questions/concerns Romin Divita A

## 2013-09-04 NOTE — Progress Notes (Addendum)
PARENTERAL NUTRITION CONSULT NOTE - FOLLOW UP  Pharmacy Consult for TPN Indication: Perforated viscous  No Known Allergies  Patient Measurements: Height: 5\' 11"  (180.3 cm) (5'11") Weight: 165 lb 12.6 oz (75.2 kg) IBW/kg (Calculated) : 75.3   Vital Signs: Temp: 100.8 F (38.2 C) (02/18 0334) Temp src: Oral (02/18 0334) BP: 146/80 mmHg (02/18 0600) Pulse Rate: 92 (02/18 0600) Intake/Output from previous day: 02/17 0701 - 02/18 0700 In: 3115 [NG/GT:520; IV Piggyback:1025; TPN:1480] Out: 3205 [Urine:2605; Stool:600] Intake/Output from this shift:    Labs:  Recent Labs  09/02/13 0515 09/03/13 0850 09/04/13 0400  WBC 12.2*  --  13.6*  HGB 7.9* 9.1* 8.6*  HCT 22.4* 26.2* 24.8*  PLT 120*  --  193     Recent Labs  09/02/13 0515 09/03/13 0430 09/04/13 0400  NA 140 144 142  K 3.1* 3.0* 3.2*  CL 100 102 104  CO2 25 24 23   GLUCOSE 128* 138* 145*  BUN 96* 96* 91*  CREATININE 2.73* 2.61* 2.34*  CALCIUM 7.3* 7.4* 7.8*  MG 1.8  --   --   PHOS 4.3 3.5  --   PROT 5.1*  --  5.7*  ALBUMIN 1.5*  --  1.6*  AST 36  --  40*  ALT 35  --  49  ALKPHOS 244*  --  253*  BILITOT 1.2  --  1.0  PREALBUMIN 12.5*  --   --   TRIG 123  --   --    Estimated Creatinine Clearance: 31.2 ml/min (by C-G formula based on Cr of 2.34).    Recent Labs  09/03/13 1934 09/03/13 2344 09/04/13 0351  GLUCAP 119* 155* 135*    Medications:  Scheduled:  . antiseptic oral rinse  15 mL Mouth Rinse QID  . chlorhexidine  15 mL Mouth Rinse BID  . darbepoetin (ARANESP) injection - NON-DIALYSIS  100 mcg Subcutaneous Q Mon-1800  . feeding supplement (PRO-STAT SUGAR FREE 64)  30 mL Per Tube TID  . insulin aspart  0-15 Units Subcutaneous 6 times per day  . metoprolol  5 mg Intravenous Q4H  . pantoprazole (PROTONIX) IV  40 mg Intravenous Q24H  . sulfamethoxazole-trimethoprim  400 mg of trimethoprim Intravenous Q12H    Insulin Requirements in the past 24 hours:  9 units mod SSI/12h & 20 units  insulin in TPN   Current Nutrition:  -Clinimix E 5/15 at 60 ml/hr + lipids at 10 ml/hr- 72 gm of protein and 1584 kcals  -Vital 1.5 at 33mL/hr + Prostat 54mL TID, OK to advance TFs to goal but continues at 78ml/hr (Goal rate is 26mL/hr with Prostat liquid 41mL PT TID. This will provide 134g protein and 2280 kcal)   Nutritional Goals:  2100-2300 kCal, 120-135 grams of protein per day per RD recommendations 2/9   Admit: 73 year old male with SBO and ischemic bowel s/p resection at [facility]. Was transferred to [facility] for continuing hemodialysis and inability to wean from ventilator. developed a new bowel perforation at his anastomosis site and is s/p ex lap (2/1) with small bowel resection and wound vac placement. Now with melena in ostomy- Angiogram showed no evidence of active bleed or AVM He is not a surgical candidate  GI: new bowel perforation s/p ex lap; was on TPN at [facility]. To OR 2/3 for small bowel anastomosis, colostomy, gastrostomy tube. (+)tagged RBC study, hence mesenteric angiogram 2/13 & no bleeding source found. Minimal residuals noted with TFs  PPI-IV   Nutrition: TF started 2/9- pt  had emesis when TF @ 20 ml/hr with Pro-Stat TID- tolerating with minimal residuals.   Endo: CBGs 119-155  Lytes: K 3.2, Na 142    Will give KCl 62mEq per tube x 1.   Renal:  Hasn't had HD since 2/2, & only tolerated 1 hour at that time d/t tachycardia and decreased BP. Creat 2.61 UOP 2.2 ml/kg/hr. Fluid overload felt to be primarily d/t transfusions.  Off lasix, no futher plans for HD  Hepatobil:  AlkPhos 244- improved this AM.  Palb 12.5, slightly decreased  Heme: Hemoptysis and melanotic stools. Hg 8.6 last transfusion 2/13. No source of bleeding found on angiogram; multiple transfusions. Aranesp 177mcg SQ qMon   ID: Abx at [facility]: Flagyl 1/21>>2/1, Meropenem 1/24>>2/1  Zosyn 2/1>> 2/12 Vanc 2/9>>2/10  Linezolid 2/10>> 2/12  Bactrim IV 2/12 >>  2/9 trach aspirate: Stenotrophomonas maltophilia,  s: SMX/TMP  WBC 13.6, Tmax 100.9  TPN Access: CVC   Plan:  - Will ask RN to titrate TF to goal + Prostat 55mL TID  - Dec Clinimix 5/15 (no lytes) today to 30 ml/hr + 20% lipids at 10 ml/hr.  _KCl 40 mEq per tube X 1  - Continue Insulin to 20 units per bag. Continue SSI q4h.  - TPN labs as ordered  Excell Seltzer, PharmD Clinical Pharmacist [facility]

## 2013-09-05 ENCOUNTER — Other Ambulatory Visit (HOSPITAL_COMMUNITY): Payer: Self-pay

## 2013-09-05 DIAGNOSIS — Z930 Tracheostomy status: Secondary | ICD-10-CM

## 2013-09-05 DIAGNOSIS — J9600 Acute respiratory failure, unspecified whether with hypoxia or hypercapnia: Secondary | ICD-10-CM | POA: Diagnosis not present

## 2013-09-05 DIAGNOSIS — J811 Chronic pulmonary edema: Secondary | ICD-10-CM

## 2013-09-05 DIAGNOSIS — J189 Pneumonia, unspecified organism: Secondary | ICD-10-CM

## 2013-09-05 DIAGNOSIS — R042 Hemoptysis: Secondary | ICD-10-CM

## 2013-09-05 DIAGNOSIS — J9620 Acute and chronic respiratory failure, unspecified whether with hypoxia or hypercapnia: Secondary | ICD-10-CM

## 2013-09-05 LAB — CBC
HCT: 25.2 % — ABNORMAL LOW (ref 39.0–52.0)
Hemoglobin: 8.4 g/dL — ABNORMAL LOW (ref 13.0–17.0)
MCH: 31.2 pg (ref 26.0–34.0)
MCHC: 33.3 g/dL (ref 30.0–36.0)
MCV: 93.7 fL (ref 78.0–100.0)
Platelets: 217 10*3/uL (ref 150–400)
RBC: 2.69 MIL/uL — ABNORMAL LOW (ref 4.22–5.81)
RDW: 16.2 % — ABNORMAL HIGH (ref 11.5–15.5)
WBC: 13.5 10*3/uL — ABNORMAL HIGH (ref 4.0–10.5)

## 2013-09-05 LAB — COMPREHENSIVE METABOLIC PANEL
ALBUMIN: 1.5 g/dL — AB (ref 3.5–5.2)
ALK PHOS: 265 U/L — AB (ref 39–117)
ALT: 50 U/L (ref 0–53)
AST: 39 U/L — ABNORMAL HIGH (ref 0–37)
BUN: 83 mg/dL — ABNORMAL HIGH (ref 6–23)
CALCIUM: 7.9 mg/dL — AB (ref 8.4–10.5)
CO2: 20 mEq/L (ref 19–32)
Chloride: 106 mEq/L (ref 96–112)
Creatinine, Ser: 2.14 mg/dL — ABNORMAL HIGH (ref 0.50–1.35)
GFR calc non Af Amer: 30 mL/min — ABNORMAL LOW (ref >90)
GFR, EST AFRICAN AMERICAN: 34 mL/min — AB (ref >90)
Glucose, Bld: 145 mg/dL — ABNORMAL HIGH (ref 70–99)
Potassium: 4 mEq/L (ref 3.7–5.3)
Sodium: 143 mEq/L (ref 137–147)
TOTAL PROTEIN: 5.6 g/dL — AB (ref 6.0–8.3)
Total Bilirubin: 1 mg/dL (ref 0.3–1.2)

## 2013-09-05 LAB — PROCALCITONIN: Procalcitonin: 0.44 ng/mL

## 2013-09-05 LAB — PREALBUMIN: PREALBUMIN: 16.4 mg/dL — AB (ref 17.0–34.0)

## 2013-09-05 NOTE — Procedures (Signed)
Name:  Zaveon Gillen MRN:  [id number] DOB:  Mar 07, 1941  PROCEDURE NOTE  Procedure:   Flexible bronchoscopy (57017)  Indications:  Hemoptysis  Consent:  Procedure, benefits, risks and alternatives discussed.  Questions answered.  Consent obtained.  Anesthesia:  Already sedated for mechanical ventilation.   Procedure summary:  Appropriate equipment was assembled.  The patient was identified as Psychologist, forensic.  Safety timeout was performed. The patient was placed supine and adequate level of sedation was assured.  Flexible bronchoscope was lubricated and inserted via the endotracheal tube.  Airway examination was performed bilaterally to subsegmental level.  There were several small erosions noted on the right lateral and the posterior tracheal walls immediately distal to the tip of the tracheostomy tube. Those demonstrated mild capillary oozing.  Old blood was noted noted in the airway bilaterally, but no active bleeding, mucosal defects or endobronchial lesions were identified.  Specimens sent:  None  Complications:  No immediate complications were noted.  Hemodynamic parameters and oxygenation remained stable throughout the procedure.  Estimated blood loss:  Less then 5 mL.  Penne Lash, M.D. Pulmonary and [place] Pager: [phone]  09/05/2013, 1:06 PM

## 2013-09-05 NOTE — Consult Note (Signed)
PULMONARY  / CRITICAL CARE MEDICINE  Name: Jason Harmon MRN: [id number] DOB: Apr 29, 1941  CONSULTATION DATE: 09/05/2013  BRIEF PATIENT DESCRIPTION: 73 yo who initially presented to an outside facility on 1/9 with SBO / ischemic bowel requiring laparotomy and small bowel resection. His course was complicated by ARF, now on dialysis and respiratory failure resulting in tracheostomy and prolonged mechanical ventilation.  CULTURES: Recent Results (from the past 720 hour(s))  CULTURE, BLOOD (ROUTINE X 2)     Status: None   Collection Time    08/07/13 10:00 PM      Result Value Ref Range Status   Specimen Description BLOOD ARTERIAL   Final   Special Requests BOTTLES DRAWN AEROBIC ONLY 5CC   Final   Culture  Setup Time     Final   Value: 08/08/2013 00:45     Performed at [facility]   Culture     Final   Value: NO GROWTH 5 DAYS     Performed at [facility]   Report Status 08/14/2013 FINAL   Final  CULTURE, BLOOD (ROUTINE X 2)     Status: None   Collection Time    08/07/13 10:10 PM      Result Value Ref Range Status   Specimen Description BLOOD LEFT HAND   Final   Special Requests BOTTLES DRAWN AEROBIC ONLY 2CC   Final   Culture  Setup Time     Final   Value: 08/08/2013 00:45     Performed at [facility]   Culture     Final   Value: NO GROWTH 5 DAYS     Performed at [facility]   Report Status 08/14/2013 FINAL   Final  CULTURE, BLOOD (ROUTINE X 2)     Status: None   Collection Time    08/17/13 11:25 AM      Result Value Ref Range Status   Specimen Description BLOOD RIGHT HAND   Final   Special Requests BOTTLES DRAWN AEROBIC ONLY [facility]   Final   Culture  Setup Time     Final   Value: 08/17/2013 16:41     Performed at [facility]   Culture     Final   Value: NO GROWTH 5 DAYS     Performed at [facility]   Report Status 08/23/2013 FINAL   Final  CULTURE, BLOOD (ROUTINE X 2)     Status: None   Collection Time    08/17/13  11:30 AM      Result Value Ref Range Status   Specimen Description BLOOD LEFT HAND   Final   Special Requests BOTTLES DRAWN AEROBIC ONLY 4CC   Final   Culture  Setup Time     Final   Value: 08/17/2013 16:41     Performed at [facility]   Culture     Final   Value: NO GROWTH 5 DAYS     Performed at [facility]   Report Status 08/23/2013 FINAL   Final  MRSA PCR SCREENING     Status: None   Collection Time    08/18/13 10:05 AM      Result Value Ref Range Status   MRSA by PCR NEGATIVE  NEGATIVE Final   Comment:            The GeneXpert MRSA Assay (FDA     approved for NASAL specimens     only), is one component of a  comprehensive MRSA colonization     surveillance program. It is not     intended to diagnose MRSA     infection nor to guide or     monitor treatment for     MRSA infections.  CULTURE, RESPIRATORY (NON-EXPECTORATED)     Status: None   Collection Time    08/26/13  5:48 PM      Result Value Ref Range Status   Specimen Description TRACHEAL ASPIRATE   Final   Special Requests NONE   Final   Gram Stain     Final   Value: ABUNDANT WBC PRESENT, PREDOMINANTLY PMN     MODERATE SQUAMOUS EPITHELIAL CELLS PRESENT     RARE GRAM POSITIVE COCCI     IN PAIRS     Performed at [facility]   Culture     Final   Value: MODERATE STENOTROPHOMONAS [place]     Performed at [facility]   Report Status 08/29/2013 FINAL   Final   Organism ID, Bacteria STENOTROPHOMONAS MALTOPHILIA   Final   ANTIBIOTICS: Zosyn 2/1 >> 2/12  Vancomycin 2/9 >> 2/10  Linezolid 2/10 >> 2/12  Bactrim 2/12 >> stop date 2/27   The patient is encephalopathic and unable to provide history, which was obtained for available medical records.  HISTORY OF PRESENT ILLNESS:   73 yo male with renal failure on dialysis and multiple recent surgeries for ischemic bowel presenting from [facility] with rigid abdomen and CT demonstrating bowel perforation. He was evaluated by surgery,  Dr. Donne Hazel and after discussion with family, the plan is for the patient to go to OR this morning for attempted bowel repair.  He has had a very complicated course over the past month when he initially presented to an OSH 1/9 with SBO/ischemic bowel requiring laparotomy and small bowel resection. His course was complicated by ARF, now on dialysis and respiratory failure resulting in tracheostomy and prolonged mechanical ventilation.   PAST MEDICAL HISTORY :  Past Medical History  Diagnosis Date  . Renal disorder   . A-fib   . Sepsis   . Ischemic bowel disease   . Acute renal failure    Past Surgical History  Procedure Laterality Date  . Abdominal surgery    . Laparotomy N/A 08/18/2013    Procedure: EXPLORATORY LAPAROTOMY;  Surgeon: Joyice Faster. Cornett, MD;  Location: [place];  Service: General;  Laterality: N/A;  . Laparotomy N/A 08/20/2013    Procedure: EXPLORATORY LAPAROTOMY WITH  SMALL BOWEL RESECTION;  Surgeon: Rolm Bookbinder, MD;  Location: [place];  Service: General;  Laterality: N/A;  . Gastrostomy N/A 08/20/2013    Procedure: G-TUBE PLACEMENT;  Surgeon: Rolm Bookbinder, MD;  Location: [place];  Service: General;  Laterality: N/A;  . Colostomy N/A 08/20/2013    Procedure: COLOSTOMY;  Surgeon: Rolm Bookbinder, MD;  Location: [place];  Service: General;  Laterality: N/A;   Prior to Admission medications   Medication Sig Start Date End Date Taking? Authorizing Provider  albuterol (PROVENTIL) (2.5 MG/3ML) 0.083% nebulizer solution Take 3 mLs (2.5 mg total) by nebulization every 4 (four) hours as needed for wheezing. 09/04/13   Grace Bushy Minor, NP  Amino Acids-Protein Hydrolys (FEEDING SUPPLEMENT, PRO-STAT SUGAR FREE 64,) LIQD Place 30 mLs into feeding tube 3 (three) times daily. 09/04/13   Grace Bushy Minor, NP  chlorhexidine (PERIDEX) 0.12 % solution Use as directed 15 mLs in the mouth or throat 2 (two) times daily.    Historical Provider, MD  darbepoetin Kyra Searles)  100 MCG/0.5ML SOLN injection  Inject 0.5 mLs (100 mcg total) into the skin every Monday at 6 PM. 09/04/13   Grace Bushy Minor, NP  dextrose 5 % SOLN 500 mL with sulfamethoxazole-trimethoprim 400-80 MG/5ML SOLN 376 mg Inject 376 mg into the vein every 12 (twelve) hours. 09/04/13   Grace Bushy Minor, NP  fat emulsion 20 % infusion Inject into the vein continuous. Pt was receiving 240ml at [facility] on M/W/F    Historical Provider, MD  fentaNYL (SUBLIMAZE) 0.05 MG/ML injection Inject 0.5-1 mLs (25-50 mcg total) into the vein every 2 (two) hours as needed for severe pain (to maintain RASS goal.). 09/04/13   Grace Bushy Minor, NP  hydroxypropyl methylcellulose (ISOPTO TEARS) 2.5 % ophthalmic solution Place 2 drops into both eyes every 4 (four) hours as needed for dry eyes.    Historical Provider, MD  insulin aspart (NOVOLOG) 100 UNIT/ML injection Inject 0-15 Units into the skin every 4 (four) hours. 09/04/13   Grace Bushy Minor, NP  metoprolol (LOPRESSOR) 1 MG/ML injection Inject 5 mLs (5 mg total) into the vein every 4 (four) hours. 09/04/13   Grace Bushy Minor, NP  midazolam (VERSED) 2 MG/2ML SOLN injection Inject 1-2 mLs (1-2 mg total) into the vein every 3 (three) hours as needed for agitation or sedation. 09/04/13   Grace Bushy Minor, NP  Nutritional Supplements (FEEDING SUPPLEMENT, VITAL 1.5 CAL,) LIQD Place 1,000 mLs into feeding tube continuous. 09/04/13   Grace Bushy Minor, NP  pantoprazole (PROTONIX) 40 MG injection Inject 40 mg into the vein 2 (two) times daily.    Historical Provider, MD  sodium chloride 0.9 % injection 10-40 mLs by Intracatheter route as needed (flush). 09/04/13   Grace Bushy Minor, NP  TPN (CLINIMIX-E)ADULT WITH LYTES Inject 65 mL/hr into the vein continuous.    Historical Provider, MD   No Known Allergies  FAMILY HISTORY:  No family history on file.  SOCIAL HISTORY:  reports that he has never smoked. He has never used smokeless tobacco. He reports that he does not drink alcohol or use illicit drugs.  REVIEW OF SYSTEMS:   Unable to provide.  INTERVAL HISTORY:   PHYSICAL EXAMINATION:  Vital signs: Reviewed General:  Appears acutely ill, mechanically ventilated, synchronous Neuro:  Arouses to voice and follows commands HEENT:  PERRL,trach-> vent Cardiovascular:  RRR,  Lungs:  Bilateral diminished air entry, coarse rhonchi bilaterally. Hypoxic  Abdomen:  Dressing intact, TF + tna going Musculoskeletal:  Moves all extremities, no edema Skin:  Intact except for abd.  LABS:  Recent Labs Lab 09/03/13 0430 09/04/13 0400 09/05/13 0436  NA 144 142 143  K 3.0* 3.2* 4.0  CL 102 104 106  CO2 24 23 20   BUN 96* 91* 83*  CREATININE 2.61* 2.34* 2.14*  GLUCOSE 138* 145* 145*    Recent Labs Lab 09/02/13 0515 09/03/13 0850 09/04/13 0400 09/05/13 0436  HGB 7.9* 9.1* 8.6* 8.4*  HCT 22.4* 26.2* 24.8* 25.2*  WBC 12.2*  --  13.6* 13.5*  PLT 120*  --  193 217   IMAGING: Dg Chest Port 1 View  09/05/2013   CLINICAL DATA:  Respiratory failure.  EXAM: PORTABLE CHEST - 1 VIEW  COMPARISON:  DG CHEST 1V PORT dated 09/03/2013  FINDINGS: Tracheostomy tube, and left IJ catheter in stable position. Heart size normal. Dense persistent bilateral pulmonary infiltrates are noted, these are unchanged. Underlying small pleural effusions cannot be excluded. Degenerative changes thoracic spine.  IMPRESSION: 1. Stable line and tube positions. 2. Dense bilateral  diffuse unchanged pulmonary infiltrates. Small bilateral pleural effusions cannot be excluded .   Electronically Signed   By: Marcello Moores  Register   On: 09/05/2013 07:41   ASSESSMENT  Acute and chronic respiratory failure Pneumonia with STENOTROPHOMONAS MALTOPHILIA ARDS vs pulmonary edema Atrial fibrillation Tracheostomy status   Hemoptysis    PLAN  Goal pH>7.30, SpO2>92 Continuous mechanical support Increase PEEP + 8 VAP bundle Trend ABG/CXR Hold weaning until oxygen requirements improve Aim for neutral to negative fluid balance Antibiotics per primary  team Flexible bronchoscopy today to localize the source or determine laterality If significant hemoptysis would contact IR for possible embolization   [name] Minor ACNP [facility] PCCM Pager [phone] till 3 pm If no answer page [phone] 09/05/2013, 9:31 AM  I have personally obtained history, examined patient, evaluated and interpreted laboratory and imaging results, reviewed medical records, formulated assessment / plan and placed orders.  Doree Fudge, MD Pulmonary and [place] Pager: [phone]  09/05/2013, 2:53 PM

## 2013-09-06 DIAGNOSIS — K631 Perforation of intestine (nontraumatic): Secondary | ICD-10-CM

## 2013-09-06 NOTE — Progress Notes (Signed)
PULMONARY  / CRITICAL CARE MEDICINE  Name: Jason Harmon MRN: [id number] DOB: Apr 29, 1941  CONSULTATION DATE: 09/05/2013  BRIEF PATIENT DESCRIPTION: 73 yo who initially presented to an outside facility on 1/9 with SBO / ischemic bowel requiring laparotomy and small bowel resection. His course was complicated by ARF, now on dialysis and respiratory failure resulting in tracheostomy and prolonged mechanical ventilation.  CULTURES: Recent Results (from the past 720 hour(s))  CULTURE, BLOOD (ROUTINE X 2)     Status: None   Collection Time    08/07/13 10:00 PM      Result Value Ref Range Status   Specimen Description BLOOD ARTERIAL   Final   Special Requests BOTTLES DRAWN AEROBIC ONLY 5CC   Final   Culture  Setup Time     Final   Value: 08/08/2013 00:45     Performed at [facility]   Culture     Final   Value: NO GROWTH 5 DAYS     Performed at [facility]   Report Status 08/14/2013 FINAL   Final  CULTURE, BLOOD (ROUTINE X 2)     Status: None   Collection Time    08/07/13 10:10 PM      Result Value Ref Range Status   Specimen Description BLOOD LEFT HAND   Final   Special Requests BOTTLES DRAWN AEROBIC ONLY 2CC   Final   Culture  Setup Time     Final   Value: 08/08/2013 00:45     Performed at [facility]   Culture     Final   Value: NO GROWTH 5 DAYS     Performed at [facility]   Report Status 08/14/2013 FINAL   Final  CULTURE, BLOOD (ROUTINE X 2)     Status: None   Collection Time    08/17/13 11:25 AM      Result Value Ref Range Status   Specimen Description BLOOD RIGHT HAND   Final   Special Requests BOTTLES DRAWN AEROBIC ONLY [facility]   Final   Culture  Setup Time     Final   Value: 08/17/2013 16:41     Performed at [facility]   Culture     Final   Value: NO GROWTH 5 DAYS     Performed at [facility]   Report Status 08/23/2013 FINAL   Final  CULTURE, BLOOD (ROUTINE X 2)     Status: None   Collection Time    08/17/13  11:30 AM      Result Value Ref Range Status   Specimen Description BLOOD LEFT HAND   Final   Special Requests BOTTLES DRAWN AEROBIC ONLY 4CC   Final   Culture  Setup Time     Final   Value: 08/17/2013 16:41     Performed at [facility]   Culture     Final   Value: NO GROWTH 5 DAYS     Performed at [facility]   Report Status 08/23/2013 FINAL   Final  MRSA PCR SCREENING     Status: None   Collection Time    08/18/13 10:05 AM      Result Value Ref Range Status   MRSA by PCR NEGATIVE  NEGATIVE Final   Comment:            The GeneXpert MRSA Assay (FDA     approved for NASAL specimens     only), is one component of a  comprehensive MRSA colonization     surveillance program. It is not     intended to diagnose MRSA     infection nor to guide or     monitor treatment for     MRSA infections.  CULTURE, RESPIRATORY (NON-EXPECTORATED)     Status: None   Collection Time    08/26/13  5:48 PM      Result Value Ref Range Status   Specimen Description TRACHEAL ASPIRATE   Final   Special Requests NONE   Final   Gram Stain     Final   Value: ABUNDANT WBC PRESENT, PREDOMINANTLY PMN     MODERATE SQUAMOUS EPITHELIAL CELLS PRESENT     RARE GRAM POSITIVE COCCI     IN PAIRS     Performed at [facility]   Culture     Final   Value: MODERATE STENOTROPHOMONAS MALTOPHILIA     Performed at [facility]   Report Status 08/29/2013 FINAL   Final   Organism ID, Bacteria STENOTROPHOMONAS MALTOPHILIA   Final   ANTIBIOTICS: Zosyn 2/1 >> 2/12  Vancomycin 2/9 >> 2/10  Linezolid 2/10 >> 2/12  Bactrim 2/12 >> stop date 2/27    HISTORY OF PRESENT ILLNESS:   73 yo male with renal failure on dialysis and multiple recent surgeries for ischemic bowel presenting from [facility] with rigid abdomen and CT demonstrating bowel perforation. He was evaluated by surgery, Dr. Donne Hazel and after discussion with family, the plan is for the patient to go to OR this morning for  attempted bowel repair.  He has had a very complicated course over the past month when he initially presented to an OSH 1/9 with SBO/ischemic bowel requiring laparotomy and small bowel resection. His course was complicated by ARF, now on dialysis and respiratory failure resulting in tracheostomy and prolonged mechanical ventilation.     INTERVAL HISTORY:  FOB 2/19 with evidence for tracheal erosion and some bleeding No further hemoptysis noted 2/20  PHYSICAL EXAMINATION:  Vital signs: Reviewed  General:  Appears acutely ill, mechanically ventilated, synchronous Neuro:  Arouses to voice and follows commands HEENT:  PERRL,trach-> vent Cardiovascular:  RRR,  Lungs:  Bilateral diminished air entry, coarse rhonchi bilaterally.   Abdomen:  Dressing intact, TF + tna going Musculoskeletal:  Moves all extremities, no edema Skin:  Intact except for abd.  LABS:  Recent Labs Lab 09/03/13 0430 09/04/13 0400 09/05/13 0436  NA 144 142 143  K 3.0* 3.2* 4.0  CL 102 104 106  CO2 24 23 20   BUN 96* 91* 83*  CREATININE 2.61* 2.34* 2.14*  GLUCOSE 138* 145* 145*    Recent Labs Lab 09/02/13 0515 09/03/13 0850 09/04/13 0400 09/05/13 0436  HGB 7.9* 9.1* 8.6* 8.4*  HCT 22.4* 26.2* 24.8* 25.2*  WBC 12.2*  --  13.6* 13.5*  PLT 120*  --  193 217   IMAGING: Dg Chest Port 1 View  09/05/2013   CLINICAL DATA:  Respiratory failure.  EXAM: PORTABLE CHEST - 1 VIEW  COMPARISON:  DG CHEST 1V PORT dated 09/03/2013  FINDINGS: Tracheostomy tube, and left IJ catheter in stable position. Heart size normal. Dense persistent bilateral pulmonary infiltrates are noted, these are unchanged. Underlying small pleural effusions cannot be excluded. Degenerative changes thoracic spine.  IMPRESSION: 1. Stable line and tube positions. 2. Dense bilateral diffuse unchanged pulmonary infiltrates. Small bilateral pleural effusions cannot be excluded .   Electronically Signed   By: Marcello Moores  Register   On: 09/05/2013 07:41  ASSESSMENT  Acute and chronic respiratory failure Pneumonia with STENOTROPHOMONAS MALTOPHILIA ARDS +/- cardiogenic pulmonary edema Atrial fibrillation Tracheostomy status   Hemoptysis, likely due to tracheal erosion, ? Suctioning  PLAN  Goal pH>7.30, SpO2>92 Continuous mechanical support Increase PEEP + 8 VAP bundle Trend ABG/CXR Hold weaning until oxygen requirements improve Aim for neutral to negative fluid balance  Antibiotics per primary team Flexible bronchoscopy 2/19 showed distal trach erosion of trachea as source of blood   [name] Minor ACNP [facility] Pager [phone] till 3 pm If no answer page [phone] 09/06/2013, 9:53 AM   Baltazar Apo, MD, PhD 09/06/2013, 12:00 PM [facility] [phone] or if no answer [phone]

## 2013-09-07 ENCOUNTER — Other Ambulatory Visit (HOSPITAL_COMMUNITY): Payer: Self-pay

## 2013-09-07 DIAGNOSIS — J189 Pneumonia, unspecified organism: Secondary | ICD-10-CM | POA: Diagnosis not present

## 2013-09-07 DIAGNOSIS — J9600 Acute respiratory failure, unspecified whether with hypoxia or hypercapnia: Secondary | ICD-10-CM | POA: Diagnosis not present

## 2013-09-07 LAB — CBC
HEMATOCRIT: 23.8 % — AB (ref 39.0–52.0)
HEMOGLOBIN: 7.7 g/dL — AB (ref 13.0–17.0)
MCH: 31.4 pg (ref 26.0–34.0)
MCHC: 32.4 g/dL (ref 30.0–36.0)
MCV: 97.1 fL (ref 78.0–100.0)
Platelets: 208 10*3/uL (ref 150–400)
RBC: 2.45 MIL/uL — ABNORMAL LOW (ref 4.22–5.81)
RDW: 16.1 % — ABNORMAL HIGH (ref 11.5–15.5)
WBC: 11.2 10*3/uL — ABNORMAL HIGH (ref 4.0–10.5)

## 2013-09-07 LAB — BLOOD GAS, ARTERIAL
ACID-BASE DEFICIT: 2.9 mmol/L — AB (ref 0.0–2.0)
Bicarbonate: 21.5 mEq/L (ref 20.0–24.0)
FIO2: 0.4 %
LHR: 16 {breaths}/min
MECHVT: 600 mL
O2 SAT: 96.5 %
PATIENT TEMPERATURE: 99.6
PEEP/CPAP: 5 cmH2O
TCO2: 22.7 mmol/L (ref 0–100)
pCO2 arterial: 39.2 mmHg (ref 35.0–45.0)
pH, Arterial: 7.362 (ref 7.350–7.450)
pO2, Arterial: 81 mmHg (ref 80.0–100.0)

## 2013-09-07 LAB — BASIC METABOLIC PANEL
BUN: 75 mg/dL — AB (ref 6–23)
CHLORIDE: 112 meq/L (ref 96–112)
CO2: 21 mEq/L (ref 19–32)
Calcium: 7.8 mg/dL — ABNORMAL LOW (ref 8.4–10.5)
Creatinine, Ser: 1.94 mg/dL — ABNORMAL HIGH (ref 0.50–1.35)
GFR calc non Af Amer: 33 mL/min — ABNORMAL LOW (ref >90)
GFR, EST AFRICAN AMERICAN: 39 mL/min — AB (ref >90)
Glucose, Bld: 167 mg/dL — ABNORMAL HIGH (ref 70–99)
Potassium: 4.9 mEq/L (ref 3.7–5.3)
SODIUM: 146 meq/L (ref 137–147)

## 2013-09-09 ENCOUNTER — Other Ambulatory Visit (HOSPITAL_COMMUNITY): Payer: Self-pay

## 2013-09-09 DIAGNOSIS — J90 Pleural effusion, not elsewhere classified: Secondary | ICD-10-CM | POA: Diagnosis not present

## 2013-09-09 DIAGNOSIS — N400 Enlarged prostate without lower urinary tract symptoms: Secondary | ICD-10-CM | POA: Diagnosis not present

## 2013-09-09 DIAGNOSIS — J811 Chronic pulmonary edema: Secondary | ICD-10-CM | POA: Diagnosis not present

## 2013-09-09 DIAGNOSIS — Z452 Encounter for adjustment and management of vascular access device: Secondary | ICD-10-CM | POA: Diagnosis not present

## 2013-09-09 LAB — URINALYSIS, ROUTINE W REFLEX MICROSCOPIC
BILIRUBIN URINE: NEGATIVE
Bilirubin Urine: NEGATIVE
GLUCOSE, UA: NEGATIVE mg/dL
GLUCOSE, UA: NEGATIVE mg/dL
KETONES UR: NEGATIVE mg/dL
Ketones, ur: NEGATIVE mg/dL
Leukocytes, UA: NEGATIVE
Nitrite: NEGATIVE
Nitrite: NEGATIVE
PH: 5.5 (ref 5.0–8.0)
PH: 5.5 (ref 5.0–8.0)
Protein, ur: 30 mg/dL — AB
Protein, ur: 30 mg/dL — AB
Specific Gravity, Urine: 1.018 (ref 1.005–1.030)
Specific Gravity, Urine: 1.019 (ref 1.005–1.030)
Urobilinogen, UA: 0.2 mg/dL (ref 0.0–1.0)
Urobilinogen, UA: 0.2 mg/dL (ref 0.0–1.0)

## 2013-09-09 LAB — URINE MICROSCOPIC-ADD ON

## 2013-09-09 LAB — CBC
HCT: 21.5 % — ABNORMAL LOW (ref 39.0–52.0)
Hemoglobin: 6.5 g/dL — CL (ref 13.0–17.0)
MCH: 30.7 pg (ref 26.0–34.0)
MCHC: 30.2 g/dL (ref 30.0–36.0)
MCV: 101.4 fL — AB (ref 78.0–100.0)
Platelets: 197 10*3/uL (ref 150–400)
RBC: 2.12 MIL/uL — ABNORMAL LOW (ref 4.22–5.81)
RDW: 16.3 % — ABNORMAL HIGH (ref 11.5–15.5)
WBC: 10.9 10*3/uL — AB (ref 4.0–10.5)

## 2013-09-09 LAB — PREPARE RBC (CROSSMATCH)

## 2013-09-09 LAB — EXPECTORATED SPUTUM ASSESSMENT W REFEX TO RESP CULTURE

## 2013-09-09 LAB — BASIC METABOLIC PANEL
BUN: 79 mg/dL — ABNORMAL HIGH (ref 6–23)
CALCIUM: 7.7 mg/dL — AB (ref 8.4–10.5)
CO2: 22 mEq/L (ref 19–32)
Chloride: 114 mEq/L — ABNORMAL HIGH (ref 96–112)
Creatinine, Ser: 2.08 mg/dL — ABNORMAL HIGH (ref 0.50–1.35)
GFR calc non Af Amer: 31 mL/min — ABNORMAL LOW (ref >90)
GFR, EST AFRICAN AMERICAN: 35 mL/min — AB (ref >90)
Glucose, Bld: 124 mg/dL — ABNORMAL HIGH (ref 70–99)
Potassium: 5.9 mEq/L — ABNORMAL HIGH (ref 3.7–5.3)
SODIUM: 148 meq/L — AB (ref 137–147)

## 2013-09-09 NOTE — Progress Notes (Signed)
PULMONARY  / CRITICAL CARE MEDICINE  Name: Jason Harmon MRN: [id number] DOB: 10-Sep-1940  CONSULTATION DATE: 09/05/2013  BRIEF PATIENT DESCRIPTION: 73 yo who initially presented to an outside facility on 1/9 with SBO / ischemic bowel requiring laparotomy and small bowel resection. His course was complicated by ARF, now on dialysis and respiratory failure resulting in tracheostomy and prolonged mechanical ventilation.  CULTURES: Recent Results (from the past 720 hour(s))  CULTURE, BLOOD (ROUTINE X 2)     Status: None   Collection Time    08/17/13 11:25 AM      Result Value Ref Range Status   Specimen Description BLOOD RIGHT HAND   Final   Special Requests BOTTLES DRAWN AEROBIC ONLY [facility]   Final   Culture  Setup Time     Final   Value: 08/17/2013 16:41     Performed at [facility]   Culture     Final   Value: NO GROWTH 5 DAYS     Performed at [facility]   Report Status 08/23/2013 FINAL   Final  CULTURE, BLOOD (ROUTINE X 2)     Status: None   Collection Time    08/17/13 11:30 AM      Result Value Ref Range Status   Specimen Description BLOOD LEFT HAND   Final   Special Requests BOTTLES DRAWN AEROBIC ONLY 4CC   Final   Culture  Setup Time     Final   Value: 08/17/2013 16:41     Performed at [facility]   Culture     Final   Value: NO GROWTH 5 DAYS     Performed at [facility]   Report Status 08/23/2013 FINAL   Final  MRSA PCR SCREENING     Status: None   Collection Time    08/18/13 10:05 AM      Result Value Ref Range Status   MRSA by PCR NEGATIVE  NEGATIVE Final   Comment:            The GeneXpert MRSA Assay (FDA     approved for NASAL specimens     only), is one component of a     comprehensive MRSA colonization     surveillance program. It is not     intended to diagnose MRSA     infection nor to guide or     monitor treatment for     MRSA infections.  CULTURE, RESPIRATORY (NON-EXPECTORATED)     Status: None   Collection Time     08/26/13  5:48 PM      Result Value Ref Range Status   Specimen Description TRACHEAL ASPIRATE   Final   Special Requests NONE   Final   Gram Stain     Final   Value: ABUNDANT WBC PRESENT, PREDOMINANTLY PMN     MODERATE SQUAMOUS EPITHELIAL CELLS PRESENT     RARE GRAM POSITIVE COCCI     IN PAIRS     Performed at [facility]   Culture     Final   Value: MODERATE STENOTROPHOMONAS MALTOPHILIA     Performed at [facility]   Report Status 08/29/2013 FINAL   Final   Organism ID, Bacteria STENOTROPHOMONAS MALTOPHILIA   Final  CULTURE, EXPECTORATED SPUTUM-ASSESSMENT     Status: None   Collection Time    09/09/13  1:48 AM      Result Value Ref Range Status   Specimen Description SPUTUM   Final   Special  Requests NONE   Final   Sputum evaluation     Final   Value: THIS SPECIMEN IS ACCEPTABLE. RESPIRATORY CULTURE REPORT TO FOLLOW.   Report Status 09/09/2013 FINAL   Final   ANTIBIOTICS: Zosyn 2/1 >> 2/12  Vancomycin 2/9 >> 2/10  Linezolid 2/10 >> 2/12  Bactrim 2/12 >> stop date 2/27    HISTORY OF PRESENT ILLNESS:   73 yo male with renal failure on dialysis and multiple recent surgeries for ischemic bowel presenting from [facility] with rigid abdomen and CT demonstrating bowel perforation. He was evaluated by surgery, Dr. Donne Hazel and after discussion with family, the plan is for the patient to go to OR this morning for attempted bowel repair.  He has had a very complicated course over the past month when he initially presented to an OSH 1/9 with SBO/ischemic bowel requiring laparotomy and small bowel resection. His course was complicated by ARF, now on dialysis and respiratory failure resulting in tracheostomy and prolonged mechanical ventilation.     INTERVAL HISTORY:  FOB 2/19 with evidence for tracheal erosion and some bleeding.  No further hemoptysis noted 2/20   09/09/13: Full vent dependent. They still have some blood secretions occ. Through trach. Not following  commands. Family at bedside PHYSICAL EXAMINATION:  Vital signs: Reviewed  General:  Appears acutely ill, mechanically ventilated, synchronous Neuro:drowsy HEENT:  PERRL,trach-> vent Cardiovascular:  RRR,  Lungs:  Bilateral diminished air entry, coarse rhonchi bilaterally.   Abdomen:  Dressing intact, TF + tna going Musculoskeletal:  Moves all extremities, no edema Skin:  Intact except for abd.  LABS: PULMONARY  Recent Labs Lab 09/04/13 1614 09/07/13 1532  PHART 7.326* 7.362  PCO2ART 41.0 39.2  PO2ART 101.0* 81.0  HCO3 20.8 21.5  TCO2 22.0 22.7  O2SAT 97.5 96.5    CBC  Recent Labs Lab 09/05/13 0436 09/07/13 0948 09/09/13 1122  HGB 8.4* 7.7* 6.5*  HCT 25.2* 23.8* 21.5*  WBC 13.5* 11.2* 10.9*  PLT 217 208 197    COAGULATION No results found for this basename: INR,  in the last 168 hours  CARDIAC  No results found for this basename: TROPONINI,  in the last 168 hours No results found for this basename: PROBNP,  in the last 168 hours   CHEMISTRY  Recent Labs Lab 09/03/13 0430 09/04/13 0400 09/05/13 0436 09/07/13 0948 09/09/13 0630  NA 144 142 143 146 148*  K 3.0* 3.2* 4.0 4.9 5.9*  CL 102 104 106 112 114*  CO2 24 23 20 21 22   GLUCOSE 138* 145* 145* 167* 124*  BUN 96* 91* 83* 75* 79*  CREATININE 2.61* 2.34* 2.14* 1.94* 2.08*  CALCIUM 7.4* 7.8* 7.9* 7.8* 7.7*  PHOS 3.5  --   --   --   --    The CrCl is unknown because both a height and weight (above a minimum accepted value) are required for this calculation.   LIVER  Recent Labs Lab 09/04/13 0400 09/05/13 0436  AST 40* 39*  ALT 49 50  ALKPHOS 253* 265*  BILITOT 1.0 1.0  PROT 5.7* 5.6*  ALBUMIN 1.6* 1.5*     INFECTIOUS  Recent Labs Lab 09/05/13 0436  PROCALCITON 0.44     ENDOCRINE CBG (last 3)  No results found for this basename: GLUCAP,  in the last 72 hours       IMAGING x48h  Dg Chest Port 1 View  09/09/2013   CLINICAL DATA:  Endotracheal tube  EXAM: PORTABLE CHEST  - 1 VIEW  COMPARISON:  DG CHEST 1V PORT dated 09/07/2013; DG CHEST 1V PORT dated 08/31/2013; DG CHEST 1V PORT dated 08/29/2013; CT CHEST W/O CM dated 08/29/2013  FINDINGS: Grossly unchanged enlarged cardiac silhouette and mediastinal contours given persistently reduced lung volumes. There has been apparent interval attempted advancement of the left jugular central venous catheter with mid aspect now coiled within the left subclavian vein. No pneumothorax. Pulmonary vasculature remains indistinct. Grossly unchanged small bilateral effusions and associated bilateral mid and lower lung heterogeneous / consolidative airspace opacities. Unchanged bones.  IMPRESSION: 1. Apparent attempted interval advancement of left jugular approach central venous catheter with mid aspect now coiled overlying the expected location of the left subclavian vein. Repositioning is advised. 2. Otherwise, stable positioning of support apparatus. No pneumothorax. 3. Grossly unchanged findings of pulmonary edema, small bilateral effusions and extensive bilateral mid and lower lung predominant airspace opacities with differential considerations including (but not limited to) atelectasis, multifocal infection, alveolar pulmonary edema and ARDS.   Electronically Signed   By: Sandi Mariscal M.D.   On: 09/09/2013 07:37   Dg Chest Port 1 View  09/07/2013   CLINICAL DATA:  Chronic ventilator dependent respiratory failure. Followup severe bilateral edema and/or pneumonia.  EXAM: PORTABLE CHEST - 1 VIEW  COMPARISON:  DG CHEST 1V PORT dated 09/05/2013; DG CHEST 1V PORT dated 09/03/2013; DG CHEST 1V PORT dated 09/01/2013; DG CHEST 1V PORT dated 08/31/2013  FINDINGS: Tracheostomy tube tip in satisfactory position below thoracic inlet. Left jugular central venous catheter tip has withdrawn slightly into the expected location of the junction of the jugular vein and innominate vein. Airspace consolidation throughout the mid and lower lungs, minimally improved since the  examination 2 days ago, though severe opacities persist. These are most confluent in the right lower lobe, with silhouetting of the right hemidiaphragm. Mild pulmonary venous hypertension without overt interstitial edema.  IMPRESSION: 1. Minimal improvement in the still severe pneumonia involving both lungs. 2. Left jugular central venous catheter has withdrawn slightly and has its tip at the expected location of the junction of the left jugular vein and innominate vein.   Electronically Signed   By: Evangeline Dakin M.D.   On: 09/07/2013 16:32       ASSESSMENT  Acute and chronic respiratory failure Pneumonia with STENOTROPHOMONAS MALTOPHILIA ARDS +/- cardiogenic pulmonary edema Atrial fibrillation Tracheostomy status   Hemoptysis, likely due to tracheal erosion, ? Suctioning  PLAN Minimize suctioning DC Subclavian line Goal pH>7.30, SpO2>92 Continuous mechanical support Increase PEEP + 8 VAP bundle Trend ABG/CXR Hold weaning until oxygen requirements improve Aim for neutral to negative fluid balance  Antibiotics per primary team   Dr. Brand Males, M.D., [facility].C.P Pulmonary and Critical Care Medicine Staff Physician [facility] Pager: [phone], If no answer or between  15:00h - 7:00h: call [phone]  09/09/2013 12:37 PM

## 2013-09-10 LAB — BLOOD GAS, ARTERIAL
Acid-base deficit: 3.5 mmol/L — ABNORMAL HIGH (ref 0.0–2.0)
Acid-base deficit: 4.4 mmol/L — ABNORMAL HIGH (ref 0.0–2.0)
BICARBONATE: 22.1 meq/L (ref 20.0–24.0)
Bicarbonate: 22 mEq/L (ref 20.0–24.0)
FIO2: 0.4 %
FIO2: 0.4 %
LHR: 20 {breaths}/min
MECHVT: 450 mL
O2 SAT: 99.1 %
O2 SAT: 98.1 %
PCO2 ART: 46.8 mmHg — AB (ref 35.0–45.0)
PEEP/CPAP: 8 cmH2O
PEEP/CPAP: 8 cmH2O
PH ART: 7.221 — AB (ref 7.350–7.450)
PO2 ART: 98.5 mmHg (ref 80.0–100.0)
PO2 ART: 87.1 mmHg (ref 80.0–100.0)
Patient temperature: 98.6
Patient temperature: 98.6
Pressure control: 30 cmH2O
RATE: 20 resp/min
TCO2: 23.4 mmol/L (ref 0–100)
TCO2: 23.8 mmol/L (ref 0–100)
pCO2 arterial: 56 mmHg — ABNORMAL HIGH (ref 35.0–45.0)
pH, Arterial: 7.293 — ABNORMAL LOW (ref 7.350–7.450)

## 2013-09-10 LAB — BASIC METABOLIC PANEL
BUN: 78 mg/dL — ABNORMAL HIGH (ref 6–23)
CO2: 23 mEq/L (ref 19–32)
Calcium: 7.7 mg/dL — ABNORMAL LOW (ref 8.4–10.5)
Chloride: 119 mEq/L — ABNORMAL HIGH (ref 96–112)
Creatinine, Ser: 1.9 mg/dL — ABNORMAL HIGH (ref 0.50–1.35)
GFR calc Af Amer: 40 mL/min — ABNORMAL LOW (ref >90)
GFR calc non Af Amer: 34 mL/min — ABNORMAL LOW (ref >90)
Glucose, Bld: 174 mg/dL — ABNORMAL HIGH (ref 70–99)
Potassium: 4.8 mEq/L (ref 3.7–5.3)
Sodium: 152 mEq/L — ABNORMAL HIGH (ref 137–147)

## 2013-09-10 LAB — CBC WITH DIFFERENTIAL/PLATELET
BASOS ABS: 0 10*3/uL (ref 0.0–0.1)
Basophils Relative: 0 % (ref 0–1)
EOS ABS: 0.1 10*3/uL (ref 0.0–0.7)
Eosinophils Relative: 1 % (ref 0–5)
HEMATOCRIT: 27.5 % — AB (ref 39.0–52.0)
Hemoglobin: 8.8 g/dL — ABNORMAL LOW (ref 13.0–17.0)
LYMPHS PCT: 14 % (ref 12–46)
Lymphs Abs: 1.7 10*3/uL (ref 0.7–4.0)
MCH: 30.8 pg (ref 26.0–34.0)
MCHC: 32 g/dL (ref 30.0–36.0)
MCV: 96.2 fL (ref 78.0–100.0)
MONOS PCT: 3 % (ref 3–12)
Monocytes Absolute: 0.4 10*3/uL (ref 0.1–1.0)
Neutro Abs: 9.8 10*3/uL — ABNORMAL HIGH (ref 1.7–7.7)
Neutrophils Relative %: 82 % — ABNORMAL HIGH (ref 43–77)
Platelets: 206 10*3/uL (ref 150–400)
RBC: 2.86 MIL/uL — ABNORMAL LOW (ref 4.22–5.81)
RDW: 18.6 % — AB (ref 11.5–15.5)
WBC: 12 10*3/uL — AB (ref 4.0–10.5)

## 2013-09-10 LAB — URINE CULTURE
Colony Count: NO GROWTH
Culture: NO GROWTH

## 2013-09-10 LAB — PRO B NATRIURETIC PEPTIDE: Pro B Natriuretic peptide (BNP): 6160 pg/mL — ABNORMAL HIGH (ref 0–125)

## 2013-09-10 LAB — LACTIC ACID, PLASMA: LACTIC ACID, VENOUS: 1.2 mmol/L (ref 0.5–2.2)

## 2013-09-10 LAB — PROCALCITONIN: PROCALCITONIN: 0.24 ng/mL

## 2013-09-10 LAB — PREPARE RBC (CROSSMATCH)

## 2013-09-10 NOTE — Progress Notes (Signed)
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 His course was complicated by ARF, now on dialysis and respiratory failure resulting in tracheostomy and prolonged mechanical ventilation.  Has required multiple surgeries for ischemic bowel with perforation.    CULTURES: BCx2 2/23>>> PICC Tip 2/23>>> Tracheal Aspirate 2/23>>>  ANTIBIOTICS: Zosyn 2/1 >> 2/12  Vancomycin 2/9 >> 2/10  Linezolid 2/10 >> 2/12  Bactrim 2/12 >> stop date 2/27  .....................................................Marland Kitchen Vanco 2/23>>> Meropenem 2/23>>>  KEY EVENTS  2/19 - FOB with evidence of tracheal erosion & bleeding.   2/20 - no further hemoptysis  2/23 - Full vent dependent. They still have some blood secretions occ. Through trach. Not following commands.    STUDIES:  2/23 - CT ABD/Pelvis>>s/p colostomy without acute findings, small volume ascites, decompressed bowel, mild mesenteric & soft tissue edema, likely reflective of anasarca 2/23 - CT Chest>>>mod B effusions, consolidation, no ptx  INTERVAL HISTORY: No bleeding from trach.  Tachypnea in 30's (RN reports unchanged).     PHYSICAL EXAMINATION: Vital signs: Reviewed, abnormal findings will be addressed in assessment / plan.    General: chronically ill on vent, appears uncomfortable Neuro: drowsy, opens eyes, grimaces to pain HEENT:  PERRL, #8 trach c/d/i, no bleeding Cardiovascular:  RRR Lungs:  Bilateral diminished air entry, coarse rhonchi bilaterally.   Abdomen:  Dressing intact, large, soft Musculoskeletal:  Moves all extremities, no edema Skin:  Intact except for abd.  LABS: PULMONARY  Recent Labs Lab 09/04/13 1614 09/07/13 1532 09/10/13 0500  PHART 7.326* 7.362 7.293*  PCO2ART 41.0 39.2 46.8*   PO2ART 101.0* 81.0 98.5  HCO3 20.8 21.5 22.0  TCO2 22.0 22.7 23.4  O2SAT 97.5 96.5 99.1   CBC  Recent Labs Lab 09/05/13 0436 09/07/13 0948 09/09/13 1122  HGB 8.4* 7.7* 6.5*  HCT 25.2* 23.8* 21.5*  WBC 13.5* 11.2* 10.9*  PLT 217 208 197   COAGULATION No results found for this basename: INR,  in the last 168 hours  CARDIAC  No results found for this basename: TROPONINI,  in the last 168 hours No results found for this basename: PROBNP,  in the last 168 hours  CHEMISTRY  Recent Labs Lab 09/04/13 0400 09/05/13 0436 09/07/13 0948 09/09/13 0630  NA 142 143 146 148*  K 3.2* 4.0 4.9 5.9*  CL 104 106 112 114*  CO2 23 20 21 22   GLUCOSE 145* 145* 167* 124*  BUN 91* 83* 75* 79*  CREATININE 2.34* 2.14* 1.94* 2.08*  CALCIUM 7.8* 7.9* 7.8* 7.7*   The CrCl is unknown because both a height and weight (above a minimum accepted value) are required for this calculation.  LIVER  Recent Labs Lab 09/04/13 0400 09/05/13 0436  AST 40* 39*  ALT 49 50  ALKPHOS 253* 265*  BILITOT 1.0 1.0  PROT 5.7* 5.6*  ALBUMIN 1.6* 1.5*   INFECTIOUS  Recent Labs Lab 09/05/13 0436  PROCALCITON 0.44   IMAGING x48h  Ct Abdomen Pelvis Wo Contrast  09/09/2013   CLINICAL DATA:  Code sepsis.  Assess for pneumonia.  EXAM: CT CHEST, ABDOMEN AND PELVIS WITHOUT CONTRAST  TECHNIQUE: Multidetector CT imaging of the chest, abdomen and pelvis was performed following the standard protocol without IV contrast.  COMPARISON:  CT of the chest performed 08/29/2013, and CT of the abdomen and pelvis  performed 08/09/2003  FINDINGS: CT CHEST FINDINGS  Moderate bilateral pleural effusions are seen, with consolidation of both lower lung lobes. Additional patchy airspace opacities are seen within the expanded portions of both lungs. Findings are suspicious for diffuse multifocal pneumonia, though superimposed edema cannot be excluded. No pneumothorax is seen.  The patient's tracheostomy tube is seen ending 5 cm above  the carina. Scattered coronary artery calcifications are seen. Scattered precarinal, aortopulmonary window and periaortic nodes remain borderline normal in size. No definite mediastinal lymphadenopathy is seen. No pericardial effusion is identified. The great vessels are grossly unremarkable in appearance. The thyroid gland is grossly unremarkable. No axillary lymphadenopathy is seen.  No acute osseous abnormalities are identified.  CT ABDOMEN AND PELVIS FINDINGS  Trace ascites is noted surrounding the liver and spleen, with small volume ascites tracking along both paracolic gutters into the pelvis.  The liver and spleen are unremarkable in appearance. Increased density within the gallbladder may reflect remote vicarious excretion of contrast. The gallbladder is otherwise unremarkable in appearance. The pancreas and adrenal glands are unremarkable.  Nonspecific perinephric stranding is noted bilaterally. A 2.1 cm cyst is noted arising at the anterior aspect of the right kidney. The kidneys are otherwise unremarkable in appearance. There is no evidence hydronephrosis. No renal or ureteral stones are seen.  The patient is status post resection of much of the small bowel. The anastomoses are difficult to fully assess due to decompression. No definite focal abnormalities are seen. There is mild diffuse mesenteric edema, more prominent than on the prior study.  The stomach is within normal limits; an associated G-tube is noted. No acute vascular abnormalities are seen.  The appendix is difficult to fully characterize but appears grossly unremarkable. There is no evidence for appendicitis. The colon is partially filled with fluid, with a colostomy seen at the left lower quadrant. The colostomy is unremarkable in appearance. The visualized Hartmann's pouch contains residual contrast and is otherwise unremarkable in appearance.  The bladder is decompressed, with a Foley catheter in place. The prostate is mildly enlarged,  measuring 5.3 cm in transverse dimension. No inguinal lymphadenopathy is seen.  Mild diffuse soft tissue edema likely reflects mild anasarca.  No acute osseous abnormalities are identified.  IMPRESSION: 1. Status post colostomy; the colostomy is unremarkable in appearance. 2. Moderate bilateral pleural effusions have increased from the prior study, with consolidation of both lower lung lobes. The degree of diffuse airspace opacification appears mildly improved. Findings are suspicious for persistent diffuse multifocal pneumonia, though superimposed edema cannot be excluded. 3. Small volume ascites noted within the lower abdomen and pelvis, and trace ascites surrounding the liver and spleen. 4. Scattered coronary artery calcifications noted. 5. Status post resection of much of the small bowel; anastomoses are difficult to fully assess due to bowel decompression. 6. Mildly enlarged prostate. 7. Mild diffuse mesenteric edema noted; mild diffuse soft tissue edema likely reflects mild anasarca.   Electronically Signed   By: Garald Balding M.D.   On: 09/09/2013 23:25   Ct Chest Wo Contrast  09/09/2013   CLINICAL DATA:  Code sepsis.  Assess for pneumonia.  EXAM: CT CHEST, ABDOMEN AND PELVIS WITHOUT CONTRAST  TECHNIQUE: Multidetector CT imaging of the chest, abdomen and pelvis was performed following the standard protocol without IV contrast.  COMPARISON:  CT of the chest performed 08/29/2013, and CT of the abdomen and pelvis performed 08/09/2003  FINDINGS: CT CHEST FINDINGS  Moderate bilateral pleural effusions are seen, with consolidation of both lower lung lobes.  Additional patchy airspace opacities are seen within the expanded portions of both lungs. Findings are suspicious for diffuse multifocal pneumonia, though superimposed edema cannot be excluded. No pneumothorax is seen.  The patient's tracheostomy tube is seen ending 5 cm above the carina. Scattered coronary artery calcifications are seen. Scattered  precarinal, aortopulmonary window and periaortic nodes remain borderline normal in size. No definite mediastinal lymphadenopathy is seen. No pericardial effusion is identified. The great vessels are grossly unremarkable in appearance. The thyroid gland is grossly unremarkable. No axillary lymphadenopathy is seen.  No acute osseous abnormalities are identified.  CT ABDOMEN AND PELVIS FINDINGS  Trace ascites is noted surrounding the liver and spleen, with small volume ascites tracking along both paracolic gutters into the pelvis.  The liver and spleen are unremarkable in appearance. Increased density within the gallbladder may reflect remote vicarious excretion of contrast. The gallbladder is otherwise unremarkable in appearance. The pancreas and adrenal glands are unremarkable.  Nonspecific perinephric stranding is noted bilaterally. A 2.1 cm cyst is noted arising at the anterior aspect of the right kidney. The kidneys are otherwise unremarkable in appearance. There is no evidence hydronephrosis. No renal or ureteral stones are seen.  The patient is status post resection of much of the small bowel. The anastomoses are difficult to fully assess due to decompression. No definite focal abnormalities are seen. There is mild diffuse mesenteric edema, more prominent than on the prior study.  The stomach is within normal limits; an associated G-tube is noted. No acute vascular abnormalities are seen.  The appendix is difficult to fully characterize but appears grossly unremarkable. There is no evidence for appendicitis. The colon is partially filled with fluid, with a colostomy seen at the left lower quadrant. The colostomy is unremarkable in appearance. The visualized Hartmann's pouch contains residual contrast and is otherwise unremarkable in appearance.  The bladder is decompressed, with a Foley catheter in place. The prostate is mildly enlarged, measuring 5.3 cm in transverse dimension. No inguinal lymphadenopathy is  seen.  Mild diffuse soft tissue edema likely reflects mild anasarca.  No acute osseous abnormalities are identified.  IMPRESSION: 1. Status post colostomy; the colostomy is unremarkable in appearance. 2. Moderate bilateral pleural effusions have increased from the prior study, with consolidation of both lower lung lobes. The degree of diffuse airspace opacification appears mildly improved. Findings are suspicious for persistent diffuse multifocal pneumonia, though superimposed edema cannot be excluded. 3. Small volume ascites noted within the lower abdomen and pelvis, and trace ascites surrounding the liver and spleen. 4. Scattered coronary artery calcifications noted. 5. Status post resection of much of the small bowel; anastomoses are difficult to fully assess due to bowel decompression. 6. Mildly enlarged prostate. 7. Mild diffuse mesenteric edema noted; mild diffuse soft tissue edema likely reflects mild anasarca.   Electronically Signed   By: Garald Balding M.D.   On: 09/09/2013 23:25   Dg Chest Port 1 View  09/09/2013   CLINICAL DATA:  Central line placement.  EXAM: PORTABLE CHEST - 1 VIEW  COMPARISON:  09/09/2013 and prior chest radiographs  FINDINGS: Cardiomegaly, tracheostomy tube and diffuse bilateral airspace opacities/edema and bilateral effusions again noted.  There has been interval placement of a right IJ central venous catheter with tip overlying the lower SVC. There is no evidence of pneumothorax.  A left IJ central venous catheter has been removed.  IMPRESSION: Right IJ central venous catheter placement with tip overlying the lower SVC - no evidence of pneumothorax.  Unchanged bilateral airspace opacities/edema  and bilateral effusions.   Electronically Signed   By: Hassan Rowan M.D.   On: 09/09/2013 19:30   Dg Chest Port 1 View  09/09/2013   CLINICAL DATA:  Endotracheal tube  EXAM: PORTABLE CHEST - 1 VIEW  COMPARISON:  DG CHEST 1V PORT dated 09/07/2013; DG CHEST 1V PORT dated 08/31/2013; DG CHEST  1V PORT dated 08/29/2013; CT CHEST W/O CM dated 08/29/2013  FINDINGS: Grossly unchanged enlarged cardiac silhouette and mediastinal contours given persistently reduced lung volumes. There has been apparent interval attempted advancement of the left jugular central venous catheter with mid aspect now coiled within the left subclavian vein. No pneumothorax. Pulmonary vasculature remains indistinct. Grossly unchanged small bilateral effusions and associated bilateral mid and lower lung heterogeneous / consolidative airspace opacities. Unchanged bones.  IMPRESSION: 1. Apparent attempted interval advancement of left jugular approach central venous catheter with mid aspect now coiled overlying the expected location of the left subclavian vein. Repositioning is advised. 2. Otherwise, stable positioning of support apparatus. No pneumothorax. 3. Grossly unchanged findings of pulmonary edema, small bilateral effusions and extensive bilateral mid and lower lung predominant airspace opacities with differential considerations including (but not limited to) atelectasis, multifocal infection, alveolar pulmonary edema and ARDS.   Electronically Signed   By: Sandi Mariscal M.D.   On: 09/09/2013 07:37    ASSESSMENT  Acute and chronic respiratory failure - multifactorial in setting of HCAP, abdominal surgeries, small pleural effusions, atelectasis, anemia and deconditioning Pneumonia - positive for Stenotrophomonas Maltophilia  ARDS +/- cardiogenic pulmonary edema Atrial fibrillation Tracheostomy status   Hemoptysis - likely due to tracheal erosion vs suction trauma.  Resolved 2/24 Renal Failure  Anemia - in setting of critical illness, phlebotomy and trach site bleeding S/P Colectomy  PLAN: -Minimize suctioning -Continuous mechanical support -Continue PEEP of 8, CHANGE TO PVC -Trend ABG/CXR -Hold weaning until oxygen requirements improve -Aim for neutral to negative fluid balance  -Antibiotics per primary  team -PRBC's for anemia with 20 mg lasix in between; limit to 1 unit prbc each time. Goal > 7gm% -assess cultures as above -now PCT, lactic acid, EKG, troponin to ensure no new cardiac event -PRN fentanyl for pain -mobilize as able, upright positioning    Noe Gens, NP-C [facility] Pgr: [phone] or [phone]  09/10/2013 10:03 AM   STAFF NOTE: I, Dr Ann Lions have personally reviewed patient's available data, including medical history, events of note, physical examination and test results as part of my evaluation. I have discussed with resident/NP and other care providers such as pharmacist, RN and RRT.  In addition,  I personally evaluated patient and elicited key findings of significant resp distress and vent dysnnchrony. He improved after fentanyl but FLow-Time curve still suggests poor compliance and high peak pressures as a result (Pip 50, Plat 30). This on AC mode. He tolerated better after changnig to PCV.   Rest per NP/medical resident whose note is outlined above and that I agree with  The patient is critically ill with multiple organ systems failure and requires high complexity decision making for assessment and support, frequent evaluation and titration of therapies, application of advanced monitoring technologies and extensive interpretation of multiple databases.   Critical Care Time devoted to patient care services described in this note is 35  Minutes.  Dr. Brand Males, M.D., [facility].C.P Pulmonary and Critical Care Medicine Staff Physician [facility] Pager: [phone], If no answer or between  15:00h - 7:00h:  call [phone]  09/10/2013 12:42 PM

## 2013-09-11 ENCOUNTER — Other Ambulatory Visit (HOSPITAL_COMMUNITY): Payer: Self-pay

## 2013-09-11 DIAGNOSIS — J988 Other specified respiratory disorders: Secondary | ICD-10-CM | POA: Diagnosis not present

## 2013-09-11 DIAGNOSIS — Z930 Tracheostomy status: Secondary | ICD-10-CM | POA: Diagnosis not present

## 2013-09-11 DIAGNOSIS — J9600 Acute respiratory failure, unspecified whether with hypoxia or hypercapnia: Secondary | ICD-10-CM

## 2013-09-11 DIAGNOSIS — J90 Pleural effusion, not elsewhere classified: Secondary | ICD-10-CM

## 2013-09-11 LAB — LACTATE DEHYDROGENASE, PLEURAL OR PERITONEAL FLUID: LD FL: 138 U/L — AB (ref 3–23)

## 2013-09-11 LAB — BLOOD GAS, ARTERIAL
Acid-base deficit: 4.7 mmol/L — ABNORMAL HIGH (ref 0.0–2.0)
Bicarbonate: 21.7 mEq/L (ref 20.0–24.0)
FIO2: 0.4 %
O2 Saturation: 95.7 %
PEEP: 8 cmH2O
PH ART: 7.235 — AB (ref 7.350–7.450)
PO2 ART: 84 mmHg (ref 80.0–100.0)
PRESSURE CONTROL: 32 cmH2O
Patient temperature: 98.6
RATE: 20 resp/min
TCO2: 23.3 mmol/L (ref 0–100)
pCO2 arterial: 53.1 mmHg — ABNORMAL HIGH (ref 35.0–45.0)

## 2013-09-11 LAB — BODY FLUID CELL COUNT WITH DIFFERENTIAL
Lymphs, Fluid: 8 %
Monocyte-Macrophage-Serous Fluid: 39 % — ABNORMAL LOW (ref 50–90)
Neutrophil Count, Fluid: 53 % — ABNORMAL HIGH (ref 0–25)
Total Nucleated Cell Count, Fluid: 133 cu mm (ref 0–1000)

## 2013-09-11 LAB — PH, BODY FLUID: PH, FLUID: 7.5

## 2013-09-11 LAB — BASIC METABOLIC PANEL
BUN: 71 mg/dL — ABNORMAL HIGH (ref 6–23)
CO2: 23 meq/L (ref 19–32)
Calcium: 7.9 mg/dL — ABNORMAL LOW (ref 8.4–10.5)
Chloride: 118 mEq/L — ABNORMAL HIGH (ref 96–112)
Creatinine, Ser: 1.72 mg/dL — ABNORMAL HIGH (ref 0.50–1.35)
GFR calc non Af Amer: 39 mL/min — ABNORMAL LOW (ref >90)
GFR, EST AFRICAN AMERICAN: 45 mL/min — AB (ref >90)
Glucose, Bld: 130 mg/dL — ABNORMAL HIGH (ref 70–99)
POTASSIUM: 4.7 meq/L (ref 3.7–5.3)
Sodium: 155 mEq/L — ABNORMAL HIGH (ref 137–147)

## 2013-09-11 LAB — PROTEIN, BODY FLUID: TOTAL PROTEIN, FLUID: 1.3 g/dL

## 2013-09-11 LAB — CULTURE, RESPIRATORY W GRAM STAIN

## 2013-09-11 LAB — CULTURE, RESPIRATORY: Culture: NORMAL

## 2013-09-11 NOTE — Progress Notes (Signed)
PULMONARY  / CRITICAL CARE MEDICINE  Name: Jason Harmon MRN: [id number] DOB: 28-May-1941  CONSULTATION DATE: 09/05/2013  BRIEF PATIENT DESCRIPTION: 73 y/o who initially presented to an outside facility on 1/9 with SBO / ischemic bowel requiring laparotomy and small bowel resection. His course was complicated by ARF, now on dialysis and respiratory failure resulting in tracheostomy and prolonged mechanical ventilation.  Has required multiple surgeries for ischemic bowel with perforation.    CULTURES: BCx2 2/23>>> PICC Tip 2/23>>> Tracheal Aspirate 2/23>>>neg UC 2/23>>>neg  ANTIBIOTICS: Zosyn 2/1 >> 2/12  Vancomycin 2/9 >> 2/10  Linezolid 2/10 >> 2/12  Bactrim 2/12 >> stop date 2/27  .....................................................Marland Kitchen Vanco 2/23>>> Meropenem 2/23>>>  KEY EVENTS  2/19 - FOB with evidence of tracheal erosion & bleeding.   2/20 - no further hemoptysis  2/23 - Full vent dependent. They still have some blood secretions occ. Through trach. Not following commands.  2/24 - PCT 0.24, lactic acid 1.2 2/25 - on PCV, not weaning  STUDIES:  2/23 - CT ABD/Pelvis>>s/p colostomy without acute findings, small volume ascites, decompressed bowel, mild mesenteric & soft tissue edema, likely reflective of anasarca 2/23 - CT Chest>>>mod B effusions, consolidation, no ptx  INTERVAL HISTORY: No bleeding from trach.  Tachypnea in 30's (RN reports unchanged).     PHYSICAL EXAMINATION: Vital signs: Reviewed, abnormal findings will be addressed in assessment / plan.    General: chronically ill on vent, appears uncomfortable Neuro: drowsy, opens eyes, grimaces to pain HEENT:  PERRL, #8 trach c/d/i, no bleeding Cardiovascular:  RRR Lungs:  Bilateral diminished air entry, coarse rhonchi bilaterally.   Abdomen:  Dressing intact, large, soft Musculoskeletal:  Moves all extremities, no edema Skin:  Intact except for abd.  LABS: PULMONARY  Recent Labs Lab 09/04/13 1614  09/07/13 1532 09/10/13 0500 09/10/13 1226 09/11/13 0520  PHART 7.326* 7.362 7.293* 7.221* 7.235*  PCO2ART 41.0 39.2 46.8* 56.0* 53.1*  PO2ART 101.0* 81.0 98.5 87.1 84.0  HCO3 20.8 21.5 22.0 22.1 21.7  TCO2 22.0 22.7 23.4 23.8 23.3  O2SAT 97.5 96.5 99.1 98.1 95.7   CBC  Recent Labs Lab 09/07/13 0948 09/09/13 1122 09/10/13 1515  HGB 7.7* 6.5* 8.8*  HCT 23.8* 21.5* 27.5*  WBC 11.2* 10.9* 12.0*  PLT 208 197 206   CARDIAC  No results found for this basename: TROPONINI,  in the last 168 hours  Recent Labs Lab 09/10/13 0500  PROBNP 6160.0*   CHEMISTRY  Recent Labs Lab 09/05/13 0436 09/07/13 0948 09/09/13 0630 09/10/13 0500 09/11/13 0530  NA 143 146 148* 152* 155*  K 4.0 4.9 5.9* 4.8 4.7  CL 106 112 114* 119* 118*  CO2 20 21 22 23 23   GLUCOSE 145* 167* 124* 174* 130*  BUN 83* 75* 79* 78* 71*  CREATININE 2.14* 1.94* 2.08* 1.90* 1.72*  CALCIUM 7.9* 7.8* 7.7* 7.7* 7.9*   The CrCl is unknown because both a height and weight (above a minimum accepted value) are required for this calculation.  LIVER  Recent Labs Lab 09/05/13 0436  AST 39*  ALT 50  ALKPHOS 265*  BILITOT 1.0  PROT 5.6*  ALBUMIN 1.5*   INFECTIOUS  Recent Labs Lab 09/05/13 0436 09/10/13 1205 09/10/13 1500  LATICACIDVEN  --  1.2  --   PROCALCITON 0.44  --  0.24   IMAGING x48h  Ct Abdomen Pelvis Wo Contrast  09/09/2013   CLINICAL DATA:  Code sepsis.  Assess for pneumonia.  EXAM: CT CHEST, ABDOMEN AND PELVIS WITHOUT CONTRAST  TECHNIQUE: Multidetector CT imaging  of the chest, abdomen and pelvis was performed following the standard protocol without IV contrast.  COMPARISON:  CT of the chest performed 08/29/2013, and CT of the abdomen and pelvis performed 08/09/2003  FINDINGS: CT CHEST FINDINGS  Moderate bilateral pleural effusions are seen, with consolidation of both lower lung lobes. Additional patchy airspace opacities are seen within the expanded portions of both lungs. Findings are suspicious  for diffuse multifocal pneumonia, though superimposed edema cannot be excluded. No pneumothorax is seen.  The patient's tracheostomy tube is seen ending 5 cm above the carina. Scattered coronary artery calcifications are seen. Scattered precarinal, aortopulmonary window and periaortic nodes remain borderline normal in size. No definite mediastinal lymphadenopathy is seen. No pericardial effusion is identified. The great vessels are grossly unremarkable in appearance. The thyroid gland is grossly unremarkable. No axillary lymphadenopathy is seen.  No acute osseous abnormalities are identified.  CT ABDOMEN AND PELVIS FINDINGS  Trace ascites is noted surrounding the liver and spleen, with small volume ascites tracking along both paracolic gutters into the pelvis.  The liver and spleen are unremarkable in appearance. Increased density within the gallbladder may reflect remote vicarious excretion of contrast. The gallbladder is otherwise unremarkable in appearance. The pancreas and adrenal glands are unremarkable.  Nonspecific perinephric stranding is noted bilaterally. A 2.1 cm cyst is noted arising at the anterior aspect of the right kidney. The kidneys are otherwise unremarkable in appearance. There is no evidence hydronephrosis. No renal or ureteral stones are seen.  The patient is status post resection of much of the small bowel. The anastomoses are difficult to fully assess due to decompression. No definite focal abnormalities are seen. There is mild diffuse mesenteric edema, more prominent than on the prior study.  The stomach is within normal limits; an associated G-tube is noted. No acute vascular abnormalities are seen.  The appendix is difficult to fully characterize but appears grossly unremarkable. There is no evidence for appendicitis. The colon is partially filled with fluid, with a colostomy seen at the left lower quadrant. The colostomy is unremarkable in appearance. The visualized Hartmann's pouch  contains residual contrast and is otherwise unremarkable in appearance.  The bladder is decompressed, with a Foley catheter in place. The prostate is mildly enlarged, measuring 5.3 cm in transverse dimension. No inguinal lymphadenopathy is seen.  Mild diffuse soft tissue edema likely reflects mild anasarca.  No acute osseous abnormalities are identified.  IMPRESSION: 1. Status post colostomy; the colostomy is unremarkable in appearance. 2. Moderate bilateral pleural effusions have increased from the prior study, with consolidation of both lower lung lobes. The degree of diffuse airspace opacification appears mildly improved. Findings are suspicious for persistent diffuse multifocal pneumonia, though superimposed edema cannot be excluded. 3. Small volume ascites noted within the lower abdomen and pelvis, and trace ascites surrounding the liver and spleen. 4. Scattered coronary artery calcifications noted. 5. Status post resection of much of the small bowel; anastomoses are difficult to fully assess due to bowel decompression. 6. Mildly enlarged prostate. 7. Mild diffuse mesenteric edema noted; mild diffuse soft tissue edema likely reflects mild anasarca.   Electronically Signed   By: Garald Balding M.D.   On: 09/09/2013 23:25   Ct Chest Wo Contrast  09/09/2013   CLINICAL DATA:  Code sepsis.  Assess for pneumonia.  EXAM: CT CHEST, ABDOMEN AND PELVIS WITHOUT CONTRAST  TECHNIQUE: Multidetector CT imaging of the chest, abdomen and pelvis was performed following the standard protocol without IV contrast.  COMPARISON:  CT of the  chest performed 08/29/2013, and CT of the abdomen and pelvis performed 08/09/2003  FINDINGS: CT CHEST FINDINGS  Moderate bilateral pleural effusions are seen, with consolidation of both lower lung lobes. Additional patchy airspace opacities are seen within the expanded portions of both lungs. Findings are suspicious for diffuse multifocal pneumonia, though superimposed edema cannot be excluded.  No pneumothorax is seen.  The patient's tracheostomy tube is seen ending 5 cm above the carina. Scattered coronary artery calcifications are seen. Scattered precarinal, aortopulmonary window and periaortic nodes remain borderline normal in size. No definite mediastinal lymphadenopathy is seen. No pericardial effusion is identified. The great vessels are grossly unremarkable in appearance. The thyroid gland is grossly unremarkable. No axillary lymphadenopathy is seen.  No acute osseous abnormalities are identified.  CT ABDOMEN AND PELVIS FINDINGS  Trace ascites is noted surrounding the liver and spleen, with small volume ascites tracking along both paracolic gutters into the pelvis.  The liver and spleen are unremarkable in appearance. Increased density within the gallbladder may reflect remote vicarious excretion of contrast. The gallbladder is otherwise unremarkable in appearance. The pancreas and adrenal glands are unremarkable.  Nonspecific perinephric stranding is noted bilaterally. A 2.1 cm cyst is noted arising at the anterior aspect of the right kidney. The kidneys are otherwise unremarkable in appearance. There is no evidence hydronephrosis. No renal or ureteral stones are seen.  The patient is status post resection of much of the small bowel. The anastomoses are difficult to fully assess due to decompression. No definite focal abnormalities are seen. There is mild diffuse mesenteric edema, more prominent than on the prior study.  The stomach is within normal limits; an associated G-tube is noted. No acute vascular abnormalities are seen.  The appendix is difficult to fully characterize but appears grossly unremarkable. There is no evidence for appendicitis. The colon is partially filled with fluid, with a colostomy seen at the left lower quadrant. The colostomy is unremarkable in appearance. The visualized Hartmann's pouch contains residual contrast and is otherwise unremarkable in appearance.  The bladder  is decompressed, with a Foley catheter in place. The prostate is mildly enlarged, measuring 5.3 cm in transverse dimension. No inguinal lymphadenopathy is seen.  Mild diffuse soft tissue edema likely reflects mild anasarca.  No acute osseous abnormalities are identified.  IMPRESSION: 1. Status post colostomy; the colostomy is unremarkable in appearance. 2. Moderate bilateral pleural effusions have increased from the prior study, with consolidation of both lower lung lobes. The degree of diffuse airspace opacification appears mildly improved. Findings are suspicious for persistent diffuse multifocal pneumonia, though superimposed edema cannot be excluded. 3. Small volume ascites noted within the lower abdomen and pelvis, and trace ascites surrounding the liver and spleen. 4. Scattered coronary artery calcifications noted. 5. Status post resection of much of the small bowel; anastomoses are difficult to fully assess due to bowel decompression. 6. Mildly enlarged prostate. 7. Mild diffuse mesenteric edema noted; mild diffuse soft tissue edema likely reflects mild anasarca.   Electronically Signed   By: Garald Balding M.D.   On: 09/09/2013 23:25   Dg Chest Port 1 View  09/09/2013   CLINICAL DATA:  Central line placement.  EXAM: PORTABLE CHEST - 1 VIEW  COMPARISON:  09/09/2013 and prior chest radiographs  FINDINGS: Cardiomegaly, tracheostomy tube and diffuse bilateral airspace opacities/edema and bilateral effusions again noted.  There has been interval placement of a right IJ central venous catheter with tip overlying the lower SVC. There is no evidence of pneumothorax.  A left  IJ central venous catheter has been removed.  IMPRESSION: Right IJ central venous catheter placement with tip overlying the lower SVC - no evidence of pneumothorax.  Unchanged bilateral airspace opacities/edema and bilateral effusions.   Electronically Signed   By: Hassan Rowan M.D.   On: 09/09/2013 19:30    ASSESSMENT  Acute and chronic  respiratory failure - multifactorial in setting of HCAP, abdominal surgeries, small pleural effusions, pain, atelectasis, anemia and deconditioning Pneumonia - positive for Stenotrophomonas Maltophilia  ARDS +/- cardiogenic pulmonary edema Atrial fibrillation Tracheostomy status   Hemoptysis - likely due to tracheal erosion vs suction trauma.  Resolved 2/24 Renal Failure  Anemia - in setting of critical illness, phlebotomy and trach site bleeding S/P Colectomy  PLAN: -Minimize suctioning -Continuous mechanical support -Continue PEEP of 8, CHANGE TO PVC -Trend ABG/CXR -Hold weaning until oxygen requirements improve -Aim for neutral to negative fluid balance  -Antibiotics per primary team -PRBC's for anemia with 20 mg lasix in between; limit to 1 unit prbc each time. Goal > 7gm% -assess cultures as above -PRN fentanyl for pain -mobilize as able, upright positioning -will assess for thoracentesis pm of 2/25  Noe Gens, NP-C [facility] Pgr: [phone] or [phone]  09/11/2013 11:31 AM  Patient has deteriorated significantly compared to before.  Requiring PCV at this point.  Chest CT noted and patient has worsening bilateral pleural effusion.  Will perform thora on the left today and likely right in AM.  Family is unaware of how poorly the patient is doing and remain unrealistically optimistic.  Will be contacted when they arrive and will attempt to relay that message again today.  CC time 35 min.  Patient seen and examined, agree with above note.  I dictated the care and orders written for this patient under my direction.  Rush Farmer, MD [phone]

## 2013-09-12 ENCOUNTER — Other Ambulatory Visit (HOSPITAL_COMMUNITY): Payer: Self-pay

## 2013-09-12 LAB — CATH TIP CULTURE: Culture: NO GROWTH

## 2013-09-12 LAB — BLOOD GAS, ARTERIAL
ACID-BASE DEFICIT: 4.2 mmol/L — AB (ref 0.0–2.0)
Bicarbonate: 21.5 mEq/L (ref 20.0–24.0)
FIO2: 0.4 %
O2 Saturation: 97.5 %
PCO2 ART: 47.6 mmHg — AB (ref 35.0–45.0)
PEEP/CPAP: 8 cmH2O
PRESSURE CONTROL: 32 cmH2O
Patient temperature: 98.6
RATE: 20 resp/min
TCO2: 23 mmol/L (ref 0–100)
pH, Arterial: 7.277 — ABNORMAL LOW (ref 7.350–7.450)
pO2, Arterial: 95.6 mmHg (ref 80.0–100.0)

## 2013-09-12 LAB — VANCOMYCIN, TROUGH: VANCOMYCIN TR: 14.3 ug/mL (ref 10.0–20.0)

## 2013-09-12 LAB — PATHOLOGIST SMEAR REVIEW

## 2013-09-13 LAB — BASIC METABOLIC PANEL
BUN: 52 mg/dL — AB (ref 6–23)
CHLORIDE: 113 meq/L — AB (ref 96–112)
CO2: 22 mEq/L (ref 19–32)
Calcium: 7.7 mg/dL — ABNORMAL LOW (ref 8.4–10.5)
Creatinine, Ser: 1.6 mg/dL — ABNORMAL HIGH (ref 0.50–1.35)
GFR calc Af Amer: 49 mL/min — ABNORMAL LOW (ref >90)
GFR calc non Af Amer: 42 mL/min — ABNORMAL LOW (ref >90)
Glucose, Bld: 113 mg/dL — ABNORMAL HIGH (ref 70–99)
POTASSIUM: 4.4 meq/L (ref 3.7–5.3)
Sodium: 147 mEq/L (ref 137–147)

## 2013-09-13 LAB — TYPE AND SCREEN
ABO/RH(D): A POS
Antibody Screen: NEGATIVE
Unit division: 0
Unit division: 0
Unit division: 0

## 2013-09-13 LAB — BLOOD GAS, ARTERIAL
Acid-base deficit: 6.6 mmol/L — ABNORMAL HIGH (ref 0.0–2.0)
Bicarbonate: 19.7 mEq/L — ABNORMAL LOW (ref 20.0–24.0)
FIO2: 0.4 %
LHR: 16 {breaths}/min
O2 SAT: 99.8 %
PEEP: 5 cmH2O
Patient temperature: 98.6
TCO2: 21.2 mmol/L (ref 0–100)
VT: 450 mL
pCO2 arterial: 48.9 mmHg — ABNORMAL HIGH (ref 35.0–45.0)
pH, Arterial: 7.23 — ABNORMAL LOW (ref 7.350–7.450)
pO2, Arterial: 229 mmHg — ABNORMAL HIGH (ref 80.0–100.0)

## 2013-09-13 NOTE — Progress Notes (Signed)
PULMONARY  / CRITICAL CARE MEDICINE  Name: Jason Harmon MRN: [id number] DOB: 1941/02/20  CONSULTATION DATE: 09/05/2013  BRIEF PATIENT DESCRIPTION: 73 y/o who initially presented to an outside facility on 1/9 with SBO / ischemic bowel requiring laparotomy and small bowel resection. His course was complicated by ARF, now on dialysis and respiratory failure resulting in tracheostomy and prolonged mechanical ventilation.  Has required multiple surgeries for ischemic bowel with perforation.    CULTURES: BCx2 2/23>>> PICC Tip 2/23>>>neg Tracheal Aspirate 2/23>>>neg UC 2/23>>>neg  ANTIBIOTICS: Zosyn 2/1 >> 2/12  Vancomycin 2/9 >> 2/10  Linezolid 2/10 >> 2/12  Bactrim 2/12 >> stop date 2/27  .....................................................Marland Kitchen Vanco 2/23>>> Meropenem 2/23>>> .....................................................Marland Kitchen ABX per [facility] MD  KEY EVENTS  2/19 - FOB with evidence of tracheal erosion & bleeding.   2/20 - no further hemoptysis  2/23 - Full vent dependent. They still have some blood secretions occ. Through trach. Not following commands.  2/24 - PCT 0.24, lactic acid 1.2 2/25 - on PCV, not weaning 2/27 - improved post R & L thora (1L & 1.3L), more alert  STUDIES:  2/23 - CT ABD/Pelvis>>s/p colostomy without acute findings, small volume ascites, decompressed bowel, mild mesenteric & soft tissue edema, likely reflective of anasarca 2/23 - CT Chest>>>mod B effusions, consolidation, no ptx  INTERVAL HISTORY: Tachypnea & vent compliance improved post thora     PHYSICAL EXAMINATION: Vital signs: Reviewed, abnormal findings will be addressed in assessment / plan.    General: chronically ill on vent, appears uncomfortable Neuro: alert, awake, generalized weakness HEENT:  PERRL, #8 trach c/d/i, no bleeding Cardiovascular:  RRR Lungs:  Bilateral diminished air entry, coarse rhonchi bilaterally.   Abdomen:  Dressing intact, large, soft Musculoskeletal:  Moves all  extremities, no edema Skin:  Intact except for abd.  LABS: PULMONARY  Recent Labs Lab 09/07/13 1532 09/10/13 0500 09/10/13 1226 09/11/13 0520 09/12/13 0319  PHART 7.362 7.293* 7.221* 7.235* 7.277*  PCO2ART 39.2 46.8* 56.0* 53.1* 47.6*  PO2ART 81.0 98.5 87.1 84.0 95.6  HCO3 21.5 22.0 22.1 21.7 21.5  TCO2 22.7 23.4 23.8 23.3 23.0  O2SAT 96.5 99.1 98.1 95.7 97.5   CBC  Recent Labs Lab 09/07/13 0948 09/09/13 1122 09/10/13 1515  HGB 7.7* 6.5* 8.8*  HCT 23.8* 21.5* 27.5*  WBC 11.2* 10.9* 12.0*  PLT 208 197 206   CARDIAC  No results found for this basename: TROPONINI,  in the last 168 hours  Recent Labs Lab 09/10/13 0500  PROBNP 6160.0*   CHEMISTRY  Recent Labs Lab 09/07/13 0948 09/09/13 0630 09/10/13 0500 09/11/13 0530  NA 146 148* 152* 155*  K 4.9 5.9* 4.8 4.7  CL 112 114* 119* 118*  CO2 21 22 23 23   GLUCOSE 167* 124* 174* 130*  BUN 75* 79* 78* 71*  CREATININE 1.94* 2.08* 1.90* 1.72*  CALCIUM 7.8* 7.7* 7.7* 7.9*   The CrCl is unknown because both a height and weight (above a minimum accepted value) are required for this calculation.  LIVER No results found for this basename: AST, ALT, ALKPHOS, BILITOT, PROT, ALBUMIN, INR,  in the last 168 hours INFECTIOUS  Recent Labs Lab 09/10/13 1205 09/10/13 1500  LATICACIDVEN 1.2  --   PROCALCITON  --  0.24   IMAGING x48h  Dg Chest Port 1 View  09/12/2013   CLINICAL DATA:  Status post thoracentesis.  Question pneumothorax.  EXAM: PORTABLE CHEST - 1 VIEW  COMPARISON:  Single view of the chest 09/11/2013.  FINDINGS: The patient appears to be status post  right thoracentesis. No pneumothorax is identified. Bibasilar airspace disease persists and has worsened on the left. Right IJ catheter and tracheostomy tube are again noted.  IMPRESSION: No pneumothorax is identified after thoracentesis.  Bibasilar airspace disease has worsened on the left.   Electronically Signed   By: Inge Rise M.D.   On: 09/12/2013 20:21    Dg Chest Port 1 View  09/11/2013   CLINICAL DATA:  Pneumothorax appear  EXAM: PORTABLE CHEST - 1 VIEW  COMPARISON:  CT chest and plain film of the chest 09/09/2013.  FINDINGS: Tracheostomy tube and right IJ catheter remain in place. No pneumothorax is identified. Extensive bilateral airspace disease persists. Aeration in the left chest appears mildly improved. Heart size is upper normal.  IMPRESSION: No pneumothorax is identified.  Extensive bilateral airspace disease is worse on the right. Aeration in the left chest appears mildly improved.   Electronically Signed   By: Inge Rise M.D.   On: 09/11/2013 16:43    ASSESSMENT  Acute and chronic respiratory failure - multifactorial in setting of HCAP, abdominal surgeries, small pleural effusions, pain, atelectasis, anemia and deconditioning Pneumonia - positive for Stenotrophomonas Maltophilia  ARDS +/- cardiogenic pulmonary edema Atrial fibrillation Tracheostomy status   Hemoptysis - likely due to tracheal erosion vs suction trauma.  Resolved 2/24 Renal Failure  Anemia - in setting of critical illness, phlebotomy and trach site bleeding S/P Colectomy  PLAN: -Minimize suction trauma -Continuous mechanical support, PCV -Trend ABG/CXR -Hold weaning until oxygen requirements improve -Aim for neutral to negative fluid balance  -Antibiotics per primary team -cultures as above -PRN fentanyl for pain -mobilize as able, upright positioning, PT efforts  Noe Gens, NP-C [facility] Pgr: [phone] or [phone]  09/13/2013 12:10 PM  Patient had bilateral thoracentesis done over the past two days with 2400 cc out combined.  He is currently on PCV but becoming very asynchronous with it.  Changed to PS and was noted that he is requiring higher rate and lower volume.  Was changed to PCV to meet his requirement and appears much more comfortable.  Will also order an ABG and adjust vent accordingly once resulted.  CC time  35 min.  Patient seen and examined, agree with above note.  I dictated the care and orders written for this patient under my direction.  Rush Farmer, MD [phone]

## 2013-09-14 LAB — BASIC METABOLIC PANEL
BUN: 49 mg/dL — ABNORMAL HIGH (ref 6–23)
CHLORIDE: 114 meq/L — AB (ref 96–112)
CO2: 21 meq/L (ref 19–32)
Calcium: 7.9 mg/dL — ABNORMAL LOW (ref 8.4–10.5)
Creatinine, Ser: 1.48 mg/dL — ABNORMAL HIGH (ref 0.50–1.35)
GFR calc Af Amer: 54 mL/min — ABNORMAL LOW (ref >90)
GFR calc non Af Amer: 46 mL/min — ABNORMAL LOW (ref >90)
Glucose, Bld: 135 mg/dL — ABNORMAL HIGH (ref 70–99)
Potassium: 4.3 mEq/L (ref 3.7–5.3)
SODIUM: 146 meq/L (ref 137–147)

## 2013-09-14 LAB — BLOOD GAS, ARTERIAL
Acid-base deficit: 6.8 mmol/L — ABNORMAL HIGH (ref 0.0–2.0)
Bicarbonate: 20.1 mEq/L (ref 20.0–24.0)
FIO2: 0.4 %
O2 Saturation: 95.2 %
PCO2 ART: 54.9 mmHg — AB (ref 35.0–45.0)
PEEP/CPAP: 5 cmH2O
PO2 ART: 80.4 mmHg (ref 80.0–100.0)
Patient temperature: 98.6
Pressure support: 12 cmH2O
TCO2: 21.8 mmol/L (ref 0–100)
pH, Arterial: 7.188 — CL (ref 7.350–7.450)

## 2013-09-14 LAB — CBC
HCT: 25.6 % — ABNORMAL LOW (ref 39.0–52.0)
Hemoglobin: 8.1 g/dL — ABNORMAL LOW (ref 13.0–17.0)
MCH: 31 pg (ref 26.0–34.0)
MCHC: 31.6 g/dL (ref 30.0–36.0)
MCV: 98.1 fL (ref 78.0–100.0)
PLATELETS: 201 10*3/uL (ref 150–400)
RBC: 2.61 MIL/uL — ABNORMAL LOW (ref 4.22–5.81)
RDW: 16.7 % — AB (ref 11.5–15.5)
WBC: 12.2 10*3/uL — ABNORMAL HIGH (ref 4.0–10.5)

## 2013-09-14 LAB — BODY FLUID CULTURE
Culture: NO GROWTH
Gram Stain: NONE SEEN

## 2013-09-15 LAB — BASIC METABOLIC PANEL
BUN: 46 mg/dL — ABNORMAL HIGH (ref 6–23)
CO2: 20 mEq/L (ref 19–32)
Calcium: 7.7 mg/dL — ABNORMAL LOW (ref 8.4–10.5)
Chloride: 118 mEq/L — ABNORMAL HIGH (ref 96–112)
Creatinine, Ser: 1.41 mg/dL — ABNORMAL HIGH (ref 0.50–1.35)
GFR calc Af Amer: 57 mL/min — ABNORMAL LOW (ref >90)
GFR calc non Af Amer: 49 mL/min — ABNORMAL LOW (ref >90)
Glucose, Bld: 116 mg/dL — ABNORMAL HIGH (ref 70–99)
Potassium: 4.5 mEq/L (ref 3.7–5.3)
Sodium: 150 mEq/L — ABNORMAL HIGH (ref 137–147)

## 2013-09-15 LAB — BLOOD GAS, ARTERIAL
Acid-base deficit: 5.2 mmol/L — ABNORMAL HIGH (ref 0.0–2.0)
Bicarbonate: 20.1 mEq/L (ref 20.0–24.0)
FIO2: 40 %
MECHVT: 450 mL
O2 Saturation: 94.5 %
PEEP: 5 cmH2O
Patient temperature: 98.6
RATE: 16 resp/min
TCO2: 21.4 mmol/L (ref 0–100)
pCO2 arterial: 42.2 mmHg (ref 35.0–45.0)
pH, Arterial: 7.3 — ABNORMAL LOW (ref 7.350–7.450)
pO2, Arterial: 69.7 mmHg — ABNORMAL LOW (ref 80.0–100.0)

## 2013-09-15 LAB — CULTURE, BLOOD (ROUTINE X 2)
CULTURE: NO GROWTH
Culture: NO GROWTH

## 2013-09-15 LAB — CBC
HCT: 25 % — ABNORMAL LOW (ref 39.0–52.0)
Hemoglobin: 7.8 g/dL — ABNORMAL LOW (ref 13.0–17.0)
MCH: 30.6 pg (ref 26.0–34.0)
MCHC: 31.2 g/dL (ref 30.0–36.0)
MCV: 98 fL (ref 78.0–100.0)
Platelets: 206 10*3/uL (ref 150–400)
RBC: 2.55 MIL/uL — ABNORMAL LOW (ref 4.22–5.81)
RDW: 16.5 % — ABNORMAL HIGH (ref 11.5–15.5)
WBC: 11.8 10*3/uL — ABNORMAL HIGH (ref 4.0–10.5)

## 2013-09-16 ENCOUNTER — Other Ambulatory Visit (HOSPITAL_COMMUNITY): Payer: Self-pay

## 2013-09-16 DIAGNOSIS — J90 Pleural effusion, not elsewhere classified: Secondary | ICD-10-CM | POA: Diagnosis not present

## 2013-09-16 LAB — BLOOD GAS, ARTERIAL
Acid-base deficit: 6.3 mmol/L — ABNORMAL HIGH (ref 0.0–2.0)
Acid-base deficit: 6.3 mmol/L — ABNORMAL HIGH (ref 0.0–2.0)
BICARBONATE: 21 meq/L (ref 20.0–24.0)
BICARBONATE: 19.5 meq/L — AB (ref 20.0–24.0)
FIO2: 0.4 %
FIO2: 40 %
MECHVT: 450 mL
Mode: POSITIVE
O2 SAT: 98.5 %
O2 Saturation: 97.6 %
PCO2 ART: 44.9 mmHg (ref 35.0–45.0)
PEEP/CPAP: 5 cmH2O
PEEP/CPAP: 5 cmH2O
PH ART: 7.161 — AB (ref 7.350–7.450)
PRESSURE SUPPORT: 12 cmH2O
Patient temperature: 99
Patient temperature: 98.6
RATE: 16 resp/min
TCO2: 22.9 mmol/L (ref 0–100)
TCO2: 20.9 mmol/L (ref 0–100)
pCO2 arterial: 61.5 mmHg (ref 35.0–45.0)
pH, Arterial: 7.261 — ABNORMAL LOW (ref 7.350–7.450)
pO2, Arterial: 104 mmHg — ABNORMAL HIGH (ref 80.0–100.0)
pO2, Arterial: 97.3 mmHg (ref 80.0–100.0)

## 2013-09-16 LAB — RENAL FUNCTION PANEL
ALBUMIN: 1.4 g/dL — AB (ref 3.5–5.2)
BUN: 47 mg/dL — ABNORMAL HIGH (ref 6–23)
CO2: 20 mEq/L (ref 19–32)
Calcium: 7.6 mg/dL — ABNORMAL LOW (ref 8.4–10.5)
Chloride: 117 mEq/L — ABNORMAL HIGH (ref 96–112)
Creatinine, Ser: 1.43 mg/dL — ABNORMAL HIGH (ref 0.50–1.35)
GFR calc Af Amer: 56 mL/min — ABNORMAL LOW (ref >90)
GFR, EST NON AFRICAN AMERICAN: 48 mL/min — AB (ref >90)
Glucose, Bld: 115 mg/dL — ABNORMAL HIGH (ref 70–99)
PHOSPHORUS: 3.1 mg/dL (ref 2.3–4.6)
POTASSIUM: 4.3 meq/L (ref 3.7–5.3)
Sodium: 149 mEq/L — ABNORMAL HIGH (ref 137–147)

## 2013-09-16 LAB — CBC
HCT: 25.3 % — ABNORMAL LOW (ref 39.0–52.0)
HEMOGLOBIN: 8.1 g/dL — AB (ref 13.0–17.0)
MCH: 31.6 pg (ref 26.0–34.0)
MCHC: 32 g/dL (ref 30.0–36.0)
MCV: 98.8 fL (ref 78.0–100.0)
PLATELETS: 185 10*3/uL (ref 150–400)
RBC: 2.56 MIL/uL — AB (ref 4.22–5.81)
RDW: 16.7 % — ABNORMAL HIGH (ref 11.5–15.5)
WBC: 10.3 10*3/uL (ref 4.0–10.5)

## 2013-09-17 DIAGNOSIS — IMO0002 Reserved for concepts with insufficient information to code with codable children: Secondary | ICD-10-CM

## 2013-09-17 DIAGNOSIS — B9689 Other specified bacterial agents as the cause of diseases classified elsewhere: Secondary | ICD-10-CM

## 2013-09-17 DIAGNOSIS — Z1635 Resistance to multiple antimicrobial drugs: Secondary | ICD-10-CM

## 2013-09-17 LAB — BASIC METABOLIC PANEL
BUN: 46 mg/dL — ABNORMAL HIGH (ref 6–23)
CO2: 21 meq/L (ref 19–32)
Calcium: 7.8 mg/dL — ABNORMAL LOW (ref 8.4–10.5)
Chloride: 116 mEq/L — ABNORMAL HIGH (ref 96–112)
Creatinine, Ser: 1.49 mg/dL — ABNORMAL HIGH (ref 0.50–1.35)
GFR calc Af Amer: 53 mL/min — ABNORMAL LOW (ref >90)
GFR calc non Af Amer: 46 mL/min — ABNORMAL LOW (ref >90)
GLUCOSE: 111 mg/dL — AB (ref 70–99)
POTASSIUM: 4.6 meq/L (ref 3.7–5.3)
Sodium: 149 mEq/L — ABNORMAL HIGH (ref 137–147)

## 2013-09-17 LAB — BLOOD GAS, ARTERIAL
ACID-BASE DEFICIT: 5.8 mmol/L — AB (ref 0.0–2.0)
Bicarbonate: 21.8 mEq/L (ref 20.0–24.0)
FIO2: 0.4 %
O2 Saturation: 98.8 %
PATIENT TEMPERATURE: 98.6
PEEP: 5 cmH2O
PO2 ART: 87.9 mmHg (ref 80.0–100.0)
Pressure support: 12 cmH2O
TCO2: 23.7 mmol/L (ref 0–100)
pCO2 arterial: 64.4 mmHg (ref 35.0–45.0)
pH, Arterial: 7.155 — CL (ref 7.350–7.450)

## 2013-09-17 LAB — VANCOMYCIN, TROUGH: VANCOMYCIN TR: 20.2 ug/mL — AB (ref 10.0–20.0)

## 2013-09-17 LAB — LACTIC ACID, PLASMA: LACTIC ACID, VENOUS: 1.2 mmol/L (ref 0.5–2.2)

## 2013-09-17 NOTE — Progress Notes (Signed)
PULMONARY  / CRITICAL CARE MEDICINE  Name: Jason Harmon MRN: [id number] DOB: 1940/09/03  CONSULTATION DATE: 09/05/2013  BRIEF PATIENT DESCRIPTION: 73 y/o who initially presented to an outside facility on 1/9 with SBO / ischemic bowel requiring laparotomy and small bowel resection. His course was complicated by ARF, now on dialysis and respiratory failure resulting in tracheostomy and prolonged mechanical ventilation.  Has required multiple surgeries for ischemic bowel with perforation.    CULTURES: BCx2 2/23>>> PICC Tip 2/23>>>neg Tracheal Aspirate 2/23>>>neg UC 2/23>>>neg  ANTIBIOTICS: Zosyn 2/1 >> 2/12  Vancomycin 2/9 >> 2/10  Linezolid 2/10 >> 2/12  Bactrim 2/12 >> stop date 2/27  .....................................................Marland Kitchen Vanco 2/23>>> Meropenem 2/23>>> .....................................................Marland Kitchen ABX per [facility] MD  KEY EVENTS  2/19 - FOB with evidence of tracheal erosion & bleeding.   2/20 - no further hemoptysis  2/23 - Full vent dependent. They still have some blood secretions occ. Through trach. Not following commands.  2/24 - PCT 0.24, lactic acid 1.2 2/25 - on PCV, not weaning 2/27 - improved post R & L thora (1L & 1.3L), more alert  STUDIES:  2/23 - CT ABD/Pelvis>>s/p colostomy without acute findings, small volume ascites, decompressed bowel, mild mesenteric & soft tissue edema, likely reflective of anasarca 2/23 - CT Chest>>>mod B effusions, consolidation, no ptx  INTERVAL HISTORY:  No acute changes, remains on full support   PHYSICAL EXAMINATION: Vital signs: Reviewed, abnormal findings will be addressed in assessment / plan.    General: chronically ill on vent, appears uncomfortable Neuro: alert, awake, generalized weakness HEENT:  PERRL, #8 trach c/d/i, no bleeding Cardiovascular:  RRR Lungs:  Bilateral diminished air entry, coarse rhonchi bilaterally.   Abdomen:  Dressing intact, large, soft Musculoskeletal:  Moves all  extremities, no edema Skin:  Intact except for abd.  LABS: PULMONARY  Recent Labs Lab 09/13/13 1720 09/14/13 1238 09/15/13 0500 09/15/13 1655 09/16/13 0446  PHART 7.230* 7.188* 7.300* 7.161* 7.261*  PCO2ART 48.9* 54.9* 42.2 61.5* 44.9  PO2ART 229.0* 80.4 69.7* 104.0* 97.3  HCO3 19.7* 20.1 20.1 21.0 19.5*  TCO2 21.2 21.8 21.4 22.9 20.9  O2SAT 99.8 95.2 94.5 97.6 98.5   CBC  Recent Labs Lab 09/14/13 0500 09/15/13 0610 09/16/13 0500  HGB 8.1* 7.8* 8.1*  HCT 25.6* 25.0* 25.3*  WBC 12.2* 11.8* 10.3  PLT 201 206 185   CARDIAC  No results found for this basename: TROPONINI,  in the last 168 hours No results found for this basename: PROBNP,  in the last 168 hours CHEMISTRY  Recent Labs Lab 09/13/13 1800 09/14/13 0500 09/15/13 0610 09/16/13 0500 09/17/13 0500  NA 147 146 150* 149* 149*  K 4.4 4.3 4.5 4.3 4.6  CL 113* 114* 118* 117* 116*  CO2 22 21 20 20 21   GLUCOSE 113* 135* 116* 115* 111*  BUN 52* 49* 46* 47* 46*  CREATININE 1.60* 1.48* 1.41* 1.43* 1.49*  CALCIUM 7.7* 7.9* 7.7* 7.6* 7.8*  PHOS  --   --   --  3.1  --    The CrCl is unknown because both a height and weight (above a minimum accepted value) are required for this calculation.  LIVER  Recent Labs Lab 09/16/13 0500  ALBUMIN 1.4*   INFECTIOUS  Recent Labs Lab 09/10/13 1500 09/17/13 0500  LATICACIDVEN  --  1.2  PROCALCITON 0.24  --    IMAGING x48h  Dg Chest Port 1 View  09/16/2013   CLINICAL DATA:  Infiltrates.  EXAM: PORTABLE CHEST - 1 VIEW  COMPARISON:  09/12/2013  FINDINGS: Support devices are stable. Patchy bilateral infiltrates have worsened slightly since prior study. Mild cardiomegaly. Small bilateral pleural effusions have also increased.  IMPRESSION: Worsening bilateral airspace opacities and bilateral effusions.   Electronically Signed   By: Rolm Baptise M.D.   On: 09/16/2013 08:13    ASSESSMENT  Acute and chronic respiratory failure - multifactorial in setting of HCAP,  abdominal surgeries, small pleural effusions, pain, atelectasis, anemia and deconditioning Pneumonia - positive for Stenotrophomonas Maltophilia  ARDS +/- cardiogenic pulmonary edema Atrial fibrillation Tracheostomy status   Hemoptysis - likely due to tracheal erosion vs suction trauma.  Resolved 2/24 Renal Failure  Anemia - in setting of critical illness, phlebotomy and trach site bleeding S/P Colectomy  PLAN: -Minimize suction trauma with trach bleeding (resolving) -Continuous mechanical support, with wean protocol as tolerated -Trend ABG/CXR -Aim for neutral to negative fluid balance  -Antibiotics per primary team -cultures as above -PRN fentanyl for pain -mobilize as able, upright positioning, PT efforts -ongoing discussions with family regarding goals of care   Noe Gens, NP-C [facility] Pgr: [phone] or [phone]  09/17/2013 12:28 PM  Merton Border, MD ; [facility] service Mobile [phone].  After 5:30 PM or weekends, call [phone]

## 2013-09-18 LAB — BASIC METABOLIC PANEL
BUN: 46 mg/dL — ABNORMAL HIGH (ref 6–23)
CALCIUM: 7.9 mg/dL — AB (ref 8.4–10.5)
CHLORIDE: 114 meq/L — AB (ref 96–112)
CO2: 22 mEq/L (ref 19–32)
Creatinine, Ser: 1.49 mg/dL — ABNORMAL HIGH (ref 0.50–1.35)
GFR calc Af Amer: 53 mL/min — ABNORMAL LOW (ref >90)
GFR calc non Af Amer: 46 mL/min — ABNORMAL LOW (ref >90)
GLUCOSE: 94 mg/dL (ref 70–99)
POTASSIUM: 4.9 meq/L (ref 3.7–5.3)
SODIUM: 147 meq/L (ref 137–147)

## 2013-09-18 NOTE — Progress Notes (Signed)
[facility]                                                                                              Progress note     Patient Demographics  Jason Harmon, is a 73 y.o. male  [id number]  [id number]  DOB - 10/04/1940  Admit date - 09/04/2013  Admitting Physician Merton Border, MD  Outpatient Primary MD for the patient is Pcp Not In System  LOS - 14   No chief complaint on file.          Subjective:   Unable to obtain due to patient's condition and mechanical ventilation  Objective:   Vital signs  Temperature 9 8.6 Heart rate 97 Respiratory rate 22 Blood pressure 12 0/70 Pulse ox 97%    Exam Awake and alert, No new F.N deficits,  [place].AT Supple Neck,No JVD, No cervical lymphadenopathy appriciated.  Tracheostomy noted at midline Symmetrical Chest wall movement, decreased breath sounds bilaterally with scattered rhonchi RRR,No Gallops,Rubs or new Murmurs, No Parasternal Heave +ve B.Sounds, Abd Soft, Non tender, No organomegaly appriciated, No rebound - guarding or rigidity. PEG tube and colostomy noted No Cyanosis, Clubbing or edema,  Multiple wounds noted  I&Os 3413/2800 Foley - yes Trach #8 Shiley   Data Review   CBC  Recent Labs Lab 09/14/13 0500 09/15/13 0610 09/16/13 0500  WBC 12.2* 11.8* 10.3  HGB 8.1* 7.8* 8.1*  HCT 25.6* 25.0* 25.3*  PLT 201 206 185  MCV 98.1 98.0 98.8  MCH 31.0 30.6 31.6  MCHC 31.6 31.2 32.0  RDW 16.7* 16.5* 16.7*    Chemistries   Recent Labs Lab 09/14/13 0500 09/15/13 0610 09/16/13 0500 09/17/13 0500 09/18/13 0500  NA 146 150* 149* 149* 147  K 4.3 4.5 4.3 4.6 4.9  CL 114* 118* 117* 116* 114*  CO2 21 20 20 21 22   GLUCOSE 135* 116* 115* 111* 94  BUN 49* 46* 47* 46* 46*  CREATININE 1.48* 1.41* 1.43* 1.49* 1.49*  CALCIUM 7.9* 7.7* 7.6* 7.8* 7.9*    Micro Results Recent Results (from the past 240 hour(s))   CULTURE, EXPECTORATED SPUTUM-ASSESSMENT     Status: None   Collection Time    09/09/13  1:48 AM      Result Value Ref Range Status   Specimen Description SPUTUM   Final   Special Requests NONE   Final   Sputum evaluation     Final   Value: THIS SPECIMEN IS ACCEPTABLE. RESPIRATORY CULTURE REPORT TO FOLLOW.   Report Status 09/09/2013 FINAL   Final  CULTURE, RESPIRATORY (NON-EXPECTORATED)     Status: None   Collection Time    09/09/13  1:48 AM      Result Value Ref Range Status   Specimen Description SPUTUM   Final   Special Requests NONE   Final   Gram Stain     Final   Value: FEW WBC PRESENT, PREDOMINANTLY PMN     RARE SQUAMOUS EPITHELIAL CELLS PRESENT     NO ORGANISMS SEEN     Performed at [facility]     Final  Value: NORMAL OROPHARYNGEAL FLORA     Performed at [facility]   Report Status 09/11/2013 FINAL   Final  URINE CULTURE     Status: None   Collection Time    09/09/13  1:41 PM      Result Value Ref Range Status   Specimen Description URINE, RANDOM   Final   Special Requests NONE   Final   Culture  Setup Time     Final   Value: 09/09/2013 20:02     Performed at [place]     Final   Value: NO GROWTH     Performed at [facility]   Culture     Final   Value: NO GROWTH     Performed at [facility]   Report Status 09/10/2013 FINAL   Final  CULTURE, BLOOD (ROUTINE X 2)     Status: None   Collection Time    09/09/13  2:55 PM      Result Value Ref Range Status   Specimen Description RIGHT ANTECUBITAL   Final   Special Requests BOTTLES DRAWN AEROBIC ONLY 4CC   Final   Culture  Setup Time     Final   Value: 09/09/2013 21:20     Performed at [facility]   Culture     Final   Value: NO GROWTH 5 DAYS     Performed at [facility]   Report Status 09/15/2013 FINAL   Final  CULTURE, BLOOD (ROUTINE X 2)     Status: None   Collection Time    09/09/13  3:05 PM      Result Value Ref  Range Status   Specimen Description HAND RIGHT   Final   Special Requests BOTTLES DRAWN AEROBIC ONLY 5CC   Final   Culture  Setup Time     Final   Value: 09/09/2013 21:19     Performed at [facility]   Culture     Final   Value: NO GROWTH 5 DAYS     Performed at [facility]   Report Status 09/15/2013 FINAL   Final  CATH TIP CULTURE     Status: None   Collection Time    09/09/13  6:31 PM      Result Value Ref Range Status   Specimen Description CATH TIP PICC LINE   Final   Special Requests NONE   Final   Culture     Final   Value: NO GROWTH 2 DAYS     Performed at [facility]   Report Status 09/12/2013 FINAL   Final  BODY FLUID CULTURE     Status: None   Collection Time    09/11/13  2:08 PM      Result Value Ref Range Status   Specimen Description FLUID LEFT PLEURAL   Final   Special Requests NONE   Final   Gram Stain     Final   Value: NO WBC SEEN     NO ORGANISMS SEEN     Performed at [facility]   Culture     Final   Value: NO GROWTH 3 DAYS     Performed at [facility]   Report Status 09/14/2013 FINAL   Final       Assessment & Plan   VDRF; continue with PSV trials goal stage 5 ARDS Bilateral pneumonia on vancomycin and meropenem Bowel perforation status post colostomy  Moderate bilateral pleural effusion status post thoracentesis 2/25 Renal failure; creatinine is 2 (2 Atrial fib heart rate improving Hemoptysis status post bronchoscopy on 219 that showed particular erosions 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

## 2013-09-19 ENCOUNTER — Other Ambulatory Visit (HOSPITAL_COMMUNITY): Payer: Self-pay

## 2013-09-19 DIAGNOSIS — J90 Pleural effusion, not elsewhere classified: Secondary | ICD-10-CM | POA: Diagnosis not present

## 2013-09-19 LAB — BASIC METABOLIC PANEL
BUN: 44 mg/dL — ABNORMAL HIGH (ref 6–23)
CHLORIDE: 111 meq/L (ref 96–112)
CO2: 22 mEq/L (ref 19–32)
CREATININE: 1.27 mg/dL (ref 0.50–1.35)
Calcium: 7.6 mg/dL — ABNORMAL LOW (ref 8.4–10.5)
GFR calc Af Amer: 64 mL/min — ABNORMAL LOW (ref >90)
GFR calc non Af Amer: 56 mL/min — ABNORMAL LOW (ref >90)
GLUCOSE: 118 mg/dL — AB (ref 70–99)
POTASSIUM: 4.3 meq/L (ref 3.7–5.3)
Sodium: 145 mEq/L (ref 137–147)

## 2013-09-19 NOTE — Progress Notes (Signed)
[facility]                                                                                              Progress note     Patient Demographics  Jason Harmon, is a 73 y.o. male  [id number]  [id number]  DOB - 11-Nov-1940  Admit date - 09/04/2013  Admitting Physician Merton Border, MD  Outpatient Primary MD for the patient is Pcp Not In System  LOS - 15   No chief complaint on file.          Subjective:   Unable to obtain due to patient's condition and mechanical ventilation  Objective:   Vital signs  Temperature 9 8.6 Heart rate 97 Respiratory rate 22 Blood pressure 12 0/70 Pulse ox 97%    Exam Awake and alert, No new F.N deficits,  [place].AT Supple Neck,No JVD, No cervical lymphadenopathy appriciated.  Tracheostomy noted at midline Symmetrical Chest wall movement, decreased breath sounds bilaterally with scattered rhonchi RRR,No Gallops,Rubs or new Murmurs, No Parasternal Heave +ve B.Sounds, Abd Soft, Non tender, No organomegaly appriciated, No rebound - guarding or rigidity. PEG tube and colostomy noted No Cyanosis, Clubbing or edema,  Multiple wounds noted  I&Os 3413/2800 Foley - yes Trach #8 Shiley   Data Review   CBC  Recent Labs Lab 09/14/13 0500 09/15/13 0610 09/16/13 0500  WBC 12.2* 11.8* 10.3  HGB 8.1* 7.8* 8.1*  HCT 25.6* 25.0* 25.3*  PLT 201 206 185  MCV 98.1 98.0 98.8  MCH 31.0 30.6 31.6  MCHC 31.6 31.2 32.0  RDW 16.7* 16.5* 16.7*    Chemistries   Recent Labs Lab 09/15/13 0610 09/16/13 0500 09/17/13 0500 09/18/13 0500 09/19/13 0510  NA 150* 149* 149* 147 145  K 4.5 4.3 4.6 4.9 4.3  CL 118* 117* 116* 114* 111  CO2 20 20 21 22 22   GLUCOSE 116* 115* 111* 94 118*  BUN 46* 47* 46* 46* 44*  CREATININE 1.41* 1.43* 1.49* 1.49* 1.27  CALCIUM 7.7* 7.6* 7.8* 7.9* 7.6*    Micro Results Recent Results (from the past 240 hour(s))  CATH  TIP CULTURE     Status: None   Collection Time    09/09/13  6:31 PM      Result Value Ref Range Status   Specimen Description CATH TIP PICC LINE   Final   Special Requests NONE   Final   Culture     Final   Value: NO GROWTH 2 DAYS     Performed at [facility]   Report Status 09/12/2013 FINAL   Final  BODY FLUID CULTURE     Status: None   Collection Time    09/11/13  2:08 PM      Result Value Ref Range Status   Specimen Description FLUID LEFT PLEURAL   Final   Special Requests NONE   Final   Gram Stain     Final   Value: NO WBC SEEN     NO ORGANISMS SEEN     Performed at [facility]   Culture     Final   Value:  NO GROWTH 3 DAYS     Performed at [facility]   Report Status 09/14/2013 FINAL   Final       Assessment & Plan   VDRF; continue with PSV trials goal stage 4   Chest x-ray shows stable pulmonary infiltrates and effusion ARDS Bilateral pneumonia on  meropenem Bowel perforation status post colostomy Moderate bilateral pleural effusion status post thoracentesis 2/25 Renal failure; creatinine is 2 (2 Atrial fib heart rate improving Hemoptysis status post bronchoscopy on 219 that showed particular erosions 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 DC vanc and Bactrim Discussed with pulmonary critical care and ID   Code Status: Limited  DVT Prophylaxis  SCDs   Merton Border M.D on 09/19/2013 at 3:55 PM

## 2013-09-20 LAB — BASIC METABOLIC PANEL
BUN: 37 mg/dL — AB (ref 6–23)
CALCIUM: 7.7 mg/dL — AB (ref 8.4–10.5)
CHLORIDE: 108 meq/L (ref 96–112)
CO2: 24 meq/L (ref 19–32)
CREATININE: 1.08 mg/dL (ref 0.50–1.35)
GFR calc Af Amer: 78 mL/min — ABNORMAL LOW (ref >90)
GFR calc non Af Amer: 68 mL/min — ABNORMAL LOW (ref >90)
Glucose, Bld: 127 mg/dL — ABNORMAL HIGH (ref 70–99)
Potassium: 4.1 mEq/L (ref 3.7–5.3)
Sodium: 142 mEq/L (ref 137–147)

## 2013-09-20 NOTE — Progress Notes (Addendum)
[facility]                                                                                              Progress note     Patient Demographics  Jason Harmon, is a 73 y.o. male  [id number]  [id number]  DOB - 05/18/1941  Admit date - 09/04/2013  Admitting Physician Merton Border, MD  Outpatient Primary MD for the patient is Pcp Not In System  LOS - 55   Chief complaint     Respiratory failure     Peritonitis         Subjective:   Unable to obtain due to patient's condition and mechanical ventilation  Objective:   Vital signs  Temperature 99.1 Heart rate 108 Respiratory rate 30 Blood pressure 135/78 Pulse ox 99%    Exam Awake and alert, No new F.N deficits,  [place].AT Supple Neck,No JVD, No cervical lymphadenopathy appriciated.  Tracheostomy noted at midline Symmetrical Chest wall movement, decreased breath sounds bilaterally with scattered rhonchi RRR,No Gallops,Rubs or new Murmurs, No Parasternal Heave +ve B.Sounds, Abd Soft, Non tender, No organomegaly appriciated, No rebound - guarding or rigidity. PEG tube and colostomy noted No Cyanosis, Clubbing or edema,  Multiple wounds noted  I&Os 1845/1550 Foley - yes Trach #8 Shiley   Data Review   CBC  Recent Labs Lab 09/14/13 0500 09/15/13 0610 09/16/13 0500  WBC 12.2* 11.8* 10.3  HGB 8.1* 7.8* 8.1*  HCT 25.6* 25.0* 25.3*  PLT 201 206 185  MCV 98.1 98.0 98.8  MCH 31.0 30.6 31.6  MCHC 31.6 31.2 32.0  RDW 16.7* 16.5* 16.7*    Chemistries   Recent Labs Lab 09/16/13 0500 09/17/13 0500 09/18/13 0500 09/19/13 0510 09/20/13 0630  NA 149* 149* 147 145 142  K 4.3 4.6 4.9 4.3 4.1  CL 117* 116* 114* 111 108  CO2 20 21 22 22 24   GLUCOSE 115* 111* 94 118* 127*  BUN 47* 46* 46* 44* 37*  CREATININE 1.43* 1.49* 1.49* 1.27 1.08  CALCIUM 7.6* 7.8* 7.9* 7.6* 7.7*    Micro Results Recent Results (from the  past 240 hour(s))  BODY FLUID CULTURE     Status: None   Collection Time    09/11/13  2:08 PM      Result Value Ref Range Status   Specimen Description FLUID LEFT PLEURAL   Final   Special Requests NONE   Final   Gram Stain     Final   Value: NO WBC SEEN     NO ORGANISMS SEEN     Performed at [facility]   Culture     Final   Value: NO GROWTH 3 DAYS     Performed at [facility]   Report Status 09/14/2013 FINAL   Final       Assessment & Plan   VDRF; continue with PSV trials goal stage 5 ARDS Bilateral pneumonia on  meropenem Bowel perforation status post colostomy Moderate bilateral pleural effusion status post thoracentesis 2/25 Renal failure; creatinine is 2 (2 Atrial fib heart rate improving Hemoptysis status post bronchoscopy on 219 that  showed particular erosions 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

## 2013-09-21 LAB — BASIC METABOLIC PANEL
BUN: 34 mg/dL — ABNORMAL HIGH (ref 6–23)
CHLORIDE: 106 meq/L (ref 96–112)
CO2: 25 meq/L (ref 19–32)
Calcium: 7.9 mg/dL — ABNORMAL LOW (ref 8.4–10.5)
Creatinine, Ser: 1.04 mg/dL (ref 0.50–1.35)
GFR calc non Af Amer: 71 mL/min — ABNORMAL LOW (ref >90)
GFR, EST AFRICAN AMERICAN: 82 mL/min — AB (ref >90)
Glucose, Bld: 126 mg/dL — ABNORMAL HIGH (ref 70–99)
POTASSIUM: 4.2 meq/L (ref 3.7–5.3)
SODIUM: 141 meq/L (ref 137–147)

## 2013-09-21 NOTE — Progress Notes (Addendum)
[facility]                                                                                              Progress note     Patient Demographics  Jason Harmon, is a 73 y.o. male  [id number]  [id number]  DOB - [birth year]  Admit date - 09/04/2013  Admitting Physician Merton Border, MD  Outpatient Primary MD for the patient is Pcp Not In System  LOS - 5   Chief complaint     Respiratory failure     Peritonitis         Subjective:   Unable to obtain due to patient's condition and mechanical ventilation  Objective:   Vital signs  Temperature 99. 3 Heart rate 112 Respiratory rate 28 Blood pressure 129/78 Pulse ox 99%    Exam Awake and alert, No new F.N deficits,  Jason Harmon.AT Supple Neck,No JVD, No cervical lymphadenopathy appriciated.  Tracheostomy noted at midline Symmetrical Chest wall movement, decreased breath sounds bilaterally with scattered rhonchi RRR,No Gallops,Rubs or new Murmurs, No Parasternal Heave +ve B.Sounds, Abd Soft, Non tender, No organomegaly appriciated, No rebound - guarding or rigidity. PEG tube and colostomy noted No Cyanosis, Clubbing or edema,  Multiple wounds noted  I&Os 3140/3350 Foley - yes Trach #8 Shiley   Data Review   CBC  Recent Labs Lab 09/15/13 0610 09/16/13 0500  WBC 11.8* 10.3  HGB 7.8* 8.1*  HCT 25.0* 25.3*  PLT 206 185  MCV 98.0 98.8  MCH 30.6 31.6  MCHC 31.2 32.0  RDW 16.5* 16.7*    Chemistries   Recent Labs Lab 09/17/13 0500 09/18/13 0500 09/19/13 0510 09/20/13 0630 09/21/13 0500  NA 149* 147 145 142 141  K 4.6 4.9 4.3 4.1 4.2  CL 116* 114* 111 108 106  CO2 21 22 22 24 25   GLUCOSE 111* 94 118* 127* 126*  BUN 46* 46* 44* 37* 34*  CREATININE 1.49* 1.49* 1.27 1.08 1.04  CALCIUM 7.8* 7.9* 7.6* 7.7* 7.9*    Micro Results No results found for this or any previous visit (from the past 240  hour(s)).     Assessment & Plan   VDRF; continue with PSV trials goal stage 6 today; failed weaning today. 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

## 2013-09-22 LAB — BASIC METABOLIC PANEL
BUN: 31 mg/dL — ABNORMAL HIGH (ref 6–23)
CHLORIDE: 105 meq/L (ref 96–112)
CO2: 25 meq/L (ref 19–32)
Calcium: 8 mg/dL — ABNORMAL LOW (ref 8.4–10.5)
Creatinine, Ser: 1.01 mg/dL (ref 0.50–1.35)
GFR calc Af Amer: 85 mL/min — ABNORMAL LOW (ref >90)
GFR calc non Af Amer: 73 mL/min — ABNORMAL LOW (ref >90)
GLUCOSE: 99 mg/dL (ref 70–99)
POTASSIUM: 3.7 meq/L (ref 3.7–5.3)
SODIUM: 141 meq/L (ref 137–147)

## 2013-09-22 NOTE — Progress Notes (Signed)
[facility]                                                                                              Progress note     Patient Demographics  Jason Harmon, is a 73 y.o. male  [id number]  [id number]  DOB - 02/18/1941  Admit date - 09/04/2013  Admitting Physician Merton Border, MD  Outpatient Primary MD for the patient is Pcp Not In System  LOS - 79   Chief complaint     Respiratory failure     Peritonitis       Subjective:   Unable to obtain due to patient's condition and mechanical ventilation  Objective:   Vital signs  Temperature 99.1 Heart rate 105 Respiratory rate 31 Blood pressure 146/80 Pulse ox 98%    Exam Awake and alert, No new F.N deficits,  [place].AT Supple Neck,No JVD, No cervical lymphadenopathy appriciated.  Tracheostomy noted at midline Symmetrical Chest wall movement, decreased breath sounds bilaterally with scattered rhonchi RRR,No Gallops,Rubs or new Murmurs, No Parasternal Heave +ve B.Sounds, Abd Soft, Non tender, No organomegaly appriciated, No rebound - guarding or rigidity. PEG tube and colostomy noted No Cyanosis, Clubbing or edema,  Multiple wounds noted  I&Os 2540/3125 Foley - yes Trach #8 Shiley   Data Review   CBC  Recent Labs Lab 09/16/13 0500  WBC 10.3  HGB 8.1*  HCT 25.3*  PLT 185  MCV 98.8  MCH 31.6  MCHC 32.0  RDW 16.7*    Chemistries   Recent Labs Lab 09/18/13 0500 09/19/13 0510 09/20/13 0630 09/21/13 0500 09/22/13 0500  NA 147 145 142 141 141  K 4.9 4.3 4.1 4.2 3.7  CL 114* 111 108 106 105  CO2 22 22 24 25 25   GLUCOSE 94 118* 127* 126* 99  BUN 46* 44* 37* 34* 31*  CREATININE 1.49* 1.27 1.08 1.04 1.01  CALCIUM 7.9* 7.6* 7.7* 7.9* 8.0*    Micro Results     Assessment & Plan   VDRF; continue with PSV trials . 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

## 2013-09-23 ENCOUNTER — Other Ambulatory Visit (HOSPITAL_COMMUNITY): Payer: Self-pay

## 2013-09-23 DIAGNOSIS — J90 Pleural effusion, not elsewhere classified: Secondary | ICD-10-CM | POA: Diagnosis not present

## 2013-09-23 DIAGNOSIS — J9600 Acute respiratory failure, unspecified whether with hypoxia or hypercapnia: Secondary | ICD-10-CM | POA: Diagnosis not present

## 2013-09-23 LAB — BASIC METABOLIC PANEL
BUN: 29 mg/dL — AB (ref 6–23)
CHLORIDE: 114 meq/L — AB (ref 96–112)
CO2: 26 meq/L (ref 19–32)
Calcium: 8.1 mg/dL — ABNORMAL LOW (ref 8.4–10.5)
Creatinine, Ser: 1 mg/dL (ref 0.50–1.35)
GFR calc Af Amer: 86 mL/min — ABNORMAL LOW (ref >90)
GFR calc non Af Amer: 74 mL/min — ABNORMAL LOW (ref >90)
GLUCOSE: 119 mg/dL — AB (ref 70–99)
POTASSIUM: 3.8 meq/L (ref 3.7–5.3)
SODIUM: 148 meq/L — AB (ref 137–147)

## 2013-09-23 LAB — CBC
HEMATOCRIT: 20.5 % — AB (ref 39.0–52.0)
HEMOGLOBIN: 6.5 g/dL — AB (ref 13.0–17.0)
MCH: 31.6 pg (ref 26.0–34.0)
MCHC: 31.7 g/dL (ref 30.0–36.0)
MCV: 99.5 fL (ref 78.0–100.0)
Platelets: 202 10*3/uL (ref 150–400)
RBC: 2.06 MIL/uL — ABNORMAL LOW (ref 4.22–5.81)
RDW: 15.9 % — ABNORMAL HIGH (ref 11.5–15.5)
WBC: 15.3 10*3/uL — ABNORMAL HIGH (ref 4.0–10.5)

## 2013-09-23 LAB — HEMOGLOBIN AND HEMATOCRIT, BLOOD
HEMATOCRIT: 19.4 % — AB (ref 39.0–52.0)
Hemoglobin: 6.2 g/dL — CL (ref 13.0–17.0)

## 2013-09-23 LAB — PREPARE RBC (CROSSMATCH)

## 2013-09-23 NOTE — Progress Notes (Signed)
PULMONARY  / CRITICAL CARE MEDICINE  Name: Jason Harmon MRN: [id number] DOB: 10-05-1940  CONSULTATION DATE: 09/05/2013  BRIEF PATIENT DESCRIPTION: 73 y/o who initially presented to an outside facility on 1/9 with SBO / ischemic bowel requiring laparotomy and small bowel resection. His course was complicated by ARF, now on dialysis and respiratory failure resulting in tracheostomy and prolonged mechanical ventilation.  Has required multiple surgeries for ischemic bowel with perforation.    CULTURES: BCx2 2/23>>> PICC Tip 2/23>>>neg Tracheal Aspirate 2/23>>>neg UC 2/23>>>neg  ANTIBIOTICS: Zosyn 2/1 >> 2/12  Vancomycin 2/9 >> 2/10  Linezolid 2/10 >> 2/12  Bactrim 2/12 >> stop date 2/27  .....................................................Marland Kitchen Vanco 2/23>>> Meropenem 2/23>>> .....................................................Marland Kitchen ABX per [facility] MD  KEY EVENTS  2/19 - FOB with evidence of tracheal erosion & bleeding.   2/20 - no further hemoptysis  2/23 - Full vent dependent. They still have some blood secretions occ. Through trach. Not following commands.  2/24 - PCT 0.24, lactic acid 1.2 2/25 - on PCV, not weaning 2/27 - improved post R & L thora (1L & 1.3L), more alert  STUDIES:  2/23 - CT ABD/Pelvis>>s/p colostomy without acute findings, small volume ascites, decompressed bowel, mild mesenteric & soft tissue edema, likely reflective of anasarca 2/23 - CT Chest>>>mod B effusions, consolidation, no ptx   INTERVAL HISTORY:  Anemic.  Tolerating ATC wean.  Family at bedside.    PHYSICAL EXAMINATION: Vital signs: Reviewed, abnormal findings will be addressed in assessment / plan.    General: chronically ill appearing  Neuro: alert, awake, generalized weakness HEENT:  PERRL, #8 trach c/d/i, no bleeding Cardiovascular:  RRR Lungs:  resps even non labored on ATC,  Bilateral diminished air entry, coarse rhonchi bilaterally.   Abdomen:  Dressing intact, large, soft Musculoskeletal:   Moves all extremities, no edema Skin:  Intact except for abd.  LABS: PULMONARY  Recent Labs Lab 09/17/13 2247  PHART 7.155*  PCO2ART 64.4*  PO2ART 87.9  HCO3 21.8  TCO2 23.7  O2SAT 98.8   CBC  Recent Labs Lab 09/23/13 0500  HGB 6.5*  HCT 20.5*  WBC 15.3*  PLT 202   CARDIAC  No results found for this basename: TROPONINI,  in the last 168 hours No results found for this basename: PROBNP,  in the last 168 hours CHEMISTRY  Recent Labs Lab 09/19/13 0510 09/20/13 0630 09/21/13 0500 09/22/13 0500 09/23/13 0500  NA 145 142 141 141 148*  K 4.3 4.1 4.2 3.7 3.8  CL 111 108 106 105 114*  CO2 22 24 25 25 26   GLUCOSE 118* 127* 126* 99 119*  BUN 44* 37* 34* 31* 29*  CREATININE 1.27 1.08 1.04 1.01 1.00  CALCIUM 7.6* 7.7* 7.9* 8.0* 8.1*   The CrCl is unknown because both a height and weight (above a minimum accepted value) are required for this calculation.  LIVER No results found for this basename: AST, ALT, ALKPHOS, BILITOT, PROT, ALBUMIN, INR,  in the last 168 hours INFECTIOUS  Recent Labs Lab 09/17/13 0500  LATICACIDVEN 1.2   IMAGING x48h  Dg Chest Port 1 View  09/23/2013   CLINICAL DATA:  Respiratory failure  EXAM: PORTABLE CHEST - 1 VIEW  COMPARISON:  Portable exam 0647 hr compared to 09/19/2013  FINDINGS: Tip of right jugular line projects over SVC near cavoatrial junction.  Tracheostomy tube tip projects over tracheal air column.  Mild enlargement of cardiac silhouette.  Diffuse bilateral pulmonary infiltrates and small bibasilar effusions, little changed.  No pneumothorax or acute osseous findings.  IMPRESSION: No significant change in bilateral pulmonary infiltrates and pleural effusions.   Electronically Signed   By: Lavonia Dana M.D.   On: 09/23/2013 08:15    ASSESSMENT  Acute on chronic respiratory failure - multifactorial in setting of HCAP, abdominal surgeries, small pleural effusions, pain, atelectasis, anemia and deconditioning Pneumonia - positive  for Stenotrophomonas Maltophilia  ARDS +/- cardiogenic pulmonary edema Atrial fibrillation Tracheostomy status   Hemoptysis - likely due to tracheal erosion vs suction trauma.  Resolved 2/24 Renal Failure  Anemia - in setting of critical illness, phlebotomy and trach site bleeding S/P Colectomy  PLAN:  -Cont ATC wean per protocol - planned 3-4 hrs ATC 3/9 -Trend ABG/CXR -Aim for neutral to negative fluid balance  -Antibiotics per primary team -cultures as above -PRN fentanyl for pain -transfuse PRBC for Hgb <7 -mobilize as able, upright positioning, PT efforts -ongoing discussions with family regarding goals of care   [place], NP 09/23/2013  10:09 AM Pager: [phone] or [phone]  *Care during the described time interval was provided by me and/or other providers on the critical care team. I have reviewed this patient's available data, including medical history, events of note, physical examination and test results as part of my evaluation.   Baltazar Apo, MD, PhD 09/23/2013, 11:46 AM  Pulmonary and Critical Care [phone] or if no answer [phone]

## 2013-09-24 ENCOUNTER — Other Ambulatory Visit (HOSPITAL_COMMUNITY): Payer: Self-pay

## 2013-09-24 LAB — BASIC METABOLIC PANEL
BUN: 29 mg/dL — ABNORMAL HIGH (ref 6–23)
BUN: 28 mg/dL — ABNORMAL HIGH (ref 6–23)
CO2: 27 meq/L (ref 19–32)
CO2: 27 mEq/L (ref 19–32)
CREATININE: 0.94 mg/dL (ref 0.50–1.35)
Calcium: 8 mg/dL — ABNORMAL LOW (ref 8.4–10.5)
Calcium: 8.1 mg/dL — ABNORMAL LOW (ref 8.4–10.5)
Chloride: 113 mEq/L — ABNORMAL HIGH (ref 96–112)
Chloride: 111 mEq/L (ref 96–112)
Creatinine, Ser: 0.97 mg/dL (ref 0.50–1.35)
GFR calc non Af Amer: 82 mL/min — ABNORMAL LOW (ref >90)
GFR calc non Af Amer: 83 mL/min — ABNORMAL LOW (ref >90)
Glucose, Bld: 107 mg/dL — ABNORMAL HIGH (ref 70–99)
Glucose, Bld: 123 mg/dL — ABNORMAL HIGH (ref 70–99)
POTASSIUM: 4 meq/L (ref 3.7–5.3)
Potassium: 4.2 mEq/L (ref 3.7–5.3)
SODIUM: 148 meq/L — AB (ref 137–147)
Sodium: 150 mEq/L — ABNORMAL HIGH (ref 137–147)

## 2013-09-24 LAB — CBC
HCT: 22.4 % — ABNORMAL LOW (ref 39.0–52.0)
HEMOGLOBIN: 7.2 g/dL — AB (ref 13.0–17.0)
MCH: 31.9 pg (ref 26.0–34.0)
MCHC: 32.1 g/dL (ref 30.0–36.0)
MCV: 99.1 fL (ref 78.0–100.0)
Platelets: 203 10*3/uL (ref 150–400)
RBC: 2.26 MIL/uL — AB (ref 4.22–5.81)
RDW: 15.8 % — ABNORMAL HIGH (ref 11.5–15.5)
WBC: 12.1 10*3/uL — ABNORMAL HIGH (ref 4.0–10.5)

## 2013-09-24 LAB — PREPARE RBC (CROSSMATCH)

## 2013-09-24 LAB — PROTIME-INR
INR: 1.84 — AB (ref 0.00–1.49)
Prothrombin Time: 20.7 seconds — ABNORMAL HIGH (ref 11.6–15.2)

## 2013-09-24 LAB — BLOOD GAS, ARTERIAL
Acid-Base Excess: 0.2 mmol/L (ref 0.0–2.0)
Bicarbonate: 26 mEq/L — ABNORMAL HIGH (ref 20.0–24.0)
FIO2: 50 %
LHR: 16 {breaths}/min
MECHVT: 450 mL
O2 SAT: 96.4 %
PEEP/CPAP: 5 cmH2O
PO2 ART: 78.3 mmHg — AB (ref 80.0–100.0)
Patient temperature: 98.6
TCO2: 27.7 mmol/L (ref 0–100)
pCO2 arterial: 55.8 mmHg — ABNORMAL HIGH (ref 35.0–45.0)
pH, Arterial: 7.291 — ABNORMAL LOW (ref 7.350–7.450)

## 2013-09-24 LAB — APTT: APTT: 41 s — AB (ref 24–37)

## 2013-09-25 ENCOUNTER — Other Ambulatory Visit (HOSPITAL_COMMUNITY): Payer: Self-pay

## 2013-09-25 LAB — PROTIME-INR
INR: 1.12 (ref 0.00–1.49)
Prothrombin Time: 14.2 seconds (ref 11.6–15.2)

## 2013-09-25 LAB — BLOOD GAS, ARTERIAL
Acid-Base Excess: 2.2 mmol/L — ABNORMAL HIGH (ref 0.0–2.0)
Bicarbonate: 27.7 mEq/L — ABNORMAL HIGH (ref 20.0–24.0)
FIO2: 0.5 %
MECHVT: 450 mL
O2 Saturation: 99.7 %
PCO2 ART: 56 mmHg — AB (ref 35.0–45.0)
PEEP: 5 cmH2O
PH ART: 7.315 — AB (ref 7.350–7.450)
PO2 ART: 166 mmHg — AB (ref 80.0–100.0)
Patient temperature: 98.6
RATE: 16 resp/min
TCO2: 29.5 mmol/L (ref 0–100)

## 2013-09-25 LAB — BASIC METABOLIC PANEL
BUN: 31 mg/dL — AB (ref 6–23)
CO2: 29 mEq/L (ref 19–32)
Calcium: 8.1 mg/dL — ABNORMAL LOW (ref 8.4–10.5)
Chloride: 111 mEq/L (ref 96–112)
Creatinine, Ser: 0.94 mg/dL (ref 0.50–1.35)
GFR calc Af Amer: >90 mL/min (ref >90)
GFR calc non Af Amer: 83 mL/min — ABNORMAL LOW (ref >90)
Glucose, Bld: 120 mg/dL — ABNORMAL HIGH (ref 70–99)
POTASSIUM: 4 meq/L (ref 3.7–5.3)
Sodium: 149 mEq/L — ABNORMAL HIGH (ref 137–147)

## 2013-09-25 LAB — CBC
HCT: 21.7 % — ABNORMAL LOW (ref 39.0–52.0)
HEMOGLOBIN: 6.9 g/dL — AB (ref 13.0–17.0)
MCH: 31.8 pg (ref 26.0–34.0)
MCHC: 31.8 g/dL (ref 30.0–36.0)
MCV: 100 fL (ref 78.0–100.0)
Platelets: 200 10*3/uL (ref 150–400)
RBC: 2.17 MIL/uL — ABNORMAL LOW (ref 4.22–5.81)
RDW: 16 % — AB (ref 11.5–15.5)
WBC: 11.1 10*3/uL — ABNORMAL HIGH (ref 4.0–10.5)

## 2013-09-25 LAB — PRO B NATRIURETIC PEPTIDE: PRO B NATRI PEPTIDE: 5971 pg/mL — AB (ref 0–125)

## 2013-09-25 LAB — PREPARE FRESH FROZEN PLASMA: Unit division: 0

## 2013-09-25 LAB — PREPARE RBC (CROSSMATCH)

## 2013-09-25 LAB — TROPONIN I

## 2013-09-25 NOTE — Progress Notes (Signed)
PULMONARY  / CRITICAL CARE MEDICINE  Name: Jason Harmon MRN: [id number] DOB: [birth year]  CONSULTATION DATE: 09/05/2013  BRIEF PATIENT DESCRIPTION: 73 y/o who initially presented to an outside facility on 1/9 with SBO / ischemic bowel requiring laparotomy and small bowel resection. His course was complicated by ARF, now on dialysis and respiratory failure resulting in tracheostomy and prolonged mechanical ventilation.  Has required multiple surgeries for ischemic bowel with perforation.    CULTURES: BCx2 2/23>>> PICC Tip 2/23>>>neg Tracheal Aspirate 2/23>>>neg UC 2/23>>>neg  ANTIBIOTICS: Zosyn 2/1 >> 2/12  Vancomycin 2/9 >> 2/10  Linezolid 2/10 >> 2/12  Bactrim 2/12 >> stop date 2/27  .....................................................Marland Kitchen Vanco 2/23>>> Meropenem 2/23>>> .....................................................Marland Kitchen ABX per [facility] MD  KEY EVENTS  2/19 - FOB with evidence of tracheal erosion & bleeding.   2/20 - no further hemoptysis  2/23 - Full vent dependent. They still have some blood secretions occ. Through trach. Not following commands.  2/24 - PCT 0.24, lactic acid 1.2 2/25 - on PCV, not weaning 2/27 - improved post R & L thora (1L & 1.3L), more alert  STUDIES:  2/23 - CT ABD/Pelvis>>s/p colostomy without acute findings, small volume ascites, decompressed bowel, mild mesenteric & soft tissue edema, likely reflective of anasarca 2/23 - CT Chest>>>mod B effusions, consolidation, no ptx   INTERVAL HISTORY:  Anemic.  Tolerating ATC wean.  Family at bedside.    PHYSICAL EXAMINATION: Vital signs: Reviewed, abnormal findings will be addressed in assessment / plan.    General: chronically ill appearing  Neuro: alert, awake, generalized weakness HEENT:  PERRL, #8 trach c/d/i, no bleeding Cardiovascular:  RRR Lungs:  resps even non labored on ATC,  Bilateral diminished air entry, coarse rhonchi bilaterally.   Abdomen:  Dressing intact, large, soft Musculoskeletal:   Moves all extremities, no edema Skin:  Intact except for abd.  LABS: PULMONARY  Recent Labs Lab 09/24/13 1200 09/25/13 0509  PHART 7.291* 7.315*  PCO2ART 55.8* 56.0*  PO2ART 78.3* 166.0*  HCO3 26.0* 27.7*  TCO2 27.7 29.5  O2SAT 96.4 99.7   CBC  Recent Labs Lab 09/23/13 0500 09/23/13 1154 09/24/13 0600 09/25/13 0630  HGB 6.5* 6.2* 7.2* 6.9*  HCT 20.5* 19.4* 22.4* 21.7*  WBC 15.3*  --  12.1* 11.1*  PLT 202  --  203 200   CARDIAC    Recent Labs Lab 09/25/13 0730  TROPONINI <0.30    Recent Labs Lab 09/25/13 0730  PROBNP 5971.0*   CHEMISTRY  Recent Labs Lab 09/22/13 0500 09/23/13 0500 09/24/13 0600 09/24/13 1159 09/25/13 0630  NA 141 148* 150* 148* 149*  K 3.7 3.8 4.0 4.2 4.0  CL 105 114* 113* 111 111  CO2 25 26 27 27 29   GLUCOSE 99 119* 107* 123* 120*  BUN 31* 29* 29* 28* 31*  CREATININE 1.01 1.00 0.97 0.94 0.94  CALCIUM 8.0* 8.1* 8.0* 8.1* 8.1*   The CrCl is unknown because both a height and weight (above a minimum accepted value) are required for this calculation.  LIVER  Recent Labs Lab 09/24/13 1159 09/25/13 0630  INR 1.84* 1.12   INFECTIOUS No results found for this basename: LATICACIDVEN, PROCALCITON,  in the last 168 hours IMAGING x48h  Dg Chest Port 1 View  09/25/2013   CLINICAL DATA Respiratory failure  EXAM PORTABLE CHEST - 1 VIEW  COMPARISON September 24, 2013  FINDINGS Tracheostomy is well seated. Central catheter tip is at the cavoatrial junction. No pneumothorax. There is widespread interstitial and alveolar opacity bilaterally. Heart is mildly enlarged.  The pulmonary vascularity is within normal limits.  IMPRESSION No pneumothorax. Suspect ARDS. Superimposed congestive heart failure and/or pneumonia are possible. One or more than one of these entities may exist concurrently. There is perhaps minimal increase in edema bilaterally compared to 1 day prior.  SIGNATURE  Electronically Signed   By: Lowella Grip M.D.   On: 09/25/2013  07:27   Dg Chest Port 1 View  09/24/2013   CLINICAL DATA:  Respiratory failure  EXAM: PORTABLE CHEST - 1 VIEW  COMPARISON:  09/23/2013  FINDINGS: Cardiopericardial enlargement which is chronic. Unchanged, unremarkable positioning of tracheostomy tube and right IJ catheter.  Worsening diffuse and bilateral airspace disease, basilar predominant. Bilateral pleural effusions which show no evidence of increase. No pneumothorax. Cardiopericardial enlargement.  IMPRESSION: 1. Diffusely worsening lung aeration. The rapid change edema superimposed on the patient's pneumonia. 2. Bilateral pleural effusions without evidence of increase.   Electronically Signed   By: Jorje Guild M.D.   On: 09/24/2013 12:43    ASSESSMENT  Acute on chronic respiratory failure - multifactorial in setting of HCAP, abdominal surgeries, small pleural effusions, pain, atelectasis, anemia and deconditioning Pneumonia - positive for Stenotrophomonas Maltophilia  ARDS +/- cardiogenic pulmonary edema with persistent B infiltrates (CXR 3/11) Atrial fibrillation Tracheostomy status   Hemoptysis - likely due to tracheal erosion vs suction trauma.  Recurrent on 3/11. ? Due to suctioning + elevated INR 1.9 Renal Failure  Anemia - in setting of critical illness, phlebotomy and trach site bleeding S/P Colectomy  PLAN: -Cont ATC wean per protocol as he can tolerate > did not wean well 3/10 likely due to hemoptysis -Trend ABG/CXR -Aim for neutral to negative fluid balance  -Antibiotics per primary team -cultures as above -PRN fentanyl for pain -transfuse PRBC + FFP, Vit K given am 3/11 -if bleeding continues after correction INR then he will need repeat FOB to visualize the erosion or any other source bleeding -mobilize as able, upright positioning, PT efforts -ongoing discussions with family regarding goals of care   *Care during the described time interval was provided by me and/or other providers on the critical care team. I  have reviewed this patient's available data, including medical history, events of note, physical examination and test results as part of my evaluation.   Baltazar Apo, MD, PhD 09/25/2013, 11:47 AM  Pulmonary and Critical Care [phone] or if no answer [phone]

## 2013-09-26 LAB — BASIC METABOLIC PANEL
BUN: 34 mg/dL — AB (ref 6–23)
CALCIUM: 7.9 mg/dL — AB (ref 8.4–10.5)
CO2: 30 mEq/L (ref 19–32)
CREATININE: 0.9 mg/dL (ref 0.50–1.35)
Chloride: 110 mEq/L (ref 96–112)
GFR calc non Af Amer: 84 mL/min — ABNORMAL LOW (ref >90)
Glucose, Bld: 114 mg/dL — ABNORMAL HIGH (ref 70–99)
Potassium: 4.1 mEq/L (ref 3.7–5.3)
Sodium: 150 mEq/L — ABNORMAL HIGH (ref 137–147)

## 2013-09-26 LAB — HEMOGLOBIN AND HEMATOCRIT, BLOOD
HCT: 23.5 % — ABNORMAL LOW (ref 39.0–52.0)
Hemoglobin: 7.6 g/dL — ABNORMAL LOW (ref 13.0–17.0)

## 2013-09-26 LAB — PROTIME-INR
INR: 1.1 (ref 0.00–1.49)
Prothrombin Time: 14 seconds (ref 11.6–15.2)

## 2013-09-26 LAB — CULTURE, RESPIRATORY

## 2013-09-26 LAB — CULTURE, RESPIRATORY W GRAM STAIN

## 2013-09-26 NOTE — Progress Notes (Signed)
PULMONARY  / CRITICAL CARE MEDICINE  Name: Jason Harmon MRN: [id number] DOB: 1940-11-17  CONSULTATION DATE: 09/05/2013  BRIEF PATIENT DESCRIPTION: 73 y/o who initially presented to an outside facility on 1/9 with SBO / ischemic bowel requiring laparotomy and small bowel resection. His course was complicated by ARF, now on dialysis and respiratory failure resulting in tracheostomy and prolonged mechanical ventilation.  Has required multiple surgeries for ischemic bowel with perforation.    CULTURES: BCx2 2/23>>> PICC Tip 2/23>>>neg Tracheal Aspirate 2/23>>>neg UC 2/23>>>neg  ANTIBIOTICS: Zosyn 2/1 >> 2/12  Vancomycin 2/9 >> 2/10  Linezolid 2/10 >> 2/12  Bactrim 2/12 >> stop date 2/27  .....................................................Marland Kitchen Vanco 2/23>>> Meropenem 2/23>>> .....................................................Marland Kitchen ABX per [facility] MD  KEY EVENTS  2/19 - FOB with evidence of tracheal erosion & bleeding.   2/20 - no further hemoptysis  2/23 - Full vent dependent. They still have some blood secretions occ. Through trach. Not following commands.  2/24 - PCT 0.24, lactic acid 1.2 2/25 - on PCV, not weaning 2/27 - improved post R & L thora (1L & 1.3L), more alert 3/11 PCCM called for hemoptysis 3/12 Hemoptysis improved  STUDIES:  2/23 - CT ABD/Pelvis>>s/p colostomy without acute findings, small volume ascites, decompressed bowel, mild mesenteric & soft tissue edema, likely reflective of anasarca 2/23 - CT Chest>>>mod B effusions, consolidation, no ptx   INTERVAL HISTORY: On full support, Hemoptysis improved.   PHYSICAL EXAMINATION: Vital signs: Reviewed, abnormal findings will be addressed in assessment / plan.    General: chronically ill appearing  Neuro: alert, awake, generalized weakness HEENT:  PERRL, #8 trach c/d/i, no bleeding Cardiovascular:  RRR Lungs:  resps even non labored on vent,  Bilateral diminished air entry, coarse rhonchi bilaterally.  No overt  hemoptysis Abdomen:  Dressing intact, large, soft Musculoskeletal:  Moves all extremities, no edema Skin:  Intact except for abd.  LABS: PULMONARY  Recent Labs Lab 09/24/13 1200 09/25/13 0509  PHART 7.291* 7.315*  PCO2ART 55.8* 56.0*  PO2ART 78.3* 166.0*  HCO3 26.0* 27.7*  TCO2 27.7 29.5  O2SAT 96.4 99.7   CBC  Recent Labs Lab 09/23/13 0500  09/24/13 0600 09/25/13 0630 09/26/13 0630  HGB 6.5*  < > 7.2* 6.9* 7.6*  HCT 20.5*  < > 22.4* 21.7* 23.5*  WBC 15.3*  --  12.1* 11.1*  --   PLT 202  --  203 200  --   < > = values in this interval not displayed. CARDIAC    Recent Labs Lab 09/25/13 0730  TROPONINI <0.30    Recent Labs Lab 09/25/13 0730  PROBNP 5971.0*   CHEMISTRY  Recent Labs Lab 09/23/13 0500 09/24/13 0600 09/24/13 1159 09/25/13 0630 09/26/13 0630  NA 148* 150* 148* 149* 150*  K 3.8 4.0 4.2 4.0 4.1  CL 114* 113* 111 111 110  CO2 26 27 27 29 30   GLUCOSE 119* 107* 123* 120* 114*  BUN 29* 29* 28* 31* 34*  CREATININE 1.00 0.97 0.94 0.94 0.90  CALCIUM 8.1* 8.0* 8.1* 8.1* 7.9*   The CrCl is unknown because both a height and weight (above a minimum accepted value) are required for this calculation.  LIVER  Recent Labs Lab 09/24/13 1159 09/25/13 0630 09/26/13 0630  INR 1.84* 1.12 1.10   INFECTIOUS No results found for this basename: LATICACIDVEN, PROCALCITON,  in the last 168 hours IMAGING x48h  Dg Chest Port 1 View  09/25/2013   CLINICAL DATA Respiratory failure  EXAM PORTABLE CHEST - 1 VIEW  COMPARISON September 24, 2013  FINDINGS Tracheostomy is well seated. Central catheter tip is at the cavoatrial junction. No pneumothorax. There is widespread interstitial and alveolar opacity bilaterally. Heart is mildly enlarged. The pulmonary vascularity is within normal limits.  IMPRESSION No pneumothorax. Suspect ARDS. Superimposed congestive heart failure and/or pneumonia are possible. One or more than one of these entities may exist concurrently.  There is perhaps minimal increase in edema bilaterally compared to 1 day prior.  SIGNATURE  Electronically Signed   By: Lowella Grip M.D.   On: 09/25/2013 07:27   Dg Chest Port 1 View  09/24/2013   CLINICAL DATA:  Respiratory failure  EXAM: PORTABLE CHEST - 1 VIEW  COMPARISON:  09/23/2013  FINDINGS: Cardiopericardial enlargement which is chronic. Unchanged, unremarkable positioning of tracheostomy tube and right IJ catheter.  Worsening diffuse and bilateral airspace disease, basilar predominant. Bilateral pleural effusions which show no evidence of increase. No pneumothorax. Cardiopericardial enlargement.  IMPRESSION: 1. Diffusely worsening lung aeration. The rapid change edema superimposed on the patient's pneumonia. 2. Bilateral pleural effusions without evidence of increase.   Electronically Signed   By: Jorje Guild M.D.   On: 09/24/2013 12:43    ASSESSMENT  Acute on chronic respiratory failure - multifactorial in setting of HCAP, abdominal surgeries, small pleural effusions, pain, atelectasis, anemia and deconditioning Pneumonia - positive for Stenotrophomonas Maltophilia  ARDS +/- cardiogenic pulmonary edema with persistent B infiltrates (CXR 3/11) Atrial fibrillation Tracheostomy status   Hemoptysis - likely due to tracheal erosion vs suction trauma.  Recurrent on 3/11. ? Due to suctioning + elevated INR 1.9 Renal Failure  Anemia - in setting of critical illness, phlebotomy and trach site bleeding S/P Colectomy  PLAN: -Cont ATC wean per protocol as he can tolerate > did not wean well 3/10 likely due to hemoptysis -Trend ABG/CXR -Aim for neutral to negative fluid balance  -Antibiotics per primary team -cultures as above -PRN fentanyl for pain -transfuse PRBC + FFP, Vit K given am 3/11 -if bleeding continues after correction INR then he will need repeat FOB to visualize the erosion or any other source bleeding. Note on 3/12 bleeding stopping, INR corrected, no need for FOB on  3/12 -mobilize as able, upright positioning, PT efforts -ongoing discussions with family regarding goals of care   [facility] Minor ACNP Maryanna Shape PCCM Pager [phone] till 3 pm If no answer page [phone] 09/26/2013, 11:10 AM  Baltazar Apo, MD, PhD 09/27/2013, 7:23 AM [facility] [phone] or if no answer [phone]

## 2013-09-27 LAB — TYPE AND SCREEN
ABO/RH(D): A POS
ANTIBODY SCREEN: NEGATIVE
UNIT DIVISION: 0
Unit division: 0
Unit division: 0

## 2013-09-27 LAB — BASIC METABOLIC PANEL
BUN: 35 mg/dL — ABNORMAL HIGH (ref 6–23)
CALCIUM: 8.3 mg/dL — AB (ref 8.4–10.5)
CHLORIDE: 110 meq/L (ref 96–112)
CO2: 31 meq/L (ref 19–32)
Creatinine, Ser: 0.91 mg/dL (ref 0.50–1.35)
GFR calc Af Amer: >90 mL/min (ref >90)
GFR calc non Af Amer: 84 mL/min — ABNORMAL LOW (ref >90)
GLUCOSE: 110 mg/dL — AB (ref 70–99)
Potassium: 4.2 mEq/L (ref 3.7–5.3)
Sodium: 151 mEq/L — ABNORMAL HIGH (ref 137–147)

## 2013-09-28 LAB — BASIC METABOLIC PANEL
BUN: 38 mg/dL — AB (ref 6–23)
CO2: 32 meq/L (ref 19–32)
Calcium: 8.3 mg/dL — ABNORMAL LOW (ref 8.4–10.5)
Chloride: 110 mEq/L (ref 96–112)
Creatinine, Ser: 0.93 mg/dL (ref 0.50–1.35)
GFR calc Af Amer: >90 mL/min (ref >90)
GFR, EST NON AFRICAN AMERICAN: 83 mL/min — AB (ref >90)
GLUCOSE: 121 mg/dL — AB (ref 70–99)
POTASSIUM: 4.5 meq/L (ref 3.7–5.3)
Sodium: 151 mEq/L — ABNORMAL HIGH (ref 137–147)

## 2013-09-29 ENCOUNTER — Other Ambulatory Visit (HOSPITAL_COMMUNITY): Payer: Self-pay

## 2013-09-29 DIAGNOSIS — Z4682 Encounter for fitting and adjustment of non-vascular catheter: Secondary | ICD-10-CM | POA: Diagnosis not present

## 2013-09-29 DIAGNOSIS — J9600 Acute respiratory failure, unspecified whether with hypoxia or hypercapnia: Secondary | ICD-10-CM | POA: Diagnosis not present

## 2013-09-29 DIAGNOSIS — J90 Pleural effusion, not elsewhere classified: Secondary | ICD-10-CM | POA: Diagnosis not present

## 2013-09-29 DIAGNOSIS — R918 Other nonspecific abnormal finding of lung field: Secondary | ICD-10-CM | POA: Diagnosis not present

## 2013-09-29 LAB — BLOOD GAS, ARTERIAL
Acid-Base Excess: 7.1 mmol/L — ABNORMAL HIGH (ref 0.0–2.0)
Bicarbonate: 32.6 mEq/L — ABNORMAL HIGH (ref 20.0–24.0)
FIO2: 0.6 %
LHR: 16 {breaths}/min
MECHVT: 0.45 mL
O2 Saturation: 97.1 %
PATIENT TEMPERATURE: 98.6
PCO2 ART: 60.5 mmHg — AB (ref 35.0–45.0)
PEEP/CPAP: 5 cmH2O
PH ART: 7.35 (ref 7.350–7.450)
TCO2: 34.4 mmol/L (ref 0–100)
pO2, Arterial: 90.3 mmHg (ref 80.0–100.0)

## 2013-09-29 LAB — BASIC METABOLIC PANEL
BUN: 44 mg/dL — AB (ref 6–23)
CHLORIDE: 105 meq/L (ref 96–112)
CO2: 32 mEq/L (ref 19–32)
CREATININE: 1.03 mg/dL (ref 0.50–1.35)
Calcium: 8.3 mg/dL — ABNORMAL LOW (ref 8.4–10.5)
GFR calc non Af Amer: 72 mL/min — ABNORMAL LOW (ref >90)
GFR, EST AFRICAN AMERICAN: 83 mL/min — AB (ref >90)
Glucose, Bld: 119 mg/dL — ABNORMAL HIGH (ref 70–99)
POTASSIUM: 4.6 meq/L (ref 3.7–5.3)
Sodium: 148 mEq/L — ABNORMAL HIGH (ref 137–147)

## 2013-09-29 LAB — CBC
HCT: 23 % — ABNORMAL LOW (ref 39.0–52.0)
Hemoglobin: 7.3 g/dL — ABNORMAL LOW (ref 13.0–17.0)
MCH: 31.2 pg (ref 26.0–34.0)
MCHC: 31.7 g/dL (ref 30.0–36.0)
MCV: 98.3 fL (ref 78.0–100.0)
Platelets: 199 10*3/uL (ref 150–400)
RBC: 2.34 MIL/uL — ABNORMAL LOW (ref 4.22–5.81)
RDW: 16.6 % — AB (ref 11.5–15.5)
WBC: 10 10*3/uL (ref 4.0–10.5)

## 2013-09-29 LAB — PROTIME-INR
INR: 1.06 (ref 0.00–1.49)
Prothrombin Time: 13.6 seconds (ref 11.6–15.2)

## 2013-09-30 ENCOUNTER — Other Ambulatory Visit (HOSPITAL_COMMUNITY): Payer: Self-pay

## 2013-09-30 LAB — BASIC METABOLIC PANEL
BUN: 49 mg/dL — ABNORMAL HIGH (ref 6–23)
CHLORIDE: 105 meq/L (ref 96–112)
CO2: 33 meq/L — AB (ref 19–32)
Calcium: 8.4 mg/dL (ref 8.4–10.5)
Creatinine, Ser: 1.01 mg/dL (ref 0.50–1.35)
GFR calc Af Amer: 85 mL/min — ABNORMAL LOW (ref >90)
GFR calc non Af Amer: 73 mL/min — ABNORMAL LOW (ref >90)
GLUCOSE: 131 mg/dL — AB (ref 70–99)
Potassium: 4.7 mEq/L (ref 3.7–5.3)
SODIUM: 146 meq/L (ref 137–147)

## 2013-09-30 LAB — BLOOD GAS, ARTERIAL
ACID-BASE EXCESS: 7.6 mmol/L — AB (ref 0.0–2.0)
Bicarbonate: 32.4 mEq/L — ABNORMAL HIGH (ref 20.0–24.0)
FIO2: 0.4 %
LHR: 16 {breaths}/min
MECHVT: 450 mL
O2 SAT: 98.4 %
PATIENT TEMPERATURE: 98.6
PEEP/CPAP: 5 cmH2O
TCO2: 34 mmol/L (ref 0–100)
pCO2 arterial: 52.6 mmHg — ABNORMAL HIGH (ref 35.0–45.0)
pH, Arterial: 7.406 (ref 7.350–7.450)
pO2, Arterial: 101 mmHg — ABNORMAL HIGH (ref 80.0–100.0)

## 2013-09-30 NOTE — Procedures (Signed)
Trach Change  Patient has a size 8 cuffed shiley.  Changed to size 6 cuffed distal extra long.  Good volume return.  Bronch placed through trach and was well above carina.  Rush Farmer, M.D. [facility] Pulmonary/Critical Care Medicine. Pager: [phone]. After hours pager: [phone].

## 2013-09-30 NOTE — Procedures (Signed)
Bronchoscopy Procedure Note Arless Vineyard [id number] [birth year]  Procedure: Bronchoscopy Indications: Diagnostic evaluation of the airways  Procedure Details Consent: Unable to obtain consent because of emergent medical necessity. Time Out: Verified patient identification, verified procedure, site/side was marked, verified correct patient position, special equipment/implants available, medications/allergies/relevent history reviewed, required imaging and test results available.  Performed  In preparation for procedure, patient was given 100% FiO2. Sedation: Benzodiazepines and fentanyl  Airway entered and the following bronchi were examined: RUL, RML, RLL, LUL, LLL and Bronchi.   Bronchoscope removed.    Evaluation Hemodynamic Status: BP stable throughout; O2 sats: stable throughout Patient's Current Condition: stable Specimens: None Complications: No apparent complications Patient did tolerate procedure well.  Site of bleeding is the distal end of the trachea where the suction catheter is touching the tracheal ring.  Jennet Maduro 09/30/2013

## 2013-09-30 NOTE — Progress Notes (Signed)
PULMONARY  / CRITICAL CARE MEDICINE  Name: Jason Harmon MRN: [id number] DOB: 1941-06-06  CONSULTATION DATE: 09/05/2013  BRIEF PATIENT DESCRIPTION: 73 y/o who initially presented to an outside facility on 1/9 with SBO / ischemic bowel requiring laparotomy and small bowel resection. His course was complicated by ARF, now on dialysis and respiratory failure resulting in tracheostomy and prolonged mechanical ventilation.  Has required multiple surgeries for ischemic bowel with perforation.    CULTURES: BCx2 2/23>>> PICC Tip 2/23>>>neg Tracheal Aspirate 2/23>>>neg UC 2/23>>>neg  ANTIBIOTICS: Zosyn 2/1 >> 2/12  Vancomycin 2/9 >> 2/10  Linezolid 2/10 >> 2/12  Bactrim 2/12 >> stop date 2/27  .....................................................Marland Kitchen Vanco 2/23>>> Meropenem 2/23>>> .....................................................Marland Kitchen ABX per [facility] MD  KEY EVENTS  2/19 - FOB with evidence of tracheal erosion & bleeding.   2/20 - no further hemoptysis  2/23 - Full vent dependent. They still have some blood secretions occ. Through trach. Not following commands.  2/24 - PCT 0.24, lactic acid 1.2 2/25 - on PCV, not weaning 2/27 - improved post R & L thora (1L & 1.3L), more alert 3/11 PCCM called for hemoptysis 3/12 Hemoptysis improved 3/16 copious hemoptysis plan fob   STUDIES:  2/23 - CT ABD/Pelvis>>s/p colostomy without acute findings, small volume ascites, decompressed bowel, mild mesenteric & soft tissue edema, likely reflective of anasarca 2/23 - CT Chest>>>mod B effusions, consolidation, no ptx 3/16 fob>>  INTERVAL HISTORY: On full support, Hemoptysis worse.   PHYSICAL EXAMINATION: Vital signs: Reviewed, abnormal findings will be addressed in assessment / plan.   Desaturation  During hemoptysis episodes General: chronically ill appearing  Neuro: alert, awake, generalized weakness HEENT:  PERRL, #8 trach c/d/i, copious hemoptysis  Cardiovascular:  RRR Lungs:  Increased wob,  coarse  bilaterally.  +++ hemoptysis Abdomen:  Dressing intact, large, soft Musculoskeletal:  Moves all extremities, no edema Skin:  Intact except for abd.  LABS: PULMONARY  Recent Labs Lab 09/24/13 1200 09/25/13 0509 09/29/13 0949 09/30/13 0533  PHART 7.291* 7.315* 7.350 7.406  PCO2ART 55.8* 56.0* 60.5* 52.6*  PO2ART 78.3* 166.0* 90.3 101.0*  HCO3 26.0* 27.7* 32.6* 32.4*  TCO2 27.7 29.5 34.4 34.0  O2SAT 96.4 99.7 97.1 98.4   CBC  Recent Labs Lab 09/24/13 0600 09/25/13 0630 09/26/13 0630 09/29/13 0500  HGB 7.2* 6.9* 7.6* 7.3*  HCT 22.4* 21.7* 23.5* 23.0*  WBC 12.1* 11.1*  --  10.0  PLT 203 200  --  199   CARDIAC    Recent Labs Lab 09/25/13 0730  TROPONINI <0.30    Recent Labs Lab 09/25/13 0730  PROBNP 5971.0*   CHEMISTRY  Recent Labs Lab 09/26/13 0630 09/27/13 0650 09/28/13 0605 09/29/13 0500 09/30/13 0500  NA 150* 151* 151* 148* 146  K 4.1 4.2 4.5 4.6 4.7  CL 110 110 110 105 105  CO2 30 31 32 32 33*  GLUCOSE 114* 110* 121* 119* 131*  BUN 34* 35* 38* 44* 49*  CREATININE 0.90 0.91 0.93 1.03 1.01  CALCIUM 7.9* 8.3* 8.3* 8.3* 8.4   The CrCl is unknown because both a height and weight (above a minimum accepted value) are required for this calculation.  LIVER  Recent Labs Lab 09/24/13 1159 09/25/13 0630 09/26/13 0630 09/29/13 0500  INR 1.84* 1.12 1.10 1.06   INFECTIOUS No results found for this basename: LATICACIDVEN, PROCALCITON,  in the last 168 hours IMAGING x48h  Dg Chest Port 1 View  09/29/2013   CLINICAL DATA:  Pulmonary infiltrates.  EXAM: PORTABLE CHEST - 1 VIEW  COMPARISON:  09/25/2013  and 09/24/2013  FINDINGS: Central venous catheter and tracheostomy tube appear in good position. There has been marked improvement in the bilateral diffuse pulmonary infiltrates, particularly on the right. There is a small left pleural effusion, unchanged. Heart size and pulmonary vascularity are normal.  IMPRESSION: Significant improvement in  bilateral pulmonary infiltrates. Persistent small left effusion.   Electronically Signed   By: Rozetta Nunnery M.D.   On: 09/29/2013 18:24    ASSESSMENT  Acute on chronic respiratory failure - multifactorial in setting of HCAP, abdominal surgeries, small pleural effusions, pain, atelectasis, anemia and deconditioning Pneumonia - positive for Stenotrophomonas Maltophilia  ARDS +/- cardiogenic pulmonary edema with persistent B infiltrates (CXR 3/11) Atrial fibrillation Tracheostomy status   Hemoptysis - likely due to tracheal erosion vs suction trauma.  Recurrent on 3/11. ? Due to suctioning + elevated INR 1.9 Recurrent on 3/16 inr 1.06 and no anticoagulation. Renal Failure  Anemia - in setting of critical illness, phlebotomy and trach site bleeding S/P Colectomy  PLAN: -Cont ATC wean per protocol as he can tolerate > did not wean well 3/16 likely due to hemoptysis Will do FOB 3/16 eval hemoptysis, shows erosion at distal trach. Will place distal xl trach. -Trend ABG/CXR -Aim for neutral to negative fluid balance  -Antibiotics per primary team -cultures as above -PRN fentanyl for pain -mobilize as able, upright positioning, PT efforts -ongoing discussions with family regarding goals of care   [facility] ACNP Maryanna Shape PCCM Pager [phone] till 3 pm If no answer page [phone] 09/30/2013, 9:42 AM  Persistent hemoptysis, bronched today, site of bleeding remains the entrance area of the suction catheter given angle of the tracheostomy.  After examination, will change the trach to a cuffless 6 distal extralong.  CC time 35 min.  Patient seen and examined, agree with above note.  I dictated the care and orders written for this patient under my direction.  Rush Farmer, MD [phone]

## 2013-09-30 NOTE — Progress Notes (Signed)
[facility]                                                                                              Progress note     Patient Demographics  Jason Harmon, is a 73 y.o. male  XHB:[id number]  [id number]:[id number]  DOB - Dec 02, 1940  Admit date - 09/04/2013  Admitting Physician Merton Border, MD  Outpatient Primary MD for the patient is Pcp Not In System  LOS - 62   Chief complaint     Respiratory failure     Peritonitis       Subjective:   Unable to obtain due to patient's condition and mechanical ventilation  Objective:   Vital signs  Temperature 97.6 Heart rate 107 Respiratory rate 23 Blood pressure 164/99 Pulse ox 98%    Exam Awake and alert, No new F.N deficits,  [place].AT Supple Neck,No JVD, No cervical lymphadenopathy appriciated.  Tracheostomy noted at midline Symmetrical Chest wall movement, decreased breath sounds bilaterally with scattered rhonchi RRR,No Gallops,Rubs or new Murmurs, No Parasternal Heave +ve B.Sounds, Abd Soft, Non tender, No organomegaly appriciated, No rebound - guarding or rigidity. PEG tube and colostomy noted No Cyanosis, Clubbing or edema,  Multiple wounds noted  I&Os 2180/1960 Foley - no Trach #8 Shiley   Data Review   CBC  Recent Labs Lab 09/24/13 0600 09/25/13 0630 09/26/13 0630 09/29/13 0500  WBC 12.1* 11.1*  --  10.0  HGB 7.2* 6.9* 7.6* 7.3*  HCT 22.4* 21.7* 23.5* 23.0*  PLT 203 200  --  199  MCV 99.1 100.0  --  98.3  MCH 31.9 31.8  --  31.2  MCHC 32.1 31.8  --  31.7  RDW 15.8* 16.0*  --  16.6*    Chemistries   Recent Labs Lab 09/26/13 0630 09/27/13 0650 09/28/13 0605 09/29/13 0500 09/30/13 0500  NA 150* 151* 151* 148* 146  K 4.1 4.2 4.5 4.6 4.7  CL 110 110 110 105 105  CO2 30 31 32 32 33*  GLUCOSE 114* 110* 121* 119* 131*  BUN 34* 35* 38* 44* 49*  CREATININE 0.90 0.91 0.93 1.03 1.01  CALCIUM 7.9* 8.3* 8.3* 8.3*  8.4    Micro Results     Assessment & Plan   VDRF; his FiO2 was increased to 60% with 5 of PEEP after a bronchoscopy which was done today. Could not wean his chest x-ray showing bilateral air space disease consistent with ARDS. 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

## 2013-10-01 ENCOUNTER — Other Ambulatory Visit (HOSPITAL_COMMUNITY): Payer: Self-pay

## 2013-10-01 LAB — CBC
HCT: 20 % — ABNORMAL LOW (ref 39.0–52.0)
Hemoglobin: 6.3 g/dL — CL (ref 13.0–17.0)
MCH: 30.6 pg (ref 26.0–34.0)
MCHC: 31.5 g/dL (ref 30.0–36.0)
MCV: 97.1 fL (ref 78.0–100.0)
Platelets: 194 10*3/uL (ref 150–400)
RBC: 2.06 MIL/uL — ABNORMAL LOW (ref 4.22–5.81)
RDW: 16.1 % — AB (ref 11.5–15.5)
WBC: 12.1 10*3/uL — ABNORMAL HIGH (ref 4.0–10.5)

## 2013-10-01 LAB — BASIC METABOLIC PANEL
BUN: 50 mg/dL — AB (ref 6–23)
CALCIUM: 8.2 mg/dL — AB (ref 8.4–10.5)
CO2: 35 mEq/L — ABNORMAL HIGH (ref 19–32)
CREATININE: 1.03 mg/dL (ref 0.50–1.35)
Chloride: 107 mEq/L (ref 96–112)
GFR calc Af Amer: 83 mL/min — ABNORMAL LOW (ref >90)
GFR, EST NON AFRICAN AMERICAN: 72 mL/min — AB (ref >90)
GLUCOSE: 124 mg/dL — AB (ref 70–99)
Potassium: 4.7 mEq/L (ref 3.7–5.3)
Sodium: 150 mEq/L — ABNORMAL HIGH (ref 137–147)

## 2013-10-01 LAB — PREPARE RBC (CROSSMATCH)

## 2013-10-01 NOTE — Consult Note (Signed)
I have reviewed and discussed the care of this patient in detail with the nurse practitioner including pertinent patient records, physical exam findings and data. I agree with details of this encounter.  

## 2013-10-01 NOTE — Progress Notes (Signed)
[facility]                                                                                              Progress note     Patient Demographics  Jason Harmon, is a 73 y.o. male  ZTI:[id number]  ASN:[id number]  DOB - 1941-05-10  Admit date - 09/04/2013  Admitting Physician Merton Border, MD  Outpatient Primary MD for the patient is Pcp Not In System  LOS - 27   Chief complaint     Respiratory failure     Peritonitis       Subjective:   Unable to obtain due to patient's condition and mechanical ventilation  Objective:   Vital signs  Temperature 98.3 Heart rate 104 Respiratory rate 34 Blood pressure 151/95 Pulse ox 100%    Exam Awake and alert, No new F.N deficits,  [place].AT Supple Neck,No JVD, No cervical lymphadenopathy appriciated.  Tracheostomy noted at midline Symmetrical Chest wall movement, decreased breath sounds bilaterally with scattered rhonchi RRR,No Gallops,Rubs or new Murmurs, No Parasternal Heave +ve B.Sounds, Abd Soft, Non tender, No organomegaly appriciated, No rebound - guarding or rigidity. PEG tube and colostomy noted No Cyanosis, Clubbing or edema,  Multiple wounds noted  I&Os 2469/3300 Foley - no Trach #8 Shiley   Data Review   CBC  Recent Labs Lab 09/25/13 0630 09/26/13 0630 09/29/13 0500 10/01/13 0500  WBC 11.1*  --  10.0 12.1*  HGB 6.9* 7.6* 7.3* 6.3*  HCT 21.7* 23.5* 23.0* 20.0*  PLT 200  --  199 194  MCV 100.0  --  98.3 97.1  MCH 31.8  --  31.2 30.6  MCHC 31.8  --  31.7 31.5  RDW 16.0*  --  16.6* 16.1*    Chemistries   Recent Labs Lab 09/27/13 0650 09/28/13 0605 09/29/13 0500 09/30/13 0500 10/01/13 0500  NA 151* 151* 148* 146 150*  K 4.2 4.5 4.6 4.7 4.7  CL 110 110 105 105 107  CO2 31 32 32 33* 35*  GLUCOSE 110* 121* 119* 131* 124*  BUN 35* 38* 44* 49* 50*  CREATININE 0.91 0.93 1.03 1.01 1.03  CALCIUM 8.3* 8.3* 8.3* 8.4  8.2*    Micro Results     Assessment & Plan   VDRF; ARDS continue on full support  Bowel perforation status post colostomy Moderate bilateral pleural effusion status post thoracentesis 2/25 Renal failure;  resolved Atrial fib heart rate is improving, continue with the digoxin and Lopressor Hemoptysis status post bronchoscopy on 219 that showed trachea erosions 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

## 2013-10-02 LAB — BASIC METABOLIC PANEL
BUN: 49 mg/dL — ABNORMAL HIGH (ref 6–23)
CHLORIDE: 104 meq/L (ref 96–112)
CO2: 34 meq/L — AB (ref 19–32)
Calcium: 8.5 mg/dL (ref 8.4–10.5)
Creatinine, Ser: 1.07 mg/dL (ref 0.50–1.35)
GFR calc Af Amer: 79 mL/min — ABNORMAL LOW (ref >90)
GFR calc non Af Amer: 68 mL/min — ABNORMAL LOW (ref >90)
Glucose, Bld: 109 mg/dL — ABNORMAL HIGH (ref 70–99)
Potassium: 4.8 mEq/L (ref 3.7–5.3)
Sodium: 147 mEq/L (ref 137–147)

## 2013-10-02 LAB — TYPE AND SCREEN
ABO/RH(D): A POS
Antibody Screen: NEGATIVE
UNIT DIVISION: 0
Unit division: 0

## 2013-10-02 NOTE — Progress Notes (Signed)
PULMONARY  / CRITICAL CARE MEDICINE  Name: Jason Harmon MRN: [id number] DOB: 20-Feb-1941  CONSULTATION DATE: 09/05/2013  BRIEF PATIENT DESCRIPTION: 73 y/o who initially presented to an outside facility on 1/9 with SBO / ischemic bowel requiring laparotomy and small bowel resection. His course was complicated by ARF, now on dialysis and respiratory failure resulting in tracheostomy and prolonged mechanical ventilation.  Has required multiple surgeries for ischemic bowel with perforation.    CULTURES: BCx2 2/23>>> PICC Tip 2/23>>>neg Tracheal Aspirate 2/23>>>neg UC 2/23>>>neg  ANTIBIOTICS: Zosyn 2/1 >> 2/12  Vancomycin 2/9 >> 2/10  Linezolid 2/10 >> 2/12  Bactrim 2/12 >> stop date 2/27  .....................................................Marland Kitchen Vanco 2/23>>> Meropenem 2/23>>> .....................................................Marland Kitchen ABX per [facility] MD  KEY EVENTS  2/19 - FOB with evidence of tracheal erosion & bleeding.   2/20 - no further hemoptysis  2/23 - Full vent dependent. They still have some blood secretions occ. Through trach. Not following commands.  2/24 - PCT 0.24, lactic acid 1.2 2/25 - on PCV, not weaning 2/27 - improved post R & L thora (1L & 1.3L), more alert 3/11 PCCM called for hemoptysis 3/12 Hemoptysis improved 3/16 copious hemoptysis plan fob   STUDIES:  2/23 - CT ABD/Pelvis>>s/p colostomy without acute findings, small volume ascites, decompressed bowel, mild mesenteric & soft tissue edema, likely reflective of anasarca 2/23 - CT Chest>>>mod B effusions, consolidation, no ptx 3/16 fob>>bleeding from where the suction catheter contacts the airway from angle of shiley tracheostomy.  INTERVAL HISTORY: On full support, Hemoptysis resolved.  PHYSICAL EXAMINATION: Vital signs: Reviewed, abnormal findings will be addressed in assessment / plan.   Desaturation  During hemoptysis episodes General: chronically ill appearing  Neuro: alert, awake, generalized  weakness HEENT:  PERRL, #8 trach c/d/i, copious hemoptysis  Cardiovascular:  RRR Lungs:  Increased wob, coarse  bilaterally.  +++ hemoptysis Abdomen:  Dressing intact, large, soft Musculoskeletal:  Moves all extremities, no edema Skin:  Intact except for abd.  LABS: PULMONARY  Recent Labs Lab 09/29/13 0949 09/30/13 0533  PHART 7.350 7.406  PCO2ART 60.5* 52.6*  PO2ART 90.3 101.0*  HCO3 32.6* 32.4*  TCO2 34.4 34.0  O2SAT 97.1 98.4   CBC  Recent Labs Lab 09/26/13 0630 09/29/13 0500 10/01/13 0500  HGB 7.6* 7.3* 6.3*  HCT 23.5* 23.0* 20.0*  WBC  --  10.0 12.1*  PLT  --  199 194   CARDIAC   No results found for this basename: TROPONINI,  in the last 168 hours No results found for this basename: PROBNP,  in the last 168 hours CHEMISTRY  Recent Labs Lab 09/28/13 0605 09/29/13 0500 09/30/13 0500 10/01/13 0500 10/02/13 0500  NA 151* 148* 146 150* 147  K 4.5 4.6 4.7 4.7 4.8  CL 110 105 105 107 104  CO2 32 32 33* 35* 34*  GLUCOSE 121* 119* 131* 124* 109*  BUN 38* 44* 49* 50* 49*  CREATININE 0.93 1.03 1.01 1.03 1.07  CALCIUM 8.3* 8.3* 8.4 8.2* 8.5   The CrCl is unknown because both a height and weight (above a minimum accepted value) are required for this calculation.  LIVER  Recent Labs Lab 09/26/13 0630 09/29/13 0500  INR 1.10 1.06   INFECTIOUS No results found for this basename: LATICACIDVEN, PROCALCITON,  in the last 168 hours IMAGING x48h  Dg Chest Port 1 View  10/01/2013   CLINICAL DATA:  Respiratory failure  EXAM: PORTABLE CHEST - 1 VIEW  COMPARISON:  Portable chest x-ray of 09/30/2013  FINDINGS: There is slight worsening of patchy  airspace disease most consistent with edema as well as basilar atelectasis. There may be effusions present. Cardiomegaly is stable. Tracheostomy and right IJ central venous line are unchanged.  IMPRESSION: Worsening of airspace disease most consistent with edema and probable effusions.   Electronically Signed   By: Ivar Drape M.D.   On: 10/01/2013 08:33    ASSESSMENT  Acute on chronic respiratory failure - multifactorial in setting of HCAP, abdominal surgeries, small pleural effusions, pain, atelectasis, anemia and deconditioning Pneumonia - positive for Stenotrophomonas Maltophilia  ARDS +/- cardiogenic pulmonary edema with persistent B infiltrates (CXR 3/11) Atrial fibrillation Tracheostomy status   Hemoptysis - likely due to tracheal erosion vs suction trauma.  Recurrent on 3/11. ? Due to suctioning + elevated INR 1.9 Recurrent on 3/16 inr 1.06 and no anticoagulation. Renal Failure  Anemia - in setting of critical illness, phlebotomy and trach site bleeding S/P Colectomy Worsening ARDS PLAN: - Deteriorated from a respiratory standpoint, now requiring full vent support, able to tolerate 12/5 for one hour. - Decrease PEEP to 5 and FiO2 to 40%. - Continue to push for PS trials per protocol. - Trend ABG/CXR - Needs aggressive diureses. - Antibiotics noted. - Cultures noted. - PRN fentanyl for pain - Mobilize as able, upright positioning, PT efforts - Ongoing discussions with family regarding goals of care as patient's functional status has been very poor and he is deteriorating.  Will need vent SNF, unlikely to liberate.  Rush Farmer, M.D. [facility] Pulmonary/Critical Care Medicine. Pager: [phone]. After hours pager: [phone].

## 2013-10-02 NOTE — Progress Notes (Signed)
[facility]                                                                                              Progress note     Patient Demographics  Jason Harmon, is a 73 y.o. male  [id number]:[id number]  [id number]  DOB - 1941-03-27  Admit date - 09/04/2013  Admitting Physician Merton Border, MD  Outpatient Primary MD for the patient is Pcp Not In System  LOS - 85   Chief complaint     Respiratory failure     Peritonitis       Subjective:   Unable to obtain due to patient's condition and mechanical ventilation  Objective:   Vital signs  Temperature 98.9 Heart rate 104 Respiratory rate 36 Blood pressure 162/114 Pulse ox 98%    Exam Awake and alert, No new F.N deficits,  [place].AT Supple Neck,No JVD, No cervical lymphadenopathy appriciated.  Tracheostomy noted at midline Symmetrical Chest wall movement, decreased breath sounds bilaterally with scattered rhonchi RRR,No Gallops,Rubs or new Murmurs, No Parasternal Heave +ve B.Sounds, Abd Soft, Non tender, No organomegaly appriciated, No rebound - guarding or rigidity. PEG tube and colostomy noted No Cyanosis, Clubbing or edema,  Multiple wounds noted  I&Os 3356/2750 Foley - no Trach #8 Shiley   Data Review   CBC  Recent Labs Lab 09/26/13 0630 09/29/13 0500 10/01/13 0500  WBC  --  10.0 12.1*  HGB 7.6* 7.3* 6.3*  HCT 23.5* 23.0* 20.0*  PLT  --  199 194  MCV  --  98.3 97.1  MCH  --  31.2 30.6  MCHC  --  31.7 31.5  RDW  --  16.6* 16.1*    Chemistries   Recent Labs Lab 09/28/13 0605 09/29/13 0500 09/30/13 0500 10/01/13 0500 10/02/13 0500  NA 151* 148* 146 150* 147  K 4.5 4.6 4.7 4.7 4.8  CL 110 105 105 107 104  CO2 32 32 33* 35* 34*  GLUCOSE 121* 119* 131* 124* 109*  BUN 38* 44* 49* 50* 49*  CREATININE 0.93 1.03 1.01 1.03 1.07  CALCIUM 8.3* 8.3* 8.4 8.2* 8.5    Micro Results     Assessment & Plan    VDRF; ARDS continue on full support  Bowel perforation status post colostomy Moderate bilateral pleural effusion status post thoracentesis 2/25 Renal failure;  resolved Atrial fib heart rate is improving, continue with the digoxin and Lopressor Hemoptysis status post bronchoscopy on 219 that showed trachea erosions 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 We'll continue with treatment  Discussed with pulmonary critical care and ID Code Status: Limited DVT Prophylaxis  SCDs   Merton Border M.D on 10/02/2013 at 5:52 PM

## 2013-10-03 LAB — HEMOGLOBIN AND HEMATOCRIT, BLOOD
HEMATOCRIT: 27.6 % — AB (ref 39.0–52.0)
Hemoglobin: 8.9 g/dL — ABNORMAL LOW (ref 13.0–17.0)

## 2013-10-03 LAB — BASIC METABOLIC PANEL
BUN: 47 mg/dL — ABNORMAL HIGH (ref 6–23)
CO2: 33 meq/L — AB (ref 19–32)
Calcium: 8.4 mg/dL (ref 8.4–10.5)
Chloride: 98 mEq/L (ref 96–112)
Creatinine, Ser: 1.11 mg/dL (ref 0.50–1.35)
GFR calc Af Amer: 76 mL/min — ABNORMAL LOW (ref >90)
GFR calc non Af Amer: 65 mL/min — ABNORMAL LOW (ref >90)
Glucose, Bld: 132 mg/dL — ABNORMAL HIGH (ref 70–99)
Potassium: 4.6 mEq/L (ref 3.7–5.3)
SODIUM: 139 meq/L (ref 137–147)

## 2013-10-03 NOTE — Progress Notes (Signed)
[facility]                                                                                              Progress note     Patient Demographics  Jason Harmon, is a 73 y.o. male  XTG:[id number]  [id number]:[id number]  DOB - [birth year]  Admit date - 09/04/2013  Admitting Physician Merton Border, MD  Outpatient Primary MD for the patient is Pcp Not In System  LOS - 29   Chief complaint     Respiratory failure     Peritonitis       Subjective:   Unable to obtain due to patient's condition and mechanical ventilation  Objective:   Vital signs  Temperature 98.5 Heart rate 99 Respiratory rate 20 Blood pressure 156/106 Pulse ox 97%    Exam Awake and alert, No new F.N deficits,  [place].AT Supple Neck,No JVD, No cervical lymphadenopathy appriciated.  Tracheostomy noted at midline Symmetrical Chest wall movement, decreased breath sounds bilaterally with scattered rhonchi RRR,No Gallops,Rubs or new Murmurs, No Parasternal Heave +ve B.Sounds, Abd Soft, Non tender, No organomegaly appriciated, No rebound - guarding or rigidity. PEG tube and colostomy noted No Cyanosis, Clubbing or edema,  Multiple wounds noted  I&Os 4115/? Foley - no Trach #8 Shiley   Data Review   CBC  Recent Labs Lab 09/29/13 0500 10/01/13 0500 10/03/13 1139  WBC 10.0 12.1*  --   HGB 7.3* 6.3* 8.9*  HCT 23.0* 20.0* 27.6*  PLT 199 194  --   MCV 98.3 97.1  --   MCH 31.2 30.6  --   MCHC 31.7 31.5  --   RDW 16.6* 16.1*  --     Chemistries   Recent Labs Lab 09/29/13 0500 09/30/13 0500 10/01/13 0500 10/02/13 0500 10/03/13 0505  NA 148* 146 150* 147 139  K 4.6 4.7 4.7 4.8 4.6  CL 105 105 107 104 98  CO2 32 33* 35* 34* 33*  GLUCOSE 119* 131* 124* 109* 132*  BUN 44* 49* 50* 49* 47*  CREATININE 1.03 1.01 1.03 1.07 1.11  CALCIUM 8.3* 8.4 8.2* 8.5 8.4    Micro Results     Assessment & Plan   VDRF;  ARDS continue on full support his oxygen was increased to 70%. 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 We'll continue with treatment  Discussed with pulmonary critical care and ID Code Status: Limited DVT Prophylaxis  SCDs   Merton Border M.D on 10/03/2013 at 3:10 PM

## 2013-10-04 NOTE — Progress Notes (Signed)
PULMONARY  / CRITICAL CARE MEDICINE  Name: Thorsten Climer MRN: [id number] DOB: [birth year]  CONSULTATION DATE: 09/05/2013  BRIEF PATIENT DESCRIPTION: 73 y/o who initially presented to an outside facility on 1/9 with SBO / ischemic bowel requiring laparotomy and small bowel resection. His course was complicated by ARF, now on dialysis and respiratory failure resulting in tracheostomy and prolonged mechanical ventilation.  Has required multiple surgeries for ischemic bowel with perforation.    CULTURES: BCx2 2/23>>> PICC Tip 2/23>>>neg Tracheal Aspirate 2/23>>>neg UC 2/23>>>neg  ANTIBIOTICS: Zosyn 2/1 >> 2/12  Vancomycin 2/9 >> 2/10  Linezolid 2/10 >> 2/12  Bactrim 2/12 >> stop date 2/27  .....................................................Marland Kitchen Vanco 2/23>>> Meropenem 2/23>>> .....................................................Marland Kitchen ABX per [facility] MD  KEY EVENTS  2/19 - FOB with evidence of tracheal erosion & bleeding.   2/20 - no further hemoptysis  2/23 - Full vent dependent. They still have some blood secretions occ. Through trach. Not following commands.  2/24 - PCT 0.24, lactic acid 1.2 2/25 - on PCV, not weaning 2/27 - improved post R & L thora (1L & 1.3L), more alert 3/11 PCCM called for hemoptysis 3/12 Hemoptysis improved 3/16 copious hemoptysis plan fob   STUDIES:  2/23 - CT ABD/Pelvis>>s/p colostomy without acute findings, small volume ascites, decompressed bowel, mild mesenteric & soft tissue edema, likely reflective of anasarca 2/23 - CT Chest>>>mod B effusions, consolidation, no ptx 3/16 fob>>bleeding from where the suction catheter contacts the airway from angle of shiley tracheostomy.  INTERVAL HISTORY: On full support, Hemoptysis resolved.  PHYSICAL EXAMINATION: Vital signs: Reviewed, abnormal findings will be addressed in assessment / plan.   Desaturation  During hemoptysis episodes General: chronically ill appearing  Neuro: alert, awake, generalized  weakness HEENT:  PERRL, #8 trach c/d/i, copious hemoptysis  Cardiovascular:  RRR Lungs:  Increased wob, coarse  bilaterally.  +++ hemoptysis Abdomen:  Dressing intact, large, soft Musculoskeletal:  Moves all extremities, no edema Skin:  Intact except for abd.  LABS: PULMONARY  Recent Labs Lab 09/29/13 0949 09/30/13 0533  PHART 7.350 7.406  PCO2ART 60.5* 52.6*  PO2ART 90.3 101.0*  HCO3 32.6* 32.4*  TCO2 34.4 34.0  O2SAT 97.1 98.4   CBC  Recent Labs Lab 09/29/13 0500 10/01/13 0500 10/03/13 1139  HGB 7.3* 6.3* 8.9*  HCT 23.0* 20.0* 27.6*  WBC 10.0 12.1*  --   PLT 199 194  --    CARDIAC   No results found for this basename: TROPONINI,  in the last 168 hours No results found for this basename: PROBNP,  in the last 168 hours CHEMISTRY  Recent Labs Lab 09/29/13 0500 09/30/13 0500 10/01/13 0500 10/02/13 0500 10/03/13 0505  NA 148* 146 150* 147 139  K 4.6 4.7 4.7 4.8 4.6  CL 105 105 107 104 98  CO2 32 33* 35* 34* 33*  GLUCOSE 119* 131* 124* 109* 132*  BUN 44* 49* 50* 49* 47*  CREATININE 1.03 1.01 1.03 1.07 1.11  CALCIUM 8.3* 8.4 8.2* 8.5 8.4   The CrCl is unknown because both a height and weight (above a minimum accepted value) are required for this calculation.  LIVER  Recent Labs Lab 09/29/13 0500  INR 1.06   INFECTIOUS No results found for this basename: LATICACIDVEN, PROCALCITON,  in the last 168 hours IMAGING x48h  No results found.  ASSESSMENT  Acute on chronic respiratory failure - multifactorial in setting of HCAP, abdominal surgeries, small pleural effusions, pain, atelectasis, anemia and deconditioning Pneumonia - positive for Stenotrophomonas Maltophilia  ARDS +/- cardiogenic pulmonary edema with  persistent B infiltrates (CXR 3/11) Atrial fibrillation Tracheostomy status   Hemoptysis - likely due to tracheal erosion vs suction trauma.  Recurrent on 3/11. ? Due to suctioning + elevated INR 1.9 Recurrent on 3/16 inr 1.06 and no  anticoagulation. Renal Failure  Anemia - in setting of critical illness, phlebotomy and trach site bleeding S/P Colectomy Worsening ARDS PLAN: - Deteriorated from a respiratory standpoint, now requiring full vent support, unable to tolerate weaning. - Had to increase FiO2 70% overnight, decrease PEEP to 5 and FiO2 to 50%. - Continue to push for PS trials per protocol if able to tolerate 50% FiO2. - Trend ABG/CXR - Needs aggressive diureses. - Antibiotics noted. - Cultures noted. - PRN fentanyl for pain - Mobilize as able, upright positioning, PT efforts - Ongoing discussions with family regarding goals of care as patient's functional status has been very poor and he is deteriorating. - Begin arranging for vent SNF if family continues to wish for full support.  Rush Farmer, M.D. [facility] Pulmonary/Critical Care Medicine. Pager: [phone]. After hours pager: [phone].

## 2013-10-04 NOTE — Progress Notes (Signed)
[facility]                                                                                              Progress note     Patient Demographics  Jason Harmon, is a 73 y.o. male  HCW:[id number]  [id number]  DOB - 1940-12-30  Admit date - 09/04/2013  Admitting Physician Merton Border, MD  Outpatient Primary MD for the patient is Pcp Not In System  LOS - 105   Chief complaint     Respiratory failure     Peritonitis       Subjective:   Unable to obtain due to patient's condition and mechanical ventilation  Objective:   Vital signs  Temperature 97.1 Heart rate 84 Respiratory rate 27 Blood pressure 122/84 Pulse ox 99%    Exam Awake and alert, No new F.N deficits,  [place].AT Supple Neck,No JVD, No cervical lymphadenopathy appriciated.  Tracheostomy noted at midline Symmetrical Chest wall movement, decreased breath sounds bilaterally with scattered rhonchi RRR,No Gallops,Rubs or new Murmurs, No Parasternal Heave +ve B.Sounds, Abd Soft, Non tender, No organomegaly appriciated, No rebound - guarding or rigidity. PEG tube and colostomy noted No Cyanosis, Clubbing or edema,  Multiple wounds noted  I&Os 2133/1875 Foley - no Trach #8 Shiley   Data Review   CBC  Recent Labs Lab 09/29/13 0500 10/01/13 0500 10/03/13 1139  WBC 10.0 12.1*  --   HGB 7.3* 6.3* 8.9*  HCT 23.0* 20.0* 27.6*  PLT 199 194  --   MCV 98.3 97.1  --   MCH 31.2 30.6  --   MCHC 31.7 31.5  --   RDW 16.6* 16.1*  --     Chemistries   Recent Labs Lab 09/29/13 0500 09/30/13 0500 10/01/13 0500 10/02/13 0500 10/03/13 0505  NA 148* 146 150* 147 139  K 4.6 4.7 4.7 4.8 4.6  CL 105 105 107 104 98  CO2 32 33* 35* 34* 33*  GLUCOSE 119* 131* 124* 109* 132*  BUN 44* 49* 50* 49* 47*  CREATININE 1.03 1.01 1.03 1.07 1.11  CALCIUM 8.3* 8.4 8.2* 8.5 8.4    Micro Results     Assessment & Plan   VDRF;  ARDS continue on full support FiO2 60% PEEP of 8. 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 We'll continue with treatment  Discussed with pulmonary critical care and ID Code Status: Limited DVT Prophylaxis  SCDs   Merton Border M.D on 10/04/2013 at 1:30 PM

## 2013-10-05 ENCOUNTER — Other Ambulatory Visit (HOSPITAL_COMMUNITY): Payer: Self-pay

## 2013-10-05 DIAGNOSIS — J811 Chronic pulmonary edema: Secondary | ICD-10-CM | POA: Diagnosis not present

## 2013-10-05 LAB — BASIC METABOLIC PANEL
BUN: 45 mg/dL — ABNORMAL HIGH (ref 6–23)
CO2: 33 meq/L — AB (ref 19–32)
Calcium: 8.5 mg/dL (ref 8.4–10.5)
Chloride: 98 mEq/L (ref 96–112)
Creatinine, Ser: 1.14 mg/dL (ref 0.50–1.35)
GFR calc Af Amer: 73 mL/min — ABNORMAL LOW (ref >90)
GFR calc non Af Amer: 63 mL/min — ABNORMAL LOW (ref >90)
Glucose, Bld: 119 mg/dL — ABNORMAL HIGH (ref 70–99)
POTASSIUM: 5 meq/L (ref 3.7–5.3)
Sodium: 140 mEq/L (ref 137–147)

## 2013-10-05 LAB — CBC
HEMATOCRIT: 24.5 % — AB (ref 39.0–52.0)
Hemoglobin: 7.7 g/dL — ABNORMAL LOW (ref 13.0–17.0)
MCH: 29.8 pg (ref 26.0–34.0)
MCHC: 31.4 g/dL (ref 30.0–36.0)
MCV: 95 fL (ref 78.0–100.0)
Platelets: 210 10*3/uL (ref 150–400)
RBC: 2.58 MIL/uL — ABNORMAL LOW (ref 4.22–5.81)
RDW: 16.2 % — ABNORMAL HIGH (ref 11.5–15.5)
WBC: 17.2 10*3/uL — ABNORMAL HIGH (ref 4.0–10.5)

## 2013-10-05 NOTE — Progress Notes (Signed)
[facility]                                                                                              Progress note     Patient Demographics  Caliph Borowiak, is a 73 y.o. male  VFI:[id number]  [id number]  DOB - 01-08-1941  Admit date - 09/04/2013  Admitting Physician Merton Border, MD  Outpatient Primary MD for the patient is Pcp Not In System  LOS - 7   Chief complaint     Respiratory failure     Peritonitis       Subjective:   Unable to obtain due to patient's condition and mechanical ventilation  Objective:   Vital signs  Temperature 98.386 Heart rate 84 Respiratory rate 29 Blood pressure 124/71 Pulse ox 97%    Exam Awake and alert, No new F.N deficits,  [place].AT Supple Neck,No JVD, No cervical lymphadenopathy appriciated.  Tracheostomy noted at midline Symmetrical Chest wall movement, decreased breath sounds bilaterally with scattered rhonchi RRR,No Gallops,Rubs or new Murmurs, No Parasternal Heave +ve B.Sounds, Abd Soft, Non tender, No organomegaly appriciated, No rebound - guarding or rigidity. PEG tube and colostomy noted No Cyanosis, Clubbing or edema,  Multiple wounds noted  I&Os 2838/1675 Foley - no Trach #8 Shiley   Data Review   CBC  Recent Labs Lab 09/29/13 0500 10/01/13 0500 10/03/13 1139 10/05/13 0500  WBC 10.0 12.1*  --  17.2*  HGB 7.3* 6.3* 8.9* 7.7*  HCT 23.0* 20.0* 27.6* 24.5*  PLT 199 194  --  210  MCV 98.3 97.1  --  95.0  MCH 31.2 30.6  --  29.8  MCHC 31.7 31.5  --  31.4  RDW 16.6* 16.1*  --  16.2*    Chemistries   Recent Labs Lab 09/30/13 0500 10/01/13 0500 10/02/13 0500 10/03/13 0505 10/05/13 0500  NA 146 150* 147 139 140  K 4.7 4.7 4.8 4.6 5.0  CL 105 107 104 98 98  CO2 33* 35* 34* 33* 33*  GLUCOSE 131* 124* 109* 132* 119*  BUN 49* 50* 49* 47* 45*  CREATININE 1.01 1.03 1.07 1.11 1.14  CALCIUM 8.4 8.2* 8.5 8.4 8.5     Micro Results     Assessment & Plan   VDRF; ARDS continue on full support FiO2 50% PEEP of 5. 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 We'll continue with treatment klonipin PRN for agitation  Discussed with pulmonary critical care and ID Code Status: Limited DVT Prophylaxis  SCDs   Merton Border M.D on 10/05/2013 at 1:33 PM

## 2013-10-06 LAB — BASIC METABOLIC PANEL
BUN: 44 mg/dL — ABNORMAL HIGH (ref 6–23)
CO2: 33 meq/L — AB (ref 19–32)
CREATININE: 1.19 mg/dL (ref 0.50–1.35)
Calcium: 8.5 mg/dL (ref 8.4–10.5)
Chloride: 96 mEq/L (ref 96–112)
GFR calc Af Amer: 70 mL/min — ABNORMAL LOW (ref >90)
GFR calc non Af Amer: 60 mL/min — ABNORMAL LOW (ref >90)
Glucose, Bld: 131 mg/dL — ABNORMAL HIGH (ref 70–99)
Potassium: 5.1 mEq/L (ref 3.7–5.3)
Sodium: 139 mEq/L (ref 137–147)

## 2013-10-06 NOTE — Progress Notes (Signed)
[facility]                                                                                              Progress note     Patient Demographics  Jason Harmon, is a 73 y.o. male  [id number]  [id number]  DOB - 12/06/1940  Admit date - 09/04/2013  Admitting Physician Merton Border, MD  Outpatient Primary MD for the patient is Pcp Not In System  LOS - 32   Chief complaint     Respiratory failure     Peritonitis       Subjective:   Unable to obtain due to patient's condition and mechanical ventilation  Objective:   Vital signs  Temperature 98.3 Heart rate 78 Respiratory rate 29 Blood pressure 148/78 Pulse ox 99%    Exam Awake and alert, No new F.N deficits,  [place].AT Supple Neck,No JVD, No cervical lymphadenopathy appriciated.  Tracheostomy noted at midline Symmetrical Chest wall movement, decreased breath sounds bilaterally with scattered rhonchi RRR,No Gallops,Rubs or new Murmurs, No Parasternal Heave +ve B.Sounds, Abd Soft, Non tender, No organomegaly appriciated, No rebound - guarding or rigidity. PEG tube and colostomy noted No Cyanosis, Clubbing or edema,  Multiple wounds noted  I&Os 2890/1625 Foley - no Trach #6 XL changed on 3/16   Data Review   CBC  Recent Labs Lab 10/01/13 0500 10/03/13 1139 10/05/13 0500  WBC 12.1*  --  17.2*  HGB 6.3* 8.9* 7.7*  HCT 20.0* 27.6* 24.5*  PLT 194  --  210  MCV 97.1  --  95.0  MCH 30.6  --  29.8  MCHC 31.5  --  31.4  RDW 16.1*  --  16.2*    Chemistries   Recent Labs Lab 10/01/13 0500 10/02/13 0500 10/03/13 0505 10/05/13 0500 10/06/13 0500  NA 150* 147 139 140 139  K 4.7 4.8 4.6 5.0 5.1  CL 107 104 98 98 96  CO2 35* 34* 33* 33* 33*  GLUCOSE 124* 109* 132* 119* 131*  BUN 50* 49* 47* 45* 44*  CREATININE 1.03 1.07 1.11 1.14 1.19  CALCIUM 8.2* 8.5 8.4 8.5 8.5    Micro Results     Assessment & Plan    VDRF; ARDS continue on full support FiO2 50% PEEP of 5. 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 We'll continue with treatment Check CBC and portable chest x-ray in a.m.  Discussed with pulmonary critical care and ID Code Status: Limited DVT Prophylaxis  SCDs   Merton Border M.D on 10/06/2013 at 1:09 PM

## 2013-10-07 ENCOUNTER — Other Ambulatory Visit (HOSPITAL_COMMUNITY): Payer: Self-pay

## 2013-10-07 DIAGNOSIS — J9600 Acute respiratory failure, unspecified whether with hypoxia or hypercapnia: Secondary | ICD-10-CM | POA: Diagnosis not present

## 2013-10-07 LAB — BASIC METABOLIC PANEL
BUN: 44 mg/dL — ABNORMAL HIGH (ref 6–23)
CALCIUM: 8.2 mg/dL — AB (ref 8.4–10.5)
CO2: 34 meq/L — AB (ref 19–32)
Chloride: 98 mEq/L (ref 96–112)
Creatinine, Ser: 1.19 mg/dL (ref 0.50–1.35)
GFR calc Af Amer: 70 mL/min — ABNORMAL LOW (ref >90)
GFR calc non Af Amer: 60 mL/min — ABNORMAL LOW (ref >90)
GLUCOSE: 135 mg/dL — AB (ref 70–99)
Potassium: 5 mEq/L (ref 3.7–5.3)
SODIUM: 140 meq/L (ref 137–147)

## 2013-10-07 LAB — CBC
HEMATOCRIT: 23.9 % — AB (ref 39.0–52.0)
Hemoglobin: 7.8 g/dL — ABNORMAL LOW (ref 13.0–17.0)
MCH: 31 pg (ref 26.0–34.0)
MCHC: 32.6 g/dL (ref 30.0–36.0)
MCV: 94.8 fL (ref 78.0–100.0)
PLATELETS: 247 10*3/uL (ref 150–400)
RBC: 2.52 MIL/uL — AB (ref 4.22–5.81)
RDW: 15.9 % — ABNORMAL HIGH (ref 11.5–15.5)
WBC: 15.9 10*3/uL — ABNORMAL HIGH (ref 4.0–10.5)

## 2013-10-07 NOTE — Progress Notes (Signed)
PULMONARY  / CRITICAL CARE MEDICINE  Name: Jason Harmon MRN: [id number] DOB: [birth year]  CONSULTATION DATE: 09/05/2013  BRIEF PATIENT DESCRIPTION: 73 y/o who initially presented to an outside facility on 1/9 with SBO / ischemic bowel requiring laparotomy and small bowel resection. His course was complicated by ARF, now on dialysis and respiratory failure resulting in tracheostomy and prolonged mechanical ventilation.  Has required multiple surgeries for ischemic bowel with perforation.    CULTURES: BCx2 2/23>>> PICC Tip 2/23>>>neg Tracheal Aspirate 2/23>>>neg UC 2/23>>>neg  ANTIBIOTICS: Zosyn 2/1 >> 2/12  Vancomycin 2/9 >> 2/10  Linezolid 2/10 >> 2/12  Bactrim 2/12 >> stop date 2/27  .....................................................Marland Kitchen Vanco 2/23>>> Meropenem 2/23>>> .....................................................Marland Kitchen ABX per [facility] MD  KEY EVENTS  2/19 - FOB with evidence of tracheal erosion & bleeding.   2/20 - no further hemoptysis  2/23 - Full vent dependent. They still have some blood secretions occ. Through trach. Not following commands.  2/24 - PCT 0.24, lactic acid 1.2 2/25 - on PCV, not weaning 2/27 - improved post R & L thora (1L & 1.3L), more alert 3/11 PCCM called for hemoptysis 3/12 Hemoptysis improved 3/16 copious hemoptysis plan fob   STUDIES:  2/23 - CT ABD/Pelvis>>s/p colostomy without acute findings, small volume ascites, decompressed bowel, mild mesenteric & soft tissue edema, likely reflective of anasarca 2/23 - CT Chest>>>mod B effusions, consolidation, no ptx 3/16 fob>>bleeding from where the suction catheter contacts the airway from angle of shiley tracheostomy.  INTERVAL HISTORY: On full support, Hemoptysis resolved.  PHYSICAL EXAMINATION: Vital signs: Reviewed, abnormal findings will be addressed in assessment / plan.   Desaturation  During hemoptysis episodes General: chronically ill appearing  Neuro: alert, awake, generalized  weakness HEENT:  PERRL, #8 trach c/d/i, copious hemoptysis  Cardiovascular:  RRR Lungs:  Increased wob, coarse  bilaterally.  +++ hemoptysis Abdomen:  Dressing intact, large, soft Musculoskeletal:  Moves all extremities, no edema Skin:  Intact except for abd.  LABS: PULMONARY No results found for this basename: PHART, PCO2, PCO2ART, PO2, PO2ART, HCO3, TCO2, O2SAT,  in the last 168 hours CBC  Recent Labs Lab 10/01/13 0500 10/03/13 1139 10/05/13 0500 10/07/13 0500  HGB 6.3* 8.9* 7.7* 7.8*  HCT 20.0* 27.6* 24.5* 23.9*  WBC 12.1*  --  17.2* 15.9*  PLT 194  --  210 247   CARDIAC   No results found for this basename: TROPONINI,  in the last 168 hours No results found for this basename: PROBNP,  in the last 168 hours CHEMISTRY  Recent Labs Lab 10/02/13 0500 10/03/13 0505 10/05/13 0500 10/06/13 0500 10/07/13 0500  NA 147 139 140 139 140  K 4.8 4.6 5.0 5.1 5.0  CL 104 98 98 96 98  CO2 34* 33* 33* 33* 34*  GLUCOSE 109* 132* 119* 131* 135*  BUN 49* 47* 45* 44* 44*  CREATININE 1.07 1.11 1.14 1.19 1.19  CALCIUM 8.5 8.4 8.5 8.5 8.2*   The CrCl is unknown because both a height and weight (above a minimum accepted value) are required for this calculation.  LIVER No results found for this basename: AST, ALT, ALKPHOS, BILITOT, PROT, ALBUMIN, INR,  in the last 168 hours INFECTIOUS No results found for this basename: LATICACIDVEN, PROCALCITON,  in the last 168 hours IMAGING x48h  Dg Chest Port 1 View  10/07/2013   CLINICAL DATA:  Respiratory failure.  EXAM: PORTABLE CHEST - 1 VIEW  COMPARISON:  10/05/2013  FINDINGS: Right IJ central line tip overlies the level of the superior vena cava.  Tracheostomy tube is in place, unchanged in position.  The heart size is difficult to assess given pulmonary densities. Significant pulmonary opacities are noted, similar in appearance to prior study and partially obscuring the hemidiaphragms bilaterally. Probable bilateral pleural effusions are  present. Findings are consistent with pulmonary edema and/or infection.  IMPRESSION: Little interval change in significant bilateral pulmonary opacities.   Electronically Signed   By: Shon Hale M.D.   On: 10/07/2013 08:16    ASSESSMENT  Acute on chronic respiratory failure - multifactorial in setting of HCAP, abdominal surgeries, small pleural effusions, pain, atelectasis, anemia and deconditioning Pneumonia - positive for Stenotrophomonas Maltophilia  ARDS +/- cardiogenic pulmonary edema with persistent B infiltrates (CXR 3/11) Atrial fibrillation Tracheostomy status   Hemoptysis - likely due to tracheal erosion vs suction trauma.  Recurrent on 3/11. ? Due to suctioning + elevated INR 1.9 Recurrent on 3/16 inr 1.06 and no anticoagulation. Renal Failure  Anemia - in setting of critical illness, phlebotomy and trach site bleeding S/P Colectomy Worsening ARDS PLAN: - After measures over the weekend, able to get down to 40% and PEEP of 5, actually tolerated PS of 12/5 for 3.5 hours today. - Continue weaning per protocol. - Continue diureses. - Continue antibiotics noted. - Cultures noted. - PRN fentanyl for pain - Mobilize as able, upright positioning, PT efforts - Ongoing discussions with family regarding goals of care as patient's functional status has been very poor and he is deteriorating. - Begin arranging for vent SNF if family continues to wish for full support, complete liberation from the vent remains a far fetched target in this case.  Rush Farmer, M.D. [facility] Pulmonary/Critical Care Medicine. Pager: [phone]. After hours pager: [phone].

## 2013-10-08 LAB — BASIC METABOLIC PANEL
BUN: 42 mg/dL — AB (ref 6–23)
CO2: 35 meq/L — AB (ref 19–32)
CREATININE: 1.18 mg/dL (ref 0.50–1.35)
Calcium: 8.1 mg/dL — ABNORMAL LOW (ref 8.4–10.5)
Chloride: 97 mEq/L (ref 96–112)
GFR calc Af Amer: 70 mL/min — ABNORMAL LOW (ref >90)
GFR calc non Af Amer: 61 mL/min — ABNORMAL LOW (ref >90)
Glucose, Bld: 125 mg/dL — ABNORMAL HIGH (ref 70–99)
Potassium: 5 mEq/L (ref 3.7–5.3)
Sodium: 141 mEq/L (ref 137–147)

## 2013-10-09 DIAGNOSIS — D62 Acute posthemorrhagic anemia: Secondary | ICD-10-CM

## 2013-10-09 DIAGNOSIS — N179 Acute kidney failure, unspecified: Secondary | ICD-10-CM

## 2013-10-09 LAB — BASIC METABOLIC PANEL
BUN: 42 mg/dL — ABNORMAL HIGH (ref 6–23)
CALCIUM: 8.1 mg/dL — AB (ref 8.4–10.5)
CO2: 32 mEq/L (ref 19–32)
CREATININE: 1.15 mg/dL (ref 0.50–1.35)
Chloride: 97 mEq/L (ref 96–112)
GFR calc Af Amer: 73 mL/min — ABNORMAL LOW (ref >90)
GFR, EST NON AFRICAN AMERICAN: 63 mL/min — AB (ref >90)
Glucose, Bld: 123 mg/dL — ABNORMAL HIGH (ref 70–99)
Potassium: 5.1 mEq/L (ref 3.7–5.3)
Sodium: 140 mEq/L (ref 137–147)

## 2013-10-09 NOTE — Progress Notes (Signed)
PULMONARY  / CRITICAL CARE MEDICINE  Name: Zaden Sako MRN: [id number] DOB: Sep 28, 1940  CONSULTATION DATE: 09/05/2013  BRIEF PATIENT DESCRIPTION: 73 y/o who initially presented to an outside facility on 1/9 with SBO / ischemic bowel requiring laparotomy and small bowel resection. His course was complicated by ARF, now on dialysis and respiratory failure resulting in tracheostomy and prolonged mechanical ventilation.  Has required multiple surgeries for ischemic bowel with perforation.    CULTURES: BCx2 2/23>>> PICC Tip 2/23>>>neg Tracheal Aspirate 2/23>>>neg UC 2/23>>>neg  ANTIBIOTICS: Zosyn 2/1 >> 2/12  Vancomycin 2/9 >> 2/10  Linezolid 2/10 >> 2/12  Bactrim 2/12 >> stop date 2/27  .....................................................Marland Kitchen Vanco 2/23>>> Meropenem 2/23>>> .....................................................Marland Kitchen ABX per [facility] MD  KEY EVENTS  2/19 - FOB with evidence of tracheal erosion & bleeding.   2/20 - no further hemoptysis  2/23 - Full vent dependent. They still have some blood secretions occ. Through trach. Not following commands.  2/24 - PCT 0.24, lactic acid 1.2 2/25 - on PCV, not weaning 2/27 - improved post R & L thora (1L & 1.3L), more alert 3/11 PCCM called for hemoptysis 3/12 Hemoptysis improved 3/16 copious hemoptysis plan fob   STUDIES:  2/23 - CT ABD/Pelvis>>s/p colostomy without acute findings, small volume ascites, decompressed bowel, mild mesenteric & soft tissue edema, likely reflective of anasarca 2/23 - CT Chest>>>mod B effusions, consolidation, no ptx 3/16 fob>>bleeding from where the suction catheter contacts the airway from angle of shiley tracheostomy.  INTERVAL HISTORY: On full support, Hemoptysis resolved.  PHYSICAL EXAMINATION: Vital signs: Reviewed, abnormal findings will be addressed in assessment / plan.     General: chronically ill appearing  Neuro: alert, awake, generalized weakness HEENT:  PERRL, #8 trach  c/d/i Cardiovascular:  RRR Lungs:  Scattered rhonchi  Abdomen:  Dressing intact, large, soft Musculoskeletal:  Moves all extremities, no edema Skin:  Intact except for abd.  LABS: PULMONARY No results found for this basename: PHART, PCO2, PCO2ART, PO2, PO2ART, HCO3, TCO2, O2SAT,  in the last 168 hours CBC  Recent Labs Lab 10/03/13 1139 10/05/13 0500 10/07/13 0500  HGB 8.9* 7.7* 7.8*  HCT 27.6* 24.5* 23.9*  WBC  --  17.2* 15.9*  PLT  --  210 247   CARDIAC   No results found for this basename: TROPONINI,  in the last 168 hours No results found for this basename: PROBNP,  in the last 168 hours CHEMISTRY  Recent Labs Lab 10/05/13 0500 10/06/13 0500 10/07/13 0500 10/08/13 0500 10/09/13 0455  NA 140 139 140 141 140  K 5.0 5.1 5.0 5.0 5.1  CL 98 96 98 97 97  CO2 33* 33* 34* 35* 32  GLUCOSE 119* 131* 135* 125* 123*  BUN 45* 44* 44* 42* 42*  CREATININE 1.14 1.19 1.19 1.18 1.15  CALCIUM 8.5 8.5 8.2* 8.1* 8.1*   The CrCl is unknown because both a height and weight (above a minimum accepted value) are required for this calculation.  LIVER No results found for this basename: AST, ALT, ALKPHOS, BILITOT, PROT, ALBUMIN, INR,  in the last 168 hours INFECTIOUS No results found for this basename: LATICACIDVEN, PROCALCITON,  in the last 168 hours IMAGING x48h  No results found.  ASSESSMENT  Acute on chronic respiratory failure/Tracheostomy status   - multifactorial in setting of HCAP, abdominal surgeries, small pleural effusions, pain, atelectasis, anemia and Profound deconditioning Pneumonia - positive for Stenotrophomonas Maltophilia. S/p bactrim  ARDS +/- cardiogenic pulmonary edema with persistent B infiltrates (CXR 3/11) Hemoptysis - recurrent, likely due to  tracheal erosion vs suction trauma.  Recurrent on 3/11. ? Due to suctioning + elevated INR 1.9 >think his deconditioning is his primary obstacle at this point  Plan:  - Continue weaning per protocol., barely does PS  12, goal 4 hr - Continue diureses, added lasix , was no longer on MAR - PRN fentanyl for pain - Mobilize as able, upright positioning, PT efforts - will need vent/snf -upright as able -d/w wife, lack of progress  Acute encephalopathy  Plan Supportive care   Atrial fibrillation/ HTN PLAN Cont rate control (dig and lopressor) No blood thinners given bleeding issues   Anemia - in setting of critical illness, phlebotomy and trach site bleeding Plan Cont supportive care Minimize labs as able  S/P Colectomy PLAN: Cont supportive care   [name]. Titus Mould, MD, [place] Pgr: [place] Pulmonary & Critical Care

## 2013-10-10 LAB — BASIC METABOLIC PANEL
BUN: 43 mg/dL — ABNORMAL HIGH (ref 6–23)
CHLORIDE: 96 meq/L (ref 96–112)
CO2: 35 meq/L — AB (ref 19–32)
Calcium: 8.4 mg/dL (ref 8.4–10.5)
Creatinine, Ser: 1.24 mg/dL (ref 0.50–1.35)
GFR calc Af Amer: 66 mL/min — ABNORMAL LOW (ref >90)
GFR calc non Af Amer: 57 mL/min — ABNORMAL LOW (ref >90)
GLUCOSE: 114 mg/dL — AB (ref 70–99)
POTASSIUM: 4.9 meq/L (ref 3.7–5.3)
Sodium: 141 mEq/L (ref 137–147)

## 2013-10-10 LAB — MAGNESIUM: Magnesium: 1.7 mg/dL (ref 1.5–2.5)

## 2013-10-10 LAB — PHOSPHORUS: Phosphorus: 3.8 mg/dL (ref 2.3–4.6)

## 2013-10-11 LAB — BASIC METABOLIC PANEL
BUN: 43 mg/dL — ABNORMAL HIGH (ref 6–23)
CO2: 36 mEq/L — ABNORMAL HIGH (ref 19–32)
Calcium: 8.1 mg/dL — ABNORMAL LOW (ref 8.4–10.5)
Chloride: 94 mEq/L — ABNORMAL LOW (ref 96–112)
Creatinine, Ser: 1.27 mg/dL (ref 0.50–1.35)
GFR calc Af Amer: 64 mL/min — ABNORMAL LOW (ref >90)
GFR, EST NON AFRICAN AMERICAN: 56 mL/min — AB (ref >90)
Glucose, Bld: 125 mg/dL — ABNORMAL HIGH (ref 70–99)
POTASSIUM: 4.8 meq/L (ref 3.7–5.3)
SODIUM: 140 meq/L (ref 137–147)

## 2013-10-11 LAB — MAGNESIUM: Magnesium: 1.6 mg/dL (ref 1.5–2.5)

## 2013-10-12 LAB — BASIC METABOLIC PANEL
BUN: 45 mg/dL — ABNORMAL HIGH (ref 6–23)
CHLORIDE: 94 meq/L — AB (ref 96–112)
CO2: 38 mEq/L — ABNORMAL HIGH (ref 19–32)
Calcium: 8 mg/dL — ABNORMAL LOW (ref 8.4–10.5)
Creatinine, Ser: 1.3 mg/dL (ref 0.50–1.35)
GFR, EST AFRICAN AMERICAN: 63 mL/min — AB (ref >90)
GFR, EST NON AFRICAN AMERICAN: 54 mL/min — AB (ref >90)
GLUCOSE: 116 mg/dL — AB (ref 70–99)
POTASSIUM: 4.8 meq/L (ref 3.7–5.3)
SODIUM: 142 meq/L (ref 137–147)

## 2013-10-12 LAB — MAGNESIUM: MAGNESIUM: 1.7 mg/dL (ref 1.5–2.5)

## 2013-10-12 LAB — DIGOXIN LEVEL: Digoxin Level: 2.1 ng/mL — ABNORMAL HIGH (ref 0.8–2.0)

## 2013-10-13 LAB — BASIC METABOLIC PANEL
BUN: 45 mg/dL — ABNORMAL HIGH (ref 6–23)
CO2: 37 meq/L — AB (ref 19–32)
Calcium: 8.1 mg/dL — ABNORMAL LOW (ref 8.4–10.5)
Chloride: 90 mEq/L — ABNORMAL LOW (ref 96–112)
Creatinine, Ser: 1.32 mg/dL (ref 0.50–1.35)
GFR calc Af Amer: 61 mL/min — ABNORMAL LOW (ref >90)
GFR, EST NON AFRICAN AMERICAN: 53 mL/min — AB (ref >90)
GLUCOSE: 128 mg/dL — AB (ref 70–99)
POTASSIUM: 4.6 meq/L (ref 3.7–5.3)
SODIUM: 136 meq/L — AB (ref 137–147)

## 2013-10-13 LAB — MAGNESIUM: MAGNESIUM: 1.6 mg/dL (ref 1.5–2.5)

## 2013-10-13 LAB — PRO B NATRIURETIC PEPTIDE: PRO B NATRI PEPTIDE: 2259 pg/mL — AB (ref 0–125)

## 2013-10-14 LAB — BASIC METABOLIC PANEL
BUN: 47 mg/dL — ABNORMAL HIGH (ref 6–23)
CO2: 37 meq/L — AB (ref 19–32)
Calcium: 8.3 mg/dL — ABNORMAL LOW (ref 8.4–10.5)
Chloride: 96 mEq/L (ref 96–112)
Creatinine, Ser: 1.29 mg/dL (ref 0.50–1.35)
GFR calc Af Amer: 63 mL/min — ABNORMAL LOW (ref >90)
GFR calc non Af Amer: 55 mL/min — ABNORMAL LOW (ref >90)
Glucose, Bld: 137 mg/dL — ABNORMAL HIGH (ref 70–99)
Potassium: 5 mEq/L (ref 3.7–5.3)
SODIUM: 142 meq/L (ref 137–147)

## 2013-10-14 NOTE — Progress Notes (Signed)
[facility]                                                                                              Progress note     Patient Demographics  Jason Harmon, is a 73 y.o. male  GEZ:[id number]  [id number]:[id number]  DOB - [birth year]  Admit date - 09/04/2013  Admitting Physician Merton Border, MD  Outpatient Primary MD for the patient is Pcp Not In System  LOS - 60   Chief complaint     Respiratory failure     Peritonitis       Subjective:   Unable to obtain due to patient's condition and mechanical ventilation  Objective:   Vital signs  Temperature 98.3 Heart rate 78 Respiratory rate 29 Blood pressure 148/78 Pulse ox 99%    Exam Awake and alert, No new F.N deficits,  [place].AT Supple Neck,No JVD, No cervical lymphadenopathy appriciated.  Tracheostomy noted at midline Symmetrical Chest wall movement, decreased breath sounds bilaterally with scattered rhonchi RRR,No Gallops,Rubs or new Murmurs, No Parasternal Heave +ve B.Sounds, Abd Soft, Non tender, No organomegaly appriciated, No rebound - guarding or rigidity. PEG tube and colostomy noted No Cyanosis, Clubbing or edema,  Multiple wounds noted  I&Os 2890/1625 Foley - no Trach #6 XL changed on 3/16   Data Review   CBC No results found for this basename: WBC, HGB, HCT, PLT, MCV, MCH, MCHC, RDW, NEUTRABS, LYMPHSABS, MONOABS, EOSABS, BASOSABS, BANDABS, BANDSABD,  in the last 168 hours  Chemistries   Recent Labs Lab 10/10/13 0533 10/11/13 0640 10/12/13 0630 10/13/13 0600 10/14/13 0510  NA 141 140 142 136* 142  K 4.9 4.8 4.8 4.6 5.0  CL 96 94* 94* 90* 96  CO2 35* 36* 38* 37* 37*  GLUCOSE 114* 125* 116* 128* 137*  BUN 43* 43* 45* 45* 47*  CREATININE 1.24 1.27 1.30 1.32 1.29  CALCIUM 8.4 8.1* 8.0* 8.1* 8.3*  MG 1.7 1.6 1.7 1.6  --     Micro Results     Assessment & Plan   VDRF; ARDS continue PSV trials Bowel  perforation status post colostomy Moderate bilateral pleural effusion status post thoracentesis 2/25 Renal failure;  resolved Atrial fib heart rate is improving, continue with the digoxin and Lopressor Hemoptysis status post bronchoscopy on 219 that showed trachea erosions 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  Discussed with pulmonary critical care and ID Code Status: Limited DVT Prophylaxis  SCDs   Merton Border M.D on 10/14/2013 at 11:56 AM

## 2013-10-14 NOTE — Progress Notes (Signed)
PULMONARY  / CRITICAL CARE MEDICINE  Name: Jason Harmon MRN: [id number] DOB: 1940/08/30  CONSULTATION DATE: 09/05/2013  BRIEF PATIENT DESCRIPTION: 73 y/o who initially presented to an outside facility on 1/9 with SBO / ischemic bowel requiring laparotomy and small bowel resection. His course was complicated by ARF, now on dialysis and respiratory failure resulting in tracheostomy and prolonged mechanical ventilation.  Has required multiple surgeries for ischemic bowel with perforation.    CULTURES: BCx2 2/23>>> PICC Tip 2/23>>>neg Tracheal Aspirate 2/23>>>neg UC 2/23>>>neg  ANTIBIOTICS: Zosyn 2/1 >> 2/12  Vancomycin 2/9 >> 2/10  Linezolid 2/10 >> 2/12  Bactrim 2/12 >> stop date 2/27  .....................................................Marland Kitchen Vanco 2/23>>> Meropenem 2/23>>> .....................................................Marland Kitchen ABX per [facility] MD  KEY EVENTS  2/19 - FOB with evidence of tracheal erosion & bleeding.   2/20 - no further hemoptysis  2/23 - Full vent dependent. They still have some blood secretions occ. Through trach. Not following commands.  2/24 - PCT 0.24, lactic acid 1.2 2/25 - on PCV, not weaning 2/27 - improved post R & L thora (1L & 1.3L), more alert 3/11 PCCM called for hemoptysis 3/12 Hemoptysis improved 3/16 copious hemoptysis plan fob   STUDIES:  2/23 - CT ABD/Pelvis>>s/p colostomy without acute findings, small volume ascites, decompressed bowel, mild mesenteric & soft tissue edema, likely reflective of anasarca 2/23 - CT Chest>>>mod B effusions, consolidation, no ptx 3/16 fob>>bleeding from where the suction catheter contacts the airway from angle of shiley tracheostomy.  INTERVAL HISTORY: On full support, Hemoptysis resolved.  PHYSICAL EXAMINATION: Vital signs: Reviewed, abnormal findings will be addressed in assessment / plan.    General: chronically ill appearing  Neuro: alert, awake, generalized weakness HEENT:  PERRL, #8 trach  c/d/i Cardiovascular:  RRR Lungs:  Scattered rhonchi  Abdomen:  Dressing intact, large, soft Musculoskeletal:  Moves all extremities, no edema Skin:  Intact except for abd.  LABS: PULMONARY No results found for this basename: PHART, PCO2, PCO2ART, PO2, PO2ART, HCO3, TCO2, O2SAT,  in the last 168 hours CBC No results found for this basename: HGB, HCT, WBC, PLT,  in the last 168 hours CARDIAC   No results found for this basename: TROPONINI,  in the last 168 hours  Recent Labs Lab 10/13/13 0600  PROBNP 2259.0*   CHEMISTRY  Recent Labs Lab 10/10/13 0533 10/11/13 0640 10/12/13 0630 10/13/13 0600 10/14/13 0510  NA 141 140 142 136* 142  K 4.9 4.8 4.8 4.6 5.0  CL 96 94* 94* 90* 96  CO2 35* 36* 38* 37* 37*  GLUCOSE 114* 125* 116* 128* 137*  BUN 43* 43* 45* 45* 47*  CREATININE 1.24 1.27 1.30 1.32 1.29  CALCIUM 8.4 8.1* 8.0* 8.1* 8.3*  MG 1.7 1.6 1.7 1.6  --   PHOS 3.8  --   --   --   --    The CrCl is unknown because both a height and weight (above a minimum accepted value) are required for this calculation.  LIVER No results found for this basename: AST, ALT, ALKPHOS, BILITOT, PROT, ALBUMIN, INR,  in the last 168 hours INFECTIOUS No results found for this basename: LATICACIDVEN, PROCALCITON,  in the last 168 hours  IMAGING x48h  No results found.  ASSESSMENT AND PLAN:  Acute on chronic respiratory failure HCAP with Stenotrophomonas Maltophilia ARDS Pulmonary edema Tracheostomy status Recurrent hemoptysis Acute encephalopathy  Severe deconditioning   Continuous vent support  Wean as tolerated   May need chronic vent facility  Minimize sedation  Mobilize as able  Aim for  negative fluid status  Abx per primary team  I have personally obtained history, examined patient, evaluated and interpreted laboratory and imaging results, reviewed medical records, formulated assessment / plan and placed orders.  Doree Fudge, MD Pulmonary and [place] Pager: [phone]  10/14/2013, 5:33 PM

## 2013-10-15 ENCOUNTER — Other Ambulatory Visit (HOSPITAL_COMMUNITY): Payer: Medicare Other

## 2013-10-15 DIAGNOSIS — J9600 Acute respiratory failure, unspecified whether with hypoxia or hypercapnia: Secondary | ICD-10-CM | POA: Diagnosis not present

## 2013-10-15 LAB — BASIC METABOLIC PANEL
BUN: 48 mg/dL — ABNORMAL HIGH (ref 6–23)
CO2: 40 mEq/L (ref 19–32)
Calcium: 8.7 mg/dL (ref 8.4–10.5)
Chloride: 93 mEq/L — ABNORMAL LOW (ref 96–112)
Creatinine, Ser: 1.35 mg/dL (ref 0.50–1.35)
GFR calc non Af Amer: 52 mL/min — ABNORMAL LOW (ref >90)
GFR, EST AFRICAN AMERICAN: 60 mL/min — AB (ref >90)
Glucose, Bld: 143 mg/dL — ABNORMAL HIGH (ref 70–99)
POTASSIUM: 4.9 meq/L (ref 3.7–5.3)
SODIUM: 140 meq/L (ref 137–147)

## 2013-10-15 LAB — PREPARE RBC (CROSSMATCH)

## 2013-10-15 LAB — CBC
HCT: 20.9 % — ABNORMAL LOW (ref 39.0–52.0)
HEMOGLOBIN: 6.5 g/dL — AB (ref 13.0–17.0)
MCH: 30.4 pg (ref 26.0–34.0)
MCHC: 31.1 g/dL (ref 30.0–36.0)
MCV: 97.7 fL (ref 78.0–100.0)
Platelets: 303 10*3/uL (ref 150–400)
RBC: 2.14 MIL/uL — AB (ref 4.22–5.81)
RDW: 15.8 % — ABNORMAL HIGH (ref 11.5–15.5)
WBC: 20.7 10*3/uL — ABNORMAL HIGH (ref 4.0–10.5)

## 2013-10-15 NOTE — Progress Notes (Addendum)
[facility]                                                                                              Progress note     Patient Demographics  Jason Harmon, is a 73 y.o. male  PFX:[id number]  [id number]  DOB - [birth year]  Admit date - 09/04/2013  Admitting Physician Merton Border, MD  Outpatient Primary MD for the patient is Pcp Not In System  LOS - 41   Chief complaint     Respiratory failure     Peritonitis       Subjective:   Unable to obtain due to patient's condition and mechanical ventilation  Objective:   Vital signs  Temperature 98.5 Heart rate 85 Respiratory rate 27 Blood pressure 138/68 Pulse ox 97%    Exam Awake and alert, No new F.N deficits,  [place].AT Supple Neck,No JVD, No cervical lymphadenopathy appriciated.  Tracheostomy noted at midline Symmetrical Chest wall movement, decreased breath sounds bilaterally with scattered rhonchi RRR,No Gallops,Rubs or new Murmurs, No Parasternal Heave +ve B.Sounds, Abd Soft, Non tender, No organomegaly appriciated, No rebound - guarding or rigidity. PEG tube and colostomy noted No Cyanosis, Clubbing or edema,  Multiple wounds noted  I&Os 2640/4250 Foley - no Trach #6 XL changed on 3/16   Data Review   CBC  Recent Labs Lab 10/15/13 0635  WBC 20.7*  HGB 6.5*  HCT 20.9*  PLT 303  MCV 97.7  MCH 30.4  MCHC 31.1  RDW 15.8*    Chemistries   Recent Labs Lab 10/10/13 0533 10/11/13 0640 10/12/13 0630 10/13/13 0600 10/14/13 0510 10/15/13 0635  NA 141 140 142 136* 142 140  K 4.9 4.8 4.8 4.6 5.0 4.9  CL 96 94* 94* 90* 96 93*  CO2 35* 36* 38* 37* 37* 40*  GLUCOSE 114* 125* 116* 128* 137* 143*  BUN 43* 43* 45* 45* 47* 48*  CREATININE 1.24 1.27 1.30 1.32 1.29 1.35  CALCIUM 8.4 8.1* 8.0* 8.1* 8.3* 8.7  MG 1.7 1.6 1.7 1.6  --   --     Micro Results   Chest x-ray; improved aeration  Assessment & Plan    VDRF; ARDS continue PSV trials Bowel perforation status post colostomy Moderate bilateral pleural effusion status post thoracentesis 2/25 Renal failure;  resolved Atrial fib heart rate is improving, continue with the digoxin and Lopressor Hemoptysis status post bronchoscopy on 219 that showed trachea erosions 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  Discussed with pulmonary critical care and ID Code Status: Limited DVT Prophylaxis  SCDs   Merton Border M.D on 10/15/2013 at 3:11 PM

## 2013-10-16 LAB — TYPE AND SCREEN
ABO/RH(D): A POS
Antibody Screen: NEGATIVE
UNIT DIVISION: 0
Unit division: 0

## 2013-10-16 LAB — CBC
HEMATOCRIT: 27.3 % — AB (ref 39.0–52.0)
Hemoglobin: 8.8 g/dL — ABNORMAL LOW (ref 13.0–17.0)
MCH: 30 pg (ref 26.0–34.0)
MCHC: 32.2 g/dL (ref 30.0–36.0)
MCV: 93.2 fL (ref 78.0–100.0)
PLATELETS: 302 10*3/uL (ref 150–400)
RBC: 2.93 MIL/uL — ABNORMAL LOW (ref 4.22–5.81)
RDW: 16.7 % — AB (ref 11.5–15.5)
WBC: 22 10*3/uL — ABNORMAL HIGH (ref 4.0–10.5)

## 2013-10-16 LAB — BASIC METABOLIC PANEL
BUN: 47 mg/dL — AB (ref 6–23)
CHLORIDE: 93 meq/L — AB (ref 96–112)
CO2: 38 mEq/L — ABNORMAL HIGH (ref 19–32)
CREATININE: 1.29 mg/dL (ref 0.50–1.35)
Calcium: 8.4 mg/dL (ref 8.4–10.5)
GFR calc non Af Amer: 55 mL/min — ABNORMAL LOW (ref >90)
GFR, EST AFRICAN AMERICAN: 63 mL/min — AB (ref >90)
Glucose, Bld: 109 mg/dL — ABNORMAL HIGH (ref 70–99)
Potassium: 4.8 mEq/L (ref 3.7–5.3)
Sodium: 143 mEq/L (ref 137–147)

## 2013-10-16 NOTE — Progress Notes (Signed)
PULMONARY  / CRITICAL CARE MEDICINE  Name: Jason Harmon MRN: [id number] DOB: 1941-06-25  CONSULTATION DATE: 09/05/2013  BRIEF PATIENT DESCRIPTION: 73 yo who initially presented to an outside facility on 1/9 with SBO / ischemic bowel requiring laparotomy and small bowel resection. His course was complicated by ARF, now on dialysis and respiratory failure resulting in tracheostomy and prolonged mechanical ventilation.  Has required multiple surgeries for ischemic bowel with perforation.    INTERVAL HISTORY: No acute events since last seen  PHYSICAL EXAMINATION: Vital signs: Reviewed General: chronically ill appearing  Neuro: alert, awake, generalized weakness HEENT:  PERRL, #8 trach c/d/i Cardiovascular:  RRR Lungs:  Scattered rhonchi  Abdomen:  Dressing intact, large, soft Musculoskeletal:  Moves all extremities, no edema Skin:  Intact except for abd.  LABS:  Recent Labs Lab 10/15/13 0635 10/16/13 0500  HGB 6.5* 8.8*  HCT 20.9* 27.3*  WBC 20.7* 22.0*  PLT 303 302    Recent Labs Lab 10/10/13 0533 10/11/13 0640 10/12/13 0630 10/13/13 0600 10/14/13 0510 10/15/13 0635 10/16/13 0500  NA 141 140 142 136* 142 140 143  K 4.9 4.8 4.8 4.6 5.0 4.9 4.8  CL 96 94* 94* 90* 96 93* 93*  CO2 35* 36* 38* 37* 37* 40* 38*  GLUCOSE 114* 125* 116* 128* 137* 143* 109*  BUN 43* 43* 45* 45* 47* 48* 47*  CREATININE 1.24 1.27 1.30 1.32 1.29 1.35 1.29  CALCIUM 8.4 8.1* 8.0* 8.1* 8.3* 8.7 8.4  MG 1.7 1.6 1.7 1.6  --   --   --   PHOS 3.8  --   --   --   --   --   --    IMAGING:  Dg Chest Port 1 View  10/15/2013   CLINICAL DATA:  Respiratory failure  EXAM: PORTABLE CHEST - 1 VIEW  COMPARISON:  DG CHEST 1V PORT dated 10/07/2013  FINDINGS: Tracheostomy tube and central venous catheter are stable. Diffuse airspace disease has improved. Bilateral pleural effusions and low lung volumes persist. Upper normal heart size.  IMPRESSION: Improved bilateral airspace disease.   Electronically Signed    By: Maryclare Bean M.D.   On: 10/15/2013 07:59    ASSESSMENT: Acute on chronic respiratory failure HCAP with Stenotrophomonas Maltophilia ARDS Pulmonary edema Tracheostomy status Recurrent hemoptysis Acute encephalopathy  Severe deconditioning  PLAN: Continuous mechanical vent support Wean per protocol  Likely needs chronic vent facility Minimie sedation Mobilize as able Aim for negative fluid status  BABCOCK,PETE  10/16/2013, 8:49 AM  I have personally obtained history, examined patient, evaluated and interpreted laboratory and imaging results, reviewed medical records, formulated assessment / plan and placed orders.  Doree Fudge, MD Pulmonary and [place] Pager: [phone]  10/16/2013, 2:14 PM

## 2013-10-16 NOTE — Progress Notes (Signed)
[facility]                                                                                              Progress note     Patient Demographics  Jason Harmon, is a 73 y.o. male  [id number]:[id number]  [id number]  DOB - [birth year]  Admit date - 09/04/2013  Admitting Physician Merton Border, MD  Outpatient Primary MD for the patient is Pcp Not In System  LOS - 21   Chief complaint     Respiratory failure     Peritonitis       Subjective:   Unable to obtain due to patient's condition and mechanical ventilation  Objective:   Vital signs  Temperature 99.3 Heart rate 84 Respiratory rate 21 Blood pressure 124/63 Pulse ox 94%    Exam Awake and alert, confused, chronically ill No new F.N deficits,  [place].AT Supple Neck,No JVD, No cervical lymphadenopathy appriciated.  Tracheostomy noted at midline Symmetrical Chest wall movement, decreased breath sounds bilaterally with scattered rhonchi RRR,No Gallops,Rubs or new Murmurs, No Parasternal Heave +ve B.Sounds, Abd Soft, Non tender, No organomegaly appriciated, No rebound - guarding or rigidity. PEG tube and colostomy noted No Cyanosis, Clubbing or edema,  Multiple wounds noted  I&Os 3508/3060 Foley - no Trach #6 XL changed on 3/16   Data Review   CBC  Recent Labs Lab 10/15/13 0635 10/16/13 0500  WBC 20.7* 22.0*  HGB 6.5* 8.8*  HCT 20.9* 27.3*  PLT 303 302  MCV 97.7 93.2  MCH 30.4 30.0  MCHC 31.1 32.2  RDW 15.8* 16.7*    Chemistries   Recent Labs Lab 10/10/13 0533 10/11/13 0640 10/12/13 0630 10/13/13 0600 10/14/13 0510 10/15/13 0635 10/16/13 0500  NA 141 140 142 136* 142 140 143  K 4.9 4.8 4.8 4.6 5.0 4.9 4.8  CL 96 94* 94* 90* 96 93* 93*  CO2 35* 36* 38* 37* 37* 40* 38*  GLUCOSE 114* 125* 116* 128* 137* 143* 109*  BUN 43* 43* 45* 45* 47* 48* 47*  CREATININE 1.24 1.27 1.30 1.32 1.29 1.35 1.29  CALCIUM 8.4 8.1*  8.0* 8.1* 8.3* 8.7 8.4  MG 1.7 1.6 1.7 1.6  --   --   --     Micro Results   Chest x-ray; improved aeration  Assessment & Plan   VDRF; ARDS continue PSV trials still not tolerating ATC Bowel perforation status post colostomy Moderate bilateral pleural effusion status post thoracentesis 2/25 Renal failure;  resolved Atrial fib heart rate is improving, continue with the digoxin and Lopressor Hemoptysis status post bronchoscopy on 219 that showed trachea erosions 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   Plan  Check urinalysis  Flagyl per tube  Check CBC in a.m.  Discussed with pulmonary critical care and ID Code Status: Limited DVT Prophylaxis  SCDs   Merton Border M.D on 10/16/2013 at 2:11 PM

## 2013-10-17 LAB — CBC
HCT: 27.4 % — ABNORMAL LOW (ref 39.0–52.0)
Hemoglobin: 8.6 g/dL — ABNORMAL LOW (ref 13.0–17.0)
MCH: 30 pg (ref 26.0–34.0)
MCHC: 31.4 g/dL (ref 30.0–36.0)
MCV: 95.5 fL (ref 78.0–100.0)
Platelets: 290 10*3/uL (ref 150–400)
RBC: 2.87 MIL/uL — ABNORMAL LOW (ref 4.22–5.81)
RDW: 16 % — AB (ref 11.5–15.5)
WBC: 20.5 10*3/uL — ABNORMAL HIGH (ref 4.0–10.5)

## 2013-10-17 LAB — BASIC METABOLIC PANEL
BUN: 51 mg/dL — ABNORMAL HIGH (ref 6–23)
CO2: 38 mEq/L — ABNORMAL HIGH (ref 19–32)
CREATININE: 1.31 mg/dL (ref 0.50–1.35)
Calcium: 8.4 mg/dL (ref 8.4–10.5)
Chloride: 91 mEq/L — ABNORMAL LOW (ref 96–112)
GFR, EST AFRICAN AMERICAN: 62 mL/min — AB (ref >90)
GFR, EST NON AFRICAN AMERICAN: 54 mL/min — AB (ref >90)
Glucose, Bld: 156 mg/dL — ABNORMAL HIGH (ref 70–99)
POTASSIUM: 4.7 meq/L (ref 3.7–5.3)
Sodium: 140 mEq/L (ref 137–147)

## 2013-10-17 LAB — URINALYSIS, ROUTINE W REFLEX MICROSCOPIC
Bilirubin Urine: NEGATIVE
Glucose, UA: NEGATIVE mg/dL
Ketones, ur: NEGATIVE mg/dL
NITRITE: POSITIVE — AB
PROTEIN: 30 mg/dL — AB
Specific Gravity, Urine: 1.015 (ref 1.005–1.030)
UROBILINOGEN UA: 0.2 mg/dL (ref 0.0–1.0)
pH: 6 (ref 5.0–8.0)

## 2013-10-17 LAB — URINE MICROSCOPIC-ADD ON

## 2013-10-17 NOTE — Progress Notes (Signed)
[facility]                                                                                              Progress note     Patient Demographics  Jason Harmon, is a 73 y.o. male  [id number]:[id number]  [id number]  DOB - 05/03/1941  Admit date - 09/04/2013  Admitting Physician Merton Border, MD  Outpatient Primary MD for the patient is Pcp Not In System  LOS - 43   Chief complaint     Respiratory failure     Peritonitis       Subjective:   Unable to obtain due to patient's condition and mechanical ventilation  Objective:   Vital signs  Temperature 98.1 Heart rate 85 Respiratory rate 22 Blood pressure 126/64 Pulse ox 100%    Exam Awake and alert, confused, chronically ill No new F.N deficits,  [place].AT Supple Neck,No JVD, No cervical lymphadenopathy appriciated.  Tracheostomy noted at midline Symmetrical Chest wall movement, decreased breath sounds bilaterally with scattered rhonchi RRR,No Gallops,Rubs or new Murmurs, No Parasternal Heave +ve B.Sounds, Abd Soft, Non tender, No organomegaly appriciated, No rebound - guarding or rigidity. PEG tube and colostomy noted No Cyanosis, Clubbing or edema,  Multiple wounds noted  I&Os 3040/2626 Foley - no Trach #6 XL changed on 3/16   Data Review   CBC  Recent Labs Lab 10/15/13 0635 10/16/13 0500 10/17/13 0500  WBC 20.7* 22.0* 20.5*  HGB 6.5* 8.8* 8.6*  HCT 20.9* 27.3* 27.4*  PLT 303 302 290  MCV 97.7 93.2 95.5  MCH 30.4 30.0 30.0  MCHC 31.1 32.2 31.4  RDW 15.8* 16.7* 16.0*    Chemistries   Recent Labs Lab 10/11/13 0640 10/12/13 0630 10/13/13 0600 10/14/13 0510 10/15/13 0635 10/16/13 0500 10/17/13 0500  NA 140 142 136* 142 140 143 140  K 4.8 4.8 4.6 5.0 4.9 4.8 4.7  CL 94* 94* 90* 96 93* 93* 91*  CO2 36* 38* 37* 37* 40* 38* 38*  GLUCOSE 125* 116* 128* 137* 143* 109* 156*  BUN 43* 45* 45* 47* 48* 47* 51*   CREATININE 1.27 1.30 1.32 1.29 1.35 1.29 1.31  CALCIUM 8.1* 8.0* 8.1* 8.3* 8.7 8.4 8.4  MG 1.6 1.7 1.6  --   --   --   --     Micro Results   Chest x-ray; improved aeration  Assessment & Plan   VDRF; ARDS continue PSV trials  Bowel perforation status post colostomy Moderate bilateral pleural effusion status post thoracentesis 2/25 Renal failure;  resolved Atrial fib heart rate is improving, continue with the digoxin and Lopressor Hemoptysis status post bronchoscopy on 219 that showed trachea erosions 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   Plan  Check urinalysis  Continue Flagyl per tube  Discussed with pulmonary critical care and ID Code Status: Limited DVT Prophylaxis  SCDs   Merton Border M.D on 10/17/2013 at 2:49 PM

## 2013-10-18 LAB — BASIC METABOLIC PANEL
BUN: 59 mg/dL — ABNORMAL HIGH (ref 6–23)
CALCIUM: 8.9 mg/dL (ref 8.4–10.5)
CO2: 38 mEq/L — ABNORMAL HIGH (ref 19–32)
Chloride: 93 mEq/L — ABNORMAL LOW (ref 96–112)
Creatinine, Ser: 1.31 mg/dL (ref 0.50–1.35)
GFR calc Af Amer: 62 mL/min — ABNORMAL LOW (ref >90)
GFR, EST NON AFRICAN AMERICAN: 54 mL/min — AB (ref >90)
Glucose, Bld: 146 mg/dL — ABNORMAL HIGH (ref 70–99)
Potassium: 5.5 mEq/L — ABNORMAL HIGH (ref 3.7–5.3)
SODIUM: 141 meq/L (ref 137–147)

## 2013-10-18 LAB — URINALYSIS, ROUTINE W REFLEX MICROSCOPIC
BILIRUBIN URINE: NEGATIVE
Glucose, UA: NEGATIVE mg/dL
KETONES UR: NEGATIVE mg/dL
Nitrite: POSITIVE — AB
PH: 7 (ref 5.0–8.0)
Protein, ur: NEGATIVE mg/dL
Specific Gravity, Urine: 1.012 (ref 1.005–1.030)
UROBILINOGEN UA: 0.2 mg/dL (ref 0.0–1.0)

## 2013-10-18 LAB — URINE MICROSCOPIC-ADD ON

## 2013-10-18 NOTE — Progress Notes (Signed)
[facility]                                                                                              Progress note     Patient Demographics  Jason Harmon, is a 73 y.o. male  MPN:[id number]  [id number]  DOB - Oct 09, 1940  Admit date - 09/04/2013  Admitting Physician Merton Border, MD  Outpatient Primary MD for the patient is Pcp Not In System  LOS - 38   Chief complaint     Respiratory failure     Peritonitis       Subjective:   Unable to obtain due to patient's condition and mechanical ventilation  Objective:   Vital signs  Temperature 96.9 Heart rate 99 Respiratory rate 22 Blood pressure 145/81 Pulse ox 97%    Exam Awake and alert, confused, chronically ill No new F.N deficits,  [place].AT Supple Neck,No JVD, No cervical lymphadenopathy appriciated.  Tracheostomy noted at midline Symmetrical Chest wall movement, decreased breath sounds bilaterally with scattered rhonchi RRR,No Gallops,Rubs or new Murmurs, No Parasternal Heave +ve B.Sounds, Abd Soft, Non tender, No organomegaly appriciated, No rebound - guarding or rigidity. PEG tube and colostomy noted No Cyanosis, Clubbing or edema,  Multiple wounds noted  I&Os 2964/2375 Foley - no Trach #6 XL changed on 3/16   Data Review   CBC  Recent Labs Lab 10/15/13 0635 10/16/13 0500 10/17/13 0500  WBC 20.7* 22.0* 20.5*  HGB 6.5* 8.8* 8.6*  HCT 20.9* 27.3* 27.4*  PLT 303 302 290  MCV 97.7 93.2 95.5  MCH 30.4 30.0 30.0  MCHC 31.1 32.2 31.4  RDW 15.8* 16.7* 16.0*    Chemistries   Recent Labs Lab 10/12/13 0630 10/13/13 0600 10/14/13 0510 10/15/13 0635 10/16/13 0500 10/17/13 0500 10/18/13 0605  NA 142 136* 142 140 143 140 141  K 4.8 4.6 5.0 4.9 4.8 4.7 5.5*  CL 94* 90* 96 93* 93* 91* 93*  CO2 38* 37* 37* 40* 38* 38* 38*  GLUCOSE 116* 128* 137* 143* 109* 156* 146*  BUN 45* 45* 47* 48* 47* 51* 59*   CREATININE 1.30 1.32 1.29 1.35 1.29 1.31 1.31  CALCIUM 8.0* 8.1* 8.3* 8.7 8.4 8.4 8.9  MG 1.7 1.6  --   --   --   --   --     Micro Results   Chest x-ray; improved aeration  Assessment & Plan   VDRF; ARDS continue PSV trials  Bowel perforation status post colostomy Moderate bilateral pleural effusion status post thoracentesis 2/25 Renal failure;  resolved Atrial fib heart rate is improving, continue with the digoxin and Lopressor Hemoptysis status post bronchoscopy on 219 that showed trachea erosions 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   Plan  Start meropenem IV  Discussed with pulmonary critical care and ID Code Status: Limited DVT Prophylaxis  SCDs   Merton Border M.D on 10/18/2013 at 12:36 PM

## 2013-10-19 LAB — URINE CULTURE

## 2013-10-19 LAB — BASIC METABOLIC PANEL
BUN: 57 mg/dL — ABNORMAL HIGH (ref 6–23)
CO2: 41 mEq/L (ref 19–32)
Calcium: 8.6 mg/dL (ref 8.4–10.5)
Chloride: 91 mEq/L — ABNORMAL LOW (ref 96–112)
Creatinine, Ser: 1.33 mg/dL (ref 0.50–1.35)
GFR, EST AFRICAN AMERICAN: 61 mL/min — AB (ref >90)
GFR, EST NON AFRICAN AMERICAN: 53 mL/min — AB (ref >90)
Glucose, Bld: 131 mg/dL — ABNORMAL HIGH (ref 70–99)
POTASSIUM: 4.3 meq/L (ref 3.7–5.3)
Sodium: 141 mEq/L (ref 137–147)

## 2013-10-19 LAB — CBC
HCT: 27.7 % — ABNORMAL LOW (ref 39.0–52.0)
HEMOGLOBIN: 8.5 g/dL — AB (ref 13.0–17.0)
MCH: 29.6 pg (ref 26.0–34.0)
MCHC: 30.7 g/dL (ref 30.0–36.0)
MCV: 96.5 fL (ref 78.0–100.0)
PLATELETS: 305 10*3/uL (ref 150–400)
RBC: 2.87 MIL/uL — AB (ref 4.22–5.81)
RDW: 15.4 % (ref 11.5–15.5)
WBC: 18.3 10*3/uL — AB (ref 4.0–10.5)

## 2013-10-19 NOTE — Progress Notes (Signed)
[facility]                                                                                              Progress note     Patient Demographics  Jason Harmon, is a 73 y.o. male  [id number]:[id number]  [id number]  DOB - [birth year]  Admit date - 09/04/2013  Admitting Physician Merton Border, MD  Outpatient Primary MD for the patient is Pcp Not In System  LOS - 39   Chief complaint     Respiratory failure     Peritonitis       Subjective:   Unable to obtain due to patient's condition and mechanical ventilation  Objective:   Vital signs  Temperature 98.6 Heart rate 98 Respiratory rate 20 Blood pressure 144/86 Pulse ox 99%    Exam Awake and alert, confused, chronically ill No new F.N deficits,  [place].AT Supple Neck,No JVD, No cervical lymphadenopathy appriciated.  Tracheostomy noted at midline Symmetrical Chest wall movement, decreased breath sounds bilaterally with scattered rhonchi RRR,No Gallops,Rubs or new Murmurs, No Parasternal Heave +ve B.Sounds, Abd Soft, Non tender, No organomegaly appriciated, No rebound - guarding or rigidity. PEG tube and colostomy noted No Cyanosis, Clubbing or edema,  Multiple wounds noted  I&Os 3177/3575 Foley - no Trach #6 XL changed on 3/16   Data Review   CBC  Recent Labs Lab 10/15/13 0635 10/16/13 0500 10/17/13 0500 10/19/13 0500  WBC 20.7* 22.0* 20.5* 18.3*  HGB 6.5* 8.8* 8.6* 8.5*  HCT 20.9* 27.3* 27.4* 27.7*  PLT 303 302 290 305  MCV 97.7 93.2 95.5 96.5  MCH 30.4 30.0 30.0 29.6  MCHC 31.1 32.2 31.4 30.7  RDW 15.8* 16.7* 16.0* 15.4    Chemistries   Recent Labs Lab 10/13/13 0600  10/15/13 0635 10/16/13 0500 10/17/13 0500 10/18/13 0605 10/19/13 0500  NA 136*  < > 140 143 140 141 141  K 4.6  < > 4.9 4.8 4.7 5.5* 4.3  CL 90*  < > 93* 93* 91* 93* 91*  CO2 37*  < > 40* 38* 38* 38* 41*  GLUCOSE 128*  < > 143* 109* 156* 146*  131*  BUN 45*  < > 48* 47* 51* 59* 57*  CREATININE 1.32  < > 1.35 1.29 1.31 1.31 1.33  CALCIUM 8.1*  < > 8.7 8.4 8.4 8.9 8.6  MG 1.6  --   --   --   --   --   --   < > = values in this interval not displayed.  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   Plan  Continue IV antibiotics Follow urine culture Check CBC in a.m.  Discussed with pulmonary critical care and ID Code Status: Limited DVT Prophylaxis  SCDs   Merton Border M.D on 10/19/2013 at 3:44 PM

## 2013-10-20 LAB — BASIC METABOLIC PANEL WITH GFR
BUN: 57 mg/dL — ABNORMAL HIGH (ref 6–23)
CO2: 39 meq/L — ABNORMAL HIGH (ref 19–32)
Calcium: 8.6 mg/dL (ref 8.4–10.5)
Chloride: 94 meq/L — ABNORMAL LOW (ref 96–112)
Creatinine, Ser: 1.29 mg/dL (ref 0.50–1.35)
GFR calc Af Amer: 63 mL/min — ABNORMAL LOW
GFR calc non Af Amer: 55 mL/min — ABNORMAL LOW
Glucose, Bld: 144 mg/dL — ABNORMAL HIGH (ref 70–99)
Potassium: 4.6 meq/L (ref 3.7–5.3)
Sodium: 142 meq/L (ref 137–147)

## 2013-10-20 LAB — CBC
HCT: 26.4 % — ABNORMAL LOW (ref 39.0–52.0)
HEMOGLOBIN: 8.4 g/dL — AB (ref 13.0–17.0)
MCH: 30.8 pg (ref 26.0–34.0)
MCHC: 31.8 g/dL (ref 30.0–36.0)
MCV: 96.7 fL (ref 78.0–100.0)
Platelets: 263 10*3/uL (ref 150–400)
RBC: 2.73 MIL/uL — AB (ref 4.22–5.81)
RDW: 15.5 % (ref 11.5–15.5)
WBC: 16.2 10*3/uL — AB (ref 4.0–10.5)

## 2013-10-20 NOTE — Progress Notes (Signed)
[facility]                                                                                              Progress note     Patient Demographics  Jason Harmon, is a 73 y.o. male  WNU:[id number]  IHK:[id number]  DOB - [birth year]  Admit date - 09/04/2013  Admitting Physician Merton Border, MD  Outpatient Primary MD for the patient is Pcp Not In System  LOS - 36   Chief complaint     Respiratory failure     Peritonitis       Subjective:   Unable to obtain due to patient's condition and mechanical ventilation  Objective:   Vital signs  Temperature 99 Heart rate 85 Respiratory rate 20 Blood pressure 122/66 Pulse ox 100%    Exam Awake and alert, confused, chronically ill No new F.N deficits,  [place].AT Supple Neck,No JVD, No cervical lymphadenopathy appriciated.  Tracheostomy noted at midline Symmetrical Chest wall movement, decreased breath sounds bilaterally with scattered rhonchi RRR,No Gallops,Rubs or new Murmurs, No Parasternal Heave +ve B.Sounds, Abd Soft, Non tender, No organomegaly appriciated, No rebound - guarding or rigidity. PEG tube and colostomy noted No Cyanosis, Clubbing or edema,  Multiple wounds noted  I&Os 3240/1445 Foley - no Trach #6 XL changed on 3/16   Data Review   CBC  Recent Labs Lab 10/15/13 0635 10/16/13 0500 10/17/13 0500 10/19/13 0500 10/20/13 0630  WBC 20.7* 22.0* 20.5* 18.3* 16.2*  HGB 6.5* 8.8* 8.6* 8.5* 8.4*  HCT 20.9* 27.3* 27.4* 27.7* 26.4*  PLT 303 302 290 305 263  MCV 97.7 93.2 95.5 96.5 96.7  MCH 30.4 30.0 30.0 29.6 30.8  MCHC 31.1 32.2 31.4 30.7 31.8  RDW 15.8* 16.7* 16.0* 15.4 15.5    Chemistries   Recent Labs Lab 10/16/13 0500 10/17/13 0500 10/18/13 0605 10/19/13 0500 10/20/13 0630  NA 143 140 141 141 142  K 4.8 4.7 5.5* 4.3 4.6  CL 93* 91* 93* 91* 94*  CO2 38* 38* 38* 41* 39*  GLUCOSE 109* 156* 146* 131* 144*   BUN 47* 51* 59* 57* 57*  CREATININE 1.29 1.31 1.31 1.33 1.29  CALCIUM 8.4 8.4 8.9 8.6 8.6    Micro Results   Chest x-ray; improved aeration  Assessment & Plan   VDRF; ARDS continue PSV trials , held today because of apnea Bowel perforation status post colostomy Moderate bilateral pleural effusion status post thoracentesis 2/25 Renal failure;  resolved Atrial fib heart rate is improving, continue with the digoxin and Lopressor Hemoptysis status post bronchoscopy on 219 that showed trachea erosions 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   Plan  Can switch antibiotics to by mouth in a.m. Needs to be discharged to a vent facility  Code Status: Limited DVT Prophylaxis  SCDs   Merton Border M.D on 10/20/2013 at 12:03 PM

## 2013-10-21 DIAGNOSIS — E875 Hyperkalemia: Secondary | ICD-10-CM | POA: Diagnosis not present

## 2013-10-21 DIAGNOSIS — N390 Urinary tract infection, site not specified: Secondary | ICD-10-CM | POA: Diagnosis not present

## 2013-10-21 DIAGNOSIS — G9341 Metabolic encephalopathy: Secondary | ICD-10-CM | POA: Diagnosis not present

## 2013-10-21 DIAGNOSIS — J9620 Acute and chronic respiratory failure, unspecified whether with hypoxia or hypercapnia: Secondary | ICD-10-CM | POA: Diagnosis not present

## 2013-10-21 LAB — BASIC METABOLIC PANEL
BUN: 59 mg/dL — ABNORMAL HIGH (ref 6–23)
CALCIUM: 8.6 mg/dL (ref 8.4–10.5)
CHLORIDE: 93 meq/L — AB (ref 96–112)
CO2: 39 meq/L — AB (ref 19–32)
CREATININE: 1.24 mg/dL (ref 0.50–1.35)
GFR calc Af Amer: 66 mL/min — ABNORMAL LOW (ref >90)
GFR calc non Af Amer: 57 mL/min — ABNORMAL LOW (ref >90)
GLUCOSE: 145 mg/dL — AB (ref 70–99)
Potassium: 4.6 mEq/L (ref 3.7–5.3)
SODIUM: 140 meq/L (ref 137–147)

## 2013-10-21 NOTE — Progress Notes (Signed)
PULMONARY  / CRITICAL CARE MEDICINE  Name: Jason Harmon MRN: [id number] DOB: 05-25-1941  CONSULTATION DATE: 09/05/2013  BRIEF PATIENT DESCRIPTION: 73 yo who initially presented to an outside facility on 1/9 with SBO / ischemic bowel requiring laparotomy and small bowel resection. His course was complicated by ARF, now on dialysis and respiratory failure resulting in tracheostomy and prolonged mechanical ventilation.  Has required multiple surgeries for ischemic bowel with perforation.    INTERVAL HISTORY:  No acute change.  Slow to wean.   PHYSICAL EXAMINATION: Vital signs: Reviewed General: chronically ill appearing  Neuro: alert, awake, generalized weakness HEENT:  PERRL, #8 trach c/d/i Cardiovascular:  RRR Lungs:  Scattered rhonchi  Abdomen:  Dressing intact, large, soft Musculoskeletal:  Moves all extremities, no edema Skin:  Intact except for abd.  LABS:  Recent Labs Lab 10/17/13 0500 10/19/13 0500 10/20/13 0630  HGB 8.6* 8.5* 8.4*  HCT 27.4* 27.7* 26.4*  WBC 20.5* 18.3* 16.2*  PLT 290 305 263    Recent Labs Lab 10/17/13 0500 10/18/13 0605 10/19/13 0500 10/20/13 0630 10/21/13 0600  NA 140 141 141 142 140  K 4.7 5.5* 4.3 4.6 4.6  CL 91* 93* 91* 94* 93*  CO2 38* 38* 41* 39* 39*  GLUCOSE 156* 146* 131* 144* 145*  BUN 51* 59* 57* 57* 59*  CREATININE 1.31 1.31 1.33 1.29 1.24  CALCIUM 8.4 8.9 8.6 8.6 8.6   IMAGING:  No results found.  ASSESSMENT: Acute on chronic respiratory failure HCAP with Stenotrophomonas Maltophilia ARDS Pulmonary edema Tracheostomy status Recurrent hemoptysis Acute encephalopathy  Severe deconditioning   PLAN: Continue PS wean per protocol as tolerated - goal 16 hours of 15/5 today, goal in am to PS 12 if able  Likely needs chronic vent facility Minimie sedation Mobilize as able Aim for negative fluid status abx per primary    [name]. Titus Mould, MD, [place] Pgr: [facility]

## 2013-10-22 LAB — BASIC METABOLIC PANEL WITH GFR
BUN: 59 mg/dL — ABNORMAL HIGH (ref 6–23)
CO2: 39 meq/L — ABNORMAL HIGH (ref 19–32)
Calcium: 8.7 mg/dL (ref 8.4–10.5)
Chloride: 94 meq/L — ABNORMAL LOW (ref 96–112)
Creatinine, Ser: 1.25 mg/dL (ref 0.50–1.35)
GFR calc Af Amer: 66 mL/min — ABNORMAL LOW (ref >90)
GFR calc non Af Amer: 57 mL/min — ABNORMAL LOW (ref >90)
Glucose, Bld: 128 mg/dL — ABNORMAL HIGH (ref 70–99)
Potassium: 5.2 meq/L (ref 3.7–5.3)
Sodium: 141 meq/L (ref 137–147)

## 2013-10-23 ENCOUNTER — Ambulatory Visit (HOSPITAL_COMMUNITY)
Admission: AD | Admit: 2013-10-23 | Discharge: 2013-10-23 | Disposition: A | Discharge status: Home / Self Care | Payer: Medicare Other | Source: Other Acute Inpatient Hospital | Attending: Internal Medicine | Admitting: Internal Medicine

## 2013-10-23 DIAGNOSIS — Z431 Encounter for attention to gastrostomy: Secondary | ICD-10-CM | POA: Diagnosis not present

## 2013-10-23 DIAGNOSIS — R0602 Shortness of breath: Secondary | ICD-10-CM | POA: Diagnosis not present

## 2013-10-23 DIAGNOSIS — Z9911 Dependence on respirator [ventilator] status: Secondary | ICD-10-CM | POA: Insufficient documentation

## 2013-10-23 DIAGNOSIS — Z452 Encounter for adjustment and management of vascular access device: Secondary | ICD-10-CM | POA: Diagnosis not present

## 2013-10-23 DIAGNOSIS — E875 Hyperkalemia: Secondary | ICD-10-CM | POA: Diagnosis not present

## 2013-10-23 DIAGNOSIS — J9620 Acute and chronic respiratory failure, unspecified whether with hypoxia or hypercapnia: Secondary | ICD-10-CM | POA: Diagnosis not present

## 2013-10-23 DIAGNOSIS — J90 Pleural effusion, not elsewhere classified: Secondary | ICD-10-CM | POA: Diagnosis not present

## 2013-10-23 DIAGNOSIS — J9600 Acute respiratory failure, unspecified whether with hypoxia or hypercapnia: Secondary | ICD-10-CM | POA: Diagnosis not present

## 2013-10-23 DIAGNOSIS — R1310 Dysphagia, unspecified: Secondary | ICD-10-CM | POA: Diagnosis not present

## 2013-10-23 DIAGNOSIS — M79609 Pain in unspecified limb: Secondary | ICD-10-CM | POA: Diagnosis not present

## 2013-10-23 DIAGNOSIS — E46 Unspecified protein-calorie malnutrition: Secondary | ICD-10-CM | POA: Diagnosis not present

## 2013-10-23 DIAGNOSIS — I4891 Unspecified atrial fibrillation: Secondary | ICD-10-CM | POA: Diagnosis not present

## 2013-10-23 DIAGNOSIS — N189 Chronic kidney disease, unspecified: Secondary | ICD-10-CM | POA: Diagnosis not present

## 2013-10-23 DIAGNOSIS — B351 Tinea unguium: Secondary | ICD-10-CM | POA: Diagnosis not present

## 2013-10-23 DIAGNOSIS — G9341 Metabolic encephalopathy: Secondary | ICD-10-CM | POA: Diagnosis not present

## 2013-10-23 DIAGNOSIS — Z430 Encounter for attention to tracheostomy: Secondary | ICD-10-CM | POA: Diagnosis not present

## 2013-10-23 DIAGNOSIS — M6281 Muscle weakness (generalized): Secondary | ICD-10-CM | POA: Diagnosis not present

## 2013-10-23 DIAGNOSIS — K5733 Diverticulitis of large intestine without perforation or abscess with bleeding: Secondary | ICD-10-CM | POA: Diagnosis not present

## 2013-10-23 DIAGNOSIS — J984 Other disorders of lung: Secondary | ICD-10-CM | POA: Diagnosis not present

## 2013-10-23 DIAGNOSIS — N390 Urinary tract infection, site not specified: Secondary | ICD-10-CM | POA: Diagnosis not present

## 2013-10-23 LAB — CBC
HEMATOCRIT: 26.3 % — AB (ref 39.0–52.0)
Hemoglobin: 8 g/dL — ABNORMAL LOW (ref 13.0–17.0)
MCH: 29.9 pg (ref 26.0–34.0)
MCHC: 30.4 g/dL (ref 30.0–36.0)
MCV: 98.1 fL (ref 78.0–100.0)
Platelets: 280 10*3/uL (ref 150–400)
RBC: 2.68 MIL/uL — ABNORMAL LOW (ref 4.22–5.81)
RDW: 15.6 % — ABNORMAL HIGH (ref 11.5–15.5)
WBC: 14 10*3/uL — ABNORMAL HIGH (ref 4.0–10.5)

## 2013-10-23 LAB — BASIC METABOLIC PANEL
BUN: 61 mg/dL — AB (ref 6–23)
CHLORIDE: 96 meq/L (ref 96–112)
CO2: 36 mEq/L — ABNORMAL HIGH (ref 19–32)
CREATININE: 1.3 mg/dL (ref 0.50–1.35)
Calcium: 9.1 mg/dL (ref 8.4–10.5)
GFR calc non Af Amer: 54 mL/min — ABNORMAL LOW (ref >90)
GFR, EST AFRICAN AMERICAN: 63 mL/min — AB (ref >90)
GLUCOSE: 136 mg/dL — AB (ref 70–99)
POTASSIUM: 5.4 meq/L — AB (ref 3.7–5.3)
Sodium: 143 mEq/L (ref 137–147)

## 2013-10-23 LAB — MAGNESIUM: Magnesium: 2.2 mg/dL (ref 1.5–2.5)

## 2013-10-25 DIAGNOSIS — J9600 Acute respiratory failure, unspecified whether with hypoxia or hypercapnia: Secondary | ICD-10-CM | POA: Diagnosis not present

## 2013-11-05 DIAGNOSIS — J9600 Acute respiratory failure, unspecified whether with hypoxia or hypercapnia: Secondary | ICD-10-CM | POA: Diagnosis not present

## 2013-11-09 DIAGNOSIS — J9600 Acute respiratory failure, unspecified whether with hypoxia or hypercapnia: Secondary | ICD-10-CM | POA: Diagnosis not present

## 2013-11-19 DIAGNOSIS — G9341 Metabolic encephalopathy: Secondary | ICD-10-CM | POA: Diagnosis not present

## 2013-11-19 DIAGNOSIS — J9620 Acute and chronic respiratory failure, unspecified whether with hypoxia or hypercapnia: Secondary | ICD-10-CM | POA: Diagnosis not present

## 2013-11-19 DIAGNOSIS — E875 Hyperkalemia: Secondary | ICD-10-CM | POA: Diagnosis not present

## 2013-11-19 DIAGNOSIS — N390 Urinary tract infection, site not specified: Secondary | ICD-10-CM | POA: Diagnosis not present

## 2014-01-10 DIAGNOSIS — I4891 Unspecified atrial fibrillation: Secondary | ICD-10-CM | POA: Diagnosis not present

## 2014-01-10 DIAGNOSIS — M79609 Pain in unspecified limb: Secondary | ICD-10-CM | POA: Diagnosis not present

## 2014-01-10 DIAGNOSIS — B351 Tinea unguium: Secondary | ICD-10-CM | POA: Diagnosis not present

## 2014-01-30 DIAGNOSIS — J9620 Acute and chronic respiratory failure, unspecified whether with hypoxia or hypercapnia: Secondary | ICD-10-CM | POA: Diagnosis not present

## 2014-01-30 DIAGNOSIS — M2440 Recurrent dislocation, unspecified joint: Secondary | ICD-10-CM | POA: Diagnosis not present

## 2014-01-30 DIAGNOSIS — M6250 Muscle wasting and atrophy, not elsewhere classified, unspecified site: Secondary | ICD-10-CM | POA: Diagnosis not present

## 2014-01-30 DIAGNOSIS — M6281 Muscle weakness (generalized): Secondary | ICD-10-CM | POA: Diagnosis not present

## 2014-01-31 DIAGNOSIS — M6281 Muscle weakness (generalized): Secondary | ICD-10-CM | POA: Diagnosis not present

## 2014-01-31 DIAGNOSIS — K9422 Gastrostomy infection: Secondary | ICD-10-CM | POA: Diagnosis not present

## 2014-01-31 DIAGNOSIS — M6250 Muscle wasting and atrophy, not elsewhere classified, unspecified site: Secondary | ICD-10-CM | POA: Diagnosis not present

## 2014-01-31 DIAGNOSIS — N179 Acute kidney failure, unspecified: Secondary | ICD-10-CM | POA: Diagnosis not present

## 2014-01-31 DIAGNOSIS — M629 Disorder of muscle, unspecified: Secondary | ICD-10-CM | POA: Diagnosis not present

## 2014-01-31 DIAGNOSIS — J9620 Acute and chronic respiratory failure, unspecified whether with hypoxia or hypercapnia: Secondary | ICD-10-CM | POA: Diagnosis not present

## 2014-01-31 DIAGNOSIS — K08109 Complete loss of teeth, unspecified cause, unspecified class: Secondary | ICD-10-CM | POA: Diagnosis not present

## 2014-01-31 DIAGNOSIS — Z933 Colostomy status: Secondary | ICD-10-CM | POA: Diagnosis not present

## 2014-01-31 DIAGNOSIS — K117 Disturbances of salivary secretion: Secondary | ICD-10-CM | POA: Diagnosis not present

## 2014-01-31 DIAGNOSIS — M2440 Recurrent dislocation, unspecified joint: Secondary | ICD-10-CM | POA: Diagnosis not present

## 2014-01-31 DIAGNOSIS — J9600 Acute respiratory failure, unspecified whether with hypoxia or hypercapnia: Secondary | ICD-10-CM | POA: Diagnosis not present

## 2014-02-02 DIAGNOSIS — J9620 Acute and chronic respiratory failure, unspecified whether with hypoxia or hypercapnia: Secondary | ICD-10-CM | POA: Diagnosis not present

## 2014-02-02 DIAGNOSIS — M2440 Recurrent dislocation, unspecified joint: Secondary | ICD-10-CM | POA: Diagnosis not present

## 2014-02-02 DIAGNOSIS — M6281 Muscle weakness (generalized): Secondary | ICD-10-CM | POA: Diagnosis not present

## 2014-02-02 DIAGNOSIS — M6250 Muscle wasting and atrophy, not elsewhere classified, unspecified site: Secondary | ICD-10-CM | POA: Diagnosis not present

## 2014-02-03 DIAGNOSIS — M6281 Muscle weakness (generalized): Secondary | ICD-10-CM | POA: Diagnosis not present

## 2014-02-03 DIAGNOSIS — M2440 Recurrent dislocation, unspecified joint: Secondary | ICD-10-CM | POA: Diagnosis not present

## 2014-02-03 DIAGNOSIS — J9620 Acute and chronic respiratory failure, unspecified whether with hypoxia or hypercapnia: Secondary | ICD-10-CM | POA: Diagnosis not present

## 2014-02-03 DIAGNOSIS — M6250 Muscle wasting and atrophy, not elsewhere classified, unspecified site: Secondary | ICD-10-CM | POA: Diagnosis not present

## 2014-02-04 DIAGNOSIS — M2440 Recurrent dislocation, unspecified joint: Secondary | ICD-10-CM | POA: Diagnosis not present

## 2014-02-04 DIAGNOSIS — M6250 Muscle wasting and atrophy, not elsewhere classified, unspecified site: Secondary | ICD-10-CM | POA: Diagnosis not present

## 2014-02-04 DIAGNOSIS — J9620 Acute and chronic respiratory failure, unspecified whether with hypoxia or hypercapnia: Secondary | ICD-10-CM | POA: Diagnosis not present

## 2014-02-04 DIAGNOSIS — M6281 Muscle weakness (generalized): Secondary | ICD-10-CM | POA: Diagnosis not present

## 2014-02-05 DIAGNOSIS — M6250 Muscle wasting and atrophy, not elsewhere classified, unspecified site: Secondary | ICD-10-CM | POA: Diagnosis not present

## 2014-02-05 DIAGNOSIS — M2440 Recurrent dislocation, unspecified joint: Secondary | ICD-10-CM | POA: Diagnosis not present

## 2014-02-05 DIAGNOSIS — J9620 Acute and chronic respiratory failure, unspecified whether with hypoxia or hypercapnia: Secondary | ICD-10-CM | POA: Diagnosis not present

## 2014-02-05 DIAGNOSIS — M6281 Muscle weakness (generalized): Secondary | ICD-10-CM | POA: Diagnosis not present

## 2014-02-06 DIAGNOSIS — M6281 Muscle weakness (generalized): Secondary | ICD-10-CM | POA: Diagnosis not present

## 2014-02-06 DIAGNOSIS — M2440 Recurrent dislocation, unspecified joint: Secondary | ICD-10-CM | POA: Diagnosis not present

## 2014-02-06 DIAGNOSIS — M6250 Muscle wasting and atrophy, not elsewhere classified, unspecified site: Secondary | ICD-10-CM | POA: Diagnosis not present

## 2014-02-06 DIAGNOSIS — J9620 Acute and chronic respiratory failure, unspecified whether with hypoxia or hypercapnia: Secondary | ICD-10-CM | POA: Diagnosis not present

## 2014-02-07 DIAGNOSIS — K9422 Gastrostomy infection: Secondary | ICD-10-CM | POA: Diagnosis not present

## 2014-02-07 DIAGNOSIS — N179 Acute kidney failure, unspecified: Secondary | ICD-10-CM | POA: Diagnosis not present

## 2014-02-07 DIAGNOSIS — M2440 Recurrent dislocation, unspecified joint: Secondary | ICD-10-CM | POA: Diagnosis not present

## 2014-02-07 DIAGNOSIS — K117 Disturbances of salivary secretion: Secondary | ICD-10-CM | POA: Diagnosis not present

## 2014-02-07 DIAGNOSIS — M6250 Muscle wasting and atrophy, not elsewhere classified, unspecified site: Secondary | ICD-10-CM | POA: Diagnosis not present

## 2014-02-07 DIAGNOSIS — K08109 Complete loss of teeth, unspecified cause, unspecified class: Secondary | ICD-10-CM | POA: Diagnosis not present

## 2014-02-07 DIAGNOSIS — M6281 Muscle weakness (generalized): Secondary | ICD-10-CM | POA: Diagnosis not present

## 2014-02-07 DIAGNOSIS — J9620 Acute and chronic respiratory failure, unspecified whether with hypoxia or hypercapnia: Secondary | ICD-10-CM | POA: Diagnosis not present

## 2014-02-07 DIAGNOSIS — M629 Disorder of muscle, unspecified: Secondary | ICD-10-CM | POA: Diagnosis not present

## 2014-02-07 DIAGNOSIS — J9600 Acute respiratory failure, unspecified whether with hypoxia or hypercapnia: Secondary | ICD-10-CM | POA: Diagnosis not present

## 2014-02-07 DIAGNOSIS — Z933 Colostomy status: Secondary | ICD-10-CM | POA: Diagnosis not present

## 2014-02-09 DIAGNOSIS — J9620 Acute and chronic respiratory failure, unspecified whether with hypoxia or hypercapnia: Secondary | ICD-10-CM | POA: Diagnosis not present

## 2014-02-09 DIAGNOSIS — M6250 Muscle wasting and atrophy, not elsewhere classified, unspecified site: Secondary | ICD-10-CM | POA: Diagnosis not present

## 2014-02-09 DIAGNOSIS — M6281 Muscle weakness (generalized): Secondary | ICD-10-CM | POA: Diagnosis not present

## 2014-02-09 DIAGNOSIS — M2440 Recurrent dislocation, unspecified joint: Secondary | ICD-10-CM | POA: Diagnosis not present

## 2014-02-10 DIAGNOSIS — M6281 Muscle weakness (generalized): Secondary | ICD-10-CM | POA: Diagnosis not present

## 2014-02-10 DIAGNOSIS — J9620 Acute and chronic respiratory failure, unspecified whether with hypoxia or hypercapnia: Secondary | ICD-10-CM | POA: Diagnosis not present

## 2014-02-10 DIAGNOSIS — M2440 Recurrent dislocation, unspecified joint: Secondary | ICD-10-CM | POA: Diagnosis not present

## 2014-02-10 DIAGNOSIS — M6250 Muscle wasting and atrophy, not elsewhere classified, unspecified site: Secondary | ICD-10-CM | POA: Diagnosis not present

## 2014-02-11 DIAGNOSIS — J9620 Acute and chronic respiratory failure, unspecified whether with hypoxia or hypercapnia: Secondary | ICD-10-CM | POA: Diagnosis not present

## 2014-02-11 DIAGNOSIS — M6281 Muscle weakness (generalized): Secondary | ICD-10-CM | POA: Diagnosis not present

## 2014-02-11 DIAGNOSIS — M6250 Muscle wasting and atrophy, not elsewhere classified, unspecified site: Secondary | ICD-10-CM | POA: Diagnosis not present

## 2014-02-11 DIAGNOSIS — M2440 Recurrent dislocation, unspecified joint: Secondary | ICD-10-CM | POA: Diagnosis not present

## 2014-02-12 DIAGNOSIS — M2440 Recurrent dislocation, unspecified joint: Secondary | ICD-10-CM | POA: Diagnosis not present

## 2014-02-12 DIAGNOSIS — J9620 Acute and chronic respiratory failure, unspecified whether with hypoxia or hypercapnia: Secondary | ICD-10-CM | POA: Diagnosis not present

## 2014-02-12 DIAGNOSIS — M6281 Muscle weakness (generalized): Secondary | ICD-10-CM | POA: Diagnosis not present

## 2014-02-12 DIAGNOSIS — M6250 Muscle wasting and atrophy, not elsewhere classified, unspecified site: Secondary | ICD-10-CM | POA: Diagnosis not present

## 2014-02-13 DIAGNOSIS — M2440 Recurrent dislocation, unspecified joint: Secondary | ICD-10-CM | POA: Diagnosis not present

## 2014-02-13 DIAGNOSIS — M6250 Muscle wasting and atrophy, not elsewhere classified, unspecified site: Secondary | ICD-10-CM | POA: Diagnosis not present

## 2014-02-13 DIAGNOSIS — J9620 Acute and chronic respiratory failure, unspecified whether with hypoxia or hypercapnia: Secondary | ICD-10-CM | POA: Diagnosis not present

## 2014-02-13 DIAGNOSIS — M6281 Muscle weakness (generalized): Secondary | ICD-10-CM | POA: Diagnosis not present

## 2014-02-14 DIAGNOSIS — M6250 Muscle wasting and atrophy, not elsewhere classified, unspecified site: Secondary | ICD-10-CM | POA: Diagnosis not present

## 2014-02-14 DIAGNOSIS — M629 Disorder of muscle, unspecified: Secondary | ICD-10-CM | POA: Diagnosis not present

## 2014-02-14 DIAGNOSIS — N179 Acute kidney failure, unspecified: Secondary | ICD-10-CM | POA: Diagnosis not present

## 2014-02-14 DIAGNOSIS — Z933 Colostomy status: Secondary | ICD-10-CM | POA: Diagnosis not present

## 2014-02-14 DIAGNOSIS — K9422 Gastrostomy infection: Secondary | ICD-10-CM | POA: Diagnosis not present

## 2014-02-14 DIAGNOSIS — M2440 Recurrent dislocation, unspecified joint: Secondary | ICD-10-CM | POA: Diagnosis not present

## 2014-02-14 DIAGNOSIS — K08109 Complete loss of teeth, unspecified cause, unspecified class: Secondary | ICD-10-CM | POA: Diagnosis not present

## 2014-02-14 DIAGNOSIS — M6281 Muscle weakness (generalized): Secondary | ICD-10-CM | POA: Diagnosis not present

## 2014-02-14 DIAGNOSIS — J9600 Acute respiratory failure, unspecified whether with hypoxia or hypercapnia: Secondary | ICD-10-CM | POA: Diagnosis not present

## 2014-02-14 DIAGNOSIS — J9620 Acute and chronic respiratory failure, unspecified whether with hypoxia or hypercapnia: Secondary | ICD-10-CM | POA: Diagnosis not present

## 2014-02-14 DIAGNOSIS — K117 Disturbances of salivary secretion: Secondary | ICD-10-CM | POA: Diagnosis not present

## 2014-02-17 DIAGNOSIS — M2440 Recurrent dislocation, unspecified joint: Secondary | ICD-10-CM | POA: Diagnosis not present

## 2014-02-17 DIAGNOSIS — M6250 Muscle wasting and atrophy, not elsewhere classified, unspecified site: Secondary | ICD-10-CM | POA: Diagnosis not present

## 2014-02-17 DIAGNOSIS — J9620 Acute and chronic respiratory failure, unspecified whether with hypoxia or hypercapnia: Secondary | ICD-10-CM | POA: Diagnosis not present

## 2014-02-17 DIAGNOSIS — M6281 Muscle weakness (generalized): Secondary | ICD-10-CM | POA: Diagnosis not present

## 2014-02-18 DIAGNOSIS — M6281 Muscle weakness (generalized): Secondary | ICD-10-CM | POA: Diagnosis not present

## 2014-02-18 DIAGNOSIS — M2440 Recurrent dislocation, unspecified joint: Secondary | ICD-10-CM | POA: Diagnosis not present

## 2014-02-18 DIAGNOSIS — J9620 Acute and chronic respiratory failure, unspecified whether with hypoxia or hypercapnia: Secondary | ICD-10-CM | POA: Diagnosis not present

## 2014-02-18 DIAGNOSIS — M6250 Muscle wasting and atrophy, not elsewhere classified, unspecified site: Secondary | ICD-10-CM | POA: Diagnosis not present

## 2014-02-19 DIAGNOSIS — J9620 Acute and chronic respiratory failure, unspecified whether with hypoxia or hypercapnia: Secondary | ICD-10-CM | POA: Diagnosis not present

## 2014-02-19 DIAGNOSIS — M2440 Recurrent dislocation, unspecified joint: Secondary | ICD-10-CM | POA: Diagnosis not present

## 2014-02-19 DIAGNOSIS — M6281 Muscle weakness (generalized): Secondary | ICD-10-CM | POA: Diagnosis not present

## 2014-02-19 DIAGNOSIS — M6250 Muscle wasting and atrophy, not elsewhere classified, unspecified site: Secondary | ICD-10-CM | POA: Diagnosis not present

## 2014-02-20 DIAGNOSIS — Z931 Gastrostomy status: Secondary | ICD-10-CM | POA: Diagnosis not present

## 2014-02-20 DIAGNOSIS — M2440 Recurrent dislocation, unspecified joint: Secondary | ICD-10-CM | POA: Diagnosis not present

## 2014-02-20 DIAGNOSIS — M6281 Muscle weakness (generalized): Secondary | ICD-10-CM | POA: Diagnosis not present

## 2014-02-20 DIAGNOSIS — J690 Pneumonitis due to inhalation of food and vomit: Secondary | ICD-10-CM | POA: Diagnosis not present

## 2014-02-20 DIAGNOSIS — R509 Fever, unspecified: Secondary | ICD-10-CM | POA: Diagnosis not present

## 2014-02-20 DIAGNOSIS — J9620 Acute and chronic respiratory failure, unspecified whether with hypoxia or hypercapnia: Secondary | ICD-10-CM | POA: Diagnosis not present

## 2014-02-20 DIAGNOSIS — M6250 Muscle wasting and atrophy, not elsewhere classified, unspecified site: Secondary | ICD-10-CM | POA: Diagnosis not present

## 2014-02-20 DIAGNOSIS — R1310 Dysphagia, unspecified: Secondary | ICD-10-CM | POA: Diagnosis not present

## 2014-02-20 DIAGNOSIS — D72829 Elevated white blood cell count, unspecified: Secondary | ICD-10-CM | POA: Diagnosis not present

## 2014-02-21 DIAGNOSIS — M2440 Recurrent dislocation, unspecified joint: Secondary | ICD-10-CM | POA: Diagnosis not present

## 2014-02-21 DIAGNOSIS — M6281 Muscle weakness (generalized): Secondary | ICD-10-CM | POA: Diagnosis not present

## 2014-02-21 DIAGNOSIS — M6250 Muscle wasting and atrophy, not elsewhere classified, unspecified site: Secondary | ICD-10-CM | POA: Diagnosis not present

## 2014-02-21 DIAGNOSIS — J9620 Acute and chronic respiratory failure, unspecified whether with hypoxia or hypercapnia: Secondary | ICD-10-CM | POA: Diagnosis not present

## 2014-02-24 DIAGNOSIS — M6281 Muscle weakness (generalized): Secondary | ICD-10-CM | POA: Diagnosis not present

## 2014-02-24 DIAGNOSIS — J9620 Acute and chronic respiratory failure, unspecified whether with hypoxia or hypercapnia: Secondary | ICD-10-CM | POA: Diagnosis not present

## 2014-02-24 DIAGNOSIS — M2440 Recurrent dislocation, unspecified joint: Secondary | ICD-10-CM | POA: Diagnosis not present

## 2014-02-24 DIAGNOSIS — M6250 Muscle wasting and atrophy, not elsewhere classified, unspecified site: Secondary | ICD-10-CM | POA: Diagnosis not present

## 2014-02-25 DIAGNOSIS — M6281 Muscle weakness (generalized): Secondary | ICD-10-CM | POA: Diagnosis not present

## 2014-02-25 DIAGNOSIS — M2440 Recurrent dislocation, unspecified joint: Secondary | ICD-10-CM | POA: Diagnosis not present

## 2014-02-25 DIAGNOSIS — M6250 Muscle wasting and atrophy, not elsewhere classified, unspecified site: Secondary | ICD-10-CM | POA: Diagnosis not present

## 2014-02-25 DIAGNOSIS — J9620 Acute and chronic respiratory failure, unspecified whether with hypoxia or hypercapnia: Secondary | ICD-10-CM | POA: Diagnosis not present

## 2014-02-26 DIAGNOSIS — M2440 Recurrent dislocation, unspecified joint: Secondary | ICD-10-CM | POA: Diagnosis not present

## 2014-02-26 DIAGNOSIS — M6250 Muscle wasting and atrophy, not elsewhere classified, unspecified site: Secondary | ICD-10-CM | POA: Diagnosis not present

## 2014-02-26 DIAGNOSIS — M6281 Muscle weakness (generalized): Secondary | ICD-10-CM | POA: Diagnosis not present

## 2014-02-26 DIAGNOSIS — J9620 Acute and chronic respiratory failure, unspecified whether with hypoxia or hypercapnia: Secondary | ICD-10-CM | POA: Diagnosis not present

## 2014-02-27 DIAGNOSIS — M6281 Muscle weakness (generalized): Secondary | ICD-10-CM | POA: Diagnosis not present

## 2014-02-27 DIAGNOSIS — J9620 Acute and chronic respiratory failure, unspecified whether with hypoxia or hypercapnia: Secondary | ICD-10-CM | POA: Diagnosis not present

## 2014-02-27 DIAGNOSIS — M2440 Recurrent dislocation, unspecified joint: Secondary | ICD-10-CM | POA: Diagnosis not present

## 2014-02-27 DIAGNOSIS — M6250 Muscle wasting and atrophy, not elsewhere classified, unspecified site: Secondary | ICD-10-CM | POA: Diagnosis not present

## 2014-02-28 DIAGNOSIS — M2440 Recurrent dislocation, unspecified joint: Secondary | ICD-10-CM | POA: Diagnosis not present

## 2014-02-28 DIAGNOSIS — M6250 Muscle wasting and atrophy, not elsewhere classified, unspecified site: Secondary | ICD-10-CM | POA: Diagnosis not present

## 2014-02-28 DIAGNOSIS — M6281 Muscle weakness (generalized): Secondary | ICD-10-CM | POA: Diagnosis not present

## 2014-02-28 DIAGNOSIS — J9620 Acute and chronic respiratory failure, unspecified whether with hypoxia or hypercapnia: Secondary | ICD-10-CM | POA: Diagnosis not present

## 2014-03-03 DIAGNOSIS — M6250 Muscle wasting and atrophy, not elsewhere classified, unspecified site: Secondary | ICD-10-CM | POA: Diagnosis not present

## 2014-03-03 DIAGNOSIS — M2440 Recurrent dislocation, unspecified joint: Secondary | ICD-10-CM | POA: Diagnosis not present

## 2014-03-03 DIAGNOSIS — J9620 Acute and chronic respiratory failure, unspecified whether with hypoxia or hypercapnia: Secondary | ICD-10-CM | POA: Diagnosis not present

## 2014-03-03 DIAGNOSIS — M6281 Muscle weakness (generalized): Secondary | ICD-10-CM | POA: Diagnosis not present

## 2014-03-04 DIAGNOSIS — M6250 Muscle wasting and atrophy, not elsewhere classified, unspecified site: Secondary | ICD-10-CM | POA: Diagnosis not present

## 2014-03-04 DIAGNOSIS — M2440 Recurrent dislocation, unspecified joint: Secondary | ICD-10-CM | POA: Diagnosis not present

## 2014-03-04 DIAGNOSIS — J9620 Acute and chronic respiratory failure, unspecified whether with hypoxia or hypercapnia: Secondary | ICD-10-CM | POA: Diagnosis not present

## 2014-03-04 DIAGNOSIS — M6281 Muscle weakness (generalized): Secondary | ICD-10-CM | POA: Diagnosis not present

## 2014-03-05 DIAGNOSIS — M6281 Muscle weakness (generalized): Secondary | ICD-10-CM | POA: Diagnosis not present

## 2014-03-05 DIAGNOSIS — M2440 Recurrent dislocation, unspecified joint: Secondary | ICD-10-CM | POA: Diagnosis not present

## 2014-03-05 DIAGNOSIS — J9620 Acute and chronic respiratory failure, unspecified whether with hypoxia or hypercapnia: Secondary | ICD-10-CM | POA: Diagnosis not present

## 2014-03-05 DIAGNOSIS — M6250 Muscle wasting and atrophy, not elsewhere classified, unspecified site: Secondary | ICD-10-CM | POA: Diagnosis not present

## 2014-03-06 DIAGNOSIS — M6250 Muscle wasting and atrophy, not elsewhere classified, unspecified site: Secondary | ICD-10-CM | POA: Diagnosis not present

## 2014-03-06 DIAGNOSIS — J9620 Acute and chronic respiratory failure, unspecified whether with hypoxia or hypercapnia: Secondary | ICD-10-CM | POA: Diagnosis not present

## 2014-03-06 DIAGNOSIS — M6281 Muscle weakness (generalized): Secondary | ICD-10-CM | POA: Diagnosis not present

## 2014-03-06 DIAGNOSIS — M2440 Recurrent dislocation, unspecified joint: Secondary | ICD-10-CM | POA: Diagnosis not present

## 2014-03-06 DIAGNOSIS — Z0000 Encounter for general adult medical examination without abnormal findings: Secondary | ICD-10-CM | POA: Diagnosis not present

## 2014-03-07 DIAGNOSIS — M6250 Muscle wasting and atrophy, not elsewhere classified, unspecified site: Secondary | ICD-10-CM | POA: Diagnosis not present

## 2014-03-07 DIAGNOSIS — M6281 Muscle weakness (generalized): Secondary | ICD-10-CM | POA: Diagnosis not present

## 2014-03-07 DIAGNOSIS — M2440 Recurrent dislocation, unspecified joint: Secondary | ICD-10-CM | POA: Diagnosis not present

## 2014-03-07 DIAGNOSIS — J9620 Acute and chronic respiratory failure, unspecified whether with hypoxia or hypercapnia: Secondary | ICD-10-CM | POA: Diagnosis not present

## 2014-03-10 DIAGNOSIS — M6250 Muscle wasting and atrophy, not elsewhere classified, unspecified site: Secondary | ICD-10-CM | POA: Diagnosis not present

## 2014-03-10 DIAGNOSIS — M2440 Recurrent dislocation, unspecified joint: Secondary | ICD-10-CM | POA: Diagnosis not present

## 2014-03-10 DIAGNOSIS — J9620 Acute and chronic respiratory failure, unspecified whether with hypoxia or hypercapnia: Secondary | ICD-10-CM | POA: Diagnosis not present

## 2014-03-10 DIAGNOSIS — M6281 Muscle weakness (generalized): Secondary | ICD-10-CM | POA: Diagnosis not present

## 2014-03-11 DIAGNOSIS — M2440 Recurrent dislocation, unspecified joint: Secondary | ICD-10-CM | POA: Diagnosis not present

## 2014-03-11 DIAGNOSIS — M6250 Muscle wasting and atrophy, not elsewhere classified, unspecified site: Secondary | ICD-10-CM | POA: Diagnosis not present

## 2014-03-11 DIAGNOSIS — J9620 Acute and chronic respiratory failure, unspecified whether with hypoxia or hypercapnia: Secondary | ICD-10-CM | POA: Diagnosis not present

## 2014-03-11 DIAGNOSIS — M6281 Muscle weakness (generalized): Secondary | ICD-10-CM | POA: Diagnosis not present

## 2014-03-12 DIAGNOSIS — J9620 Acute and chronic respiratory failure, unspecified whether with hypoxia or hypercapnia: Secondary | ICD-10-CM | POA: Diagnosis not present

## 2014-03-12 DIAGNOSIS — M6281 Muscle weakness (generalized): Secondary | ICD-10-CM | POA: Diagnosis not present

## 2014-03-12 DIAGNOSIS — M6250 Muscle wasting and atrophy, not elsewhere classified, unspecified site: Secondary | ICD-10-CM | POA: Diagnosis not present

## 2014-03-12 DIAGNOSIS — M2440 Recurrent dislocation, unspecified joint: Secondary | ICD-10-CM | POA: Diagnosis not present

## 2014-03-13 DIAGNOSIS — N159 Renal tubulo-interstitial disease, unspecified: Secondary | ICD-10-CM | POA: Diagnosis not present

## 2014-03-13 DIAGNOSIS — J9620 Acute and chronic respiratory failure, unspecified whether with hypoxia or hypercapnia: Secondary | ICD-10-CM | POA: Diagnosis not present

## 2014-03-13 DIAGNOSIS — M6281 Muscle weakness (generalized): Secondary | ICD-10-CM | POA: Diagnosis not present

## 2014-03-13 DIAGNOSIS — M2440 Recurrent dislocation, unspecified joint: Secondary | ICD-10-CM | POA: Diagnosis not present

## 2014-03-13 DIAGNOSIS — M6250 Muscle wasting and atrophy, not elsewhere classified, unspecified site: Secondary | ICD-10-CM | POA: Diagnosis not present

## 2014-03-14 DIAGNOSIS — M2440 Recurrent dislocation, unspecified joint: Secondary | ICD-10-CM | POA: Diagnosis not present

## 2014-03-14 DIAGNOSIS — M6250 Muscle wasting and atrophy, not elsewhere classified, unspecified site: Secondary | ICD-10-CM | POA: Diagnosis not present

## 2014-03-14 DIAGNOSIS — J9620 Acute and chronic respiratory failure, unspecified whether with hypoxia or hypercapnia: Secondary | ICD-10-CM | POA: Diagnosis not present

## 2014-03-14 DIAGNOSIS — M6281 Muscle weakness (generalized): Secondary | ICD-10-CM | POA: Diagnosis not present

## 2014-03-15 ENCOUNTER — Other Ambulatory Visit: Payer: Self-pay | Admitting: Family Medicine

## 2014-03-15 DIAGNOSIS — R4182 Altered mental status, unspecified: Secondary | ICD-10-CM | POA: Diagnosis not present

## 2014-03-15 LAB — URINALYSIS, COMPLETE
BACTERIA: NONE SEEN
BLOOD: NEGATIVE
Bilirubin,UR: NEGATIVE
Glucose,UR: 150 mg/dL (ref 0–75)
Ketone: NEGATIVE
Leukocyte Esterase: NEGATIVE
Nitrite: NEGATIVE
Ph: 7 (ref 4.5–8.0)
Protein: 30
RBC,UR: 2 /HPF (ref 0–5)
Specific Gravity: 1.017 (ref 1.003–1.030)
Squamous Epithelial: <1

## 2014-03-15 LAB — BASIC METABOLIC PANEL
ANION GAP: 9 (ref 7–16)
BUN: 28 mg/dL — AB (ref 7–18)
Calcium, Total: 9.1 mg/dL (ref 8.5–10.1)
Chloride: 95 mmol/L — ABNORMAL LOW (ref 98–107)
Co2: 29 mmol/L (ref 21–32)
Creatinine: 1.13 mg/dL (ref 0.60–1.30)
EGFR (Non-African Amer.): >60
Glucose: 199 mg/dL — ABNORMAL HIGH (ref 65–99)
Osmolality: 277 (ref 275–301)
POTASSIUM: 5.1 mmol/L (ref 3.5–5.1)
SODIUM: 133 mmol/L — AB (ref 136–145)

## 2014-03-15 LAB — HEPATIC FUNCTION PANEL A (ARMC)
ALBUMIN: 1.9 g/dL — AB (ref 3.4–5.0)
ALT: 106 U/L — AB
AST: 72 U/L — AB (ref 15–37)
Alkaline Phosphatase: 169 U/L — ABNORMAL HIGH
Bilirubin,Total: 0.1 mg/dL — ABNORMAL LOW (ref 0.2–1.0)
TOTAL PROTEIN: 7.2 g/dL (ref 6.4–8.2)

## 2014-03-17 DIAGNOSIS — J9620 Acute and chronic respiratory failure, unspecified whether with hypoxia or hypercapnia: Secondary | ICD-10-CM | POA: Diagnosis not present

## 2014-03-17 DIAGNOSIS — M6250 Muscle wasting and atrophy, not elsewhere classified, unspecified site: Secondary | ICD-10-CM | POA: Diagnosis not present

## 2014-03-17 DIAGNOSIS — M6281 Muscle weakness (generalized): Secondary | ICD-10-CM | POA: Diagnosis not present

## 2014-03-17 DIAGNOSIS — M2440 Recurrent dislocation, unspecified joint: Secondary | ICD-10-CM | POA: Diagnosis not present

## 2014-03-17 LAB — URINE CULTURE

## 2014-03-18 DIAGNOSIS — M2440 Recurrent dislocation, unspecified joint: Secondary | ICD-10-CM | POA: Diagnosis not present

## 2014-03-18 DIAGNOSIS — J9620 Acute and chronic respiratory failure, unspecified whether with hypoxia or hypercapnia: Secondary | ICD-10-CM | POA: Diagnosis not present

## 2014-03-18 DIAGNOSIS — M6281 Muscle weakness (generalized): Secondary | ICD-10-CM | POA: Diagnosis not present

## 2014-03-18 DIAGNOSIS — M6250 Muscle wasting and atrophy, not elsewhere classified, unspecified site: Secondary | ICD-10-CM | POA: Diagnosis not present

## 2014-03-18 DIAGNOSIS — Z79899 Other long term (current) drug therapy: Secondary | ICD-10-CM | POA: Diagnosis not present

## 2014-03-18 DIAGNOSIS — Z0000 Encounter for general adult medical examination without abnormal findings: Secondary | ICD-10-CM | POA: Diagnosis not present

## 2014-03-19 DIAGNOSIS — J9620 Acute and chronic respiratory failure, unspecified whether with hypoxia or hypercapnia: Secondary | ICD-10-CM | POA: Diagnosis not present

## 2014-03-19 DIAGNOSIS — M2440 Recurrent dislocation, unspecified joint: Secondary | ICD-10-CM | POA: Diagnosis not present

## 2014-03-19 DIAGNOSIS — M6250 Muscle wasting and atrophy, not elsewhere classified, unspecified site: Secondary | ICD-10-CM | POA: Diagnosis not present

## 2014-03-19 DIAGNOSIS — M6281 Muscle weakness (generalized): Secondary | ICD-10-CM | POA: Diagnosis not present

## 2014-03-20 DIAGNOSIS — M6281 Muscle weakness (generalized): Secondary | ICD-10-CM | POA: Diagnosis not present

## 2014-03-20 DIAGNOSIS — J9620 Acute and chronic respiratory failure, unspecified whether with hypoxia or hypercapnia: Secondary | ICD-10-CM | POA: Diagnosis not present

## 2014-03-20 DIAGNOSIS — M2440 Recurrent dislocation, unspecified joint: Secondary | ICD-10-CM | POA: Diagnosis not present

## 2014-03-20 DIAGNOSIS — M6250 Muscle wasting and atrophy, not elsewhere classified, unspecified site: Secondary | ICD-10-CM | POA: Diagnosis not present

## 2014-03-21 DIAGNOSIS — D72829 Elevated white blood cell count, unspecified: Secondary | ICD-10-CM | POA: Diagnosis not present

## 2014-03-21 DIAGNOSIS — J189 Pneumonia, unspecified organism: Secondary | ICD-10-CM | POA: Diagnosis not present

## 2014-03-21 DIAGNOSIS — J690 Pneumonitis due to inhalation of food and vomit: Secondary | ICD-10-CM | POA: Diagnosis not present

## 2014-03-21 DIAGNOSIS — J9620 Acute and chronic respiratory failure, unspecified whether with hypoxia or hypercapnia: Secondary | ICD-10-CM | POA: Diagnosis not present

## 2014-03-21 DIAGNOSIS — Z931 Gastrostomy status: Secondary | ICD-10-CM | POA: Diagnosis not present

## 2014-03-21 DIAGNOSIS — M6281 Muscle weakness (generalized): Secondary | ICD-10-CM | POA: Diagnosis not present

## 2014-03-21 DIAGNOSIS — M2440 Recurrent dislocation, unspecified joint: Secondary | ICD-10-CM | POA: Diagnosis not present

## 2014-03-21 DIAGNOSIS — R509 Fever, unspecified: Secondary | ICD-10-CM | POA: Diagnosis not present

## 2014-03-21 DIAGNOSIS — M6250 Muscle wasting and atrophy, not elsewhere classified, unspecified site: Secondary | ICD-10-CM | POA: Diagnosis not present

## 2014-03-21 DIAGNOSIS — R1310 Dysphagia, unspecified: Secondary | ICD-10-CM | POA: Diagnosis not present

## 2014-03-24 DIAGNOSIS — M2440 Recurrent dislocation, unspecified joint: Secondary | ICD-10-CM | POA: Diagnosis not present

## 2014-03-24 DIAGNOSIS — M6250 Muscle wasting and atrophy, not elsewhere classified, unspecified site: Secondary | ICD-10-CM | POA: Diagnosis not present

## 2014-03-24 DIAGNOSIS — M6281 Muscle weakness (generalized): Secondary | ICD-10-CM | POA: Diagnosis not present

## 2014-03-24 DIAGNOSIS — J9620 Acute and chronic respiratory failure, unspecified whether with hypoxia or hypercapnia: Secondary | ICD-10-CM | POA: Diagnosis not present

## 2014-03-25 DIAGNOSIS — M6250 Muscle wasting and atrophy, not elsewhere classified, unspecified site: Secondary | ICD-10-CM | POA: Diagnosis not present

## 2014-03-25 DIAGNOSIS — M6281 Muscle weakness (generalized): Secondary | ICD-10-CM | POA: Diagnosis not present

## 2014-03-25 DIAGNOSIS — M2440 Recurrent dislocation, unspecified joint: Secondary | ICD-10-CM | POA: Diagnosis not present

## 2014-03-25 DIAGNOSIS — J9620 Acute and chronic respiratory failure, unspecified whether with hypoxia or hypercapnia: Secondary | ICD-10-CM | POA: Diagnosis not present

## 2014-03-26 DIAGNOSIS — M6250 Muscle wasting and atrophy, not elsewhere classified, unspecified site: Secondary | ICD-10-CM | POA: Diagnosis not present

## 2014-03-26 DIAGNOSIS — M6281 Muscle weakness (generalized): Secondary | ICD-10-CM | POA: Diagnosis not present

## 2014-03-26 DIAGNOSIS — M2440 Recurrent dislocation, unspecified joint: Secondary | ICD-10-CM | POA: Diagnosis not present

## 2014-03-26 DIAGNOSIS — J9620 Acute and chronic respiratory failure, unspecified whether with hypoxia or hypercapnia: Secondary | ICD-10-CM | POA: Diagnosis not present

## 2014-03-27 DIAGNOSIS — M2440 Recurrent dislocation, unspecified joint: Secondary | ICD-10-CM | POA: Diagnosis not present

## 2014-03-27 DIAGNOSIS — M6250 Muscle wasting and atrophy, not elsewhere classified, unspecified site: Secondary | ICD-10-CM | POA: Diagnosis not present

## 2014-03-27 DIAGNOSIS — M6281 Muscle weakness (generalized): Secondary | ICD-10-CM | POA: Diagnosis not present

## 2014-03-27 DIAGNOSIS — J9620 Acute and chronic respiratory failure, unspecified whether with hypoxia or hypercapnia: Secondary | ICD-10-CM | POA: Diagnosis not present

## 2014-03-28 DIAGNOSIS — M2440 Recurrent dislocation, unspecified joint: Secondary | ICD-10-CM | POA: Diagnosis not present

## 2014-03-28 DIAGNOSIS — M6250 Muscle wasting and atrophy, not elsewhere classified, unspecified site: Secondary | ICD-10-CM | POA: Diagnosis not present

## 2014-03-28 DIAGNOSIS — M6281 Muscle weakness (generalized): Secondary | ICD-10-CM | POA: Diagnosis not present

## 2014-03-28 DIAGNOSIS — J9620 Acute and chronic respiratory failure, unspecified whether with hypoxia or hypercapnia: Secondary | ICD-10-CM | POA: Diagnosis not present

## 2014-03-31 DIAGNOSIS — J9620 Acute and chronic respiratory failure, unspecified whether with hypoxia or hypercapnia: Secondary | ICD-10-CM | POA: Diagnosis not present

## 2014-03-31 DIAGNOSIS — M2440 Recurrent dislocation, unspecified joint: Secondary | ICD-10-CM | POA: Diagnosis not present

## 2014-03-31 DIAGNOSIS — M6281 Muscle weakness (generalized): Secondary | ICD-10-CM | POA: Diagnosis not present

## 2014-03-31 DIAGNOSIS — M6250 Muscle wasting and atrophy, not elsewhere classified, unspecified site: Secondary | ICD-10-CM | POA: Diagnosis not present

## 2014-04-01 DIAGNOSIS — J9620 Acute and chronic respiratory failure, unspecified whether with hypoxia or hypercapnia: Secondary | ICD-10-CM | POA: Diagnosis not present

## 2014-04-01 DIAGNOSIS — M6281 Muscle weakness (generalized): Secondary | ICD-10-CM | POA: Diagnosis not present

## 2014-04-01 DIAGNOSIS — M2440 Recurrent dislocation, unspecified joint: Secondary | ICD-10-CM | POA: Diagnosis not present

## 2014-04-01 DIAGNOSIS — M6250 Muscle wasting and atrophy, not elsewhere classified, unspecified site: Secondary | ICD-10-CM | POA: Diagnosis not present

## 2014-04-02 DIAGNOSIS — M6250 Muscle wasting and atrophy, not elsewhere classified, unspecified site: Secondary | ICD-10-CM | POA: Diagnosis not present

## 2014-04-02 DIAGNOSIS — J9620 Acute and chronic respiratory failure, unspecified whether with hypoxia or hypercapnia: Secondary | ICD-10-CM | POA: Diagnosis not present

## 2014-04-02 DIAGNOSIS — M2440 Recurrent dislocation, unspecified joint: Secondary | ICD-10-CM | POA: Diagnosis not present

## 2014-04-02 DIAGNOSIS — M6281 Muscle weakness (generalized): Secondary | ICD-10-CM | POA: Diagnosis not present

## 2014-04-03 DIAGNOSIS — J9620 Acute and chronic respiratory failure, unspecified whether with hypoxia or hypercapnia: Secondary | ICD-10-CM | POA: Diagnosis not present

## 2014-04-03 DIAGNOSIS — M2440 Recurrent dislocation, unspecified joint: Secondary | ICD-10-CM | POA: Diagnosis not present

## 2014-04-03 DIAGNOSIS — M6281 Muscle weakness (generalized): Secondary | ICD-10-CM | POA: Diagnosis not present

## 2014-04-03 DIAGNOSIS — M6250 Muscle wasting and atrophy, not elsewhere classified, unspecified site: Secondary | ICD-10-CM | POA: Diagnosis not present

## 2014-04-04 DIAGNOSIS — M6250 Muscle wasting and atrophy, not elsewhere classified, unspecified site: Secondary | ICD-10-CM | POA: Diagnosis not present

## 2014-04-04 DIAGNOSIS — J9620 Acute and chronic respiratory failure, unspecified whether with hypoxia or hypercapnia: Secondary | ICD-10-CM | POA: Diagnosis not present

## 2014-04-04 DIAGNOSIS — M6281 Muscle weakness (generalized): Secondary | ICD-10-CM | POA: Diagnosis not present

## 2014-04-04 DIAGNOSIS — M2440 Recurrent dislocation, unspecified joint: Secondary | ICD-10-CM | POA: Diagnosis not present

## 2014-04-07 DIAGNOSIS — M2440 Recurrent dislocation, unspecified joint: Secondary | ICD-10-CM | POA: Diagnosis not present

## 2014-04-07 DIAGNOSIS — M6250 Muscle wasting and atrophy, not elsewhere classified, unspecified site: Secondary | ICD-10-CM | POA: Diagnosis not present

## 2014-04-07 DIAGNOSIS — J9620 Acute and chronic respiratory failure, unspecified whether with hypoxia or hypercapnia: Secondary | ICD-10-CM | POA: Diagnosis not present

## 2014-04-07 DIAGNOSIS — M6281 Muscle weakness (generalized): Secondary | ICD-10-CM | POA: Diagnosis not present

## 2014-04-08 DIAGNOSIS — J9620 Acute and chronic respiratory failure, unspecified whether with hypoxia or hypercapnia: Secondary | ICD-10-CM | POA: Diagnosis not present

## 2014-04-08 DIAGNOSIS — M6281 Muscle weakness (generalized): Secondary | ICD-10-CM | POA: Diagnosis not present

## 2014-04-08 DIAGNOSIS — M6250 Muscle wasting and atrophy, not elsewhere classified, unspecified site: Secondary | ICD-10-CM | POA: Diagnosis not present

## 2014-04-08 DIAGNOSIS — M2440 Recurrent dislocation, unspecified joint: Secondary | ICD-10-CM | POA: Diagnosis not present

## 2014-04-09 DIAGNOSIS — M6250 Muscle wasting and atrophy, not elsewhere classified, unspecified site: Secondary | ICD-10-CM | POA: Diagnosis not present

## 2014-04-09 DIAGNOSIS — M2440 Recurrent dislocation, unspecified joint: Secondary | ICD-10-CM | POA: Diagnosis not present

## 2014-04-09 DIAGNOSIS — M6281 Muscle weakness (generalized): Secondary | ICD-10-CM | POA: Diagnosis not present

## 2014-04-09 DIAGNOSIS — J9620 Acute and chronic respiratory failure, unspecified whether with hypoxia or hypercapnia: Secondary | ICD-10-CM | POA: Diagnosis not present

## 2014-04-10 DIAGNOSIS — M2440 Recurrent dislocation, unspecified joint: Secondary | ICD-10-CM | POA: Diagnosis not present

## 2014-04-10 DIAGNOSIS — M6250 Muscle wasting and atrophy, not elsewhere classified, unspecified site: Secondary | ICD-10-CM | POA: Diagnosis not present

## 2014-04-10 DIAGNOSIS — J9620 Acute and chronic respiratory failure, unspecified whether with hypoxia or hypercapnia: Secondary | ICD-10-CM | POA: Diagnosis not present

## 2014-04-10 DIAGNOSIS — M6281 Muscle weakness (generalized): Secondary | ICD-10-CM | POA: Diagnosis not present

## 2014-04-11 DIAGNOSIS — M2440 Recurrent dislocation, unspecified joint: Secondary | ICD-10-CM | POA: Diagnosis not present

## 2014-04-11 DIAGNOSIS — M6250 Muscle wasting and atrophy, not elsewhere classified, unspecified site: Secondary | ICD-10-CM | POA: Diagnosis not present

## 2014-04-11 DIAGNOSIS — M6281 Muscle weakness (generalized): Secondary | ICD-10-CM | POA: Diagnosis not present

## 2014-04-11 DIAGNOSIS — J9620 Acute and chronic respiratory failure, unspecified whether with hypoxia or hypercapnia: Secondary | ICD-10-CM | POA: Diagnosis not present

## 2014-04-14 DIAGNOSIS — M6281 Muscle weakness (generalized): Secondary | ICD-10-CM | POA: Diagnosis not present

## 2014-04-14 DIAGNOSIS — J9620 Acute and chronic respiratory failure, unspecified whether with hypoxia or hypercapnia: Secondary | ICD-10-CM | POA: Diagnosis not present

## 2014-04-14 DIAGNOSIS — M6250 Muscle wasting and atrophy, not elsewhere classified, unspecified site: Secondary | ICD-10-CM | POA: Diagnosis not present

## 2014-04-14 DIAGNOSIS — M2440 Recurrent dislocation, unspecified joint: Secondary | ICD-10-CM | POA: Diagnosis not present

## 2014-04-15 DIAGNOSIS — J9620 Acute and chronic respiratory failure, unspecified whether with hypoxia or hypercapnia: Secondary | ICD-10-CM | POA: Diagnosis not present

## 2014-04-15 DIAGNOSIS — M2440 Recurrent dislocation, unspecified joint: Secondary | ICD-10-CM | POA: Diagnosis not present

## 2014-04-15 DIAGNOSIS — M6281 Muscle weakness (generalized): Secondary | ICD-10-CM | POA: Diagnosis not present

## 2014-04-15 DIAGNOSIS — M6250 Muscle wasting and atrophy, not elsewhere classified, unspecified site: Secondary | ICD-10-CM | POA: Diagnosis not present

## 2014-04-16 DIAGNOSIS — M6250 Muscle wasting and atrophy, not elsewhere classified, unspecified site: Secondary | ICD-10-CM | POA: Diagnosis not present

## 2014-04-16 DIAGNOSIS — M2440 Recurrent dislocation, unspecified joint: Secondary | ICD-10-CM | POA: Diagnosis not present

## 2014-04-16 DIAGNOSIS — J9620 Acute and chronic respiratory failure, unspecified whether with hypoxia or hypercapnia: Secondary | ICD-10-CM | POA: Diagnosis not present

## 2014-04-16 DIAGNOSIS — M6281 Muscle weakness (generalized): Secondary | ICD-10-CM | POA: Diagnosis not present

## 2014-04-17 DIAGNOSIS — M6281 Muscle weakness (generalized): Secondary | ICD-10-CM | POA: Diagnosis not present

## 2014-04-17 DIAGNOSIS — J9620 Acute and chronic respiratory failure, unspecified whether with hypoxia or hypercapnia: Secondary | ICD-10-CM | POA: Diagnosis not present

## 2014-04-17 DIAGNOSIS — R1312 Dysphagia, oropharyngeal phase: Secondary | ICD-10-CM | POA: Diagnosis not present

## 2014-04-17 DIAGNOSIS — M6250 Muscle wasting and atrophy, not elsewhere classified, unspecified site: Secondary | ICD-10-CM | POA: Diagnosis not present

## 2014-04-18 DIAGNOSIS — M6281 Muscle weakness (generalized): Secondary | ICD-10-CM | POA: Diagnosis not present

## 2014-04-18 DIAGNOSIS — J9620 Acute and chronic respiratory failure, unspecified whether with hypoxia or hypercapnia: Secondary | ICD-10-CM | POA: Diagnosis not present

## 2014-04-18 DIAGNOSIS — R1312 Dysphagia, oropharyngeal phase: Secondary | ICD-10-CM | POA: Diagnosis not present

## 2014-04-18 DIAGNOSIS — M6250 Muscle wasting and atrophy, not elsewhere classified, unspecified site: Secondary | ICD-10-CM | POA: Diagnosis not present

## 2014-04-29 DIAGNOSIS — R410 Disorientation, unspecified: Secondary | ICD-10-CM | POA: Diagnosis not present

## 2014-04-30 DIAGNOSIS — F05 Delirium due to known physiological condition: Secondary | ICD-10-CM | POA: Diagnosis not present

## 2014-04-30 DIAGNOSIS — D649 Anemia, unspecified: Secondary | ICD-10-CM | POA: Diagnosis not present

## 2014-04-30 DIAGNOSIS — E46 Unspecified protein-calorie malnutrition: Secondary | ICD-10-CM | POA: Diagnosis not present

## 2014-05-02 DIAGNOSIS — Z933 Colostomy status: Secondary | ICD-10-CM | POA: Diagnosis not present

## 2014-05-02 DIAGNOSIS — G3184 Mild cognitive impairment, so stated: Secondary | ICD-10-CM | POA: Diagnosis not present

## 2014-05-02 DIAGNOSIS — R1310 Dysphagia, unspecified: Secondary | ICD-10-CM | POA: Diagnosis not present

## 2014-05-05 ENCOUNTER — Ambulatory Visit: Payer: Self-pay | Admitting: Neurology

## 2014-05-05 DIAGNOSIS — F05 Delirium due to known physiological condition: Secondary | ICD-10-CM | POA: Diagnosis not present

## 2014-05-05 DIAGNOSIS — R410 Disorientation, unspecified: Secondary | ICD-10-CM | POA: Diagnosis not present

## 2014-05-07 DIAGNOSIS — R6889 Other general symptoms and signs: Secondary | ICD-10-CM | POA: Diagnosis not present

## 2014-05-09 DIAGNOSIS — Z933 Colostomy status: Secondary | ICD-10-CM | POA: Diagnosis not present

## 2014-05-09 DIAGNOSIS — M6281 Muscle weakness (generalized): Secondary | ICD-10-CM | POA: Diagnosis not present

## 2014-05-09 DIAGNOSIS — G3184 Mild cognitive impairment, so stated: Secondary | ICD-10-CM | POA: Diagnosis not present

## 2014-05-09 DIAGNOSIS — R1312 Dysphagia, oropharyngeal phase: Secondary | ICD-10-CM | POA: Diagnosis not present

## 2014-05-09 DIAGNOSIS — J9620 Acute and chronic respiratory failure, unspecified whether with hypoxia or hypercapnia: Secondary | ICD-10-CM | POA: Diagnosis not present

## 2014-05-09 DIAGNOSIS — M6250 Muscle wasting and atrophy, not elsewhere classified, unspecified site: Secondary | ICD-10-CM | POA: Diagnosis not present

## 2014-05-09 DIAGNOSIS — R1310 Dysphagia, unspecified: Secondary | ICD-10-CM | POA: Diagnosis not present

## 2014-05-12 DIAGNOSIS — M6250 Muscle wasting and atrophy, not elsewhere classified, unspecified site: Secondary | ICD-10-CM | POA: Diagnosis not present

## 2014-05-12 DIAGNOSIS — J9620 Acute and chronic respiratory failure, unspecified whether with hypoxia or hypercapnia: Secondary | ICD-10-CM | POA: Diagnosis not present

## 2014-05-12 DIAGNOSIS — R1312 Dysphagia, oropharyngeal phase: Secondary | ICD-10-CM | POA: Diagnosis not present

## 2014-05-12 DIAGNOSIS — M6281 Muscle weakness (generalized): Secondary | ICD-10-CM | POA: Diagnosis not present

## 2014-05-14 DIAGNOSIS — M6281 Muscle weakness (generalized): Secondary | ICD-10-CM | POA: Diagnosis not present

## 2014-05-14 DIAGNOSIS — J9620 Acute and chronic respiratory failure, unspecified whether with hypoxia or hypercapnia: Secondary | ICD-10-CM | POA: Diagnosis not present

## 2014-05-14 DIAGNOSIS — R1312 Dysphagia, oropharyngeal phase: Secondary | ICD-10-CM | POA: Diagnosis not present

## 2014-05-14 DIAGNOSIS — M6250 Muscle wasting and atrophy, not elsewhere classified, unspecified site: Secondary | ICD-10-CM | POA: Diagnosis not present

## 2014-05-15 DIAGNOSIS — R1312 Dysphagia, oropharyngeal phase: Secondary | ICD-10-CM | POA: Diagnosis not present

## 2014-05-15 DIAGNOSIS — M6250 Muscle wasting and atrophy, not elsewhere classified, unspecified site: Secondary | ICD-10-CM | POA: Diagnosis not present

## 2014-05-15 DIAGNOSIS — M6281 Muscle weakness (generalized): Secondary | ICD-10-CM | POA: Diagnosis not present

## 2014-05-15 DIAGNOSIS — J9620 Acute and chronic respiratory failure, unspecified whether with hypoxia or hypercapnia: Secondary | ICD-10-CM | POA: Diagnosis not present

## 2014-05-31 DIAGNOSIS — G3184 Mild cognitive impairment, so stated: Secondary | ICD-10-CM | POA: Diagnosis not present

## 2014-05-31 DIAGNOSIS — R1310 Dysphagia, unspecified: Secondary | ICD-10-CM | POA: Diagnosis not present

## 2014-05-31 DIAGNOSIS — J9690 Respiratory failure, unspecified, unspecified whether with hypoxia or hypercapnia: Secondary | ICD-10-CM | POA: Diagnosis not present

## 2014-07-04 DIAGNOSIS — Z933 Colostomy status: Secondary | ICD-10-CM | POA: Diagnosis not present

## 2014-07-04 DIAGNOSIS — G3184 Mild cognitive impairment, so stated: Secondary | ICD-10-CM | POA: Diagnosis not present

## 2014-07-04 DIAGNOSIS — I482 Chronic atrial fibrillation: Secondary | ICD-10-CM | POA: Diagnosis not present

## 2014-07-04 DIAGNOSIS — R1310 Dysphagia, unspecified: Secondary | ICD-10-CM | POA: Diagnosis not present

## 2014-07-09 DIAGNOSIS — E43 Unspecified severe protein-calorie malnutrition: Secondary | ICD-10-CM | POA: Diagnosis not present

## 2014-07-09 DIAGNOSIS — I482 Chronic atrial fibrillation: Secondary | ICD-10-CM | POA: Diagnosis not present

## 2014-07-10 DIAGNOSIS — I482 Chronic atrial fibrillation: Secondary | ICD-10-CM | POA: Diagnosis not present

## 2014-07-10 DIAGNOSIS — E43 Unspecified severe protein-calorie malnutrition: Secondary | ICD-10-CM | POA: Diagnosis not present

## 2014-07-12 DIAGNOSIS — E43 Unspecified severe protein-calorie malnutrition: Secondary | ICD-10-CM | POA: Diagnosis not present

## 2014-07-12 DIAGNOSIS — I482 Chronic atrial fibrillation: Secondary | ICD-10-CM | POA: Diagnosis not present

## 2014-07-14 DIAGNOSIS — D649 Anemia, unspecified: Secondary | ICD-10-CM | POA: Diagnosis not present

## 2014-07-14 DIAGNOSIS — L89153 Pressure ulcer of sacral region, stage 3: Secondary | ICD-10-CM | POA: Diagnosis not present

## 2014-07-14 DIAGNOSIS — Z87898 Personal history of other specified conditions: Secondary | ICD-10-CM | POA: Diagnosis not present

## 2014-07-14 DIAGNOSIS — I4891 Unspecified atrial fibrillation: Secondary | ICD-10-CM | POA: Diagnosis not present

## 2014-07-14 DIAGNOSIS — E039 Hypothyroidism, unspecified: Secondary | ICD-10-CM | POA: Diagnosis not present

## 2014-07-14 DIAGNOSIS — L89892 Pressure ulcer of other site, stage 2: Secondary | ICD-10-CM | POA: Diagnosis not present

## 2014-07-14 DIAGNOSIS — E43 Unspecified severe protein-calorie malnutrition: Secondary | ICD-10-CM | POA: Diagnosis not present

## 2014-07-14 DIAGNOSIS — I482 Chronic atrial fibrillation: Secondary | ICD-10-CM | POA: Diagnosis not present

## 2014-07-14 DIAGNOSIS — N3944 Nocturnal enuresis: Secondary | ICD-10-CM | POA: Diagnosis not present

## 2014-07-15 DIAGNOSIS — E43 Unspecified severe protein-calorie malnutrition: Secondary | ICD-10-CM | POA: Diagnosis not present

## 2014-07-15 DIAGNOSIS — I482 Chronic atrial fibrillation: Secondary | ICD-10-CM | POA: Diagnosis not present

## 2014-07-16 DIAGNOSIS — I482 Chronic atrial fibrillation: Secondary | ICD-10-CM | POA: Diagnosis not present

## 2014-07-16 DIAGNOSIS — E43 Unspecified severe protein-calorie malnutrition: Secondary | ICD-10-CM | POA: Diagnosis not present

## 2014-07-17 DIAGNOSIS — E43 Unspecified severe protein-calorie malnutrition: Secondary | ICD-10-CM | POA: Diagnosis not present

## 2014-07-17 DIAGNOSIS — I482 Chronic atrial fibrillation: Secondary | ICD-10-CM | POA: Diagnosis not present

## 2014-07-18 DIAGNOSIS — I482 Chronic atrial fibrillation: Secondary | ICD-10-CM | POA: Diagnosis not present

## 2014-07-18 DIAGNOSIS — E43 Unspecified severe protein-calorie malnutrition: Secondary | ICD-10-CM | POA: Diagnosis not present

## 2014-08-18 DIAGNOSIS — E43 Unspecified severe protein-calorie malnutrition: Secondary | ICD-10-CM | POA: Diagnosis not present

## 2014-08-18 DIAGNOSIS — I482 Chronic atrial fibrillation: Secondary | ICD-10-CM | POA: Diagnosis not present

## 2014-09-16 DIAGNOSIS — Z433 Encounter for attention to colostomy: Secondary | ICD-10-CM | POA: Diagnosis not present

## 2014-09-16 DIAGNOSIS — E43 Unspecified severe protein-calorie malnutrition: Secondary | ICD-10-CM | POA: Diagnosis not present

## 2014-09-16 DIAGNOSIS — I482 Chronic atrial fibrillation: Secondary | ICD-10-CM | POA: Diagnosis not present

## 2014-09-16 DIAGNOSIS — M6281 Muscle weakness (generalized): Secondary | ICD-10-CM | POA: Diagnosis not present

## 2014-09-17 DIAGNOSIS — Z433 Encounter for attention to colostomy: Secondary | ICD-10-CM | POA: Diagnosis not present

## 2014-09-17 DIAGNOSIS — M6281 Muscle weakness (generalized): Secondary | ICD-10-CM | POA: Diagnosis not present

## 2014-09-17 DIAGNOSIS — I482 Chronic atrial fibrillation: Secondary | ICD-10-CM | POA: Diagnosis not present

## 2014-09-17 DIAGNOSIS — E43 Unspecified severe protein-calorie malnutrition: Secondary | ICD-10-CM | POA: Diagnosis not present

## 2014-09-19 DIAGNOSIS — M6281 Muscle weakness (generalized): Secondary | ICD-10-CM | POA: Diagnosis not present

## 2014-09-19 DIAGNOSIS — I482 Chronic atrial fibrillation: Secondary | ICD-10-CM | POA: Diagnosis not present

## 2014-09-19 DIAGNOSIS — E43 Unspecified severe protein-calorie malnutrition: Secondary | ICD-10-CM | POA: Diagnosis not present

## 2014-09-19 DIAGNOSIS — Z433 Encounter for attention to colostomy: Secondary | ICD-10-CM | POA: Diagnosis not present

## 2014-09-22 DIAGNOSIS — Z433 Encounter for attention to colostomy: Secondary | ICD-10-CM | POA: Diagnosis not present

## 2014-09-22 DIAGNOSIS — I482 Chronic atrial fibrillation: Secondary | ICD-10-CM | POA: Diagnosis not present

## 2014-09-22 DIAGNOSIS — E43 Unspecified severe protein-calorie malnutrition: Secondary | ICD-10-CM | POA: Diagnosis not present

## 2014-09-22 DIAGNOSIS — M6281 Muscle weakness (generalized): Secondary | ICD-10-CM | POA: Diagnosis not present

## 2014-09-24 DIAGNOSIS — I482 Chronic atrial fibrillation: Secondary | ICD-10-CM | POA: Diagnosis not present

## 2014-09-24 DIAGNOSIS — M6281 Muscle weakness (generalized): Secondary | ICD-10-CM | POA: Diagnosis not present

## 2014-09-24 DIAGNOSIS — E43 Unspecified severe protein-calorie malnutrition: Secondary | ICD-10-CM | POA: Diagnosis not present

## 2014-09-24 DIAGNOSIS — Z433 Encounter for attention to colostomy: Secondary | ICD-10-CM | POA: Diagnosis not present

## 2014-09-25 DIAGNOSIS — Z433 Encounter for attention to colostomy: Secondary | ICD-10-CM | POA: Diagnosis not present

## 2014-09-25 DIAGNOSIS — I482 Chronic atrial fibrillation: Secondary | ICD-10-CM | POA: Diagnosis not present

## 2014-09-25 DIAGNOSIS — M6281 Muscle weakness (generalized): Secondary | ICD-10-CM | POA: Diagnosis not present

## 2014-09-25 DIAGNOSIS — E43 Unspecified severe protein-calorie malnutrition: Secondary | ICD-10-CM | POA: Diagnosis not present

## 2014-09-26 DIAGNOSIS — Z433 Encounter for attention to colostomy: Secondary | ICD-10-CM | POA: Diagnosis not present

## 2014-09-26 DIAGNOSIS — I482 Chronic atrial fibrillation: Secondary | ICD-10-CM | POA: Diagnosis not present

## 2014-09-26 DIAGNOSIS — M6281 Muscle weakness (generalized): Secondary | ICD-10-CM | POA: Diagnosis not present

## 2014-09-26 DIAGNOSIS — E43 Unspecified severe protein-calorie malnutrition: Secondary | ICD-10-CM | POA: Diagnosis not present

## 2014-09-28 DIAGNOSIS — Z433 Encounter for attention to colostomy: Secondary | ICD-10-CM | POA: Diagnosis not present

## 2014-09-28 DIAGNOSIS — E43 Unspecified severe protein-calorie malnutrition: Secondary | ICD-10-CM | POA: Diagnosis not present

## 2014-09-28 DIAGNOSIS — M6281 Muscle weakness (generalized): Secondary | ICD-10-CM | POA: Diagnosis not present

## 2014-09-28 DIAGNOSIS — I482 Chronic atrial fibrillation: Secondary | ICD-10-CM | POA: Diagnosis not present

## 2014-09-29 DIAGNOSIS — Z433 Encounter for attention to colostomy: Secondary | ICD-10-CM | POA: Diagnosis not present

## 2014-09-29 DIAGNOSIS — I482 Chronic atrial fibrillation: Secondary | ICD-10-CM | POA: Diagnosis not present

## 2014-09-29 DIAGNOSIS — M6281 Muscle weakness (generalized): Secondary | ICD-10-CM | POA: Diagnosis not present

## 2014-09-29 DIAGNOSIS — E43 Unspecified severe protein-calorie malnutrition: Secondary | ICD-10-CM | POA: Diagnosis not present

## 2014-10-01 DIAGNOSIS — M6281 Muscle weakness (generalized): Secondary | ICD-10-CM | POA: Diagnosis not present

## 2014-10-01 DIAGNOSIS — Z433 Encounter for attention to colostomy: Secondary | ICD-10-CM | POA: Diagnosis not present

## 2014-10-01 DIAGNOSIS — I482 Chronic atrial fibrillation: Secondary | ICD-10-CM | POA: Diagnosis not present

## 2014-10-01 DIAGNOSIS — E43 Unspecified severe protein-calorie malnutrition: Secondary | ICD-10-CM | POA: Diagnosis not present

## 2014-10-02 DIAGNOSIS — Z433 Encounter for attention to colostomy: Secondary | ICD-10-CM | POA: Diagnosis not present

## 2014-10-02 DIAGNOSIS — M6281 Muscle weakness (generalized): Secondary | ICD-10-CM | POA: Diagnosis not present

## 2014-10-02 DIAGNOSIS — E43 Unspecified severe protein-calorie malnutrition: Secondary | ICD-10-CM | POA: Diagnosis not present

## 2014-10-02 DIAGNOSIS — I482 Chronic atrial fibrillation: Secondary | ICD-10-CM | POA: Diagnosis not present

## 2014-10-03 DIAGNOSIS — I482 Chronic atrial fibrillation: Secondary | ICD-10-CM | POA: Diagnosis not present

## 2014-10-03 DIAGNOSIS — M6281 Muscle weakness (generalized): Secondary | ICD-10-CM | POA: Diagnosis not present

## 2014-10-03 DIAGNOSIS — E43 Unspecified severe protein-calorie malnutrition: Secondary | ICD-10-CM | POA: Diagnosis not present

## 2014-10-03 DIAGNOSIS — Z433 Encounter for attention to colostomy: Secondary | ICD-10-CM | POA: Diagnosis not present

## 2014-10-06 DIAGNOSIS — E43 Unspecified severe protein-calorie malnutrition: Secondary | ICD-10-CM | POA: Diagnosis not present

## 2014-10-06 DIAGNOSIS — Z433 Encounter for attention to colostomy: Secondary | ICD-10-CM | POA: Diagnosis not present

## 2014-10-06 DIAGNOSIS — M6281 Muscle weakness (generalized): Secondary | ICD-10-CM | POA: Diagnosis not present

## 2014-10-06 DIAGNOSIS — I482 Chronic atrial fibrillation: Secondary | ICD-10-CM | POA: Diagnosis not present

## 2014-10-07 DIAGNOSIS — I482 Chronic atrial fibrillation: Secondary | ICD-10-CM | POA: Diagnosis not present

## 2014-10-07 DIAGNOSIS — Z433 Encounter for attention to colostomy: Secondary | ICD-10-CM | POA: Diagnosis not present

## 2014-10-07 DIAGNOSIS — E43 Unspecified severe protein-calorie malnutrition: Secondary | ICD-10-CM | POA: Diagnosis not present

## 2014-10-07 DIAGNOSIS — M6281 Muscle weakness (generalized): Secondary | ICD-10-CM | POA: Diagnosis not present

## 2014-10-10 DIAGNOSIS — I482 Chronic atrial fibrillation: Secondary | ICD-10-CM | POA: Diagnosis not present

## 2014-10-10 DIAGNOSIS — E43 Unspecified severe protein-calorie malnutrition: Secondary | ICD-10-CM | POA: Diagnosis not present

## 2014-10-10 DIAGNOSIS — M6281 Muscle weakness (generalized): Secondary | ICD-10-CM | POA: Diagnosis not present

## 2014-10-10 DIAGNOSIS — Z433 Encounter for attention to colostomy: Secondary | ICD-10-CM | POA: Diagnosis not present

## 2014-10-13 DIAGNOSIS — M6281 Muscle weakness (generalized): Secondary | ICD-10-CM | POA: Diagnosis not present

## 2014-10-13 DIAGNOSIS — E43 Unspecified severe protein-calorie malnutrition: Secondary | ICD-10-CM | POA: Diagnosis not present

## 2014-10-13 DIAGNOSIS — I482 Chronic atrial fibrillation: Secondary | ICD-10-CM | POA: Diagnosis not present

## 2014-10-13 DIAGNOSIS — Z433 Encounter for attention to colostomy: Secondary | ICD-10-CM | POA: Diagnosis not present

## 2014-10-15 DIAGNOSIS — Z433 Encounter for attention to colostomy: Secondary | ICD-10-CM | POA: Diagnosis not present

## 2014-10-15 DIAGNOSIS — M6281 Muscle weakness (generalized): Secondary | ICD-10-CM | POA: Diagnosis not present

## 2014-10-15 DIAGNOSIS — E43 Unspecified severe protein-calorie malnutrition: Secondary | ICD-10-CM | POA: Diagnosis not present

## 2014-10-15 DIAGNOSIS — I482 Chronic atrial fibrillation: Secondary | ICD-10-CM | POA: Diagnosis not present

## 2014-10-17 DIAGNOSIS — Z433 Encounter for attention to colostomy: Secondary | ICD-10-CM | POA: Diagnosis not present

## 2014-10-17 DIAGNOSIS — E43 Unspecified severe protein-calorie malnutrition: Secondary | ICD-10-CM | POA: Diagnosis not present

## 2014-10-17 DIAGNOSIS — I482 Chronic atrial fibrillation: Secondary | ICD-10-CM | POA: Diagnosis not present

## 2014-10-17 DIAGNOSIS — M6281 Muscle weakness (generalized): Secondary | ICD-10-CM | POA: Diagnosis not present

## 2014-10-20 DIAGNOSIS — I482 Chronic atrial fibrillation: Secondary | ICD-10-CM | POA: Diagnosis not present

## 2014-10-20 DIAGNOSIS — Z433 Encounter for attention to colostomy: Secondary | ICD-10-CM | POA: Diagnosis not present

## 2014-10-20 DIAGNOSIS — E43 Unspecified severe protein-calorie malnutrition: Secondary | ICD-10-CM | POA: Diagnosis not present

## 2014-10-20 DIAGNOSIS — M6281 Muscle weakness (generalized): Secondary | ICD-10-CM | POA: Diagnosis not present

## 2014-10-22 DIAGNOSIS — Z433 Encounter for attention to colostomy: Secondary | ICD-10-CM | POA: Diagnosis not present

## 2014-10-22 DIAGNOSIS — I482 Chronic atrial fibrillation: Secondary | ICD-10-CM | POA: Diagnosis not present

## 2014-10-22 DIAGNOSIS — E43 Unspecified severe protein-calorie malnutrition: Secondary | ICD-10-CM | POA: Diagnosis not present

## 2014-10-22 DIAGNOSIS — M6281 Muscle weakness (generalized): Secondary | ICD-10-CM | POA: Diagnosis not present

## 2014-10-24 DIAGNOSIS — E43 Unspecified severe protein-calorie malnutrition: Secondary | ICD-10-CM | POA: Diagnosis not present

## 2014-10-24 DIAGNOSIS — M6281 Muscle weakness (generalized): Secondary | ICD-10-CM | POA: Diagnosis not present

## 2014-10-24 DIAGNOSIS — Z433 Encounter for attention to colostomy: Secondary | ICD-10-CM | POA: Diagnosis not present

## 2014-10-24 DIAGNOSIS — I482 Chronic atrial fibrillation: Secondary | ICD-10-CM | POA: Diagnosis not present

## 2014-10-27 DIAGNOSIS — M6281 Muscle weakness (generalized): Secondary | ICD-10-CM | POA: Diagnosis not present

## 2014-10-27 DIAGNOSIS — Z433 Encounter for attention to colostomy: Secondary | ICD-10-CM | POA: Diagnosis not present

## 2014-10-27 DIAGNOSIS — I482 Chronic atrial fibrillation: Secondary | ICD-10-CM | POA: Diagnosis not present

## 2014-10-27 DIAGNOSIS — E43 Unspecified severe protein-calorie malnutrition: Secondary | ICD-10-CM | POA: Diagnosis not present

## 2014-10-29 DIAGNOSIS — Z433 Encounter for attention to colostomy: Secondary | ICD-10-CM | POA: Diagnosis not present

## 2014-10-29 DIAGNOSIS — E43 Unspecified severe protein-calorie malnutrition: Secondary | ICD-10-CM | POA: Diagnosis not present

## 2014-10-29 DIAGNOSIS — M6281 Muscle weakness (generalized): Secondary | ICD-10-CM | POA: Diagnosis not present

## 2014-10-29 DIAGNOSIS — I482 Chronic atrial fibrillation: Secondary | ICD-10-CM | POA: Diagnosis not present

## 2014-10-31 DIAGNOSIS — E43 Unspecified severe protein-calorie malnutrition: Secondary | ICD-10-CM | POA: Diagnosis not present

## 2014-10-31 DIAGNOSIS — M6281 Muscle weakness (generalized): Secondary | ICD-10-CM | POA: Diagnosis not present

## 2014-10-31 DIAGNOSIS — I482 Chronic atrial fibrillation: Secondary | ICD-10-CM | POA: Diagnosis not present

## 2014-10-31 DIAGNOSIS — Z433 Encounter for attention to colostomy: Secondary | ICD-10-CM | POA: Diagnosis not present

## 2014-11-03 DIAGNOSIS — I482 Chronic atrial fibrillation: Secondary | ICD-10-CM | POA: Diagnosis not present

## 2014-11-03 DIAGNOSIS — E43 Unspecified severe protein-calorie malnutrition: Secondary | ICD-10-CM | POA: Diagnosis not present

## 2014-11-03 DIAGNOSIS — M6281 Muscle weakness (generalized): Secondary | ICD-10-CM | POA: Diagnosis not present

## 2014-11-03 DIAGNOSIS — Z433 Encounter for attention to colostomy: Secondary | ICD-10-CM | POA: Diagnosis not present

## 2014-11-04 DIAGNOSIS — I482 Chronic atrial fibrillation: Secondary | ICD-10-CM | POA: Diagnosis not present

## 2014-11-04 DIAGNOSIS — M6281 Muscle weakness (generalized): Secondary | ICD-10-CM | POA: Diagnosis not present

## 2014-11-04 DIAGNOSIS — E43 Unspecified severe protein-calorie malnutrition: Secondary | ICD-10-CM | POA: Diagnosis not present

## 2014-11-04 DIAGNOSIS — Z433 Encounter for attention to colostomy: Secondary | ICD-10-CM | POA: Diagnosis not present

## 2014-11-05 DIAGNOSIS — I482 Chronic atrial fibrillation: Secondary | ICD-10-CM | POA: Diagnosis not present

## 2014-11-05 DIAGNOSIS — Z433 Encounter for attention to colostomy: Secondary | ICD-10-CM | POA: Diagnosis not present

## 2014-11-05 DIAGNOSIS — E43 Unspecified severe protein-calorie malnutrition: Secondary | ICD-10-CM | POA: Diagnosis not present

## 2014-11-05 DIAGNOSIS — M6281 Muscle weakness (generalized): Secondary | ICD-10-CM | POA: Diagnosis not present

## 2014-11-07 DIAGNOSIS — M6281 Muscle weakness (generalized): Secondary | ICD-10-CM | POA: Diagnosis not present

## 2014-11-07 DIAGNOSIS — Z433 Encounter for attention to colostomy: Secondary | ICD-10-CM | POA: Diagnosis not present

## 2014-11-07 DIAGNOSIS — I482 Chronic atrial fibrillation: Secondary | ICD-10-CM | POA: Diagnosis not present

## 2014-11-07 DIAGNOSIS — E43 Unspecified severe protein-calorie malnutrition: Secondary | ICD-10-CM | POA: Diagnosis not present

## 2014-11-08 DIAGNOSIS — E43 Unspecified severe protein-calorie malnutrition: Secondary | ICD-10-CM | POA: Diagnosis not present

## 2014-11-08 DIAGNOSIS — I482 Chronic atrial fibrillation: Secondary | ICD-10-CM | POA: Diagnosis not present

## 2014-11-08 DIAGNOSIS — Z433 Encounter for attention to colostomy: Secondary | ICD-10-CM | POA: Diagnosis not present

## 2014-11-08 DIAGNOSIS — M6281 Muscle weakness (generalized): Secondary | ICD-10-CM | POA: Diagnosis not present

## 2014-11-08 NOTE — Op Note (Signed)
PATIENT NAME:  Jason Harmon, Jason Harmon MR#:  [id number] DATE OF BIRTH:  1940/12/14  DATE OF PROCEDURE:  07/27/2013  PREOPERATIVE DIAGNOSES:  1.  Acute renal failure.  2.  Ischemic bowel.  3.  Respiratory failure.   POSTOPERATIVE DIAGNOSES: 1.  Acute renal failure.  2.  Ischemic bowel.  3.  Respiratory failure.   PROCEDURE PERFORMED: Insertion of right IJ temporary dialysis catheter with ultrasound guidance.   SURGEON: Katha Cabal, MD  DESCRIPTION OF PROCEDURE: The patient is in the Intensive Care Unit. He is critically ill. He is intubated and sedated. He is positioned supine. Right neck is prepped and draped in sterile fashion after being shaved. Ultrasound is placed in a sterile sleeve. Jugular vein is echolucent and pulsatile, indicating patency. Image is recorded. Lidocaine 1% is infiltrated in the soft tissues, and access to the vein is obtained with a Seldinger needle under direct visualization. J-wire is advanced, followed by counterincision. Dilator is passed over the wire, and the double-lumen temporary dialysis catheter is advanced without difficulty. Both lumens aspirate and flush easily and are packed with heparin, and the catheter is secured to the skin of the neck with 2-0 nylon. Sterile dressing is applied. The patient tolerated the procedure well, and there were no immediate complications.   ____________________________ Katha Cabal, MD ggs:jcm D: 07/27/2013 14:11:59 ET T: 07/27/2013 17:08:23 ET JOB#: [id number]  cc: Katha Cabal, MD, <Dictator> Katha Cabal MD ELECTRONICALLY SIGNED 08/01/2013 9:50

## 2014-11-08 NOTE — Consult Note (Signed)
See dictated note.  Pt with small bowel obst, renal failure, GI bleeding  with marroon from rectum and dark from NG.  Most obvious would be obst from neoplasm, colonic or small bowel.  If colonic likely proximal.  Ischemia with stricture, internal hernia, foreign body ingestion, Crohn's all possible but less likely.  His family said his sister died of "some kind of stomach cancer".  Even though about 2000cc has come out since NG inserted he still has tight palpable loops in his abd.   His wife and daughter were interviewed.  I also told them he was extremely sick and likely would not survive this hospitalization.   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  Electronic Signatures: Manya Silvas (MD)  (Signed on 09-Jan-15 20:53)  Authored  Last Updated: 09-Jan-15 20:53 by Manya Silvas (MD)

## 2014-11-08 NOTE — Consult Note (Signed)
Brief Consult Note: Diagnosis: SBO.   Patient was seen by consultant.   Consult note dictated.   Recommend further assessment or treatment.   Orders entered.   Discussed with Attending MD.   Comments: SBO? no prior surgery. Very cachectic male who has not seen a doctor. No abd pain. vomiting three days. nonsmoker, nondrinker. Etiol? will need GI assistance discussed with ED MD and PD.  Electronic Signatures: Florene Glen (MD)  (Signed 09-Jan-15 15:31)  Authored: Brief Consult Note   Last Updated: 09-Jan-15 15:31 by Florene Glen (MD)

## 2014-11-08 NOTE — Consult Note (Signed)
PATIENT NAME:  Jason Harmon, Jason Harmon MR#:  [id number] DATE OF BIRTH:  [birth year]  DATE OF CONSULTATION:  07/26/2013  REFERRING PHYSICIAN:   CONSULTING PHYSICIAN:  Manya Silvas, MD  HISTORY OF PRESENT ILLNESS: The patient is a 74 year old white male who is currently on a ventilator. His history was obtained from the records and from his wife and daughter. He has never been in the hospital for illness before. He does not see doctors as a patient. He has never had a colonoscopy or upper endoscopy before or any kind of surgery. 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 He has developed since then increasing distention of his abdomen and increasing weakness and was seen in the ER with an obvious small bowel obstruction on CT, as well as severe dehydration and renal failure. He was admitted to the hospital. An NG tube was inserted in the ER, producing 1600 to 1700 mL of dark heme-positive fluid. After he was admitted to the hospital, he was felt to be unable to protect his airway from regurgitation of gastric contents and was electively intubated. I was asked to see him in consultation.   The patient's family says that the patient's sister died of some kind of stomach cancer within the last year.   CURRENT MEDICATIONS: None.   ALLERGIES: None.   PAST SURGICAL HISTORY: None.   FAMILY HISTORY: No cancer in his parents. Again, his sister did have some kind of abdominal cancer, possibly stomach cancer, possibly other abdominal cancer.   PHYSICAL EXAMINATION:  GENERAL: Elderly white male on a vent. The patient is on Levophed drip.  VITAL SIGNS: Blood pressure 94/62, pulse 124.  EYES: Sclerae anicteric. Conjunctivae negative.   HEAD: Atraumatic.  NECK: Trachea is in the midline.  CHEST: Clear anterior fields.  HEART: No murmurs or gallops that I could hear.  ABDOMEN: Quite distended. There are palpable small bowel loops in his abdomen. I cannot hear any bowel  sounds.  GENITOURINARY: He has a Foley catheter in. There is no periumbilical hernia. No inguinal hernia. There is some reddish blood in the area between the scrotum and the anus.  RECTAL: Shows no prostatic problems. No mass but some older-looking reddish blood.   LABORATORY DATA: Shows glucose 119, BUN 62, creatinine 4.9, sodium 129, potassium 3.8, chloride 93, CO2 18. Lipase 63. Troponin is 0.15, and repeat is 0.20. Total protein 7.4, albumin 3.3, total bili 0.4, alk phos 74, SGOT 28, SGPT 24. CPK total 650, CPK-MB 9.2. White blood count 23.2, hemoglobin 14.3, hematocrit 43.4, platelet count 22. The Gastroccult showed positive with pH of 3. Blood gas: PH 7.3, pCO2 32, pO2 96, bicarb 15.7. Lactic acid elevated at 4.1. CAT scan shows a very dilated small bowel with a small terminal ileum reported. Flat plate shows very dilated loops of small bowel. Questionable small amount of gas in the right colon.   ASSESSMENT: Mechanical obstruction of the small bowel. Most likely cause would be neoplasm, either distal small bowel or colonic. The patient could have other causes of small bowel. These are all less likely, including ischemia, idiopathic adhesions, internal hernia, ingested foreign body. Crohn's disease is another possibility.   RECOMMENDATIONS: Will speak with surgeon. He has renal failure and severe hypotension and dehydration on admission. His chances for surviving surgery might be increased by fluid resuscitation and blood pressure management.   I expressed to his wife and daughter that he was extremely ill and that there was a  good chance that he would not survive this hospitalization because of his medical problems. Will follow with you.   ____________________________ Manya Silvas, MD rte:gb D: 07/26/2013 21:01:58 ET T: 07/26/2013 21:17:06 ET JOB#: [id number]  cc: Manya Silvas, MD, <Dictator> Mark A. Marina Gravel, MD Jerrol Banana. Burt Knack, MD Manya Silvas MD ELECTRONICALLY SIGNED  08/01/2013 17:39

## 2014-11-08 NOTE — Consult Note (Signed)
   Comments   Mr. Tuminello is a 74 yo man with no previous medical problems in reportedly good health until his presentation on 1/9 with 3 days of nausea and vomiting and found to have SBO on CT scan. He was intubated HD #1 and underwent exp lap and found to have strangulated internal hernia and is s/p repair and resection. Pt has had a complicated hospital course with ARF, sepsis, and VDRF. He has remained on ventiator (day #11) and CRRT without significant improvement.  discussed case with Dr Mortimer Fries and we are requested to hold on consult at this time. Please reconsult if needed.   Electronic Signatures: Borders, Kirt Boys (NP)  (Signed 19-Jan-15 09:38)  Authored: Palliative Care   Last Updated: 19-Jan-15 09:38 by Irean Hong (NP)

## 2014-11-08 NOTE — Consult Note (Signed)
I usually sign off after surgery, if needed for any reason please reconsult.  Electronic Signatures: Manya Silvas (MD)  (Signed on 10-Jan-15 12:17)  Authored  Last Updated: 10-Jan-15 12:17 by Manya Silvas (MD)

## 2014-11-08 NOTE — Discharge Summary (Signed)
PATIENT NAME:  Jason Harmon, Jason Harmon MR#:  [id number] DATE OF BIRTH:  1941/01/24  DATE OF ADMISSION:  07/26/2013 DATE OF DISCHARGE:  08/06/2013  ADMITTING PHYSICIAN: Drue Flirt, MD  DISCHARGING PHYSICIAN: Gladstone Lighter, M.D.   PRIMARY CARE PHYSICIAN:  None.   CONSULTATIONS IN THE HOSPITAL:  1.  Surgical consultation by Dr. Marina Gravel.  2.  GI consultation by Dr. Vira Agar.  3.  Pulmonary critical care consultation by Dr. Devona Konig and Dr. Mortimer Fries.  4.  Nephrology consultation by Dr. Holley Raring.  5.  Vascular consultation for dialysis catheter placement by Dr. Delana Meyer.   DISCHARGE DIAGNOSES: 1.  Acute respiratory failure, currently on vent, PEEP of 5 and FIO2 of 30%.  2.  Sepsis.  3.  Strangulated necrotic bowel status post resection on 07/27/2013.  4.  Elevated troponins, likely demand ischemia.  5.  Hypoglycemia while on steroids, currently on insulin drip.  6.  Acute renal failure on hemodialysis.  7.  Atrial fibrillation with rapid ventricular response now in normal sinus rhythm.  8.  Acute blood loss anemia status post 2 units packed RBC transfusion this admission.  9.  Transfusion related acute lung injury.   DISCHARGE HOME MEDICATIONS:  1.  Solu-Medrol 40 mg IV daily.  2.  Amiodarone 400 mg p.o. b.i.d. for 1 week and then amiodarone 200 mg daily.  3.  Regular insulin drip for hyperglycemia protocol while in CCU.  4.  Precedex drip for sedation.  5.  Propofol drip for sedation.  6.  Combivent Respimat 4 puffs inhaled q. 4 hours while on vent.  7.  Tube feeds Vital 1.2 at 30 mL per hour via nasogastric tube continuous and flush with water 25 mL per hour.    8.  Elemental iron 150 mg oral capsule via NG tube b.i.d.  9.  Chlorhexidine topical 5 mL q. 12 hours.  10.  Neosporin 1 application twice a day to open sores.  11.  Haldol 1 mg injectable q. 6 hours p.r.n. for agitation.  12.  Zofran 4 mg IV q. 4 hours for nausea and vomiting.   DISCHARGE OXYGEN: Currently on vent.   DISCHARGE  DIET: Tube feeds.    DISCHARGE ACTIVITY: Currently bedrest and physical therapy once extubated.     FOLLOWUP INSTRUCTIONS: The patient is being transferred to [facility], [place].   LABS AND IMAGING STUDIES PRIOR TO DISCHARGE: WBC 16.7, hemoglobin  7.0, hematocrit 21.7, platelet count 196.   Sodium 132, potassium 5.2, chloride 100, BUN 91 , creatinine 3.23, glucose 119 and calcium of 7.6. Magnesium is 2.2.  Sputum cultures growing heavy growth of gram-negative rods from 08/05/2013. Chest x-ray showing increasing bibasilar airspace disease. Air bronchograms are present concerning for bilateral pneumonia superimposed on atelectasis versus mild to moderate edema with cardiac enlargement compatible with congestive heart failure also noted.   Echocardiogram showing LV ejection fraction of 60% to 65%. Mild concentric LVH is present.   BRIEF HOSPITAL COURSE: For more details, please look at the history and physical dictated by Dr. Drue Flirt on 07/26/2013. In brief, Jason Harmon is a 74 year old Caucasian male with no significant past medical history, presents to the hospital secondary to nausea, vomiting, abdominal pain and noted to have small bowel obstruction. The patient was septic on admission. Started on antibiotics and surgical consult was requested.  1.  Sepsis secondary to inflamed necrotic bowel.  Had bowel resection done of the terminal ileum which was strangulated and necrotic by Dr. Marina Gravel on 07/27/2013, and postoperative course has been complicated as  the patient has remained on ventilator and has been difficult to be weaned. He also developed transfusion related acute lung injury post 2 units of packed RBC transfusion with ARDS and has required higher amounts of PEEP and higher FiO2. He was empirically on vancomycin and Zosyn antibiotics. Vancomycin was discontinued once the cultures were negative. Zosyn was given for a whole 7 day course. His last x-ray also still shows bilateral infiltrates with air  bronchograms and also edema. He is currently afebrile. White count is improving and so the antibiotics are not restarted at this time. His echo showed good ejection fraction though his troponins were elevated. That was attributed to demand ischemia at this time. He is postoperative day 10 today. Tube feeds were started 2 days ago at 30 mL per hour.  He was supplemented by TPN while in the hospital. He will need to be followed by a surgeon over there.  He still has staples for his abdominal incision.  He does have 4+ edema and abdominal distention present. 2.  Acute respiratory failure postoperatively, currently on vent. Pulmonary has been following the patient here and currently weaned to 30% FIO2 and 5 of PEEP and will need spontaneous breathing trials once he moves over to the LTAC.  He is on propofol and precedex sedation.  3.  Acute renal failure.  The patient went into acute renal failure with anuria. His creatinine started to get worse. He was on  CRRT, especially while on pressors and septic in the ICU and his CRRT was stopped just 2 days ago, and he is being started on hemodialysis. His first hemodialysis was on the day of discharge on 08/06/2013 he will be followed by Nephrology over there.  4.  Atrial fibrillation with rapid ventricular response. The patient did not have any history of AFib. He went into AFib while in the hospital when he was acutely sick and was placed on amiodarone drip.  Cardiology was seeing the patient and he converted into normal sinus rhythm, so his amiodarone drip is being changed over to p.o. amiodarone 400 mg p.o. b.i.d. for 1 week and then 200 mg p.o. daily.  5.  Acute blood loss anemia postsurgically.  He received 2 units of transfusion. Hemoglobin has remained stable around 7.  Today's hemoglobin is 7. He will need probably 1 unit of transfusion, but carefully. It has to been monitored and may be given with dialysis, especially since he developed a lung injury with the  prior transfusions. His prognosis is still guarded.  The family has been very supportive all the while.   CODE STATUS: FULL CODE.    [facility]    TIME SPENT ON DISCHARGE: 45 minutes.  ____________________________ Gladstone Lighter, MD rk:dp D: 08/06/2013 14:13:21 ET T: 08/06/2013 14:41:34 ET JOB#: [id number]  cc: Gladstone Lighter, MD, <Dictator> Gladstone Lighter MD ELECTRONICALLY SIGNED 08/15/2013 13:46

## 2014-11-08 NOTE — Consult Note (Signed)
PATIENT NAME:  Jason Harmon, Jason Harmon MR#:  [id number] DATE OF BIRTH:  1941/02/08  DATE OF CONSULTATION:  07/26/2013  CONSULTING PHYSICIAN:  Jerrol Banana. Burt Knack, MD  CHIEF COMPLAINT: Nausea and vomiting.   HISTORY OF PRESENT ILLNESS: This is a 74 year old male patient who has not seen a doctor in years if ever. He takes no medications, has never had surgery, and does not speak very much. He is able to speak, but his family answers questions for him, and he lies there quietly. He denies any abdominal pain but has been vomiting for 3 days. He has not had a bowel movement in 3 days. Thinks he has been passing gas, however. He has never had an episode like this before and has had no surgery. He has had some dark red-colored material from his NG tube this afternoon. Denies fevers or chills.   PAST MEDICAL HISTORY: None, but the patient does not see a physician. Has never had a colonoscopy.   PAST SURGICAL HISTORY: None.   ALLERGIES: None.   MEDICATIONS: None.   FAMILY HISTORY: Noncontributory.   SOCIAL HISTORY: The patient is an Financial trader. He does not smoke nor does he drink.   REVIEW OF SYSTEMS: A10-system review was performed and negative with the exception of that mentioned in the HPI.  PHYSICAL EXAMINATION:  GENERAL: A cachectic-appearing male patient, long hair, white beard, 165 pounds with a BMI of 23.  VITAL SIGNS: Temperature of 97.3. Pulse has been in the 110 range. Blood pressures are relatively hypotensive, between 75 and 101 over 66. Respirations 18. Pain scale of zero.  HEENT: Poor dentition. Dehydrated mucous membranes. Thin, muscle-wasted face.  NECK: No palpable neck nodes.  CHEST: Bilateral rhonchi.  CARDIAC: Regular rate and rhythm.  ABDOMEN: Distended, tympanitic, but nontender. No scars are noted. No hernias are noted.  EXTREMITIES: Without edema. There is muscle wasting.  INTEGUMENTARY: No jaundice.  NEUROLOGIC: Grossly intact.   LABORATORY VALUES: Troponin is 0.15.  Electrolytes show considerable derangements. BUN of 62, creatinine of 4.9, sodium of 129, potassium 3.8, chloride at 93, CO2 18, serum albumin 3.3, lipase of 63. White blood cell count is 23,000 with H and H of 14 and 43, and a platelet count of 221.   Abdominal films and noncontrast CT are personally reviewed. There appears to be signs of a bowel obstruction. No free air. The stomach is greatly dilated.   Initially in the Emergency Room, a nasogastric tube was placed and 1600 mL of dark blackish guaiac-positive liquid was removed, but he remained tympanitic and nontender but distended.   ASSESSMENT AND PLAN: Possible partial small bowel obstruction. The patient has had no surgeries, so the etiology of this is unclear, but the patient clinically looks very cachectic, wasted, and potentially could have a malignancy.   I discussed this with Dr. Jimmye Norman and [facility] who will be admitting the patient.   I suggested a GI consultation because I believe this patient likely has a GI malignancy. He needs aggressive fluid resuscitation for his prerenal azotemia with correction of his electrolytes and nausea control. A nasogastric tube will be helpful.   ____________________________ Jerrol Banana. Burt Knack, MD rec:np D: 07/26/2013 15:39:55 ET T: 07/26/2013 16:30:28 ET JOB#: [id number]  cc: Jerrol Banana. Burt Knack, MD, <Dictator> Florene Glen MD ELECTRONICALLY SIGNED 07/27/2013 8:23

## 2014-11-08 NOTE — Consult Note (Signed)
General Aspect Jason Harmon is a 74yo male w/ no known prior medical history who was admitted to [facility] 1/9 with SBO and resultant septic shock, VDRF, acute renal failure, ABL anemia from GIB and troponin elevation.   Present Illness He reported experiencing worsening nausea/vomiting prompting his presentation to the ED. He had not had a BM in 3 days. Abdomen was noted to be markedly distended on exam. CT showed SBO. NGT was placed with significant output. He developed acute respiratory failure req intubation, and septic shock req pressor support. He has also required CRRT for acute renal failure due to ATN. He did develop GIB s/p 2 units PRBCs. He ultimately underwent ex lap revealing an incarcerated and necrotic internal hernia, adhesions s/p small bowel resection. Remains on vent, antibiotics.   2D echo 1/10: EF 55-60%, borderline LVH, no effusion.   Respiratory status has declined today. A CXR was obtained revealing moderate bilateral airspace disease concerning for pulmonary edema. Per hospitalist, there is a concern for ARDS.   He remains critically ill on multiple pressors and sedated.  TnI today returned at 1.10. 0.12->0.20->0.28 earlier in the admission. Cardiology consulted for further recs.  Marland Kitchen PAST SURGICAL HISTORY:  None.   ALLERGIES:  NONE.   MEDICATIONS:  None.   SOCIAL HISTORY:  Does not smoke. Does not drink. No drugs.   FAMILY HISTORY:  There is no history of colon cancer or other malignancies according to his daughter-in-law.   Physical Exam:  GEN critically ill appearing, intubated   HEENT pale conjunctivae   NECK supple  No masses  trachea midline  no appreciable JVD   RESP ventilator-dependent respirations, + stridor, diminished breath sounds, scattered rales, rhonchi, no appreciable wheezes   CARD Tachycardic  Normal, S1, S2  No murmur   ABD distended  hypoactive BS   EXTR negative cyanosis/clubbing, negative edema   SKIN normal to palpation, tight to  palpation   NEURO negative rigidity, negative tremor   PSYCH sedated   Review of Systems:  Subjective/Chief Complaint unable to obtain due to sedation, intubated       Shingles:   Lab Results:  Routine Chem:  13-Jan-15 00:00   Glucose, Serum  139  BUN  25  Creatinine (comp)  1.62  Sodium, Serum 143  Potassium, Serum 4.1  Chloride, Serum  109  CO2, Serum 30  Calcium (Total), Serum  8.1  Anion Gap  4  Osmolality (calc) 292  eGFR (African American)  49  eGFR (Non-African American)  42 (eGFR values <29m/min/1.73 m2 may be an indication of chronic kidney disease (CKD). Calculated eGFR is useful in patients with stable renal function. The eGFR calculation will not be reliable in acutely ill patients when serum creatinine is changing rapidly. It is not useful in  patients on dialysis. The eGFR calculation may not be applicable to patients at the low and high extremes of body sizes, pregnant women, and vegetarians.)  Result Comment labs - This specimen was collected through an   - indwelling catheter or arterial line.  - A minimum of 57m of blood was wasted prior    - to collecting the sample.  Interpret  - results with caution.  Result(s) reported on 30 Jul 2013 at 12:22AM.    04:00   Glucose, Serum  164  BUN  25  Creatinine (comp)  1.59  Sodium, Serum 140  Potassium, Serum 4.1  Chloride, Serum 107  CO2, Serum  33  Calcium (Total), Serum 8.6  Anion Gap  0  Osmolality (calc) 287  eGFR (African American)  50  eGFR (Non-African American)  43 (eGFR values <39m/min/1.73 m2 may be an indication of chronic kidney disease (CKD). Calculated eGFR is useful in patients with stable renal function. The eGFR calculation will not be reliable in acutely ill patients when serum creatinine is changing rapidly. It is not useful in  patients on dialysis. The eGFR calculation may not be applicable to patients at the low and high extremes of body sizes, pregnant women, and  vegetarians.)  Result Comment CK-MB/TROPONIN - ADDED TO WRONG SAMPLE TIME DRAW  - TESTS REORDERED AND ADDED TO 0700 DRAW  Result(s) reported on 30 Jul 2013 at 07:37AM.  Magnesium, Serum 2.0 (1.8-2.4 THERAPEUTIC RANGE: 4-7 mg/dL TOXIC: > 10 mg/dL  -----------------------)  Phosphorus, Serum 2.6 (Result(s) reported on 30 Jul 2013 at 04:33AM.)    07:00   Glucose, Serum  166  BUN  25  Creatinine (comp)  1.57  Sodium, Serum 142  Potassium, Serum 4.6  Chloride, Serum  109  CO2, Serum 31  Calcium (Total), Serum  8.1  Anion Gap  2  Osmolality (calc) 291  eGFR (African American)  51  eGFR (Non-African American)  44 (eGFR values <6134mmin/1.73 m2 may be an indication of chronic kidney disease (CKD). Calculated eGFR is useful in patients with stable renal function. The eGFR calculation will not be reliable in acutely ill patients when serum creatinine is changing rapidly. It is not useful in  patients on dialysis. The eGFR calculation may not be applicable to patients at the low and high extremes of body sizes, pregnant women, and vegetarians.)  Result Comment TROPONIN - RESULTS VERIFIED BY REPEAT TESTING.  - CALLED RESULT TO BRITTANY RUDD AT  - [id number]/13/15-DAS  - READ-BACK PROCESS PERFORMED.  Result(s) reported on 30 Jul 2013 at 08[facility] Result Comment MET B - This specimen was collected through an   - indwelling catheter or arterial line.  - A minimum of 34m61mof blood was wasted prior    - to collecting the sample.  Interpret  - results with caution.  Result(s) reported on 30 Jul 2013 at 08:42AM.  Cardiac:  09-Jan-15 09:57   Troponin I  0.15 (0.00-0.05 0.05 ng/mL or less: NEGATIVE  Repeat testing in 3-6 hrs  if clinically indicated. >0.05 ng/mL: POTENTIAL  MYOCARDIAL INJURY. Repeat  testing in 3-6 hrs if  clinically indicated. NOTE: An increase or decrease  of 30% or more on serial  testing suggests a  clinically important change)    17:16   CPK-MB, Serum  9.2  (Result(s) reported on 26 Jul 2013 at 06:05PM.)  Troponin I  0.20 (0.00-0.05 0.05 ng/mL or less: NEGATIVE  Repeat testing in 3-6 hrs  if clinically indicated. >0.05 ng/mL: POTENTIAL  MYOCARDIAL INJURY. Repeat  testing in 3-6 hrs if  clinically indicated. NOTE: An increase or decrease  of 30% or more on serial  testing suggests a  clinically important change)  CK, Total  650    21:04   CPK-MB, Serum  12.6 (Result(s) reported on 26 Jul 2013 at 09:57PM.)  Troponin I  0.28 (0.00-0.05 0.05 ng/mL or less: NEGATIVE  Repeat testing in 3-6 hrs  if clinically indicated. >0.05 ng/mL: POTENTIAL  MYOCARDIAL INJURY. Repeat  testing in 3-6 hrs if  clinically indicated. NOTE: An increase or decrease  of 30% or more on serial  testing suggests a  clinically important change)  CK, Total  920  13-Jan-15 04:00   CPK-MB,  Serum - (Result(s) reported on 30 Jul 2013 at 07:37AM.)  Troponin I - (0.00-0.05 0.05 ng/mL or less: NEGATIVE  Repeat testing in 3-6 hrs  if clinically indicated. >0.05 ng/mL: POTENTIAL  MYOCARDIAL INJURY. Repeat  testing in 3-6 hrs if  clinically indicated. NOTE: An increase or decrease  of 30% or more on serial  testing suggests a  clinically important change)    07:00   CPK-MB, Serum 3.2 (Result(s) reported on 30 Jul 2013 at [facility].)  Troponin I  1.10 (0.00-0.05 0.05 ng/mL or less: NEGATIVE  Repeat testing in 3-6 hrs  if clinically indicated. >0.05 ng/mL: POTENTIAL  MYOCARDIAL INJURY. Repeat  testing in 3-6 hrs if  clinically indicated. NOTE: An increase or decrease  of 30% or more on serial  testing suggests a  clinically important change)  Routine Hem:  13-Jan-15 04:00   WBC (CBC)  20.0  RBC (CBC)  3.54  Hemoglobin (CBC)  10.7  Hematocrit (CBC)  31.5  Platelet Count (CBC)  91  MCV 89  MCH 30.2  MCHC 33.9  RDW  17.1  Neutrophil % 87.3  Lymphocyte % 5.2  Monocyte % 7.0  Eosinophil % 0.3  Basophil % 0.2  Neutrophil #  17.5  Lymphocyte # 1.0   Monocyte #  1.4  Eosinophil # 0.1  Basophil # 0.0 (Result(s) reported on 30 Jul 2013 at 04:28AM.)   EKG:  Interpretation NSR, subtle ST scooping V6, I, aVL, II, III, aVF   Radiology Results: XRay:    13-Jan-15 08:24, Chest Portable Single View  Chest Portable Single View   REASON FOR EXAM:    Possible pulomnary edema  COMMENTS:       PROCEDURE: DXR - DXR PORTABLE CHEST SINGLE VIEW  - Jul 30 2013  8:24AM     CLINICAL DATA:  Pulmonary edema    EXAM:  PORTABLE CHEST - 1 VIEW    COMPARISON:  07/27/2013    FINDINGS:  Endotracheal tube in good position. Gastric tube in the stomach.  Right jugular catheter tip at the cavoatrial junction unchanged. No  pneumothorax.    Progression of perihilar airspace disease bilaterally most  consistent with pulmonary edema. Minimal pleural effusion  bilaterally.     IMPRESSION:  Interval development of moderate bilateral airspace disease,  probable pulmonary edema.      Electronically Signed    By: Franchot Gallo M.D.    On: 07/30/2013 08:24     Verified By: Truett Perna, M.D.,  Cardiology:    10-Jan-15 08:22, Echo Doppler  Echo Doppler   REASON FOR EXAM:      COMMENTS:       PROCEDURE: [facility] - ECHO DOPPLER COMPLETE(TRANSTHOR)  - Jul 27 2013  8:22AM     RESULT: Echocardiogram Report    Patient Name:   Jason Harmon Date of Exam: 07/27/2013  Medical Rec #:  [id number]          Custom1:  Date of Birth:  10/19/1940      Height:       71.0 in  Patient Age:    14 years        Weight:       165.0 lb  Patient Gender: M               BSA:          1.94 m??    Indications: Elevated cez  Sonographer:    Arville Go RDCS  Referring Phys: Dustin Flock, H  Sonographer Comments: Echo performed with patient supine and on   artificial respirator, Technically very difficult and limited study due   to very poor echo windows, suboptimal apical window and no subcostal   window.    Summary:   1. Left ventricular ejection fraction,  by visual estimation, is 55 to   60%.   2. Normal global left ventricular systolic function. Unable to evaluate   wall motion.   3. Borderline concentric left ventricular hypertrophy.   4. No pericardial effusion.  2D AND M-MODE MEASUREMENTS (normal ranges within parentheses):  Left Ventricle:          Normal  IVSd (2D):      1.16 cm (0.7-1.1)  LVPWd (2D):     1.05 cm (0.7-1.1) Aorta/LA:                  Normal  LVIDd (2D):     3.01 cm (3.4-5.7) Aortic Root (2D): 3.10 cm (2.4-3.7)  LVIDs (2D):     2.11 cm           Left Atrium (2D): 3.20 cm (1.9-4.0)  LV FS (2D):     29.9 %   (>25%)  LV EF (2D):     58.7 %   (>50%)                                    Right Ventricle:                                    RVd (2D):  LV DIASTOLIC FUNCTION:  MV Peak E: 0.58 m/s Decel Time: 90 msec  MV Peak A: 0.81 m/s  E/A Ratio: 0.71  SPECTRAL DOPPLER ANALYSIS (where applicable):  Mitral Valve:  MV P1/2 Time: 26.10 msec  MV Area, PHT: 8.43 cm??  Pulmonic Valve:  PV Max Velocity: 0.57 m/s PV Max PG: 1.3 mmHg PV Mean PG:    PHYSICIAN INTERPRETATION:  Left Ventricle: The left ventricular internal cavity size was normal. LV   posterior wall thickness was normal. Borderline concentric left   ventricular hypertrophy. Global LV systolic function was normal. Left   ventricular ejection fraction, by visual estimation, is 55 to 60%.  Right Ventricle: The right ventricle was not well seen.  Left Atrium: The left atrium is normal in size and structure.  Right Atrium: The right atrium is normal in size.  Pericardium: There is no evidence of pericardial effusion.  Mitral Valve: The mitral valve is not well seen. Trace mitral valve     regurgitation is seen.  Tricuspid Valve: The tricuspid valve is not well seen. Trivial tricuspid   regurgitation is visualized.  Aortic Valve: The aortic valve was not well seen. No evidence of aortic   valve regurgitation is seen.  Pulmonic Valve: The pulmonic valve is not well  seen.  Aorta: The aorta is not well seen.  Venous: The inferior vena cava was not well visualized.    [id number] Kathlyn Sacramento MD  Electronically signed by [id number] Kathlyn Sacramento MD  Signature Date/Time: 07/27/2013/11:46:37 AM    *** Final ***  IMPRESSION: .        Verified By: Mertie Clause. Fletcher Anon, M.D., MD    No Known Allergies:   Vital Signs/Nurse's Notes: **Vital Signs.:   13-Jan-15 09:00  Vital Signs Type Routine  Temperature Source oral  Pulse  Pulse 106  Respirations Respirations 26  Systolic BP Systolic BP 030  Diastolic BP (mmHg) Diastolic BP (mmHg) 98  Mean BP 124  Pulse Ox % Pulse Ox % 91  Pulse Ox Activity Level  At rest  Oxygen Delivery Ventilator Assisted  Pulse Ox Heart Rate 106  Access Pressure -81  Venous Pressure 156  Effluent Pressure 192  Balance Chamber Pressure 222  Therapy Fluid Rate (L/hr) 2.5  Blood Flow Rate (ml/min) 350  Ultrafiltration Rate (mL/hr) 100    10:00  Vital Signs Type Routine  Temperature Source oral  Pulse Pulse 98  Respirations Respirations 19  Systolic BP Systolic BP 092  Diastolic BP (mmHg) Diastolic BP (mmHg) 67  Mean BP 84  Pulse Ox % Pulse Ox % 90  Oxygen Delivery Ventilator Assisted  Pulse Ox Heart Rate 100  Access Pressure -89  Venous Pressure 158  Effluent Pressure 203  Balance Chamber Pressure 213  Therapy Fluid Rate (L/hr) 2.5  Blood Flow Rate (ml/min) 350  Ultrafiltration Rate (mL/hr) 100  *Intake and Output.:   Shift 13-Jan-15 15:00  Grand Totals Intake:  343.5 Output:  289    Net:  54.5 24 Hr.:  54.5  Diprivan      In:  11.1  IV (Primary)      In:  30  IV (Secondary)      In:  30  IV (Secondary)      In:  216  IV (Secondary)      In:  56.4  Ultrafiltration ml     Out:  289  Length of Stay Totals Intake:  15740.5 Output:  4229    Net:  11511.5    Impression 74yo male w/ no known prior medical history who was admitted to [facility] 1/9 with SBO and resultant septic shock, VDRF, acute renal failure, ABL anemia  from GIB and troponin elevation.  1. VDRF Remains on vent -- Management per primary team/pulmonary  2. Septic shock From SBO. Remains pressor-dependent. -- Management per primary team  3. Acute renal failure Nephrology on board. Patient remains on CRRT. Improving.  -- Management per nephrology  4. ABL anemia From GIB due to ischemic bowel. H/H stable s/p 2 PRBC transfusions. -- Management per primary team  5. Elevated troponin Multifactorial due to sepsis, hypoxia on admission, anemia req transfusions, acute renal failure from SBO. Unknown PMHx. EKG shows subtle nonspecific ST changes. 2D echo showed preserved EF, no WMAs. The patient remains critically ill requiring ventilation, pressor support, CRRT, BS abx. Unclear that this represents primary ACS.  -- Avoid ASA w/ GIB -- Could start low-dose AVN blockers to reduce cardiac demand ifremains tachycardic, driven by underlying medical problems -- Continue to trend troponins, even with marked elevation, not a candidate for ischemic eval/management at this time  6. Bilateral airspace disease On CXR this AM. Concerning for pulmonary edema vs ARDS per hospitalist. High output HF/acute diastolic CHF certainly possible. Exam reveals no evidence of JVD or peripheral edema. Lungs sounds are coarse. EF preserved on echo.  -- Renal managing volume status via CRRT, remains on ventilator -- Consider limited repeat echo. BNP would likely be elevated w/ renal failure, underlying medical issues.  7. PAT/SVT Short runs on telemetry. Maintaining NSR for the most part.  -- Continue to monitor -- PRN AVN blockers for HR > 100 or SVT  The patient remains critically ill. Prognosis is guarded.   Electronic Signatures: Meriel Pica (PA-C)  (Signed 13-Jan-15 10:55)  Authored: General Aspect/Present Illness,  History and Physical Exam, Review of System, Home Medications, Labs, EKG , Radiology, Allergies, Vital Signs/Nurse's Notes,  Impression/Plan Ida Rogue (MD)  (Signed 13-Jan-15 13:59)  Authored: General Aspect/Present Illness, History and Physical Exam, Review of System, Past Medical History  Co-Signer: General Aspect/Present Illness, History and Physical Exam, Review of System, Home Medications, Labs, EKG , Radiology, Allergies, Vital Signs/Nurse's Notes, Impression/Plan   Last Updated: 13-Jan-15 13:59 by Ida Rogue (MD)

## 2014-11-08 NOTE — Op Note (Signed)
PATIENT NAME:  Jason Harmon, Jason Harmon MR#:  [id number] DATE OF BIRTH:  October 17, 1940  DATE OF PROCEDURE:  07/27/2013  PREOPERATIVE DIAGNOSIS:  Small bowel obstruction, sepsis, acute abdomen.   POSTOPERATIVE DIAGNOSIS:  Same with small bowel obstruction secondary to strangulated and necrotic small bowel secondary to adhesion  PROCEDURES PERFORMED:  Exploratory laparotomy, small bowel resection with primary anastomosis.   SURGEON:  Sherri Rad, M.D.  ASSISTANT:  Scrub tech.  ANESTHESIA:  General endotracheal.   ESTIMATED BLOOD LOSS:  50 mL.   DRAINS:  None.   LAP AND NEEDLE COUNT:  Correct x 2.   DESCRIPTION OF PROCEDURE:  With informed consent obtained from the patient's wife he is brought urgently to the Operating Room on the ventilatory circuit and on high-dose pressors.  He was positioned supine on the Operating Room table.  General anesthesia was administered.  Foley catheter and central line has already been placed.   The patient's abdomen was widely clipped of hair, prepped and draped utilizing ChloraPrep solution.  Timeout was observed.   A midline skin incision was fashioned with scapel.  The peritoneum was divided with a hemostat.  Upon entry was 1 cm injury to the small intestine.  This was controlled with several sutures of 3-0 silk.  There was significant bloody ascites.  The fascial incision was then lengthened the length of the skin incision.  There was a necrotic black foul-smelling small intestine.  This was in the left lower quadrant.  There was an adhesive band from the sigmoid colon up into the root of the mesentery which was divided with Metzenbaum scissors.  The bowel was able to be eviscerated at this point.  This appeared to be mid ileum.  The bowel was divided on either side of the neck, sharp demarcation of necrosis utilizing separate fires of the GIA 75 stapler.  The intervening mesentery was then divided utilizing the powered LDS stapler.  Larger vessels were divided between  clamps and ties of #0 Vicryl suture.  The specimen was handed off the field.  The remaining intestine appeared to be viable.  Exploration of the abdomen demonstrated normal-appearing liver.  There was omental adhesions to the anterior abdominal wall in the right upper quadrant.  These were taken down with electrocautery to view the right lobe of the liver.  The cecum appeared unremarkable.  The distal small bowel was decompressed and of normal caliber.  Proximal bowel was markedly dilated.  Palpation of the stomach demonstrated the nasogastric tube which was positioned by anesthesia and secured in the antrum.  Palpation of the remaining portion of the colon were unremarkable.  In the area of the sigmoid colon in which the adhesive band was found the bowel appeared unremarkable.  The pelvis was unremarkable.   The enterotomy created on opening the fascial incision was approximately 20 cm proximal to the proximal staple line.  The bowel was controlled with occluding bowel clamps.  The sutures were removed.  Defect was then closed utilizing a transversely oriented TX 60 stapler.  The staple line was imbricated with 3-0 seromuscular Lembert silk sutures.   A side-to-side functional end-to-end anastomosis was fashioned between the two limbs of the intestine by aligning the antimesenteric border utilizing 3-0 silk suture.  Corner of each staple line was excised with bowel clamp on the proximal aspect.  Each limb of the GIA 75 stapler was introduced.  Common channel enterotomy was fashioned.  The enterotomy at the end of the bowel was closed in transverse orientation utilizing  a TX 60 stapler.  Anti-traction stitches were placed with 3-0 silk.  Staple lines were then imbricated with 3-0 silk suture.  Mesenteric defect was closed with a running locking 3-0 chromic suture.   The abdomen was then irrigated with approximately 1 liter of warm normal saline.  Hemostasis was adequate on operative field.  The omentum was  placed into the pelvis overlying the anastomosis.   With change of Operating Room team, gown and glove and instruments, the fascia was reapproximated from the extremes of the wound utilizing running #1 Vicryl sutures.  Multiple interupted internal retention sutures were placed.  Subcutaneous tissues were irrigated.  Staples were used to reapproximate the skin edges.  Sterile dressing was applied.  The patient then returned to the Intensive Care Unit in critical condition by anesthesia services.      ____________________________ Jason How Marina Gravel, MD [name] D: 07/27/2013 01:54:51 ET T: 07/27/2013 03:36:37 ET JOB#: [id number]  cc: Elta Guadeloupe A. Marina Gravel, MD, <Dictator> Mekenna Finau A Willean Schurman MD ELECTRONICALLY SIGNED 07/27/2013 6:45

## 2014-11-08 NOTE — H&P (Signed)
PATIENT NAME:  Jason Harmon, BRANDS MR#:  [id number] DATE OF BIRTH:  1941-02-19  DATE OF ADMISSION:  07/26/2013  PRIMARY CARE PROVIDER:  None.   EMERGENCY DEPARTMENT REFERRING PHYSICIAN:  Dr. Jimmye Norman.   CHIEF COMPLAINT:  Nausea and vomiting, hypotension, acute renal failure, small bowel obstruction.   HISTORY OF PRESENT ILLNESS:  The patient is a 74 year old white male who states that he has no medical problems, who since Tuesday started throwing up and was nauseous. The family felt that he had some viral infection. But today he was so fatigued and could not get up and then he had an episode where he almost passed out. The patient was brought to the ED. He was noted to have a significantly distended abdomen, but was not complaining of abdominal distention. He has not had any bowel movements in 3 days. He underwent a CT of the abdomen and pelvis, which showed small bowel obstruction. He was noted to have trace ascites. He was also noticed to have some small trace pleural effusions. His laboratory evaluation also revealed him to be in acute renal failure. He also had a borderline troponin. He was also hypotensive in the ED and he continues to have intermittent hypotension requiring fluid boluses. The patient was seen by Dr. Burt Knack, who felt that he was very critically ill and has multiple medical issues, recommends [place] to admit the patient. The patient has an NG tube in place with dark-colored output. The patient denies any chest pain, shortness of breath. No palpitations. Denies any fevers or chills. No diarrhea. No weight loss. No weight gain. Has not been urinating much over the past few days.   PAST MEDICAL HISTORY:  None.   PAST SURGICAL HISTORY:  None.   ALLERGIES:  NONE.   MEDICATIONS:  None.   SOCIAL HISTORY:  Does not smoke. Does not drink. No drugs.   FAMILY HISTORY:  There is no history of colon cancer or other malignancies according to his daughter-in-law.   REVIEW OF  SYSTEMS: CONSTITUTIONAL:  Denies any fevers. Has some weakness, fatigueness, denies any pain. No weight loss. No weight gain.  EYES:  No blurred or double vision. No pain. No redness. No inflammation. No glaucoma. No cataracts.  ENT:  No tinnitus. No ear pain. No hearing loss. No seasonal or year-round allergies. No epistaxis. No nasal discharge. No snoring. No difficulty swallowing.  RESPIRATORY:  Denies any cough, wheezing, hemoptysis. No COPD, no TB.  CARDIOVASCULAR:  Denies any chest pain, orthopnea, edema or arrhythmia. No palpitations. No syncope.  GASTROINTESTINAL:  Had nausea, vomiting, denies abdominal pain. No hematemesis. No melena. No ulcer. No GERD, no IBS. No jaundice.  GENITOURINARY:  Denies any dysuria, hematuria, renal calculus or frequency.  ENDOCRINE:  Denies any polyuria, nocturia or thyroid problems.  HEMATOLOGIC AND LYMPHATIC:  Denies anemia, easy bruisability or bleeding.  SKIN:  No acne. No rash. No changes in mole, hair or skin.  MUSCULOSKELETAL:  Denies any pain in the neck, back or shoulder.  NEUROLOGIC:  No numbness. No CVA. No TIA. No seizures.  PSYCHIATRIC:  No anxiety. No insomnia. No ADD.   PHYSICAL EXAMINATION:  VITAL SIGNS:  Temperature 97.3, pulse 115, respirations 18, blood pressure was 75/52.  GENERAL:  The patient is a critically ill-appearing male. Very frail and thin.  HEENT:  Head atraumatic, normocephalic. Pupils equally round, reactive to light and accommodation. No conjunctival pallor. No scleral icterus. Nasal exam shows no drainage or ulceration.  OROPHARYNX:  Very dry.  NECK:  Supple  without any JVD.  CARDIOVASCULAR:  Tachycardic. No murmurs, rubs, clicks or gallops. PMI is not displaced.  LUNGS:  Bilateral rhonchi in both of lungs. No crackles. No wheezing.  ABDOMEN:  Distended, diminished bowel sounds. No guarding, no rebound. No hepatosplenomegaly.  EXTREMITIES:  No clubbing, cyanosis or edema.  SKIN:  Dry. No rash.  LYMPHATICS:  No lymph  nodes palpable.  VASCULAR:  Good DP, PT pulses.  PSYCHIATRIC:  Not anxious or depressed.   LABORATORY DATA:  CT of the abdomen shows there are distended small bowel loops with air-fluid levels consistent with small bowel obstruction, a small amount of pelvic ascites, normal appendix, layering debris and calcified gallstone within the gallbladder, no hydronephrosis or hydroureter, small bilateral pleural effusions.  ADMITTING LABS:  Glucose 119, BUN 62, creatinine 4.91, sodium 129, potassium 3.8, chloride 93, CO2 is 18. Anion gap of 18, lipase 63, total protein 7.4, albumin 3.3, bilirubin total 0.4, AST is 20, ALT is 24. Troponin 0.15, WBC 23.2, hemoglobin 14.3, platelet count 221.   ASSESSMENT AND PLAN:  The patient is a 74 year old white male with apparently no medical history, presents with nausea and vomiting. Noted to have acute renal failure, hypotension, small bowel obstruction, elevated troponin, possible GI bleed.  1.  Acute kidney injury, possibly due to acute tubular necrosis. At this time, we will give him aggressive intravenous fluids. Obtain a renal ultrasound. Nephrology consult. We will check urine for eosinophils and WBCs.  2.  Hypotension, possibly due to sepsis. We will place the patient on IV vancomycin and Zosyn.  Continue aggressive IV fluids. The patient may need pressors.  3.  Elevated troponin, possibly due to demand ischemia. We will check serial enzymes. Hold aspirin due to possible GI bleed.  4.  Small bowel obstruction. NG tube to be continued. Seen by Dr. Burt Knack. No plan for surgery as of yet. He recommends GI evaluation.  5.  Possible upper gastrointestinal bleed, Protonix drip, gastrointestinal evaluation. Follow hemoglobin every 6 hours, transfuse as needed.  6.  Leukocytosis, possibly due to sepsis related to his small bowel obstruction. We will treat him with IV Vanco, Zosyn. Follow blood cultures. Obtain urinalysis.  7.  Likely lactic acidosis with elevated anion  gap. I will check a lactic acid level.  8.  Miscellaneous. The patient will be on SCDs for deep vein thrombosis prophylaxis.   NOTE:  This is a critical care patient. The patient at very high risk of cardiopulmonary arrest. He will be monitored in the ICU. His blood pressure, hemoglobin, his cardiopulmonary status will be monitored very closely.   TIME SPENT:  Critical 55 minutes.  ____________________________ Lafonda Mosses Posey Pronto, MD shp:jm D: 07/26/2013 15:50:47 ET T: 07/26/2013 16:08:29 ET JOB#: [id number]  cc: Khristopher Kapaun H. Posey Pronto, MD, <Dictator> Alric Seton MD ELECTRONICALLY SIGNED 08/06/2013 13:26

## 2014-11-08 NOTE — Consult Note (Signed)
General Aspect acute renal failure   Present Illness The patient is a 74 year old male who since Tuesday started throwing up and was nauseous. The family felt that he had some viral infection. But today he was so fatigued and could not get up and then he had an episode where he almost passed out. The patient was brought to the ED. He was noted to have a significantly distended abdomen. He has not had any bowel movements in 3 days. He underwent a CT of the abdomen and pelvis, which showed small bowel obstruction. He was noted to have trace ascites. He was also noticed to have some small trace pleural effusions. His laboratory evaluation also revealed him to be in acute renal failure. He was also hypotensive in the ED. The patient had an NG tube placed with dark-colored output. The patient denies any chest pain, shortness of breath. No palpitations. Denies any fevers or chills. No diarrhea. No weight loss. No weight gain.  Ear;lier today he underwent exploratory laparotomy and small bowel resection.  His renal failure has worsened and he now needs dialysis.   PAST MEDICAL HISTORY:  None.   PAST SURGICAL HISTORY:  None.    No Known Allergies:   Case History:  Family History Non-Contributory   Social History negative Illicit drugs   Review of Systems:  ROS Pt not able to provide ROS   Physical Exam:  GEN well developed, well nourished, critically ill appearing   HEENT poor dentition, intubated   NECK No masses  trachea midline   RESP on ventilator   CARD regular rate  No LE edema  no JVD   ABD distended  incision present   GU foley catheter in place   EXTR negative cyanosis/clubbing, negative edema   SKIN No rashes, skin turgor poor   PSYCH sedated, intubated   Nursing/Ancillary Notes: **Vital Signs.:   10-Jan-15 12:00  Vital Signs Type Routine  Pulse Pulse 118  Respirations Respirations 23  Systolic BP Systolic BP 741  Diastolic BP (mmHg) Diastolic BP (mmHg) 60  Mean  BP 74  Pulse Ox % Pulse Ox % 99  Pulse Ox Activity Level  At rest  Oxygen Delivery Ventilator Assisted  Pulse Ox Heart Rate 118   Thyroid:  10-Jan-15 04:00   Thyroid Stimulating Hormone  5.17 (0.45-4.50 (International Unit)  ----------------------- Pregnant patients have  different reference  ranges for TSH:  - - - - - - - - - -  Pregnant, first trimetser:  0.36 - 2.50 uIU/mL)  LabObservation:  10-Jan-15 08:22   OBSERVATION Reason for Test  Cardiology:  10-Jan-15 08:22   Echo Doppler REASON FOR EXAM:     COMMENTS:     PROCEDURE: [facility] - ECHO DOPPLER COMPLETE(TRANSTHOR)  - Jul 27 2013  8:22AM   RESULT: Echocardiogram Report  Patient Name:   Jason Harmon Date of Exam: 07/27/2013 Medical Rec #:  [id number]          Custom1: Date of Birth:  24-Oct-1940      Height:       71.0 in Patient Age:    74 years        Weight:       165.0 lb Patient Gender: M               BSA:          1.94 m??  Indications: Elevated cez Sonographer:    Arville Go RDCS Referring Phys: Dustin Flock, H  Sonographer Comments: Echo performed  with patient supine and on  artificial respirator, Technically very difficult and limited study due  to very poor echo windows, suboptimal apical window and no subcostal  window.  Summary:  1. Left ventricular ejection fraction, by visual estimation, is 55 to  60%.  2. Normal global left ventricular systolic function. Unable to evaluate  wall motion.  3. Borderline concentric left ventricular hypertrophy.  4. No pericardial effusion. 2D AND M-MODE MEASUREMENTS (normal ranges within parentheses): Left Ventricle:          Normal IVSd (2D):      1.16 cm (0.7-1.1) LVPWd (2D):     1.05 cm (0.7-1.1) Aorta/LA:                  Normal LVIDd (2D):     3.01 cm (3.4-5.7) Aortic Root (2D): 3.10 cm (2.4-3.7) LVIDs (2D):     2.11 cm           Left Atrium (2D): 3.20 cm (1.9-4.0) LV FS (2D):     29.9 %   (>25%) LV EF (2D):     58.7 %   (>50%)                                    Right Ventricle:                                   RVd (2D): LV DIASTOLIC FUNCTION: MV Peak E: 0.58 m/s Decel Time: 90 msec MV Peak A: 0.81 m/s E/A Ratio: 0.71 SPECTRAL DOPPLER ANALYSIS (where applicable): Mitral Valve: MV P1/2 Time: 26.10 msec MV Area, PHT: 8.43 cm?? Pulmonic Valve: PV Max Velocity: 0.57 m/s PV Max PG: 1.3 mmHg PV Mean PG:  PHYSICIAN INTERPRETATION: Left Ventricle: The left ventricular internal cavity size was normal. LV  posterior wall thickness was normal. Borderline concentric left  ventricular hypertrophy. Global LV systolic function was normal. Left  ventricular ejection fraction, by visual estimation, is 55 to 60%. Right Ventricle: The right ventricle was not well seen. Left Atrium: The left atrium is normal in size and structure. Right Atrium: The right atrium is normal in size. Pericardium: There is no evidence of pericardial effusion. Mitral Valve: The mitral valve is not well seen. Trace mitral valve   regurgitation is seen. Tricuspid Valve: The tricuspid valve is not well seen. Trivial tricuspid  regurgitation is visualized. Aortic Valve: The aortic valve was not well seen. No evidence of aortic  valve regurgitation is seen. Pulmonic Valve: The pulmonic valve is not well seen. Aorta: The aorta is not well seen. Venous: The inferior vena cava was not well visualized.  [id number] Kathlyn Sacramento MD Electronically signed by [id number] Kathlyn Sacramento MD Signature Date/Time: 07/27/2013/11:46:37 AM  *** Final *** IMPRESSION: .    Verified By: Mertie Clause. ARIDA, M.D., MD  Routine Chem:  10-Jan-15 02:00   Result Comment LABS - This specimen was collected through an   - indwelling catheter or arterial line.  - A minimum of 69ms of blood was wasted prior    - to collecting the sample.  Interpret  - results with caution.  Result(s) reported on 27 Jul 2013 at 02:20AM.    04:00   Result Comment LABS - This specimen was collected through an   -  indwelling catheter or arterial line.  - A minimum of 533m of blood was  wasted prior    - to collecting the sample.  Interpret  - results with caution.  Result(s) reported on 27 Jul 2013 at 04:11AM.  Result Comment CALCIUM - RESULTS VERIFIED BY REPEAT TESTING.  - C/MELISSA YORK/0500/07-27-13/RWW  - NOTIFIED OF CRITICAL VALUE  - READ-BACK PROCESS PERFORMED.  Result(s) reported on 27 Jul 2013 at 04:11AM.  Glucose, Serum  124  BUN  74  Creatinine (comp)  5.75  Sodium, Serum  130  Potassium, Serum 4.4  Chloride, Serum 101  CO2, Serum  19  Calcium (Total), Serum  6.5  Anion Gap 10  Osmolality (calc) 284  eGFR (African American)  11  eGFR (Non-African American)  9 (eGFR values <14m/min/1.73 m2 may be an indication of chronic kidney disease (CKD). Calculated eGFR is useful in patients with stable renal function. The eGFR calculation will not be reliable in acutely ill patients when serum creatinine is changing rapidly. It is not useful in  patients on dialysis. The eGFR calculation may not be applicable to patients at the low and high extremes of body sizes, pregnant women, and vegetarians.)  Cholesterol, Serum 54  Triglycerides, Serum 137  HDL (INHOUSE)  13  VLDL Cholesterol Calculated 27  LDL Cholesterol Calculated 14 (Result(s) reported on 27 Jul 2013 at 07:28AM.)  Routine Hem:  10-Jan-15 02:00   Hemoglobin (CBC)  10.5    04:00   Hemoglobin (CBC)  10.2  WBC (CBC) 6.5  RBC (CBC)  3.12  Hematocrit (CBC)  29.9  Platelet Count (CBC) 153  MCV 96  MCH 32.5  MCHC 33.9  RDW 13.5  Neutrophil % 58.5  Lymphocyte % 30.1  Monocyte % 11.1  Eosinophil % 0.2  Basophil % 0.1  Neutrophil # 3.8  Lymphocyte # 1.9  Monocyte # 0.7  Eosinophil # 0.0  Basophil # 0.0    09:25   Hemoglobin (CBC)  9.3 (Result(s) reported on 27 Jul 2013 at 09:45AM.)    Impression 1.  Acute kidney injury, possibly due to acute tubular necrosis.  Will place temp cath. 2.  Hypotension, possibly due to  sepsis. We will place the patient on IV vancomycin and Zosyn.  Continue aggressive IV fluids. The patient may need pressors.  3.  Elevated troponin, possibly due to demand ischemia. We will check serial enzymes. Hold aspirin due to possible GI bleed.  4.  Small bowel obstruction. s/p Ex Lap  5.  Possible upper gastrointestinal bleed, Protonix drip, gastrointestinal evaluation. Follow hemoglobin every 6 hours, transfuse as needed.  6.  Leukocytosis, possibly due to sepsis related to his small bowel obstruction. We will treat him with IV Vanco, Zosyn. Follow blood cultures. Obtain urinalysis.  7.  Likely lactic acidosis with elevated anion gap. I will check a lactic acid level.   Plan level 3   Electronic Signatures: SHortencia Pilar(MD)  (Signed 10-Jan-15 13:53)  Authored: General Aspect/Present Illness, Allergies, History and Physical Exam, Vital Signs, Labs, Impression/Plan   Last Updated: 10-Jan-15 13:53 by SHortencia Pilar(MD)

## 2014-11-08 NOTE — Op Note (Signed)
PATIENT NAME:  Jason Harmon, Jason Harmon MR#:  [id number] DATE OF BIRTH:  12/11/1940  DATE OF PROCEDURE:  07/26/2013  PREOPERATIVE DIAGNOSIS: Hypotension.   POSTOPERATIVE DIAGNOSIS: Hypotension.   PROCEDURE: Right femoral vein central line placement.   SURGEON: Richard E. Burt Knack, M.D.   ANESTHESIA: Local anesthetic.   INDICATIONS: This is a patient I had seen in the Emergency Room for possible small bowel obstruction who is acidemic and requires central line for pressors. His family and I discussed the rationale for the placement of the line and the location in the femoral vein due to his risk of aspiration if placed in Trendelenburg. This was reviewed for him and his family, but he is not very coherent. They understood and agreed to proceed.   FINDINGS: Central line in the right femoral vein.   DESCRIPTION OF PROCEDURE: The patient was identified in the ICU. Cap, gown, mask and gloves were worn. He was prepped and draped in a sterile fashion. Local anesthetic was infiltrated in the skin and subcutaneous tissues around the palpable right femoral pulse, and on the first attempt, a large-bore needle was placed in the right femoral vein. Seldinger wire was placed. An incision was made. Dilator was placed. The Seldinger wire was removed after placing the triple-lumen catheter without difficulty. All 3 lumens were aspirated and flushed and saline locked and then sutured to the skin of the anterior thigh, and a sterile dressing was placed.   The patient tolerated the procedure well. There were no complications. He was left in the ICU in critical condition.   ____________________________ Jerrol Banana Burt Knack, MD rec:gb D: 07/26/2013 18:48:07 ET T: 07/26/2013 22:43:58 ET JOB#: [id number]  cc: Jerrol Banana. Burt Knack, MD, <Dictator> Florene Glen MD ELECTRONICALLY SIGNED 07/27/2013 8:23

## 2014-11-09 DIAGNOSIS — M6281 Muscle weakness (generalized): Secondary | ICD-10-CM | POA: Diagnosis not present

## 2014-11-09 DIAGNOSIS — I482 Chronic atrial fibrillation: Secondary | ICD-10-CM | POA: Diagnosis not present

## 2014-11-09 DIAGNOSIS — E43 Unspecified severe protein-calorie malnutrition: Secondary | ICD-10-CM | POA: Diagnosis not present

## 2014-11-09 DIAGNOSIS — Z433 Encounter for attention to colostomy: Secondary | ICD-10-CM | POA: Diagnosis not present

## 2014-11-10 DIAGNOSIS — I482 Chronic atrial fibrillation: Secondary | ICD-10-CM | POA: Diagnosis not present

## 2014-11-10 DIAGNOSIS — Z433 Encounter for attention to colostomy: Secondary | ICD-10-CM | POA: Diagnosis not present

## 2014-11-10 DIAGNOSIS — M6281 Muscle weakness (generalized): Secondary | ICD-10-CM | POA: Diagnosis not present

## 2014-11-10 DIAGNOSIS — E43 Unspecified severe protein-calorie malnutrition: Secondary | ICD-10-CM | POA: Diagnosis not present

## 2014-11-12 DIAGNOSIS — E43 Unspecified severe protein-calorie malnutrition: Secondary | ICD-10-CM | POA: Diagnosis not present

## 2014-11-12 DIAGNOSIS — I482 Chronic atrial fibrillation: Secondary | ICD-10-CM | POA: Diagnosis not present

## 2014-11-12 DIAGNOSIS — Z433 Encounter for attention to colostomy: Secondary | ICD-10-CM | POA: Diagnosis not present

## 2014-11-12 DIAGNOSIS — M6281 Muscle weakness (generalized): Secondary | ICD-10-CM | POA: Diagnosis not present

## 2014-11-14 DIAGNOSIS — E43 Unspecified severe protein-calorie malnutrition: Secondary | ICD-10-CM | POA: Diagnosis not present

## 2014-11-14 DIAGNOSIS — Z433 Encounter for attention to colostomy: Secondary | ICD-10-CM | POA: Diagnosis not present

## 2014-11-14 DIAGNOSIS — M6281 Muscle weakness (generalized): Secondary | ICD-10-CM | POA: Diagnosis not present

## 2014-11-14 DIAGNOSIS — I482 Chronic atrial fibrillation: Secondary | ICD-10-CM | POA: Diagnosis not present

## 2014-11-16 DIAGNOSIS — E43 Unspecified severe protein-calorie malnutrition: Secondary | ICD-10-CM | POA: Diagnosis not present

## 2014-11-16 DIAGNOSIS — I482 Chronic atrial fibrillation: Secondary | ICD-10-CM | POA: Diagnosis not present

## 2014-11-16 DIAGNOSIS — M6281 Muscle weakness (generalized): Secondary | ICD-10-CM | POA: Diagnosis not present

## 2014-11-16 DIAGNOSIS — Z433 Encounter for attention to colostomy: Secondary | ICD-10-CM | POA: Diagnosis not present

## 2014-12-17 DIAGNOSIS — E43 Unspecified severe protein-calorie malnutrition: Secondary | ICD-10-CM | POA: Diagnosis not present

## 2014-12-17 DIAGNOSIS — Z433 Encounter for attention to colostomy: Secondary | ICD-10-CM | POA: Diagnosis not present

## 2014-12-17 DIAGNOSIS — L89892 Pressure ulcer of other site, stage 2: Secondary | ICD-10-CM | POA: Diagnosis not present

## 2014-12-17 DIAGNOSIS — M6281 Muscle weakness (generalized): Secondary | ICD-10-CM | POA: Diagnosis not present

## 2014-12-17 DIAGNOSIS — I482 Chronic atrial fibrillation: Secondary | ICD-10-CM | POA: Diagnosis not present

## 2014-12-18 DIAGNOSIS — M6281 Muscle weakness (generalized): Secondary | ICD-10-CM | POA: Diagnosis not present

## 2014-12-18 DIAGNOSIS — E43 Unspecified severe protein-calorie malnutrition: Secondary | ICD-10-CM | POA: Diagnosis not present

## 2014-12-18 DIAGNOSIS — L89892 Pressure ulcer of other site, stage 2: Secondary | ICD-10-CM | POA: Diagnosis not present

## 2014-12-18 DIAGNOSIS — I482 Chronic atrial fibrillation: Secondary | ICD-10-CM | POA: Diagnosis not present

## 2014-12-18 DIAGNOSIS — Z433 Encounter for attention to colostomy: Secondary | ICD-10-CM | POA: Diagnosis not present

## 2014-12-19 DIAGNOSIS — L89892 Pressure ulcer of other site, stage 2: Secondary | ICD-10-CM | POA: Diagnosis not present

## 2014-12-19 DIAGNOSIS — E43 Unspecified severe protein-calorie malnutrition: Secondary | ICD-10-CM | POA: Diagnosis not present

## 2014-12-19 DIAGNOSIS — Z433 Encounter for attention to colostomy: Secondary | ICD-10-CM | POA: Diagnosis not present

## 2014-12-19 DIAGNOSIS — M6281 Muscle weakness (generalized): Secondary | ICD-10-CM | POA: Diagnosis not present

## 2014-12-19 DIAGNOSIS — I482 Chronic atrial fibrillation: Secondary | ICD-10-CM | POA: Diagnosis not present

## 2014-12-20 DIAGNOSIS — L89892 Pressure ulcer of other site, stage 2: Secondary | ICD-10-CM | POA: Diagnosis not present

## 2014-12-20 DIAGNOSIS — M6281 Muscle weakness (generalized): Secondary | ICD-10-CM | POA: Diagnosis not present

## 2014-12-20 DIAGNOSIS — I482 Chronic atrial fibrillation: Secondary | ICD-10-CM | POA: Diagnosis not present

## 2014-12-20 DIAGNOSIS — Z433 Encounter for attention to colostomy: Secondary | ICD-10-CM | POA: Diagnosis not present

## 2014-12-20 DIAGNOSIS — E43 Unspecified severe protein-calorie malnutrition: Secondary | ICD-10-CM | POA: Diagnosis not present

## 2014-12-22 DIAGNOSIS — E43 Unspecified severe protein-calorie malnutrition: Secondary | ICD-10-CM | POA: Diagnosis not present

## 2014-12-22 DIAGNOSIS — L89892 Pressure ulcer of other site, stage 2: Secondary | ICD-10-CM | POA: Diagnosis not present

## 2014-12-22 DIAGNOSIS — Z433 Encounter for attention to colostomy: Secondary | ICD-10-CM | POA: Diagnosis not present

## 2014-12-22 DIAGNOSIS — I482 Chronic atrial fibrillation: Secondary | ICD-10-CM | POA: Diagnosis not present

## 2014-12-22 DIAGNOSIS — M6281 Muscle weakness (generalized): Secondary | ICD-10-CM | POA: Diagnosis not present

## 2014-12-24 DIAGNOSIS — Z433 Encounter for attention to colostomy: Secondary | ICD-10-CM | POA: Diagnosis not present

## 2014-12-24 DIAGNOSIS — M6281 Muscle weakness (generalized): Secondary | ICD-10-CM | POA: Diagnosis not present

## 2014-12-24 DIAGNOSIS — E43 Unspecified severe protein-calorie malnutrition: Secondary | ICD-10-CM | POA: Diagnosis not present

## 2014-12-24 DIAGNOSIS — L89892 Pressure ulcer of other site, stage 2: Secondary | ICD-10-CM | POA: Diagnosis not present

## 2014-12-24 DIAGNOSIS — I482 Chronic atrial fibrillation: Secondary | ICD-10-CM | POA: Diagnosis not present

## 2014-12-25 DIAGNOSIS — L89892 Pressure ulcer of other site, stage 2: Secondary | ICD-10-CM | POA: Diagnosis not present

## 2014-12-25 DIAGNOSIS — E43 Unspecified severe protein-calorie malnutrition: Secondary | ICD-10-CM | POA: Diagnosis not present

## 2014-12-25 DIAGNOSIS — M6281 Muscle weakness (generalized): Secondary | ICD-10-CM | POA: Diagnosis not present

## 2014-12-25 DIAGNOSIS — Z433 Encounter for attention to colostomy: Secondary | ICD-10-CM | POA: Diagnosis not present

## 2014-12-25 DIAGNOSIS — I482 Chronic atrial fibrillation: Secondary | ICD-10-CM | POA: Diagnosis not present

## 2014-12-26 DIAGNOSIS — Z433 Encounter for attention to colostomy: Secondary | ICD-10-CM | POA: Diagnosis not present

## 2014-12-26 DIAGNOSIS — I482 Chronic atrial fibrillation: Secondary | ICD-10-CM | POA: Diagnosis not present

## 2014-12-26 DIAGNOSIS — M6281 Muscle weakness (generalized): Secondary | ICD-10-CM | POA: Diagnosis not present

## 2014-12-26 DIAGNOSIS — E43 Unspecified severe protein-calorie malnutrition: Secondary | ICD-10-CM | POA: Diagnosis not present

## 2014-12-26 DIAGNOSIS — L89892 Pressure ulcer of other site, stage 2: Secondary | ICD-10-CM | POA: Diagnosis not present

## 2014-12-29 DIAGNOSIS — L89892 Pressure ulcer of other site, stage 2: Secondary | ICD-10-CM | POA: Diagnosis not present

## 2014-12-29 DIAGNOSIS — M6281 Muscle weakness (generalized): Secondary | ICD-10-CM | POA: Diagnosis not present

## 2014-12-29 DIAGNOSIS — I482 Chronic atrial fibrillation: Secondary | ICD-10-CM | POA: Diagnosis not present

## 2014-12-29 DIAGNOSIS — Z433 Encounter for attention to colostomy: Secondary | ICD-10-CM | POA: Diagnosis not present

## 2014-12-29 DIAGNOSIS — E43 Unspecified severe protein-calorie malnutrition: Secondary | ICD-10-CM | POA: Diagnosis not present

## 2014-12-31 DIAGNOSIS — I482 Chronic atrial fibrillation: Secondary | ICD-10-CM | POA: Diagnosis not present

## 2014-12-31 DIAGNOSIS — M6281 Muscle weakness (generalized): Secondary | ICD-10-CM | POA: Diagnosis not present

## 2014-12-31 DIAGNOSIS — E43 Unspecified severe protein-calorie malnutrition: Secondary | ICD-10-CM | POA: Diagnosis not present

## 2014-12-31 DIAGNOSIS — Z433 Encounter for attention to colostomy: Secondary | ICD-10-CM | POA: Diagnosis not present

## 2014-12-31 DIAGNOSIS — L89892 Pressure ulcer of other site, stage 2: Secondary | ICD-10-CM | POA: Diagnosis not present

## 2015-01-01 DIAGNOSIS — I482 Chronic atrial fibrillation: Secondary | ICD-10-CM | POA: Diagnosis not present

## 2015-01-01 DIAGNOSIS — E43 Unspecified severe protein-calorie malnutrition: Secondary | ICD-10-CM | POA: Diagnosis not present

## 2015-01-01 DIAGNOSIS — Z433 Encounter for attention to colostomy: Secondary | ICD-10-CM | POA: Diagnosis not present

## 2015-01-01 DIAGNOSIS — M6281 Muscle weakness (generalized): Secondary | ICD-10-CM | POA: Diagnosis not present

## 2015-01-01 DIAGNOSIS — L89892 Pressure ulcer of other site, stage 2: Secondary | ICD-10-CM | POA: Diagnosis not present

## 2015-01-02 DIAGNOSIS — Z433 Encounter for attention to colostomy: Secondary | ICD-10-CM | POA: Diagnosis not present

## 2015-01-02 DIAGNOSIS — L89892 Pressure ulcer of other site, stage 2: Secondary | ICD-10-CM | POA: Diagnosis not present

## 2015-01-02 DIAGNOSIS — E43 Unspecified severe protein-calorie malnutrition: Secondary | ICD-10-CM | POA: Diagnosis not present

## 2015-01-02 DIAGNOSIS — M6281 Muscle weakness (generalized): Secondary | ICD-10-CM | POA: Diagnosis not present

## 2015-01-02 DIAGNOSIS — I482 Chronic atrial fibrillation: Secondary | ICD-10-CM | POA: Diagnosis not present

## 2015-01-05 DIAGNOSIS — I482 Chronic atrial fibrillation: Secondary | ICD-10-CM | POA: Diagnosis not present

## 2015-01-05 DIAGNOSIS — M6281 Muscle weakness (generalized): Secondary | ICD-10-CM | POA: Diagnosis not present

## 2015-01-05 DIAGNOSIS — Z433 Encounter for attention to colostomy: Secondary | ICD-10-CM | POA: Diagnosis not present

## 2015-01-05 DIAGNOSIS — L89892 Pressure ulcer of other site, stage 2: Secondary | ICD-10-CM | POA: Diagnosis not present

## 2015-01-05 DIAGNOSIS — E43 Unspecified severe protein-calorie malnutrition: Secondary | ICD-10-CM | POA: Diagnosis not present

## 2015-01-07 DIAGNOSIS — E43 Unspecified severe protein-calorie malnutrition: Secondary | ICD-10-CM | POA: Diagnosis not present

## 2015-01-07 DIAGNOSIS — Z433 Encounter for attention to colostomy: Secondary | ICD-10-CM | POA: Diagnosis not present

## 2015-01-07 DIAGNOSIS — L89892 Pressure ulcer of other site, stage 2: Secondary | ICD-10-CM | POA: Diagnosis not present

## 2015-01-07 DIAGNOSIS — I482 Chronic atrial fibrillation: Secondary | ICD-10-CM | POA: Diagnosis not present

## 2015-01-07 DIAGNOSIS — M6281 Muscle weakness (generalized): Secondary | ICD-10-CM | POA: Diagnosis not present

## 2015-01-09 DIAGNOSIS — Z433 Encounter for attention to colostomy: Secondary | ICD-10-CM | POA: Diagnosis not present

## 2015-01-09 DIAGNOSIS — E43 Unspecified severe protein-calorie malnutrition: Secondary | ICD-10-CM | POA: Diagnosis not present

## 2015-01-09 DIAGNOSIS — M6281 Muscle weakness (generalized): Secondary | ICD-10-CM | POA: Diagnosis not present

## 2015-01-09 DIAGNOSIS — L89892 Pressure ulcer of other site, stage 2: Secondary | ICD-10-CM | POA: Diagnosis not present

## 2015-01-09 DIAGNOSIS — I482 Chronic atrial fibrillation: Secondary | ICD-10-CM | POA: Diagnosis not present

## 2015-01-12 DIAGNOSIS — I482 Chronic atrial fibrillation: Secondary | ICD-10-CM | POA: Diagnosis not present

## 2015-01-12 DIAGNOSIS — M6281 Muscle weakness (generalized): Secondary | ICD-10-CM | POA: Diagnosis not present

## 2015-01-12 DIAGNOSIS — Z433 Encounter for attention to colostomy: Secondary | ICD-10-CM | POA: Diagnosis not present

## 2015-01-12 DIAGNOSIS — E43 Unspecified severe protein-calorie malnutrition: Secondary | ICD-10-CM | POA: Diagnosis not present

## 2015-01-12 DIAGNOSIS — L89892 Pressure ulcer of other site, stage 2: Secondary | ICD-10-CM | POA: Diagnosis not present

## 2015-01-14 DIAGNOSIS — M6281 Muscle weakness (generalized): Secondary | ICD-10-CM | POA: Diagnosis not present

## 2015-01-14 DIAGNOSIS — Z433 Encounter for attention to colostomy: Secondary | ICD-10-CM | POA: Diagnosis not present

## 2015-01-14 DIAGNOSIS — L89892 Pressure ulcer of other site, stage 2: Secondary | ICD-10-CM | POA: Diagnosis not present

## 2015-01-14 DIAGNOSIS — I482 Chronic atrial fibrillation: Secondary | ICD-10-CM | POA: Diagnosis not present

## 2015-01-14 DIAGNOSIS — E43 Unspecified severe protein-calorie malnutrition: Secondary | ICD-10-CM | POA: Diagnosis not present

## 2015-01-15 DIAGNOSIS — E43 Unspecified severe protein-calorie malnutrition: Secondary | ICD-10-CM | POA: Diagnosis not present

## 2015-01-15 DIAGNOSIS — Z433 Encounter for attention to colostomy: Secondary | ICD-10-CM | POA: Diagnosis not present

## 2015-01-15 DIAGNOSIS — I482 Chronic atrial fibrillation: Secondary | ICD-10-CM | POA: Diagnosis not present

## 2015-01-15 DIAGNOSIS — L89892 Pressure ulcer of other site, stage 2: Secondary | ICD-10-CM | POA: Diagnosis not present

## 2015-01-15 DIAGNOSIS — M6281 Muscle weakness (generalized): Secondary | ICD-10-CM | POA: Diagnosis not present

## 2015-01-16 DIAGNOSIS — E43 Unspecified severe protein-calorie malnutrition: Secondary | ICD-10-CM | POA: Diagnosis not present

## 2015-01-16 DIAGNOSIS — M6281 Muscle weakness (generalized): Secondary | ICD-10-CM | POA: Diagnosis not present

## 2015-01-16 DIAGNOSIS — Z433 Encounter for attention to colostomy: Secondary | ICD-10-CM | POA: Diagnosis not present

## 2015-01-16 DIAGNOSIS — L89892 Pressure ulcer of other site, stage 2: Secondary | ICD-10-CM | POA: Diagnosis not present

## 2015-01-16 DIAGNOSIS — I482 Chronic atrial fibrillation: Secondary | ICD-10-CM | POA: Diagnosis not present

## 2015-01-22 DIAGNOSIS — F0630 Mood disorder due to known physiological condition, unspecified: Secondary | ICD-10-CM | POA: Diagnosis not present

## 2015-01-22 DIAGNOSIS — E43 Unspecified severe protein-calorie malnutrition: Secondary | ICD-10-CM | POA: Diagnosis not present

## 2015-02-16 DIAGNOSIS — Z433 Encounter for attention to colostomy: Secondary | ICD-10-CM | POA: Diagnosis not present

## 2015-02-16 DIAGNOSIS — G311 Senile degeneration of brain, not elsewhere classified: Secondary | ICD-10-CM | POA: Diagnosis not present

## 2015-02-16 DIAGNOSIS — I482 Chronic atrial fibrillation: Secondary | ICD-10-CM | POA: Diagnosis not present

## 2015-02-16 DIAGNOSIS — M6281 Muscle weakness (generalized): Secondary | ICD-10-CM | POA: Diagnosis not present

## 2015-02-16 DIAGNOSIS — F0630 Mood disorder due to known physiological condition, unspecified: Secondary | ICD-10-CM | POA: Diagnosis not present

## 2015-02-16 DIAGNOSIS — L89892 Pressure ulcer of other site, stage 2: Secondary | ICD-10-CM | POA: Diagnosis not present

## 2015-02-16 DIAGNOSIS — E43 Unspecified severe protein-calorie malnutrition: Secondary | ICD-10-CM | POA: Diagnosis not present

## 2015-02-17 DIAGNOSIS — Z433 Encounter for attention to colostomy: Secondary | ICD-10-CM | POA: Diagnosis not present

## 2015-02-17 DIAGNOSIS — I482 Chronic atrial fibrillation: Secondary | ICD-10-CM | POA: Diagnosis not present

## 2015-02-17 DIAGNOSIS — M6281 Muscle weakness (generalized): Secondary | ICD-10-CM | POA: Diagnosis not present

## 2015-02-17 DIAGNOSIS — L89892 Pressure ulcer of other site, stage 2: Secondary | ICD-10-CM | POA: Diagnosis not present

## 2015-02-17 DIAGNOSIS — E43 Unspecified severe protein-calorie malnutrition: Secondary | ICD-10-CM | POA: Diagnosis not present

## 2015-02-17 DIAGNOSIS — G311 Senile degeneration of brain, not elsewhere classified: Secondary | ICD-10-CM | POA: Diagnosis not present

## 2015-02-18 DIAGNOSIS — I482 Chronic atrial fibrillation: Secondary | ICD-10-CM | POA: Diagnosis not present

## 2015-02-18 DIAGNOSIS — Z433 Encounter for attention to colostomy: Secondary | ICD-10-CM | POA: Diagnosis not present

## 2015-02-18 DIAGNOSIS — E43 Unspecified severe protein-calorie malnutrition: Secondary | ICD-10-CM | POA: Diagnosis not present

## 2015-02-18 DIAGNOSIS — L89892 Pressure ulcer of other site, stage 2: Secondary | ICD-10-CM | POA: Diagnosis not present

## 2015-02-18 DIAGNOSIS — M6281 Muscle weakness (generalized): Secondary | ICD-10-CM | POA: Diagnosis not present

## 2015-02-18 DIAGNOSIS — G311 Senile degeneration of brain, not elsewhere classified: Secondary | ICD-10-CM | POA: Diagnosis not present

## 2015-02-20 DIAGNOSIS — M6281 Muscle weakness (generalized): Secondary | ICD-10-CM | POA: Diagnosis not present

## 2015-02-20 DIAGNOSIS — G311 Senile degeneration of brain, not elsewhere classified: Secondary | ICD-10-CM | POA: Diagnosis not present

## 2015-02-20 DIAGNOSIS — I482 Chronic atrial fibrillation: Secondary | ICD-10-CM | POA: Diagnosis not present

## 2015-02-20 DIAGNOSIS — L89892 Pressure ulcer of other site, stage 2: Secondary | ICD-10-CM | POA: Diagnosis not present

## 2015-02-20 DIAGNOSIS — E43 Unspecified severe protein-calorie malnutrition: Secondary | ICD-10-CM | POA: Diagnosis not present

## 2015-02-20 DIAGNOSIS — Z433 Encounter for attention to colostomy: Secondary | ICD-10-CM | POA: Diagnosis not present

## 2015-02-23 DIAGNOSIS — Z433 Encounter for attention to colostomy: Secondary | ICD-10-CM | POA: Diagnosis not present

## 2015-02-23 DIAGNOSIS — G311 Senile degeneration of brain, not elsewhere classified: Secondary | ICD-10-CM | POA: Diagnosis not present

## 2015-02-23 DIAGNOSIS — M6281 Muscle weakness (generalized): Secondary | ICD-10-CM | POA: Diagnosis not present

## 2015-02-23 DIAGNOSIS — L89892 Pressure ulcer of other site, stage 2: Secondary | ICD-10-CM | POA: Diagnosis not present

## 2015-02-23 DIAGNOSIS — I482 Chronic atrial fibrillation: Secondary | ICD-10-CM | POA: Diagnosis not present

## 2015-02-23 DIAGNOSIS — E43 Unspecified severe protein-calorie malnutrition: Secondary | ICD-10-CM | POA: Diagnosis not present

## 2015-02-25 DIAGNOSIS — M6281 Muscle weakness (generalized): Secondary | ICD-10-CM | POA: Diagnosis not present

## 2015-02-25 DIAGNOSIS — I482 Chronic atrial fibrillation: Secondary | ICD-10-CM | POA: Diagnosis not present

## 2015-02-25 DIAGNOSIS — G311 Senile degeneration of brain, not elsewhere classified: Secondary | ICD-10-CM | POA: Diagnosis not present

## 2015-02-25 DIAGNOSIS — E43 Unspecified severe protein-calorie malnutrition: Secondary | ICD-10-CM | POA: Diagnosis not present

## 2015-02-25 DIAGNOSIS — Z433 Encounter for attention to colostomy: Secondary | ICD-10-CM | POA: Diagnosis not present

## 2015-02-25 DIAGNOSIS — L89892 Pressure ulcer of other site, stage 2: Secondary | ICD-10-CM | POA: Diagnosis not present

## 2015-02-26 DIAGNOSIS — M6281 Muscle weakness (generalized): Secondary | ICD-10-CM | POA: Diagnosis not present

## 2015-02-26 DIAGNOSIS — E43 Unspecified severe protein-calorie malnutrition: Secondary | ICD-10-CM | POA: Diagnosis not present

## 2015-02-26 DIAGNOSIS — G311 Senile degeneration of brain, not elsewhere classified: Secondary | ICD-10-CM | POA: Diagnosis not present

## 2015-02-26 DIAGNOSIS — Z433 Encounter for attention to colostomy: Secondary | ICD-10-CM | POA: Diagnosis not present

## 2015-02-26 DIAGNOSIS — L89892 Pressure ulcer of other site, stage 2: Secondary | ICD-10-CM | POA: Diagnosis not present

## 2015-02-26 DIAGNOSIS — I482 Chronic atrial fibrillation: Secondary | ICD-10-CM | POA: Diagnosis not present

## 2015-02-27 DIAGNOSIS — M6281 Muscle weakness (generalized): Secondary | ICD-10-CM | POA: Diagnosis not present

## 2015-02-27 DIAGNOSIS — Z433 Encounter for attention to colostomy: Secondary | ICD-10-CM | POA: Diagnosis not present

## 2015-02-27 DIAGNOSIS — I482 Chronic atrial fibrillation: Secondary | ICD-10-CM | POA: Diagnosis not present

## 2015-02-27 DIAGNOSIS — G311 Senile degeneration of brain, not elsewhere classified: Secondary | ICD-10-CM | POA: Diagnosis not present

## 2015-02-27 DIAGNOSIS — E43 Unspecified severe protein-calorie malnutrition: Secondary | ICD-10-CM | POA: Diagnosis not present

## 2015-02-27 DIAGNOSIS — L89892 Pressure ulcer of other site, stage 2: Secondary | ICD-10-CM | POA: Diagnosis not present

## 2015-03-02 DIAGNOSIS — E43 Unspecified severe protein-calorie malnutrition: Secondary | ICD-10-CM | POA: Diagnosis not present

## 2015-03-02 DIAGNOSIS — M6281 Muscle weakness (generalized): Secondary | ICD-10-CM | POA: Diagnosis not present

## 2015-03-02 DIAGNOSIS — Z433 Encounter for attention to colostomy: Secondary | ICD-10-CM | POA: Diagnosis not present

## 2015-03-02 DIAGNOSIS — L89892 Pressure ulcer of other site, stage 2: Secondary | ICD-10-CM | POA: Diagnosis not present

## 2015-03-02 DIAGNOSIS — I482 Chronic atrial fibrillation: Secondary | ICD-10-CM | POA: Diagnosis not present

## 2015-03-02 DIAGNOSIS — G311 Senile degeneration of brain, not elsewhere classified: Secondary | ICD-10-CM | POA: Diagnosis not present

## 2015-03-03 DIAGNOSIS — I482 Chronic atrial fibrillation: Secondary | ICD-10-CM | POA: Diagnosis not present

## 2015-03-03 DIAGNOSIS — L89892 Pressure ulcer of other site, stage 2: Secondary | ICD-10-CM | POA: Diagnosis not present

## 2015-03-03 DIAGNOSIS — M6281 Muscle weakness (generalized): Secondary | ICD-10-CM | POA: Diagnosis not present

## 2015-03-03 DIAGNOSIS — G311 Senile degeneration of brain, not elsewhere classified: Secondary | ICD-10-CM | POA: Diagnosis not present

## 2015-03-03 DIAGNOSIS — Z433 Encounter for attention to colostomy: Secondary | ICD-10-CM | POA: Diagnosis not present

## 2015-03-03 DIAGNOSIS — E43 Unspecified severe protein-calorie malnutrition: Secondary | ICD-10-CM | POA: Diagnosis not present

## 2015-03-04 DIAGNOSIS — G311 Senile degeneration of brain, not elsewhere classified: Secondary | ICD-10-CM | POA: Diagnosis not present

## 2015-03-04 DIAGNOSIS — Z433 Encounter for attention to colostomy: Secondary | ICD-10-CM | POA: Diagnosis not present

## 2015-03-04 DIAGNOSIS — L89892 Pressure ulcer of other site, stage 2: Secondary | ICD-10-CM | POA: Diagnosis not present

## 2015-03-04 DIAGNOSIS — E43 Unspecified severe protein-calorie malnutrition: Secondary | ICD-10-CM | POA: Diagnosis not present

## 2015-03-04 DIAGNOSIS — M6281 Muscle weakness (generalized): Secondary | ICD-10-CM | POA: Diagnosis not present

## 2015-03-04 DIAGNOSIS — I482 Chronic atrial fibrillation: Secondary | ICD-10-CM | POA: Diagnosis not present

## 2015-03-06 DIAGNOSIS — Z433 Encounter for attention to colostomy: Secondary | ICD-10-CM | POA: Diagnosis not present

## 2015-03-06 DIAGNOSIS — I482 Chronic atrial fibrillation: Secondary | ICD-10-CM | POA: Diagnosis not present

## 2015-03-06 DIAGNOSIS — G311 Senile degeneration of brain, not elsewhere classified: Secondary | ICD-10-CM | POA: Diagnosis not present

## 2015-03-06 DIAGNOSIS — L89892 Pressure ulcer of other site, stage 2: Secondary | ICD-10-CM | POA: Diagnosis not present

## 2015-03-06 DIAGNOSIS — M6281 Muscle weakness (generalized): Secondary | ICD-10-CM | POA: Diagnosis not present

## 2015-03-06 DIAGNOSIS — E43 Unspecified severe protein-calorie malnutrition: Secondary | ICD-10-CM | POA: Diagnosis not present

## 2015-03-09 DIAGNOSIS — Z433 Encounter for attention to colostomy: Secondary | ICD-10-CM | POA: Diagnosis not present

## 2015-03-09 DIAGNOSIS — I482 Chronic atrial fibrillation: Secondary | ICD-10-CM | POA: Diagnosis not present

## 2015-03-09 DIAGNOSIS — E43 Unspecified severe protein-calorie malnutrition: Secondary | ICD-10-CM | POA: Diagnosis not present

## 2015-03-09 DIAGNOSIS — G311 Senile degeneration of brain, not elsewhere classified: Secondary | ICD-10-CM | POA: Diagnosis not present

## 2015-03-09 DIAGNOSIS — M6281 Muscle weakness (generalized): Secondary | ICD-10-CM | POA: Diagnosis not present

## 2015-03-09 DIAGNOSIS — L89892 Pressure ulcer of other site, stage 2: Secondary | ICD-10-CM | POA: Diagnosis not present

## 2015-03-10 DIAGNOSIS — I482 Chronic atrial fibrillation: Secondary | ICD-10-CM | POA: Diagnosis not present

## 2015-03-10 DIAGNOSIS — E43 Unspecified severe protein-calorie malnutrition: Secondary | ICD-10-CM | POA: Diagnosis not present

## 2015-03-10 DIAGNOSIS — L89892 Pressure ulcer of other site, stage 2: Secondary | ICD-10-CM | POA: Diagnosis not present

## 2015-03-10 DIAGNOSIS — G311 Senile degeneration of brain, not elsewhere classified: Secondary | ICD-10-CM | POA: Diagnosis not present

## 2015-03-10 DIAGNOSIS — Z433 Encounter for attention to colostomy: Secondary | ICD-10-CM | POA: Diagnosis not present

## 2015-03-10 DIAGNOSIS — M6281 Muscle weakness (generalized): Secondary | ICD-10-CM | POA: Diagnosis not present

## 2015-03-11 DIAGNOSIS — Z433 Encounter for attention to colostomy: Secondary | ICD-10-CM | POA: Diagnosis not present

## 2015-03-11 DIAGNOSIS — E43 Unspecified severe protein-calorie malnutrition: Secondary | ICD-10-CM | POA: Diagnosis not present

## 2015-03-11 DIAGNOSIS — M6281 Muscle weakness (generalized): Secondary | ICD-10-CM | POA: Diagnosis not present

## 2015-03-11 DIAGNOSIS — I482 Chronic atrial fibrillation: Secondary | ICD-10-CM | POA: Diagnosis not present

## 2015-03-11 DIAGNOSIS — L89892 Pressure ulcer of other site, stage 2: Secondary | ICD-10-CM | POA: Diagnosis not present

## 2015-03-11 DIAGNOSIS — G311 Senile degeneration of brain, not elsewhere classified: Secondary | ICD-10-CM | POA: Diagnosis not present

## 2015-03-13 DIAGNOSIS — G311 Senile degeneration of brain, not elsewhere classified: Secondary | ICD-10-CM | POA: Diagnosis not present

## 2015-03-13 DIAGNOSIS — E43 Unspecified severe protein-calorie malnutrition: Secondary | ICD-10-CM | POA: Diagnosis not present

## 2015-03-13 DIAGNOSIS — Z433 Encounter for attention to colostomy: Secondary | ICD-10-CM | POA: Diagnosis not present

## 2015-03-13 DIAGNOSIS — M6281 Muscle weakness (generalized): Secondary | ICD-10-CM | POA: Diagnosis not present

## 2015-03-13 DIAGNOSIS — I482 Chronic atrial fibrillation: Secondary | ICD-10-CM | POA: Diagnosis not present

## 2015-03-13 DIAGNOSIS — L89892 Pressure ulcer of other site, stage 2: Secondary | ICD-10-CM | POA: Diagnosis not present

## 2015-03-16 DIAGNOSIS — I482 Chronic atrial fibrillation: Secondary | ICD-10-CM | POA: Diagnosis not present

## 2015-03-16 DIAGNOSIS — E43 Unspecified severe protein-calorie malnutrition: Secondary | ICD-10-CM | POA: Diagnosis not present

## 2015-03-16 DIAGNOSIS — Z433 Encounter for attention to colostomy: Secondary | ICD-10-CM | POA: Diagnosis not present

## 2015-03-16 DIAGNOSIS — M6281 Muscle weakness (generalized): Secondary | ICD-10-CM | POA: Diagnosis not present

## 2015-03-16 DIAGNOSIS — G311 Senile degeneration of brain, not elsewhere classified: Secondary | ICD-10-CM | POA: Diagnosis not present

## 2015-03-16 DIAGNOSIS — L89892 Pressure ulcer of other site, stage 2: Secondary | ICD-10-CM | POA: Diagnosis not present

## 2015-03-18 DIAGNOSIS — M6281 Muscle weakness (generalized): Secondary | ICD-10-CM | POA: Diagnosis not present

## 2015-03-18 DIAGNOSIS — E43 Unspecified severe protein-calorie malnutrition: Secondary | ICD-10-CM | POA: Diagnosis not present

## 2015-03-18 DIAGNOSIS — Z433 Encounter for attention to colostomy: Secondary | ICD-10-CM | POA: Diagnosis not present

## 2015-03-18 DIAGNOSIS — I482 Chronic atrial fibrillation: Secondary | ICD-10-CM | POA: Diagnosis not present

## 2015-03-18 DIAGNOSIS — L89892 Pressure ulcer of other site, stage 2: Secondary | ICD-10-CM | POA: Diagnosis not present

## 2015-03-18 DIAGNOSIS — G311 Senile degeneration of brain, not elsewhere classified: Secondary | ICD-10-CM | POA: Diagnosis not present

## 2015-03-19 DIAGNOSIS — F0630 Mood disorder due to known physiological condition, unspecified: Secondary | ICD-10-CM | POA: Diagnosis not present

## 2015-03-19 DIAGNOSIS — L89892 Pressure ulcer of other site, stage 2: Secondary | ICD-10-CM | POA: Diagnosis not present

## 2015-03-19 DIAGNOSIS — Z433 Encounter for attention to colostomy: Secondary | ICD-10-CM | POA: Diagnosis not present

## 2015-03-19 DIAGNOSIS — M6281 Muscle weakness (generalized): Secondary | ICD-10-CM | POA: Diagnosis not present

## 2015-03-19 DIAGNOSIS — I482 Chronic atrial fibrillation: Secondary | ICD-10-CM | POA: Diagnosis not present

## 2015-03-19 DIAGNOSIS — G311 Senile degeneration of brain, not elsewhere classified: Secondary | ICD-10-CM | POA: Diagnosis not present

## 2015-03-19 DIAGNOSIS — E43 Unspecified severe protein-calorie malnutrition: Secondary | ICD-10-CM | POA: Diagnosis not present

## 2015-04-18 DIAGNOSIS — I482 Chronic atrial fibrillation: Secondary | ICD-10-CM | POA: Diagnosis not present

## 2015-04-18 DIAGNOSIS — E43 Unspecified severe protein-calorie malnutrition: Secondary | ICD-10-CM | POA: Diagnosis not present

## 2015-04-18 DIAGNOSIS — F0630 Mood disorder due to known physiological condition, unspecified: Secondary | ICD-10-CM | POA: Diagnosis not present

## 2015-04-18 DIAGNOSIS — M6281 Muscle weakness (generalized): Secondary | ICD-10-CM | POA: Diagnosis not present

## 2015-04-18 DIAGNOSIS — G311 Senile degeneration of brain, not elsewhere classified: Secondary | ICD-10-CM | POA: Diagnosis not present

## 2015-04-18 DIAGNOSIS — L89892 Pressure ulcer of other site, stage 2: Secondary | ICD-10-CM | POA: Diagnosis not present

## 2015-04-18 DIAGNOSIS — Z433 Encounter for attention to colostomy: Secondary | ICD-10-CM | POA: Diagnosis not present

## 2015-04-20 DIAGNOSIS — G311 Senile degeneration of brain, not elsewhere classified: Secondary | ICD-10-CM | POA: Diagnosis not present

## 2015-04-20 DIAGNOSIS — E43 Unspecified severe protein-calorie malnutrition: Secondary | ICD-10-CM | POA: Diagnosis not present

## 2015-04-20 DIAGNOSIS — L89892 Pressure ulcer of other site, stage 2: Secondary | ICD-10-CM | POA: Diagnosis not present

## 2015-04-20 DIAGNOSIS — M6281 Muscle weakness (generalized): Secondary | ICD-10-CM | POA: Diagnosis not present

## 2015-04-20 DIAGNOSIS — F0630 Mood disorder due to known physiological condition, unspecified: Secondary | ICD-10-CM | POA: Diagnosis not present

## 2015-04-20 DIAGNOSIS — Z433 Encounter for attention to colostomy: Secondary | ICD-10-CM | POA: Diagnosis not present

## 2015-04-22 DIAGNOSIS — M6281 Muscle weakness (generalized): Secondary | ICD-10-CM | POA: Diagnosis not present

## 2015-04-22 DIAGNOSIS — Z433 Encounter for attention to colostomy: Secondary | ICD-10-CM | POA: Diagnosis not present

## 2015-04-22 DIAGNOSIS — G311 Senile degeneration of brain, not elsewhere classified: Secondary | ICD-10-CM | POA: Diagnosis not present

## 2015-04-22 DIAGNOSIS — L89892 Pressure ulcer of other site, stage 2: Secondary | ICD-10-CM | POA: Diagnosis not present

## 2015-04-22 DIAGNOSIS — E43 Unspecified severe protein-calorie malnutrition: Secondary | ICD-10-CM | POA: Diagnosis not present

## 2015-04-22 DIAGNOSIS — F0630 Mood disorder due to known physiological condition, unspecified: Secondary | ICD-10-CM | POA: Diagnosis not present

## 2015-04-23 DIAGNOSIS — E43 Unspecified severe protein-calorie malnutrition: Secondary | ICD-10-CM | POA: Diagnosis not present

## 2015-04-23 DIAGNOSIS — Z433 Encounter for attention to colostomy: Secondary | ICD-10-CM | POA: Diagnosis not present

## 2015-04-23 DIAGNOSIS — M6281 Muscle weakness (generalized): Secondary | ICD-10-CM | POA: Diagnosis not present

## 2015-04-23 DIAGNOSIS — G311 Senile degeneration of brain, not elsewhere classified: Secondary | ICD-10-CM | POA: Diagnosis not present

## 2015-04-23 DIAGNOSIS — F0630 Mood disorder due to known physiological condition, unspecified: Secondary | ICD-10-CM | POA: Diagnosis not present

## 2015-04-23 DIAGNOSIS — L89892 Pressure ulcer of other site, stage 2: Secondary | ICD-10-CM | POA: Diagnosis not present

## 2015-04-24 DIAGNOSIS — Z433 Encounter for attention to colostomy: Secondary | ICD-10-CM | POA: Diagnosis not present

## 2015-04-24 DIAGNOSIS — L89892 Pressure ulcer of other site, stage 2: Secondary | ICD-10-CM | POA: Diagnosis not present

## 2015-04-24 DIAGNOSIS — E43 Unspecified severe protein-calorie malnutrition: Secondary | ICD-10-CM | POA: Diagnosis not present

## 2015-04-24 DIAGNOSIS — F0630 Mood disorder due to known physiological condition, unspecified: Secondary | ICD-10-CM | POA: Diagnosis not present

## 2015-04-24 DIAGNOSIS — G311 Senile degeneration of brain, not elsewhere classified: Secondary | ICD-10-CM | POA: Diagnosis not present

## 2015-04-24 DIAGNOSIS — M6281 Muscle weakness (generalized): Secondary | ICD-10-CM | POA: Diagnosis not present

## 2015-04-27 DIAGNOSIS — M6281 Muscle weakness (generalized): Secondary | ICD-10-CM | POA: Diagnosis not present

## 2015-04-27 DIAGNOSIS — F0630 Mood disorder due to known physiological condition, unspecified: Secondary | ICD-10-CM | POA: Diagnosis not present

## 2015-04-27 DIAGNOSIS — G311 Senile degeneration of brain, not elsewhere classified: Secondary | ICD-10-CM | POA: Diagnosis not present

## 2015-04-27 DIAGNOSIS — E43 Unspecified severe protein-calorie malnutrition: Secondary | ICD-10-CM | POA: Diagnosis not present

## 2015-04-27 DIAGNOSIS — Z433 Encounter for attention to colostomy: Secondary | ICD-10-CM | POA: Diagnosis not present

## 2015-04-27 DIAGNOSIS — L89892 Pressure ulcer of other site, stage 2: Secondary | ICD-10-CM | POA: Diagnosis not present

## 2015-04-29 DIAGNOSIS — Z433 Encounter for attention to colostomy: Secondary | ICD-10-CM | POA: Diagnosis not present

## 2015-04-29 DIAGNOSIS — L89892 Pressure ulcer of other site, stage 2: Secondary | ICD-10-CM | POA: Diagnosis not present

## 2015-04-29 DIAGNOSIS — E43 Unspecified severe protein-calorie malnutrition: Secondary | ICD-10-CM | POA: Diagnosis not present

## 2015-04-29 DIAGNOSIS — F0630 Mood disorder due to known physiological condition, unspecified: Secondary | ICD-10-CM | POA: Diagnosis not present

## 2015-04-29 DIAGNOSIS — G311 Senile degeneration of brain, not elsewhere classified: Secondary | ICD-10-CM | POA: Diagnosis not present

## 2015-04-29 DIAGNOSIS — M6281 Muscle weakness (generalized): Secondary | ICD-10-CM | POA: Diagnosis not present

## 2015-05-01 DIAGNOSIS — Z433 Encounter for attention to colostomy: Secondary | ICD-10-CM | POA: Diagnosis not present

## 2015-05-01 DIAGNOSIS — G311 Senile degeneration of brain, not elsewhere classified: Secondary | ICD-10-CM | POA: Diagnosis not present

## 2015-05-01 DIAGNOSIS — M6281 Muscle weakness (generalized): Secondary | ICD-10-CM | POA: Diagnosis not present

## 2015-05-01 DIAGNOSIS — F0630 Mood disorder due to known physiological condition, unspecified: Secondary | ICD-10-CM | POA: Diagnosis not present

## 2015-05-01 DIAGNOSIS — L89892 Pressure ulcer of other site, stage 2: Secondary | ICD-10-CM | POA: Diagnosis not present

## 2015-05-01 DIAGNOSIS — E43 Unspecified severe protein-calorie malnutrition: Secondary | ICD-10-CM | POA: Diagnosis not present

## 2015-05-04 DIAGNOSIS — E43 Unspecified severe protein-calorie malnutrition: Secondary | ICD-10-CM | POA: Diagnosis not present

## 2015-05-04 DIAGNOSIS — L89892 Pressure ulcer of other site, stage 2: Secondary | ICD-10-CM | POA: Diagnosis not present

## 2015-05-04 DIAGNOSIS — M6281 Muscle weakness (generalized): Secondary | ICD-10-CM | POA: Diagnosis not present

## 2015-05-04 DIAGNOSIS — Z433 Encounter for attention to colostomy: Secondary | ICD-10-CM | POA: Diagnosis not present

## 2015-05-04 DIAGNOSIS — F0630 Mood disorder due to known physiological condition, unspecified: Secondary | ICD-10-CM | POA: Diagnosis not present

## 2015-05-04 DIAGNOSIS — G311 Senile degeneration of brain, not elsewhere classified: Secondary | ICD-10-CM | POA: Diagnosis not present

## 2015-05-06 DIAGNOSIS — L89892 Pressure ulcer of other site, stage 2: Secondary | ICD-10-CM | POA: Diagnosis not present

## 2015-05-06 DIAGNOSIS — F0630 Mood disorder due to known physiological condition, unspecified: Secondary | ICD-10-CM | POA: Diagnosis not present

## 2015-05-06 DIAGNOSIS — M6281 Muscle weakness (generalized): Secondary | ICD-10-CM | POA: Diagnosis not present

## 2015-05-06 DIAGNOSIS — G311 Senile degeneration of brain, not elsewhere classified: Secondary | ICD-10-CM | POA: Diagnosis not present

## 2015-05-06 DIAGNOSIS — E43 Unspecified severe protein-calorie malnutrition: Secondary | ICD-10-CM | POA: Diagnosis not present

## 2015-05-06 DIAGNOSIS — Z433 Encounter for attention to colostomy: Secondary | ICD-10-CM | POA: Diagnosis not present

## 2015-05-06 IMAGING — CT CT ABD-PELV W/O CM
2 of 4 series · 16 of 46 positions shown, 18 images · non-contrast
Comparison: Abdominal pelvic CT 07/26/2013.

CLINICAL DATA: Small bowel obstruction with partial small bowel
resection for ischemic bowel. Subsequent acute respiratory failure
and renal failure.

EXAM:
CT ABDOMEN AND PELVIS WITHOUT CONTRAST
TECHNIQUE: Multidetector CT imaging of the abdomen and pelvis was performed
following the standard protocol without intravenous contrast.

[Series 2: abd/ pelvis 5.0 i30f 1 · axial · 0.79mm/px · z∈[-478,-28]mm · 13 of 100 slices shown, 15 images]
[im 5/100  soft-tissue]
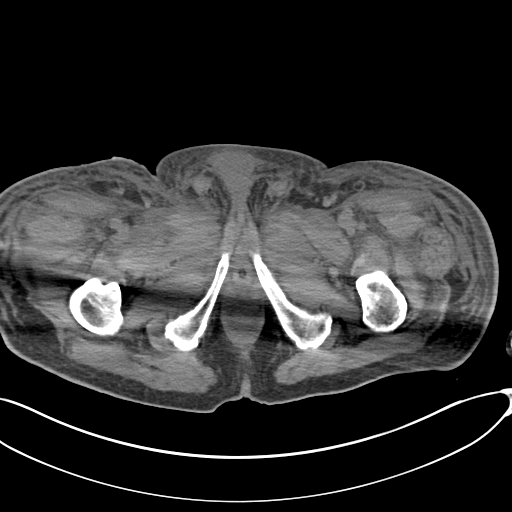
[im 5/100  bone]
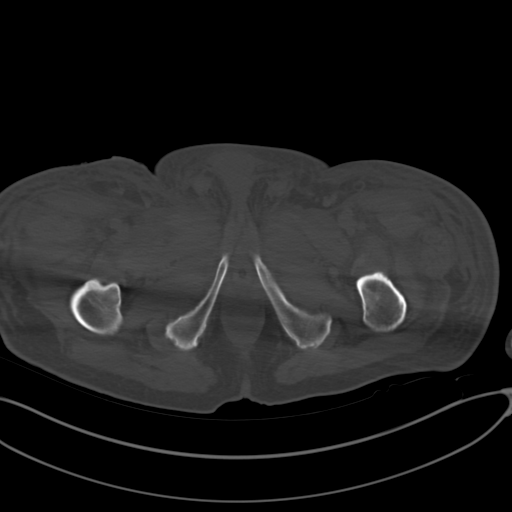
[im 13/100  soft-tissue]
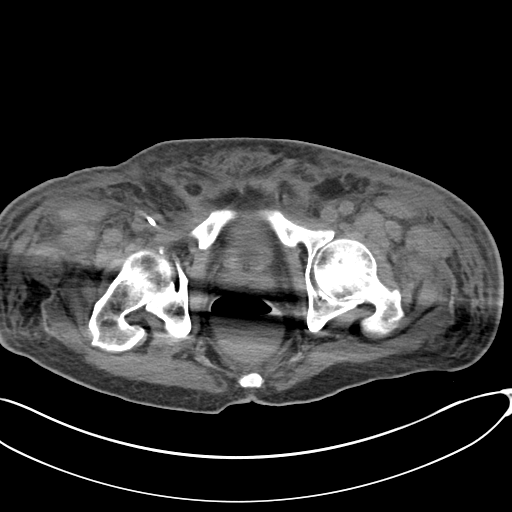
[im 21/100  soft-tissue]
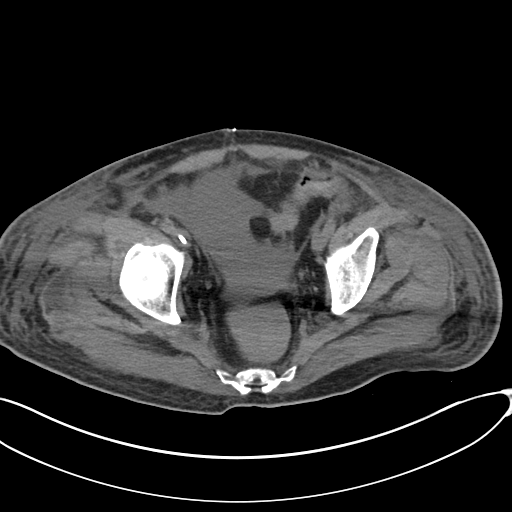
[im 29/100  soft-tissue]
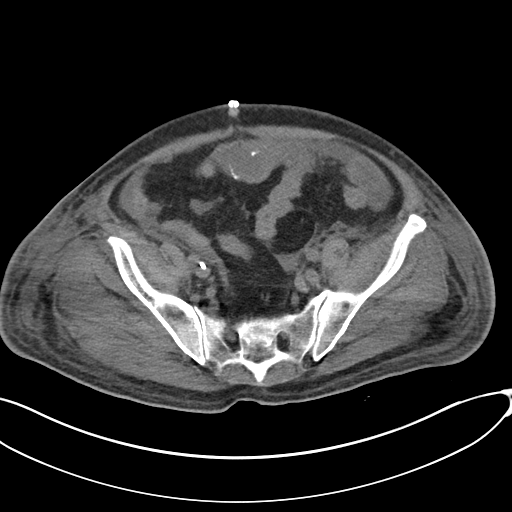
[im 34/100  soft-tissue]
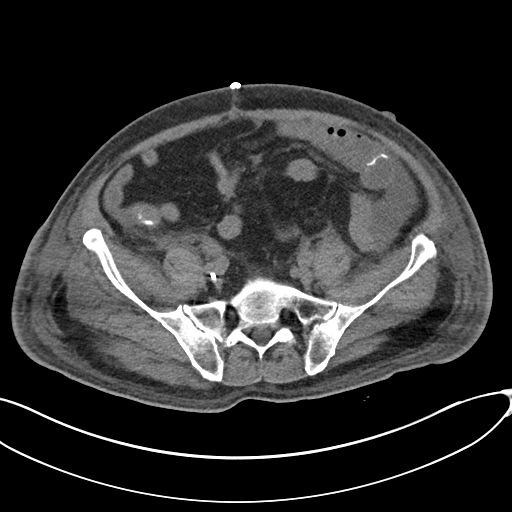
[im 42/100  soft-tissue]
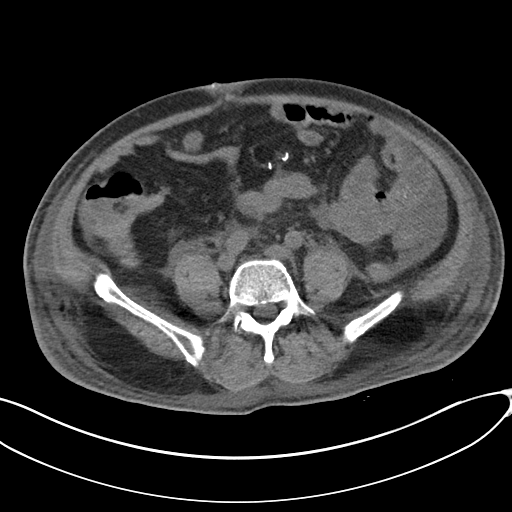
[im 50/100  soft-tissue]
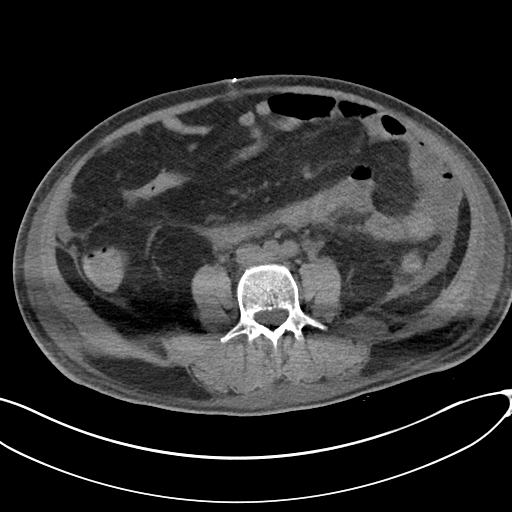
[im 58/100  soft-tissue]
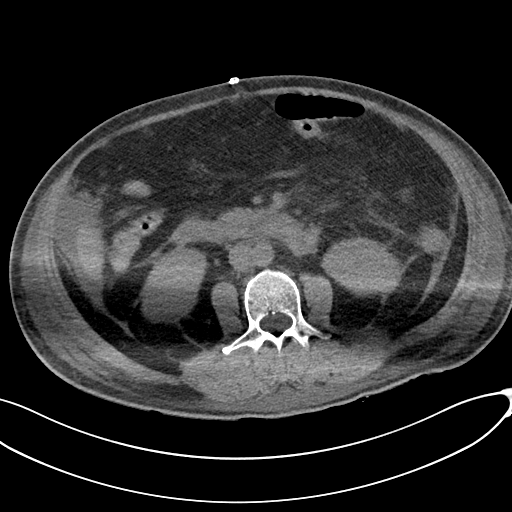
[im 67/100  soft-tissue]
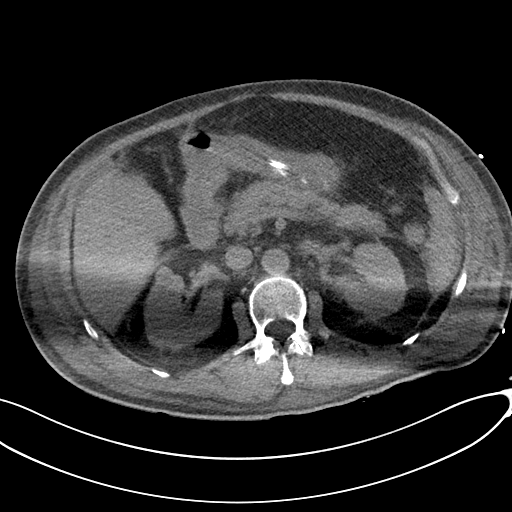
[im 67/100  bone]
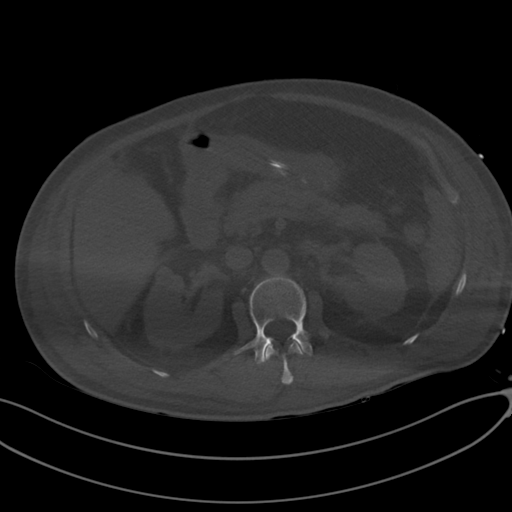
[im 71/100  soft-tissue]
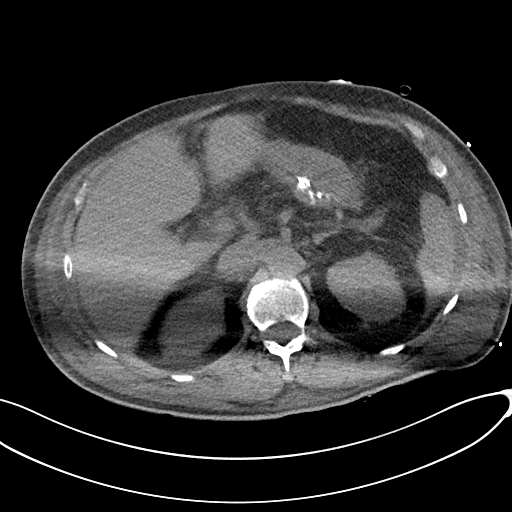
[im 79/100  soft-tissue]
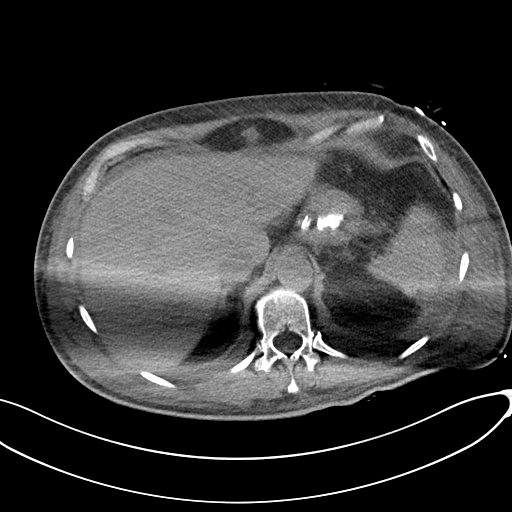
[im 87/100  soft-tissue]
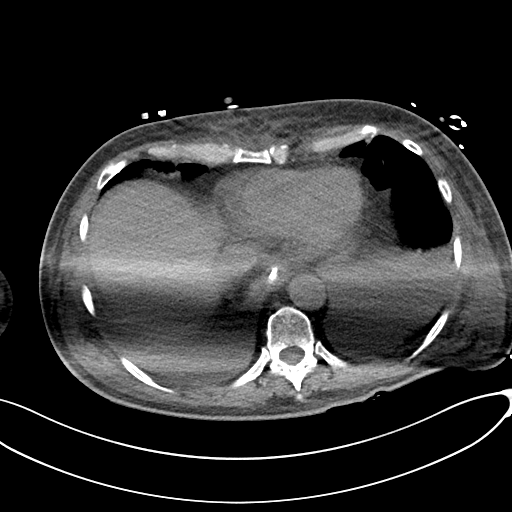
[im 95/100  soft-tissue]
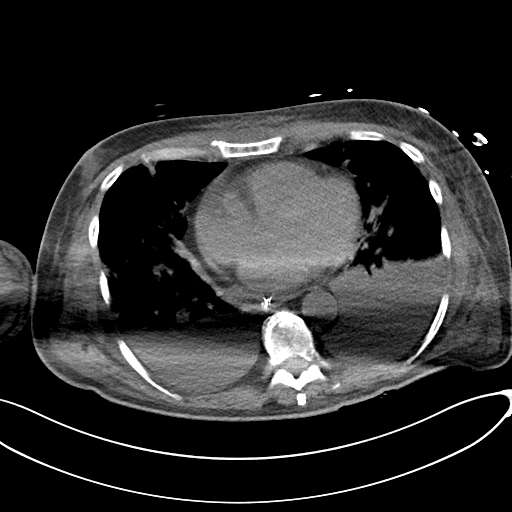

[Series 5: cor st · coronal · 0.88mm/px · 3 of 99 slices shown]
[im 33/99  soft-tissue]
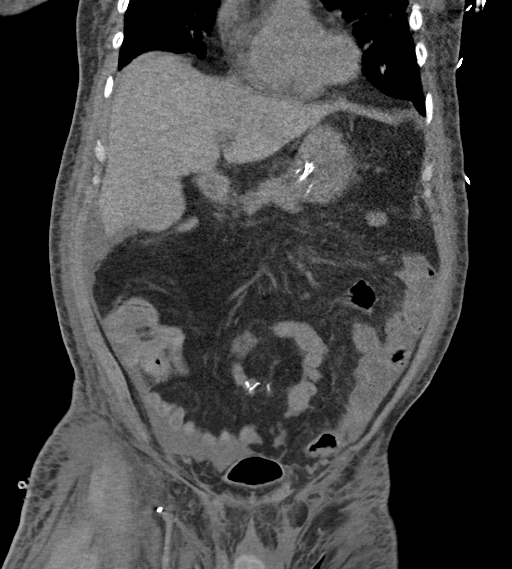
[im 44/99  soft-tissue]
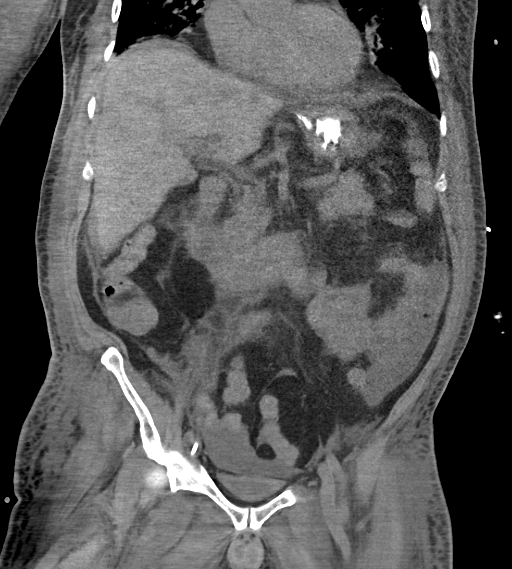
[im 55/99  soft-tissue]
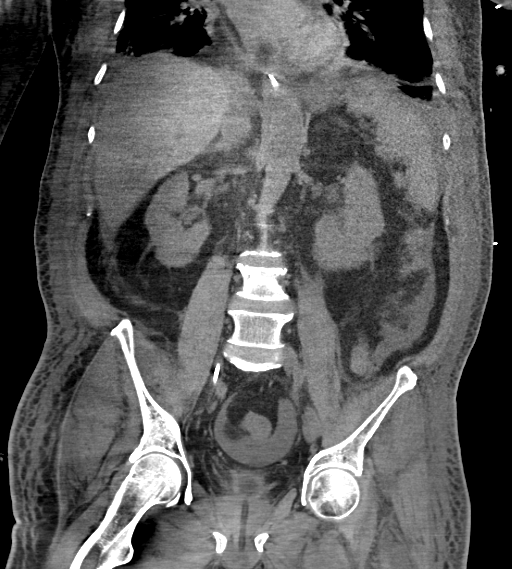

[16 of 46 positions shown; findings below may reference images not displayed]

FINDINGS: Examination is mildly motion degraded and degraded by the patient's
arms. Anasarca has developed with diffuse soft tissue edema. There
are enlarging bilateral pleural effusions with associated bibasilar
pulmonary atelectasis.

A nasogastric tube projects into the mid stomach. The stomach and
small bowel are decompressed. Only minimal enteric contrast was
administered. There is no extravasated contrast or focal
extraluminal fluid collection. A small amount of free fluid is
present within both pericolic gutters and in the pelvis. Anastomosis
clips are present within the left lower quadrant. There is no
evidence of bowel obstruction, pneumatosis or free intraperitoneal
air.

As evaluated in the noncontrast state, the liver, spleen, pancreas
and adrenal glands appear normal. There is high density within the
gallbladder lumen, likely vicarious excretion of contrast. There has
been mild interval enlargement of both kidneys, likely related to
the patient's acute renal failure. There is no hydronephrosis. Small
exophytic lesion projecting anteriorly from the mid right kidney is
grossly stable.

There is no significant atherosclerosis. The bladder is partially
decompressed by Foley catheter. Right femoral line extends into the
external iliac vein. There is no retroperitoneal hematoma.

There are no worrisome osseous findings.
IMPRESSION: 1. Postsurgical changes following partial small bowel resection and
anastomosis. No residual bowel obstruction, free intraperitoneal air
or focal extraluminal fluid collection.
2. Anasarca with diffuse soft tissue edema, pleural effusions and
ascites.
3. Interval enlargement of both kidneys attributed to acute renal
failure. No hydronephrosis.

## 2015-05-07 DIAGNOSIS — F0630 Mood disorder due to known physiological condition, unspecified: Secondary | ICD-10-CM | POA: Diagnosis not present

## 2015-05-07 DIAGNOSIS — M6281 Muscle weakness (generalized): Secondary | ICD-10-CM | POA: Diagnosis not present

## 2015-05-07 DIAGNOSIS — Z433 Encounter for attention to colostomy: Secondary | ICD-10-CM | POA: Diagnosis not present

## 2015-05-07 DIAGNOSIS — L89892 Pressure ulcer of other site, stage 2: Secondary | ICD-10-CM | POA: Diagnosis not present

## 2015-05-07 DIAGNOSIS — E43 Unspecified severe protein-calorie malnutrition: Secondary | ICD-10-CM | POA: Diagnosis not present

## 2015-05-07 DIAGNOSIS — G311 Senile degeneration of brain, not elsewhere classified: Secondary | ICD-10-CM | POA: Diagnosis not present

## 2015-05-08 DIAGNOSIS — G311 Senile degeneration of brain, not elsewhere classified: Secondary | ICD-10-CM | POA: Diagnosis not present

## 2015-05-08 DIAGNOSIS — Z433 Encounter for attention to colostomy: Secondary | ICD-10-CM | POA: Diagnosis not present

## 2015-05-08 DIAGNOSIS — L89892 Pressure ulcer of other site, stage 2: Secondary | ICD-10-CM | POA: Diagnosis not present

## 2015-05-08 DIAGNOSIS — E43 Unspecified severe protein-calorie malnutrition: Secondary | ICD-10-CM | POA: Diagnosis not present

## 2015-05-08 DIAGNOSIS — M6281 Muscle weakness (generalized): Secondary | ICD-10-CM | POA: Diagnosis not present

## 2015-05-08 DIAGNOSIS — F0630 Mood disorder due to known physiological condition, unspecified: Secondary | ICD-10-CM | POA: Diagnosis not present

## 2015-05-13 DIAGNOSIS — M6281 Muscle weakness (generalized): Secondary | ICD-10-CM | POA: Diagnosis not present

## 2015-05-13 DIAGNOSIS — F0630 Mood disorder due to known physiological condition, unspecified: Secondary | ICD-10-CM | POA: Diagnosis not present

## 2015-05-13 DIAGNOSIS — Z433 Encounter for attention to colostomy: Secondary | ICD-10-CM | POA: Diagnosis not present

## 2015-05-13 DIAGNOSIS — G311 Senile degeneration of brain, not elsewhere classified: Secondary | ICD-10-CM | POA: Diagnosis not present

## 2015-05-13 DIAGNOSIS — E43 Unspecified severe protein-calorie malnutrition: Secondary | ICD-10-CM | POA: Diagnosis not present

## 2015-05-13 DIAGNOSIS — L89892 Pressure ulcer of other site, stage 2: Secondary | ICD-10-CM | POA: Diagnosis not present

## 2015-05-15 DIAGNOSIS — G311 Senile degeneration of brain, not elsewhere classified: Secondary | ICD-10-CM | POA: Diagnosis not present

## 2015-05-15 DIAGNOSIS — L89892 Pressure ulcer of other site, stage 2: Secondary | ICD-10-CM | POA: Diagnosis not present

## 2015-05-15 DIAGNOSIS — M6281 Muscle weakness (generalized): Secondary | ICD-10-CM | POA: Diagnosis not present

## 2015-05-15 DIAGNOSIS — F0630 Mood disorder due to known physiological condition, unspecified: Secondary | ICD-10-CM | POA: Diagnosis not present

## 2015-05-15 DIAGNOSIS — Z433 Encounter for attention to colostomy: Secondary | ICD-10-CM | POA: Diagnosis not present

## 2015-05-15 DIAGNOSIS — E43 Unspecified severe protein-calorie malnutrition: Secondary | ICD-10-CM | POA: Diagnosis not present

## 2015-05-18 DIAGNOSIS — L89892 Pressure ulcer of other site, stage 2: Secondary | ICD-10-CM | POA: Diagnosis not present

## 2015-05-18 DIAGNOSIS — G311 Senile degeneration of brain, not elsewhere classified: Secondary | ICD-10-CM | POA: Diagnosis not present

## 2015-05-18 DIAGNOSIS — M6281 Muscle weakness (generalized): Secondary | ICD-10-CM | POA: Diagnosis not present

## 2015-05-18 DIAGNOSIS — Z433 Encounter for attention to colostomy: Secondary | ICD-10-CM | POA: Diagnosis not present

## 2015-05-18 DIAGNOSIS — E43 Unspecified severe protein-calorie malnutrition: Secondary | ICD-10-CM | POA: Diagnosis not present

## 2015-05-18 DIAGNOSIS — F0630 Mood disorder due to known physiological condition, unspecified: Secondary | ICD-10-CM | POA: Diagnosis not present

## 2015-05-19 DIAGNOSIS — G311 Senile degeneration of brain, not elsewhere classified: Secondary | ICD-10-CM | POA: Diagnosis not present

## 2015-05-19 DIAGNOSIS — M6281 Muscle weakness (generalized): Secondary | ICD-10-CM | POA: Diagnosis not present

## 2015-05-19 DIAGNOSIS — E43 Unspecified severe protein-calorie malnutrition: Secondary | ICD-10-CM | POA: Diagnosis not present

## 2015-05-19 DIAGNOSIS — F0630 Mood disorder due to known physiological condition, unspecified: Secondary | ICD-10-CM | POA: Diagnosis not present

## 2015-05-19 DIAGNOSIS — L89892 Pressure ulcer of other site, stage 2: Secondary | ICD-10-CM | POA: Diagnosis not present

## 2015-05-19 DIAGNOSIS — Z433 Encounter for attention to colostomy: Secondary | ICD-10-CM | POA: Diagnosis not present

## 2015-05-19 DIAGNOSIS — I482 Chronic atrial fibrillation: Secondary | ICD-10-CM | POA: Diagnosis not present

## 2015-06-18 DIAGNOSIS — E43 Unspecified severe protein-calorie malnutrition: Secondary | ICD-10-CM | POA: Diagnosis not present

## 2015-06-18 DIAGNOSIS — M6281 Muscle weakness (generalized): Secondary | ICD-10-CM | POA: Diagnosis not present

## 2015-06-18 DIAGNOSIS — Z433 Encounter for attention to colostomy: Secondary | ICD-10-CM | POA: Diagnosis not present

## 2015-06-18 DIAGNOSIS — I482 Chronic atrial fibrillation: Secondary | ICD-10-CM | POA: Diagnosis not present

## 2015-06-18 DIAGNOSIS — F0630 Mood disorder due to known physiological condition, unspecified: Secondary | ICD-10-CM | POA: Diagnosis not present

## 2015-06-18 DIAGNOSIS — L89892 Pressure ulcer of other site, stage 2: Secondary | ICD-10-CM | POA: Diagnosis not present

## 2015-06-18 DIAGNOSIS — G311 Senile degeneration of brain, not elsewhere classified: Secondary | ICD-10-CM | POA: Diagnosis not present

## 2015-06-19 DIAGNOSIS — L89892 Pressure ulcer of other site, stage 2: Secondary | ICD-10-CM | POA: Diagnosis not present

## 2015-06-19 DIAGNOSIS — E43 Unspecified severe protein-calorie malnutrition: Secondary | ICD-10-CM | POA: Diagnosis not present

## 2015-06-19 DIAGNOSIS — F0630 Mood disorder due to known physiological condition, unspecified: Secondary | ICD-10-CM | POA: Diagnosis not present

## 2015-06-19 DIAGNOSIS — M6281 Muscle weakness (generalized): Secondary | ICD-10-CM | POA: Diagnosis not present

## 2015-06-19 DIAGNOSIS — Z433 Encounter for attention to colostomy: Secondary | ICD-10-CM | POA: Diagnosis not present

## 2015-06-19 DIAGNOSIS — G311 Senile degeneration of brain, not elsewhere classified: Secondary | ICD-10-CM | POA: Diagnosis not present

## 2015-06-22 DIAGNOSIS — Z433 Encounter for attention to colostomy: Secondary | ICD-10-CM | POA: Diagnosis not present

## 2015-06-22 DIAGNOSIS — M6281 Muscle weakness (generalized): Secondary | ICD-10-CM | POA: Diagnosis not present

## 2015-06-22 DIAGNOSIS — G311 Senile degeneration of brain, not elsewhere classified: Secondary | ICD-10-CM | POA: Diagnosis not present

## 2015-06-22 DIAGNOSIS — L89892 Pressure ulcer of other site, stage 2: Secondary | ICD-10-CM | POA: Diagnosis not present

## 2015-06-22 DIAGNOSIS — E43 Unspecified severe protein-calorie malnutrition: Secondary | ICD-10-CM | POA: Diagnosis not present

## 2015-06-22 DIAGNOSIS — F0630 Mood disorder due to known physiological condition, unspecified: Secondary | ICD-10-CM | POA: Diagnosis not present

## 2015-06-23 DIAGNOSIS — E43 Unspecified severe protein-calorie malnutrition: Secondary | ICD-10-CM | POA: Diagnosis not present

## 2015-06-23 DIAGNOSIS — Z433 Encounter for attention to colostomy: Secondary | ICD-10-CM | POA: Diagnosis not present

## 2015-06-23 DIAGNOSIS — L89892 Pressure ulcer of other site, stage 2: Secondary | ICD-10-CM | POA: Diagnosis not present

## 2015-06-23 DIAGNOSIS — F0630 Mood disorder due to known physiological condition, unspecified: Secondary | ICD-10-CM | POA: Diagnosis not present

## 2015-06-23 DIAGNOSIS — M6281 Muscle weakness (generalized): Secondary | ICD-10-CM | POA: Diagnosis not present

## 2015-06-23 DIAGNOSIS — G311 Senile degeneration of brain, not elsewhere classified: Secondary | ICD-10-CM | POA: Diagnosis not present

## 2015-06-24 DIAGNOSIS — M6281 Muscle weakness (generalized): Secondary | ICD-10-CM | POA: Diagnosis not present

## 2015-06-24 DIAGNOSIS — L89892 Pressure ulcer of other site, stage 2: Secondary | ICD-10-CM | POA: Diagnosis not present

## 2015-06-24 DIAGNOSIS — Z433 Encounter for attention to colostomy: Secondary | ICD-10-CM | POA: Diagnosis not present

## 2015-06-24 DIAGNOSIS — F0630 Mood disorder due to known physiological condition, unspecified: Secondary | ICD-10-CM | POA: Diagnosis not present

## 2015-06-24 DIAGNOSIS — E43 Unspecified severe protein-calorie malnutrition: Secondary | ICD-10-CM | POA: Diagnosis not present

## 2015-06-24 DIAGNOSIS — G311 Senile degeneration of brain, not elsewhere classified: Secondary | ICD-10-CM | POA: Diagnosis not present

## 2015-06-25 DIAGNOSIS — M6281 Muscle weakness (generalized): Secondary | ICD-10-CM | POA: Diagnosis not present

## 2015-06-25 DIAGNOSIS — E43 Unspecified severe protein-calorie malnutrition: Secondary | ICD-10-CM | POA: Diagnosis not present

## 2015-06-25 DIAGNOSIS — Z433 Encounter for attention to colostomy: Secondary | ICD-10-CM | POA: Diagnosis not present

## 2015-06-25 DIAGNOSIS — G311 Senile degeneration of brain, not elsewhere classified: Secondary | ICD-10-CM | POA: Diagnosis not present

## 2015-06-25 DIAGNOSIS — L89892 Pressure ulcer of other site, stage 2: Secondary | ICD-10-CM | POA: Diagnosis not present

## 2015-06-25 DIAGNOSIS — F0630 Mood disorder due to known physiological condition, unspecified: Secondary | ICD-10-CM | POA: Diagnosis not present

## 2015-06-26 DIAGNOSIS — G311 Senile degeneration of brain, not elsewhere classified: Secondary | ICD-10-CM | POA: Diagnosis not present

## 2015-06-26 DIAGNOSIS — M6281 Muscle weakness (generalized): Secondary | ICD-10-CM | POA: Diagnosis not present

## 2015-06-26 DIAGNOSIS — E43 Unspecified severe protein-calorie malnutrition: Secondary | ICD-10-CM | POA: Diagnosis not present

## 2015-06-26 DIAGNOSIS — Z433 Encounter for attention to colostomy: Secondary | ICD-10-CM | POA: Diagnosis not present

## 2015-06-26 DIAGNOSIS — L89892 Pressure ulcer of other site, stage 2: Secondary | ICD-10-CM | POA: Diagnosis not present

## 2015-06-26 DIAGNOSIS — F0630 Mood disorder due to known physiological condition, unspecified: Secondary | ICD-10-CM | POA: Diagnosis not present

## 2015-06-29 DIAGNOSIS — M6281 Muscle weakness (generalized): Secondary | ICD-10-CM | POA: Diagnosis not present

## 2015-06-29 DIAGNOSIS — F0630 Mood disorder due to known physiological condition, unspecified: Secondary | ICD-10-CM | POA: Diagnosis not present

## 2015-06-29 DIAGNOSIS — L89892 Pressure ulcer of other site, stage 2: Secondary | ICD-10-CM | POA: Diagnosis not present

## 2015-06-29 DIAGNOSIS — G311 Senile degeneration of brain, not elsewhere classified: Secondary | ICD-10-CM | POA: Diagnosis not present

## 2015-06-29 DIAGNOSIS — E43 Unspecified severe protein-calorie malnutrition: Secondary | ICD-10-CM | POA: Diagnosis not present

## 2015-06-29 DIAGNOSIS — Z433 Encounter for attention to colostomy: Secondary | ICD-10-CM | POA: Diagnosis not present

## 2015-07-01 DIAGNOSIS — M6281 Muscle weakness (generalized): Secondary | ICD-10-CM | POA: Diagnosis not present

## 2015-07-01 DIAGNOSIS — G311 Senile degeneration of brain, not elsewhere classified: Secondary | ICD-10-CM | POA: Diagnosis not present

## 2015-07-01 DIAGNOSIS — E43 Unspecified severe protein-calorie malnutrition: Secondary | ICD-10-CM | POA: Diagnosis not present

## 2015-07-01 DIAGNOSIS — L89892 Pressure ulcer of other site, stage 2: Secondary | ICD-10-CM | POA: Diagnosis not present

## 2015-07-01 DIAGNOSIS — F0630 Mood disorder due to known physiological condition, unspecified: Secondary | ICD-10-CM | POA: Diagnosis not present

## 2015-07-01 DIAGNOSIS — Z433 Encounter for attention to colostomy: Secondary | ICD-10-CM | POA: Diagnosis not present

## 2015-07-03 DIAGNOSIS — L89892 Pressure ulcer of other site, stage 2: Secondary | ICD-10-CM | POA: Diagnosis not present

## 2015-07-03 DIAGNOSIS — M6281 Muscle weakness (generalized): Secondary | ICD-10-CM | POA: Diagnosis not present

## 2015-07-03 DIAGNOSIS — Z433 Encounter for attention to colostomy: Secondary | ICD-10-CM | POA: Diagnosis not present

## 2015-07-03 DIAGNOSIS — G311 Senile degeneration of brain, not elsewhere classified: Secondary | ICD-10-CM | POA: Diagnosis not present

## 2015-07-03 DIAGNOSIS — F0630 Mood disorder due to known physiological condition, unspecified: Secondary | ICD-10-CM | POA: Diagnosis not present

## 2015-07-03 DIAGNOSIS — E43 Unspecified severe protein-calorie malnutrition: Secondary | ICD-10-CM | POA: Diagnosis not present

## 2015-07-05 DIAGNOSIS — L89892 Pressure ulcer of other site, stage 2: Secondary | ICD-10-CM | POA: Diagnosis not present

## 2015-07-05 DIAGNOSIS — F0630 Mood disorder due to known physiological condition, unspecified: Secondary | ICD-10-CM | POA: Diagnosis not present

## 2015-07-05 DIAGNOSIS — G311 Senile degeneration of brain, not elsewhere classified: Secondary | ICD-10-CM | POA: Diagnosis not present

## 2015-07-05 DIAGNOSIS — E43 Unspecified severe protein-calorie malnutrition: Secondary | ICD-10-CM | POA: Diagnosis not present

## 2015-07-05 DIAGNOSIS — Z433 Encounter for attention to colostomy: Secondary | ICD-10-CM | POA: Diagnosis not present

## 2015-07-05 DIAGNOSIS — M6281 Muscle weakness (generalized): Secondary | ICD-10-CM | POA: Diagnosis not present

## 2015-07-06 DIAGNOSIS — G311 Senile degeneration of brain, not elsewhere classified: Secondary | ICD-10-CM | POA: Diagnosis not present

## 2015-07-06 DIAGNOSIS — Z433 Encounter for attention to colostomy: Secondary | ICD-10-CM | POA: Diagnosis not present

## 2015-07-06 DIAGNOSIS — L89892 Pressure ulcer of other site, stage 2: Secondary | ICD-10-CM | POA: Diagnosis not present

## 2015-07-06 DIAGNOSIS — E43 Unspecified severe protein-calorie malnutrition: Secondary | ICD-10-CM | POA: Diagnosis not present

## 2015-07-06 DIAGNOSIS — F0630 Mood disorder due to known physiological condition, unspecified: Secondary | ICD-10-CM | POA: Diagnosis not present

## 2015-07-06 DIAGNOSIS — M6281 Muscle weakness (generalized): Secondary | ICD-10-CM | POA: Diagnosis not present

## 2015-07-08 DIAGNOSIS — E43 Unspecified severe protein-calorie malnutrition: Secondary | ICD-10-CM | POA: Diagnosis not present

## 2015-07-08 DIAGNOSIS — Z433 Encounter for attention to colostomy: Secondary | ICD-10-CM | POA: Diagnosis not present

## 2015-07-08 DIAGNOSIS — G311 Senile degeneration of brain, not elsewhere classified: Secondary | ICD-10-CM | POA: Diagnosis not present

## 2015-07-08 DIAGNOSIS — F0630 Mood disorder due to known physiological condition, unspecified: Secondary | ICD-10-CM | POA: Diagnosis not present

## 2015-07-08 DIAGNOSIS — L89892 Pressure ulcer of other site, stage 2: Secondary | ICD-10-CM | POA: Diagnosis not present

## 2015-07-08 DIAGNOSIS — M6281 Muscle weakness (generalized): Secondary | ICD-10-CM | POA: Diagnosis not present

## 2015-07-10 DIAGNOSIS — F0630 Mood disorder due to known physiological condition, unspecified: Secondary | ICD-10-CM | POA: Diagnosis not present

## 2015-07-10 DIAGNOSIS — G311 Senile degeneration of brain, not elsewhere classified: Secondary | ICD-10-CM | POA: Diagnosis not present

## 2015-07-10 DIAGNOSIS — M6281 Muscle weakness (generalized): Secondary | ICD-10-CM | POA: Diagnosis not present

## 2015-07-10 DIAGNOSIS — Z433 Encounter for attention to colostomy: Secondary | ICD-10-CM | POA: Diagnosis not present

## 2015-07-10 DIAGNOSIS — E43 Unspecified severe protein-calorie malnutrition: Secondary | ICD-10-CM | POA: Diagnosis not present

## 2015-07-10 DIAGNOSIS — L89892 Pressure ulcer of other site, stage 2: Secondary | ICD-10-CM | POA: Diagnosis not present

## 2015-07-15 DIAGNOSIS — E43 Unspecified severe protein-calorie malnutrition: Secondary | ICD-10-CM | POA: Diagnosis not present

## 2015-07-15 DIAGNOSIS — M6281 Muscle weakness (generalized): Secondary | ICD-10-CM | POA: Diagnosis not present

## 2015-07-15 DIAGNOSIS — Z433 Encounter for attention to colostomy: Secondary | ICD-10-CM | POA: Diagnosis not present

## 2015-07-15 DIAGNOSIS — G311 Senile degeneration of brain, not elsewhere classified: Secondary | ICD-10-CM | POA: Diagnosis not present

## 2015-07-15 DIAGNOSIS — L89892 Pressure ulcer of other site, stage 2: Secondary | ICD-10-CM | POA: Diagnosis not present

## 2015-07-15 DIAGNOSIS — F0630 Mood disorder due to known physiological condition, unspecified: Secondary | ICD-10-CM | POA: Diagnosis not present

## 2015-07-16 DIAGNOSIS — F0630 Mood disorder due to known physiological condition, unspecified: Secondary | ICD-10-CM | POA: Diagnosis not present

## 2015-07-16 DIAGNOSIS — Z433 Encounter for attention to colostomy: Secondary | ICD-10-CM | POA: Diagnosis not present

## 2015-07-16 DIAGNOSIS — E43 Unspecified severe protein-calorie malnutrition: Secondary | ICD-10-CM | POA: Diagnosis not present

## 2015-07-16 DIAGNOSIS — L89892 Pressure ulcer of other site, stage 2: Secondary | ICD-10-CM | POA: Diagnosis not present

## 2015-07-16 DIAGNOSIS — G311 Senile degeneration of brain, not elsewhere classified: Secondary | ICD-10-CM | POA: Diagnosis not present

## 2015-07-16 DIAGNOSIS — M6281 Muscle weakness (generalized): Secondary | ICD-10-CM | POA: Diagnosis not present

## 2015-07-17 DIAGNOSIS — G311 Senile degeneration of brain, not elsewhere classified: Secondary | ICD-10-CM | POA: Diagnosis not present

## 2015-07-17 DIAGNOSIS — L89892 Pressure ulcer of other site, stage 2: Secondary | ICD-10-CM | POA: Diagnosis not present

## 2015-07-17 DIAGNOSIS — M6281 Muscle weakness (generalized): Secondary | ICD-10-CM | POA: Diagnosis not present

## 2015-07-17 DIAGNOSIS — E43 Unspecified severe protein-calorie malnutrition: Secondary | ICD-10-CM | POA: Diagnosis not present

## 2015-07-17 DIAGNOSIS — F0630 Mood disorder due to known physiological condition, unspecified: Secondary | ICD-10-CM | POA: Diagnosis not present

## 2015-07-17 DIAGNOSIS — Z433 Encounter for attention to colostomy: Secondary | ICD-10-CM | POA: Diagnosis not present

## 2015-07-19 DIAGNOSIS — F0630 Mood disorder due to known physiological condition, unspecified: Secondary | ICD-10-CM | POA: Diagnosis not present

## 2015-07-19 DIAGNOSIS — E43 Unspecified severe protein-calorie malnutrition: Secondary | ICD-10-CM | POA: Diagnosis not present

## 2015-07-19 DIAGNOSIS — L89892 Pressure ulcer of other site, stage 2: Secondary | ICD-10-CM | POA: Diagnosis not present

## 2015-07-19 DIAGNOSIS — M6281 Muscle weakness (generalized): Secondary | ICD-10-CM | POA: Diagnosis not present

## 2015-07-19 DIAGNOSIS — Z433 Encounter for attention to colostomy: Secondary | ICD-10-CM | POA: Diagnosis not present

## 2015-07-19 DIAGNOSIS — I482 Chronic atrial fibrillation: Secondary | ICD-10-CM | POA: Diagnosis not present

## 2015-07-19 DIAGNOSIS — G311 Senile degeneration of brain, not elsewhere classified: Secondary | ICD-10-CM | POA: Diagnosis not present

## 2015-07-20 DIAGNOSIS — Z433 Encounter for attention to colostomy: Secondary | ICD-10-CM | POA: Diagnosis not present

## 2015-07-20 DIAGNOSIS — G311 Senile degeneration of brain, not elsewhere classified: Secondary | ICD-10-CM | POA: Diagnosis not present

## 2015-07-20 DIAGNOSIS — E43 Unspecified severe protein-calorie malnutrition: Secondary | ICD-10-CM | POA: Diagnosis not present

## 2015-07-20 DIAGNOSIS — F0630 Mood disorder due to known physiological condition, unspecified: Secondary | ICD-10-CM | POA: Diagnosis not present

## 2015-07-20 DIAGNOSIS — M6281 Muscle weakness (generalized): Secondary | ICD-10-CM | POA: Diagnosis not present

## 2015-07-20 DIAGNOSIS — L89892 Pressure ulcer of other site, stage 2: Secondary | ICD-10-CM | POA: Diagnosis not present

## 2015-07-22 DIAGNOSIS — E43 Unspecified severe protein-calorie malnutrition: Secondary | ICD-10-CM | POA: Diagnosis not present

## 2015-07-22 DIAGNOSIS — L89892 Pressure ulcer of other site, stage 2: Secondary | ICD-10-CM | POA: Diagnosis not present

## 2015-07-22 DIAGNOSIS — Z433 Encounter for attention to colostomy: Secondary | ICD-10-CM | POA: Diagnosis not present

## 2015-07-22 DIAGNOSIS — M6281 Muscle weakness (generalized): Secondary | ICD-10-CM | POA: Diagnosis not present

## 2015-07-22 DIAGNOSIS — G311 Senile degeneration of brain, not elsewhere classified: Secondary | ICD-10-CM | POA: Diagnosis not present

## 2015-07-22 DIAGNOSIS — F0630 Mood disorder due to known physiological condition, unspecified: Secondary | ICD-10-CM | POA: Diagnosis not present

## 2015-07-24 DIAGNOSIS — L89892 Pressure ulcer of other site, stage 2: Secondary | ICD-10-CM | POA: Diagnosis not present

## 2015-07-24 DIAGNOSIS — F0630 Mood disorder due to known physiological condition, unspecified: Secondary | ICD-10-CM | POA: Diagnosis not present

## 2015-07-24 DIAGNOSIS — G311 Senile degeneration of brain, not elsewhere classified: Secondary | ICD-10-CM | POA: Diagnosis not present

## 2015-07-24 DIAGNOSIS — M6281 Muscle weakness (generalized): Secondary | ICD-10-CM | POA: Diagnosis not present

## 2015-07-24 DIAGNOSIS — Z433 Encounter for attention to colostomy: Secondary | ICD-10-CM | POA: Diagnosis not present

## 2015-07-24 DIAGNOSIS — E43 Unspecified severe protein-calorie malnutrition: Secondary | ICD-10-CM | POA: Diagnosis not present

## 2015-07-31 DIAGNOSIS — F0630 Mood disorder due to known physiological condition, unspecified: Secondary | ICD-10-CM | POA: Diagnosis not present

## 2015-07-31 DIAGNOSIS — L89892 Pressure ulcer of other site, stage 2: Secondary | ICD-10-CM | POA: Diagnosis not present

## 2015-07-31 DIAGNOSIS — Z433 Encounter for attention to colostomy: Secondary | ICD-10-CM | POA: Diagnosis not present

## 2015-07-31 DIAGNOSIS — E43 Unspecified severe protein-calorie malnutrition: Secondary | ICD-10-CM | POA: Diagnosis not present

## 2015-07-31 DIAGNOSIS — G311 Senile degeneration of brain, not elsewhere classified: Secondary | ICD-10-CM | POA: Diagnosis not present

## 2015-07-31 DIAGNOSIS — M6281 Muscle weakness (generalized): Secondary | ICD-10-CM | POA: Diagnosis not present

## 2015-08-03 DIAGNOSIS — G311 Senile degeneration of brain, not elsewhere classified: Secondary | ICD-10-CM | POA: Diagnosis not present

## 2015-08-03 DIAGNOSIS — L89892 Pressure ulcer of other site, stage 2: Secondary | ICD-10-CM | POA: Diagnosis not present

## 2015-08-03 DIAGNOSIS — F0630 Mood disorder due to known physiological condition, unspecified: Secondary | ICD-10-CM | POA: Diagnosis not present

## 2015-08-03 DIAGNOSIS — E43 Unspecified severe protein-calorie malnutrition: Secondary | ICD-10-CM | POA: Diagnosis not present

## 2015-08-03 DIAGNOSIS — Z433 Encounter for attention to colostomy: Secondary | ICD-10-CM | POA: Diagnosis not present

## 2015-08-03 DIAGNOSIS — M6281 Muscle weakness (generalized): Secondary | ICD-10-CM | POA: Diagnosis not present

## 2015-08-05 DIAGNOSIS — F0630 Mood disorder due to known physiological condition, unspecified: Secondary | ICD-10-CM | POA: Diagnosis not present

## 2015-08-05 DIAGNOSIS — L89892 Pressure ulcer of other site, stage 2: Secondary | ICD-10-CM | POA: Diagnosis not present

## 2015-08-05 DIAGNOSIS — Z433 Encounter for attention to colostomy: Secondary | ICD-10-CM | POA: Diagnosis not present

## 2015-08-05 DIAGNOSIS — G311 Senile degeneration of brain, not elsewhere classified: Secondary | ICD-10-CM | POA: Diagnosis not present

## 2015-08-05 DIAGNOSIS — E43 Unspecified severe protein-calorie malnutrition: Secondary | ICD-10-CM | POA: Diagnosis not present

## 2015-08-05 DIAGNOSIS — M6281 Muscle weakness (generalized): Secondary | ICD-10-CM | POA: Diagnosis not present

## 2015-08-07 DIAGNOSIS — L89892 Pressure ulcer of other site, stage 2: Secondary | ICD-10-CM | POA: Diagnosis not present

## 2015-08-07 DIAGNOSIS — Z433 Encounter for attention to colostomy: Secondary | ICD-10-CM | POA: Diagnosis not present

## 2015-08-07 DIAGNOSIS — E43 Unspecified severe protein-calorie malnutrition: Secondary | ICD-10-CM | POA: Diagnosis not present

## 2015-08-07 DIAGNOSIS — M6281 Muscle weakness (generalized): Secondary | ICD-10-CM | POA: Diagnosis not present

## 2015-08-07 DIAGNOSIS — G311 Senile degeneration of brain, not elsewhere classified: Secondary | ICD-10-CM | POA: Diagnosis not present

## 2015-08-07 DIAGNOSIS — F0630 Mood disorder due to known physiological condition, unspecified: Secondary | ICD-10-CM | POA: Diagnosis not present

## 2015-08-08 DIAGNOSIS — Z433 Encounter for attention to colostomy: Secondary | ICD-10-CM | POA: Diagnosis not present

## 2015-08-08 DIAGNOSIS — L89892 Pressure ulcer of other site, stage 2: Secondary | ICD-10-CM | POA: Diagnosis not present

## 2015-08-08 DIAGNOSIS — G311 Senile degeneration of brain, not elsewhere classified: Secondary | ICD-10-CM | POA: Diagnosis not present

## 2015-08-08 DIAGNOSIS — E43 Unspecified severe protein-calorie malnutrition: Secondary | ICD-10-CM | POA: Diagnosis not present

## 2015-08-08 DIAGNOSIS — M6281 Muscle weakness (generalized): Secondary | ICD-10-CM | POA: Diagnosis not present

## 2015-08-08 DIAGNOSIS — F0630 Mood disorder due to known physiological condition, unspecified: Secondary | ICD-10-CM | POA: Diagnosis not present

## 2015-08-10 DIAGNOSIS — M6281 Muscle weakness (generalized): Secondary | ICD-10-CM | POA: Diagnosis not present

## 2015-08-10 DIAGNOSIS — L89892 Pressure ulcer of other site, stage 2: Secondary | ICD-10-CM | POA: Diagnosis not present

## 2015-08-10 DIAGNOSIS — E43 Unspecified severe protein-calorie malnutrition: Secondary | ICD-10-CM | POA: Diagnosis not present

## 2015-08-10 DIAGNOSIS — Z433 Encounter for attention to colostomy: Secondary | ICD-10-CM | POA: Diagnosis not present

## 2015-08-10 DIAGNOSIS — G311 Senile degeneration of brain, not elsewhere classified: Secondary | ICD-10-CM | POA: Diagnosis not present

## 2015-08-10 DIAGNOSIS — F0630 Mood disorder due to known physiological condition, unspecified: Secondary | ICD-10-CM | POA: Diagnosis not present

## 2015-08-12 DIAGNOSIS — G311 Senile degeneration of brain, not elsewhere classified: Secondary | ICD-10-CM | POA: Diagnosis not present

## 2015-08-12 DIAGNOSIS — E43 Unspecified severe protein-calorie malnutrition: Secondary | ICD-10-CM | POA: Diagnosis not present

## 2015-08-12 DIAGNOSIS — F0630 Mood disorder due to known physiological condition, unspecified: Secondary | ICD-10-CM | POA: Diagnosis not present

## 2015-08-12 DIAGNOSIS — M6281 Muscle weakness (generalized): Secondary | ICD-10-CM | POA: Diagnosis not present

## 2015-08-12 DIAGNOSIS — Z433 Encounter for attention to colostomy: Secondary | ICD-10-CM | POA: Diagnosis not present

## 2015-08-12 DIAGNOSIS — L89892 Pressure ulcer of other site, stage 2: Secondary | ICD-10-CM | POA: Diagnosis not present

## 2015-08-13 DIAGNOSIS — G311 Senile degeneration of brain, not elsewhere classified: Secondary | ICD-10-CM | POA: Diagnosis not present

## 2015-08-13 DIAGNOSIS — Z433 Encounter for attention to colostomy: Secondary | ICD-10-CM | POA: Diagnosis not present

## 2015-08-13 DIAGNOSIS — F0630 Mood disorder due to known physiological condition, unspecified: Secondary | ICD-10-CM | POA: Diagnosis not present

## 2015-08-13 DIAGNOSIS — E43 Unspecified severe protein-calorie malnutrition: Secondary | ICD-10-CM | POA: Diagnosis not present

## 2015-08-13 DIAGNOSIS — M6281 Muscle weakness (generalized): Secondary | ICD-10-CM | POA: Diagnosis not present

## 2015-08-13 DIAGNOSIS — L89892 Pressure ulcer of other site, stage 2: Secondary | ICD-10-CM | POA: Diagnosis not present

## 2015-08-14 DIAGNOSIS — E43 Unspecified severe protein-calorie malnutrition: Secondary | ICD-10-CM | POA: Diagnosis not present

## 2015-08-14 DIAGNOSIS — Z433 Encounter for attention to colostomy: Secondary | ICD-10-CM | POA: Diagnosis not present

## 2015-08-14 DIAGNOSIS — F0630 Mood disorder due to known physiological condition, unspecified: Secondary | ICD-10-CM | POA: Diagnosis not present

## 2015-08-14 DIAGNOSIS — M6281 Muscle weakness (generalized): Secondary | ICD-10-CM | POA: Diagnosis not present

## 2015-08-14 DIAGNOSIS — L89892 Pressure ulcer of other site, stage 2: Secondary | ICD-10-CM | POA: Diagnosis not present

## 2015-08-14 DIAGNOSIS — G311 Senile degeneration of brain, not elsewhere classified: Secondary | ICD-10-CM | POA: Diagnosis not present

## 2015-08-16 DIAGNOSIS — E43 Unspecified severe protein-calorie malnutrition: Secondary | ICD-10-CM | POA: Diagnosis not present

## 2015-08-16 DIAGNOSIS — G311 Senile degeneration of brain, not elsewhere classified: Secondary | ICD-10-CM | POA: Diagnosis not present

## 2015-08-16 DIAGNOSIS — Z433 Encounter for attention to colostomy: Secondary | ICD-10-CM | POA: Diagnosis not present

## 2015-08-16 DIAGNOSIS — F0630 Mood disorder due to known physiological condition, unspecified: Secondary | ICD-10-CM | POA: Diagnosis not present

## 2015-08-16 DIAGNOSIS — L89892 Pressure ulcer of other site, stage 2: Secondary | ICD-10-CM | POA: Diagnosis not present

## 2015-08-16 DIAGNOSIS — M6281 Muscle weakness (generalized): Secondary | ICD-10-CM | POA: Diagnosis not present

## 2015-08-17 DIAGNOSIS — G311 Senile degeneration of brain, not elsewhere classified: Secondary | ICD-10-CM | POA: Diagnosis not present

## 2015-08-17 DIAGNOSIS — M6281 Muscle weakness (generalized): Secondary | ICD-10-CM | POA: Diagnosis not present

## 2015-08-17 DIAGNOSIS — F0630 Mood disorder due to known physiological condition, unspecified: Secondary | ICD-10-CM | POA: Diagnosis not present

## 2015-08-17 DIAGNOSIS — E43 Unspecified severe protein-calorie malnutrition: Secondary | ICD-10-CM | POA: Diagnosis not present

## 2015-08-17 DIAGNOSIS — Z433 Encounter for attention to colostomy: Secondary | ICD-10-CM | POA: Diagnosis not present

## 2015-08-17 DIAGNOSIS — L89892 Pressure ulcer of other site, stage 2: Secondary | ICD-10-CM | POA: Diagnosis not present

## 2015-08-19 DIAGNOSIS — G311 Senile degeneration of brain, not elsewhere classified: Secondary | ICD-10-CM | POA: Diagnosis not present

## 2015-08-19 DIAGNOSIS — L89892 Pressure ulcer of other site, stage 2: Secondary | ICD-10-CM | POA: Diagnosis not present

## 2015-08-19 DIAGNOSIS — I482 Chronic atrial fibrillation: Secondary | ICD-10-CM | POA: Diagnosis not present

## 2015-08-19 DIAGNOSIS — M6281 Muscle weakness (generalized): Secondary | ICD-10-CM | POA: Diagnosis not present

## 2015-08-19 DIAGNOSIS — Z433 Encounter for attention to colostomy: Secondary | ICD-10-CM | POA: Diagnosis not present

## 2015-08-19 DIAGNOSIS — E43 Unspecified severe protein-calorie malnutrition: Secondary | ICD-10-CM | POA: Diagnosis not present

## 2015-08-19 DIAGNOSIS — F0630 Mood disorder due to known physiological condition, unspecified: Secondary | ICD-10-CM | POA: Diagnosis not present

## 2015-08-20 DIAGNOSIS — E43 Unspecified severe protein-calorie malnutrition: Secondary | ICD-10-CM | POA: Diagnosis not present

## 2015-08-20 DIAGNOSIS — M6281 Muscle weakness (generalized): Secondary | ICD-10-CM | POA: Diagnosis not present

## 2015-08-20 DIAGNOSIS — F0630 Mood disorder due to known physiological condition, unspecified: Secondary | ICD-10-CM | POA: Diagnosis not present

## 2015-08-20 DIAGNOSIS — L89892 Pressure ulcer of other site, stage 2: Secondary | ICD-10-CM | POA: Diagnosis not present

## 2015-08-20 DIAGNOSIS — G311 Senile degeneration of brain, not elsewhere classified: Secondary | ICD-10-CM | POA: Diagnosis not present

## 2015-08-20 DIAGNOSIS — Z433 Encounter for attention to colostomy: Secondary | ICD-10-CM | POA: Diagnosis not present

## 2015-08-21 DIAGNOSIS — Z433 Encounter for attention to colostomy: Secondary | ICD-10-CM | POA: Diagnosis not present

## 2015-08-21 DIAGNOSIS — L89892 Pressure ulcer of other site, stage 2: Secondary | ICD-10-CM | POA: Diagnosis not present

## 2015-08-21 DIAGNOSIS — G311 Senile degeneration of brain, not elsewhere classified: Secondary | ICD-10-CM | POA: Diagnosis not present

## 2015-08-21 DIAGNOSIS — E43 Unspecified severe protein-calorie malnutrition: Secondary | ICD-10-CM | POA: Diagnosis not present

## 2015-08-21 DIAGNOSIS — M6281 Muscle weakness (generalized): Secondary | ICD-10-CM | POA: Diagnosis not present

## 2015-08-21 DIAGNOSIS — F0630 Mood disorder due to known physiological condition, unspecified: Secondary | ICD-10-CM | POA: Diagnosis not present

## 2015-08-24 DIAGNOSIS — Z433 Encounter for attention to colostomy: Secondary | ICD-10-CM | POA: Diagnosis not present

## 2015-08-24 DIAGNOSIS — L89892 Pressure ulcer of other site, stage 2: Secondary | ICD-10-CM | POA: Diagnosis not present

## 2015-08-24 DIAGNOSIS — E43 Unspecified severe protein-calorie malnutrition: Secondary | ICD-10-CM | POA: Diagnosis not present

## 2015-08-24 DIAGNOSIS — M6281 Muscle weakness (generalized): Secondary | ICD-10-CM | POA: Diagnosis not present

## 2015-08-24 DIAGNOSIS — F0630 Mood disorder due to known physiological condition, unspecified: Secondary | ICD-10-CM | POA: Diagnosis not present

## 2015-08-24 DIAGNOSIS — G311 Senile degeneration of brain, not elsewhere classified: Secondary | ICD-10-CM | POA: Diagnosis not present

## 2015-08-25 DIAGNOSIS — E43 Unspecified severe protein-calorie malnutrition: Secondary | ICD-10-CM | POA: Diagnosis not present

## 2015-08-25 DIAGNOSIS — F0630 Mood disorder due to known physiological condition, unspecified: Secondary | ICD-10-CM | POA: Diagnosis not present

## 2015-08-25 DIAGNOSIS — Z433 Encounter for attention to colostomy: Secondary | ICD-10-CM | POA: Diagnosis not present

## 2015-08-25 DIAGNOSIS — G311 Senile degeneration of brain, not elsewhere classified: Secondary | ICD-10-CM | POA: Diagnosis not present

## 2015-08-25 DIAGNOSIS — M6281 Muscle weakness (generalized): Secondary | ICD-10-CM | POA: Diagnosis not present

## 2015-08-25 DIAGNOSIS — L89892 Pressure ulcer of other site, stage 2: Secondary | ICD-10-CM | POA: Diagnosis not present

## 2015-08-26 DIAGNOSIS — M6281 Muscle weakness (generalized): Secondary | ICD-10-CM | POA: Diagnosis not present

## 2015-08-26 DIAGNOSIS — E43 Unspecified severe protein-calorie malnutrition: Secondary | ICD-10-CM | POA: Diagnosis not present

## 2015-08-26 DIAGNOSIS — G311 Senile degeneration of brain, not elsewhere classified: Secondary | ICD-10-CM | POA: Diagnosis not present

## 2015-08-26 DIAGNOSIS — F0630 Mood disorder due to known physiological condition, unspecified: Secondary | ICD-10-CM | POA: Diagnosis not present

## 2015-08-26 DIAGNOSIS — L89892 Pressure ulcer of other site, stage 2: Secondary | ICD-10-CM | POA: Diagnosis not present

## 2015-08-26 DIAGNOSIS — Z433 Encounter for attention to colostomy: Secondary | ICD-10-CM | POA: Diagnosis not present

## 2015-08-28 DIAGNOSIS — M6281 Muscle weakness (generalized): Secondary | ICD-10-CM | POA: Diagnosis not present

## 2015-08-28 DIAGNOSIS — Z433 Encounter for attention to colostomy: Secondary | ICD-10-CM | POA: Diagnosis not present

## 2015-08-28 DIAGNOSIS — L89892 Pressure ulcer of other site, stage 2: Secondary | ICD-10-CM | POA: Diagnosis not present

## 2015-08-28 DIAGNOSIS — G311 Senile degeneration of brain, not elsewhere classified: Secondary | ICD-10-CM | POA: Diagnosis not present

## 2015-08-28 DIAGNOSIS — F0630 Mood disorder due to known physiological condition, unspecified: Secondary | ICD-10-CM | POA: Diagnosis not present

## 2015-08-28 DIAGNOSIS — E43 Unspecified severe protein-calorie malnutrition: Secondary | ICD-10-CM | POA: Diagnosis not present

## 2015-08-29 DIAGNOSIS — M6281 Muscle weakness (generalized): Secondary | ICD-10-CM | POA: Diagnosis not present

## 2015-08-29 DIAGNOSIS — L89892 Pressure ulcer of other site, stage 2: Secondary | ICD-10-CM | POA: Diagnosis not present

## 2015-08-29 DIAGNOSIS — G311 Senile degeneration of brain, not elsewhere classified: Secondary | ICD-10-CM | POA: Diagnosis not present

## 2015-08-29 DIAGNOSIS — Z433 Encounter for attention to colostomy: Secondary | ICD-10-CM | POA: Diagnosis not present

## 2015-08-29 DIAGNOSIS — F0630 Mood disorder due to known physiological condition, unspecified: Secondary | ICD-10-CM | POA: Diagnosis not present

## 2015-08-29 DIAGNOSIS — E43 Unspecified severe protein-calorie malnutrition: Secondary | ICD-10-CM | POA: Diagnosis not present

## 2015-08-31 DIAGNOSIS — F0630 Mood disorder due to known physiological condition, unspecified: Secondary | ICD-10-CM | POA: Diagnosis not present

## 2015-08-31 DIAGNOSIS — G311 Senile degeneration of brain, not elsewhere classified: Secondary | ICD-10-CM | POA: Diagnosis not present

## 2015-08-31 DIAGNOSIS — Z433 Encounter for attention to colostomy: Secondary | ICD-10-CM | POA: Diagnosis not present

## 2015-08-31 DIAGNOSIS — L89892 Pressure ulcer of other site, stage 2: Secondary | ICD-10-CM | POA: Diagnosis not present

## 2015-08-31 DIAGNOSIS — E43 Unspecified severe protein-calorie malnutrition: Secondary | ICD-10-CM | POA: Diagnosis not present

## 2015-08-31 DIAGNOSIS — M6281 Muscle weakness (generalized): Secondary | ICD-10-CM | POA: Diagnosis not present

## 2015-09-02 DIAGNOSIS — L89892 Pressure ulcer of other site, stage 2: Secondary | ICD-10-CM | POA: Diagnosis not present

## 2015-09-02 DIAGNOSIS — E43 Unspecified severe protein-calorie malnutrition: Secondary | ICD-10-CM | POA: Diagnosis not present

## 2015-09-02 DIAGNOSIS — F0630 Mood disorder due to known physiological condition, unspecified: Secondary | ICD-10-CM | POA: Diagnosis not present

## 2015-09-02 DIAGNOSIS — Z433 Encounter for attention to colostomy: Secondary | ICD-10-CM | POA: Diagnosis not present

## 2015-09-02 DIAGNOSIS — M6281 Muscle weakness (generalized): Secondary | ICD-10-CM | POA: Diagnosis not present

## 2015-09-02 DIAGNOSIS — G311 Senile degeneration of brain, not elsewhere classified: Secondary | ICD-10-CM | POA: Diagnosis not present

## 2015-09-04 DIAGNOSIS — G311 Senile degeneration of brain, not elsewhere classified: Secondary | ICD-10-CM | POA: Diagnosis not present

## 2015-09-04 DIAGNOSIS — Z433 Encounter for attention to colostomy: Secondary | ICD-10-CM | POA: Diagnosis not present

## 2015-09-04 DIAGNOSIS — M6281 Muscle weakness (generalized): Secondary | ICD-10-CM | POA: Diagnosis not present

## 2015-09-04 DIAGNOSIS — E43 Unspecified severe protein-calorie malnutrition: Secondary | ICD-10-CM | POA: Diagnosis not present

## 2015-09-04 DIAGNOSIS — L89892 Pressure ulcer of other site, stage 2: Secondary | ICD-10-CM | POA: Diagnosis not present

## 2015-09-04 DIAGNOSIS — F0630 Mood disorder due to known physiological condition, unspecified: Secondary | ICD-10-CM | POA: Diagnosis not present

## 2015-09-07 DIAGNOSIS — L89892 Pressure ulcer of other site, stage 2: Secondary | ICD-10-CM | POA: Diagnosis not present

## 2015-09-07 DIAGNOSIS — E43 Unspecified severe protein-calorie malnutrition: Secondary | ICD-10-CM | POA: Diagnosis not present

## 2015-09-07 DIAGNOSIS — F0630 Mood disorder due to known physiological condition, unspecified: Secondary | ICD-10-CM | POA: Diagnosis not present

## 2015-09-07 DIAGNOSIS — G311 Senile degeneration of brain, not elsewhere classified: Secondary | ICD-10-CM | POA: Diagnosis not present

## 2015-09-07 DIAGNOSIS — Z433 Encounter for attention to colostomy: Secondary | ICD-10-CM | POA: Diagnosis not present

## 2015-09-07 DIAGNOSIS — M6281 Muscle weakness (generalized): Secondary | ICD-10-CM | POA: Diagnosis not present

## 2015-09-09 DIAGNOSIS — E43 Unspecified severe protein-calorie malnutrition: Secondary | ICD-10-CM | POA: Diagnosis not present

## 2015-09-09 DIAGNOSIS — G311 Senile degeneration of brain, not elsewhere classified: Secondary | ICD-10-CM | POA: Diagnosis not present

## 2015-09-09 DIAGNOSIS — Z433 Encounter for attention to colostomy: Secondary | ICD-10-CM | POA: Diagnosis not present

## 2015-09-09 DIAGNOSIS — F0630 Mood disorder due to known physiological condition, unspecified: Secondary | ICD-10-CM | POA: Diagnosis not present

## 2015-09-09 DIAGNOSIS — M6281 Muscle weakness (generalized): Secondary | ICD-10-CM | POA: Diagnosis not present

## 2015-09-09 DIAGNOSIS — L89892 Pressure ulcer of other site, stage 2: Secondary | ICD-10-CM | POA: Diagnosis not present

## 2015-09-11 DIAGNOSIS — G311 Senile degeneration of brain, not elsewhere classified: Secondary | ICD-10-CM | POA: Diagnosis not present

## 2015-09-11 DIAGNOSIS — L89892 Pressure ulcer of other site, stage 2: Secondary | ICD-10-CM | POA: Diagnosis not present

## 2015-09-11 DIAGNOSIS — F0630 Mood disorder due to known physiological condition, unspecified: Secondary | ICD-10-CM | POA: Diagnosis not present

## 2015-09-11 DIAGNOSIS — M6281 Muscle weakness (generalized): Secondary | ICD-10-CM | POA: Diagnosis not present

## 2015-09-11 DIAGNOSIS — E43 Unspecified severe protein-calorie malnutrition: Secondary | ICD-10-CM | POA: Diagnosis not present

## 2015-09-11 DIAGNOSIS — Z433 Encounter for attention to colostomy: Secondary | ICD-10-CM | POA: Diagnosis not present

## 2015-09-15 DIAGNOSIS — Z433 Encounter for attention to colostomy: Secondary | ICD-10-CM | POA: Diagnosis not present

## 2015-09-15 DIAGNOSIS — G311 Senile degeneration of brain, not elsewhere classified: Secondary | ICD-10-CM | POA: Diagnosis not present

## 2015-09-15 DIAGNOSIS — M6281 Muscle weakness (generalized): Secondary | ICD-10-CM | POA: Diagnosis not present

## 2015-09-15 DIAGNOSIS — L89892 Pressure ulcer of other site, stage 2: Secondary | ICD-10-CM | POA: Diagnosis not present

## 2015-09-15 DIAGNOSIS — E43 Unspecified severe protein-calorie malnutrition: Secondary | ICD-10-CM | POA: Diagnosis not present

## 2015-09-15 DIAGNOSIS — F0630 Mood disorder due to known physiological condition, unspecified: Secondary | ICD-10-CM | POA: Diagnosis not present

## 2015-09-16 DIAGNOSIS — M6281 Muscle weakness (generalized): Secondary | ICD-10-CM | POA: Diagnosis not present

## 2015-09-16 DIAGNOSIS — I482 Chronic atrial fibrillation: Secondary | ICD-10-CM | POA: Diagnosis not present

## 2015-09-16 DIAGNOSIS — G311 Senile degeneration of brain, not elsewhere classified: Secondary | ICD-10-CM | POA: Diagnosis not present

## 2015-09-16 DIAGNOSIS — Z433 Encounter for attention to colostomy: Secondary | ICD-10-CM | POA: Diagnosis not present

## 2015-09-16 DIAGNOSIS — F0630 Mood disorder due to known physiological condition, unspecified: Secondary | ICD-10-CM | POA: Diagnosis not present

## 2015-09-16 DIAGNOSIS — E43 Unspecified severe protein-calorie malnutrition: Secondary | ICD-10-CM | POA: Diagnosis not present

## 2015-09-16 DIAGNOSIS — L89892 Pressure ulcer of other site, stage 2: Secondary | ICD-10-CM | POA: Diagnosis not present

## 2015-10-17 DIAGNOSIS — Z433 Encounter for attention to colostomy: Secondary | ICD-10-CM | POA: Diagnosis not present

## 2015-10-17 DIAGNOSIS — G311 Senile degeneration of brain, not elsewhere classified: Secondary | ICD-10-CM | POA: Diagnosis not present

## 2015-10-17 DIAGNOSIS — M6281 Muscle weakness (generalized): Secondary | ICD-10-CM | POA: Diagnosis not present

## 2015-10-17 DIAGNOSIS — L89892 Pressure ulcer of other site, stage 2: Secondary | ICD-10-CM | POA: Diagnosis not present

## 2015-10-17 DIAGNOSIS — E43 Unspecified severe protein-calorie malnutrition: Secondary | ICD-10-CM | POA: Diagnosis not present

## 2015-10-17 DIAGNOSIS — F0630 Mood disorder due to known physiological condition, unspecified: Secondary | ICD-10-CM | POA: Diagnosis not present

## 2015-10-17 DIAGNOSIS — I482 Chronic atrial fibrillation: Secondary | ICD-10-CM | POA: Diagnosis not present

## 2018-08-29 ENCOUNTER — Encounter: Payer: Self-pay | Admitting: Surgery

## 2018-08-29 ENCOUNTER — Ambulatory Visit (INDEPENDENT_AMBULATORY_CARE_PROVIDER_SITE_OTHER): Payer: Medicare (Managed Care) | Admitting: Surgery

## 2018-08-29 ENCOUNTER — Encounter: Payer: Self-pay | Admitting: *Deleted

## 2018-08-29 ENCOUNTER — Other Ambulatory Visit: Payer: Self-pay

## 2018-08-29 VITALS — BP 152/98 | HR 88 | Temp 98.1°F | Resp 16

## 2018-08-29 DIAGNOSIS — K9419 Other complications of enterostomy: Secondary | ICD-10-CM | POA: Diagnosis not present

## 2018-08-29 NOTE — Patient Instructions (Signed)
We will order a C-diff test to check for infection. If there is no infection then you may start the imodium.    We will see you back in 2-3  weeks.

## 2018-08-29 NOTE — Progress Notes (Signed)
Patient to have a C diff done through [facility]. The patient and family are aware of instructions.   Dr. Dahlia Byes had also wanted records from op note done in 2015. These are in [place] and have been provided to him today for review.   *Dr. Dahlia Byes states that he does not need 2015 records to present from patient's PCP, Dr. Wilford Grist as originally indicated. We did receive notes from their office dated 08-27-18.  Patient will follow up in the office in 2 weeks as scheduled for 09-12-18 at 10:45 am with Dr. Dahlia Byes.

## 2018-08-31 NOTE — Progress Notes (Signed)
Patient ID: Jason Harmon, male   DOB: 04-Apr-1941, 78 y.o.   MRN: [id number]  HPI [name] is a 78 y.o. male in consultation for diarrhea and issues with colostomy. He had  A complicated surgical hx significant for bowel resection for SBO located by anastomotic leak and perforation into the sigmoid colon requiring emergency operation at [facility] with a damage control laparotomy and small bowel resection and sigmoid colectomy.  She did have an open abdomen and consequently had a small bowel anastomosis a gastrostomy and and sigmoid col ostomy by Dr. Donne Hazel September 2015. He is very debilitated has multiple medical issues.  Currently he is on a wheelchair and is total care.  He is able to tolerate nutrition.  Over the last few weeks the family informs me that he is having liquid stool from below with the colostomy and the rectum. No abdominal pain.  No fevers no chills and no change from her baseline status. No recent images or labs. Please note that I have personally reviewed operative reports extensively from outside facility. HE was on mechanical ventilation for a while requiring a trach. Currently he is breathing on his own.   HPI  Past Medical History:  Diagnosis Date  . A-fib ([place])   . Acute renal failure ([place])   . Ischemic bowel disease ([place])   . Renal disorder   . Sepsis [facility])     Past Surgical History:  Procedure Laterality Date  . ABDOMINAL SURGERY    . COLOSTOMY N/A 08/20/2013   Procedure: COLOSTOMY;  Surgeon: Rolm Bookbinder, MD;  Location: [place];  Service: General;  Laterality: N/A;  . GASTROSTOMY N/A 08/20/2013   Procedure: G-TUBE PLACEMENT;  Surgeon: Rolm Bookbinder, MD;  Location: [place];  Service: General;  Laterality: N/A;  . LAPAROTOMY N/A 08/18/2013   Procedure: EXPLORATORY LAPAROTOMY;  Surgeon: Joyice Faster. Cornett, MD;  Location: [place];  Service: General;  Laterality: N/A;  . LAPAROTOMY N/A 08/20/2013   Procedure: EXPLORATORY LAPAROTOMY WITH  SMALL BOWEL  RESECTION;  Surgeon: Rolm Bookbinder, MD;  [facility];  Service: General;  Laterality: N/A;    Family History  Problem Relation Age of Onset  . Aneurysm Mother     Social History Social History   Tobacco Use  . Smoking status: Never Smoker  . Smokeless tobacco: Never Used  Substance Use Topics  . Alcohol use: No  . Drug use: No    No Known Allergies  Current Outpatient Medications  Medication Sig Dispense Refill  . albuterol (PROVENTIL) (2.5 MG/3ML) 0.083% nebulizer solution Take 3 mLs (2.5 mg total) by nebulization every 4 (four) hours as needed for wheezing. 75 mL 12  . Amino Acids-Protein Hydrolys (FEEDING SUPPLEMENT, PRO-STAT SUGAR FREE 64,) LIQD Place 30 mLs into feeding tube 3 (three) times daily. 900 mL 0  . chlorhexidine (PERIDEX) 0.12 % solution Use as directed 15 mLs in the mouth or throat 2 (two) times daily.    . darbepoetin (ARANESP) 100 MCG/0.5ML SOLN injection Inject 0.5 mLs (100 mcg total) into the skin every Monday at 6 PM. 4.2 mL   . dextrose 5 % SOLN 500 mL with sulfamethoxazole-trimethoprim 400-80 MG/5ML SOLN 376 mg Inject 376 mg into the vein every 12 (twelve) hours.    . fat emulsion 20 % infusion Inject into the vein continuous. Pt was receiving 259ml at [facility] on M/W/F    . fentaNYL (SUBLIMAZE) 0.05 MG/ML injection Inject 0.5-1 mLs (25-50 mcg total) into the vein every 2 (two) hours as  needed for severe pain (to maintain RASS goal.). 2 mL 0  . hydroxypropyl methylcellulose (ISOPTO TEARS) 2.5 % ophthalmic solution Place 2 drops into both eyes every 4 (four) hours as needed for dry eyes.    . insulin aspart (NOVOLOG) 100 UNIT/ML injection Inject 0-15 Units into the skin every 4 (four) hours. 10 mL 11  . metoprolol (LOPRESSOR) 1 MG/ML injection Inject 5 mLs (5 mg total) into the vein every 4 (four) hours. 5 mL   . midazolam (VERSED) 2 MG/2ML SOLN injection Inject 1-2 mLs (1-2 mg total) into the vein every 3 (three) hours as needed for agitation or  sedation. 1 mL 0  . Nutritional Supplements (FEEDING SUPPLEMENT, VITAL 1.5 CAL,) LIQD Place 1,000 mLs into feeding tube continuous.    . pantoprazole (PROTONIX) 40 MG injection Inject 40 mg into the vein 2 (two) times daily.    . sodium chloride 0.9 % injection 10-40 mLs by Intracatheter route as needed (flush). 5 mL   . TPN (CLINIMIX-E)ADULT WITH LYTES Inject 65 mL/hr into the vein continuous.     No current facility-administered medications for this visit.      Review of Systems Full ROS  was asked and was negative except for the information on the HPI  Physical Exam Blood pressure (!) 152/98, pulse 88, temperature 98.1 F (36.7 C), temperature source Temporal, resp. rate 16, SpO2 97 %. CONSTITUTIONAL: NAD, debilitated male in a wheelchair EYES: Pupils are equal, round, and reactive to light, Sclera are non-icteric. EARS, NOSE, MOUTH AND THROAT: The oropharynx is clear. The oral mucosa is pink and moist. Hearing is intact to voice. LYMPH NODES:  Lymph nodes in the neck are normal. RESPIRATORY:  Lungs are clear. There is normal respiratory effort, with equal breath sounds bilaterally, and without pathologic use of accessory muscles. CARDIOVASCULAR: Heart is regular without murmurs, gallops, or rubs. GI: The abdomen is  soft, nontender, and nondistended. There are no palpable masses. There is no hepatosplenomegaly. There are normal bowel sounds in all quadrants. Colostomy in place GU: Rectal deferred.   MUSCULOSKELETAL: Normal muscle strength and tone. No cyanosis or edema.   SKIN: Turgor is good and there are no pathologic skin lesions or ulcers. NEUROLOGIC: He is able to talk and answers very simple questions., he is in a wheel chair w LE contractures. PSYCH:  Disoriented, no new neurological issues Data Reviewed  I have personally reviewed the patient's imaging, laboratory findings and medical records.    Assessment/Plan We will start w/u to include c dif.. I will see this pt in  2-3 weeks. Unsure why he is having diarrhea per rectum. We may have to do some imaging in the form of gastrografin enema to delineate his anatomy. Currently he is tolerating enteral nutrition his abdomen is benign and there is no need for surgical intervention.   Time spent with the patient was 45 minutes, with more than 50% of the time spent in face-to-face education, counseling and care coordination.     Caroleen Hamman, MD FACS General Surgeon 08/31/2018, 10:27 AM

## 2018-09-11 ENCOUNTER — Telehealth: Payer: Self-pay | Admitting: *Deleted

## 2018-09-11 NOTE — Telephone Encounter (Signed)
Patients wife called and stated that Mr Jason Harmon is very nervous about coming to his appointment tomorrow patient is aware its just an office visit but they are wanting to know if they can get a prescription to help with his nerves.

## 2018-09-11 NOTE — Telephone Encounter (Signed)
Patient wife states he is just nerves. I told her that he will be okay for his office visit tomorrow.

## 2018-09-12 ENCOUNTER — Ambulatory Visit: Payer: Medicare Other | Admitting: Surgery

## 2018-09-17 ENCOUNTER — Telehealth: Payer: Self-pay | Admitting: *Deleted

## 2018-09-17 NOTE — Telephone Encounter (Signed)
Patients wife called and stated that they go a bill from [place] and only medicare was filled but he has medicare and medicaid. Can you resubmit  the charges?

## 2018-09-19 ENCOUNTER — Ambulatory Visit (INDEPENDENT_AMBULATORY_CARE_PROVIDER_SITE_OTHER): Payer: Medicare (Managed Care) | Admitting: Surgery

## 2018-09-19 ENCOUNTER — Other Ambulatory Visit: Payer: Self-pay

## 2018-09-19 ENCOUNTER — Encounter: Payer: Self-pay | Admitting: Surgery

## 2018-09-19 VITALS — BP 114/77 | HR 76 | Temp 98.1°F | Resp 18 | Ht 71.0 in

## 2018-09-19 DIAGNOSIS — K9419 Other complications of enterostomy: Secondary | ICD-10-CM

## 2018-09-19 NOTE — Patient Instructions (Signed)
The patient is aware to call back for any questions or new concerns.  

## 2018-09-24 ENCOUNTER — Encounter: Payer: Self-pay | Admitting: Surgery

## 2018-09-24 NOTE — Progress Notes (Signed)
Outpatient Surgical Follow Up  09/24/2018  Jason Harmon is an 78 y.o. male.   Chief Complaint  Patient presents with  . 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  he did have an open abdomen and consequently had a small bowel anastomosis a gastrostomy and and end  colostomy by Dr. Donne Hazel September 2015. Imaging initially for having some stool from the rectum that it was watery.  This has improved significantly.  They were unable to collect the C. difficile which tells me that this is unlikely.  He is taking feeds well without any major issues.     Past Medical History:  Diagnosis Date  . A-fib ([place])   . Acute renal failure ([place])   . Ischemic bowel disease ([place])   . Renal disorder   . Sepsis [facility])     Past Surgical History:  Procedure Laterality Date  . ABDOMINAL SURGERY    . COLOSTOMY N/A 08/20/2013   Procedure: COLOSTOMY;  Surgeon: Rolm Bookbinder, MD;  Location: [place];  Service: General;  Laterality: N/A;  . GASTROSTOMY N/A 08/20/2013   Procedure: G-TUBE PLACEMENT;  Surgeon: Rolm Bookbinder, MD;  Location: [place];  Service: General;  Laterality: N/A;  . LAPAROTOMY N/A 08/18/2013   Procedure: EXPLORATORY LAPAROTOMY;  Surgeon: Joyice Faster. Cornett, MD;  Location: [place];  Service: General;  Laterality: N/A;  . LAPAROTOMY N/A 08/20/2013   Procedure: EXPLORATORY LAPAROTOMY WITH  SMALL BOWEL RESECTION;  Surgeon: Rolm Bookbinder, MD;  Location[facility];  Service: General;  Laterality: N/A;    Family History  Problem Relation Age of Onset  . Aneurysm Mother     Social History:  reports that he has never smoked. He has never used smokeless tobacco. He reports that he does not drink alcohol or use  drugs.  Allergies: No Known Allergies  Medications reviewed.    ROS Full ROS performed and is otherwise negative other than what is stated in HPI   BP 114/77   Pulse 76   Temp 98.1 F (36.7 C) (Temporal)   Resp 18   Ht 5\' 11"  (1.803 m)   SpO2 96%   BMI 23.12 kg/m   Physical Exam Pulmonary:     Effort: Pulmonary effort is normal. No respiratory distress.     Breath sounds: No stridor.  Abdominal:     General: Abdomen is flat. There is no distension.     Palpations: Abdomen is soft. There is no mass.     Tenderness: There is no abdominal tenderness. There is no guarding or rebound.     Hernia: No hernia is present.     Comments: colostomy working w air  Skin:    General: Skin is warm and dry.  Neurological:     General: No focal deficit present.     Comments: Chronic neurological deficits, he is on a wheelchair, no new deficits  Psychiatric:        Behavior: Behavior normal.       Assessment/Plan:  Soft diarrhea from rectal pouch currently given his frailty and delicate state I do not think that any further Is indicated.  I do not think that he will survive anymore major operations or insults. D/W the family in  detail and they underrstand   Greater than 50% of the 15 minutes  visit was spent in counseling/coordination of care   Caroleen Hamman, MD [place] Surgeon

## 2018-09-25 ENCOUNTER — Telehealth: Payer: Self-pay | Admitting: *Deleted

## 2018-09-25 NOTE — Telephone Encounter (Signed)
error 

## 2018-09-26 NOTE — Telephone Encounter (Signed)
I have called patient back. No answer. I have left a message on answering machine. I have reached out to the billing team to determine if insurance was filed for DOS: 08/29/18. I will update when response obtained from billing team.

## 2019-10-29 ENCOUNTER — Emergency Department: Payer: Medicare (Managed Care)

## 2019-10-29 ENCOUNTER — Encounter: Payer: Self-pay | Admitting: Emergency Medicine

## 2019-10-29 ENCOUNTER — Observation Stay: Payer: Medicare (Managed Care)

## 2019-10-29 ENCOUNTER — Other Ambulatory Visit: Payer: Self-pay

## 2019-10-29 ENCOUNTER — Inpatient Hospital Stay
Admission: EM | Admit: 2019-10-29 | Discharge: 2019-11-04 | DRG: 871 | Disposition: A | Discharge status: Home / Self Care | Payer: Medicare (Managed Care) | Attending: Internal Medicine | Admitting: Internal Medicine

## 2019-10-29 DIAGNOSIS — L270 Generalized skin eruption due to drugs and medicaments taken internally: Secondary | ICD-10-CM | POA: Diagnosis not present

## 2019-10-29 DIAGNOSIS — I480 Paroxysmal atrial fibrillation: Secondary | ICD-10-CM | POA: Diagnosis present

## 2019-10-29 DIAGNOSIS — R627 Adult failure to thrive: Secondary | ICD-10-CM | POA: Diagnosis present

## 2019-10-29 DIAGNOSIS — Z7401 Bed confinement status: Secondary | ICD-10-CM

## 2019-10-29 DIAGNOSIS — N390 Urinary tract infection, site not specified: Secondary | ICD-10-CM | POA: Diagnosis present

## 2019-10-29 DIAGNOSIS — T68XXXA Hypothermia, initial encounter: Secondary | ICD-10-CM | POA: Diagnosis present

## 2019-10-29 DIAGNOSIS — Z20822 Contact with and (suspected) exposure to covid-19: Secondary | ICD-10-CM | POA: Diagnosis present

## 2019-10-29 DIAGNOSIS — K631 Perforation of intestine (nontraumatic): Secondary | ICD-10-CM | POA: Diagnosis present

## 2019-10-29 DIAGNOSIS — R569 Unspecified convulsions: Secondary | ICD-10-CM

## 2019-10-29 DIAGNOSIS — A419 Sepsis, unspecified organism: Secondary | ICD-10-CM | POA: Diagnosis not present

## 2019-10-29 DIAGNOSIS — E039 Hypothyroidism, unspecified: Secondary | ICD-10-CM | POA: Diagnosis not present

## 2019-10-29 DIAGNOSIS — R680 Hypothermia, not associated with low environmental temperature: Secondary | ICD-10-CM | POA: Diagnosis present

## 2019-10-29 DIAGNOSIS — G40909 Epilepsy, unspecified, not intractable, without status epilepticus: Secondary | ICD-10-CM | POA: Diagnosis present

## 2019-10-29 DIAGNOSIS — Z933 Colostomy status: Secondary | ICD-10-CM

## 2019-10-29 DIAGNOSIS — Z881 Allergy status to other antibiotic agents status: Secondary | ICD-10-CM

## 2019-10-29 DIAGNOSIS — K668 Other specified disorders of peritoneum: Secondary | ICD-10-CM | POA: Diagnosis present

## 2019-10-29 DIAGNOSIS — F0390 Unspecified dementia without behavioral disturbance: Secondary | ICD-10-CM | POA: Diagnosis present

## 2019-10-29 DIAGNOSIS — E872 Acidosis, unspecified: Secondary | ICD-10-CM | POA: Diagnosis present

## 2019-10-29 DIAGNOSIS — I4891 Unspecified atrial fibrillation: Secondary | ICD-10-CM | POA: Diagnosis present

## 2019-10-29 DIAGNOSIS — Z79899 Other long term (current) drug therapy: Secondary | ICD-10-CM

## 2019-10-29 DIAGNOSIS — T3695XA Adverse effect of unspecified systemic antibiotic, initial encounter: Secondary | ICD-10-CM | POA: Diagnosis not present

## 2019-10-29 DIAGNOSIS — Z66 Do not resuscitate: Secondary | ICD-10-CM | POA: Diagnosis present

## 2019-10-29 DIAGNOSIS — Z79890 Hormone replacement therapy (postmenopausal): Secondary | ICD-10-CM

## 2019-10-29 LAB — URINALYSIS, COMPLETE (UACMP) WITH MICROSCOPIC
Bacteria, UA: NONE SEEN
Bilirubin Urine: NEGATIVE
Glucose, UA: NEGATIVE mg/dL
Hgb urine dipstick: NEGATIVE
Ketones, ur: NEGATIVE mg/dL
Leukocytes,Ua: NEGATIVE
Nitrite: NEGATIVE
Protein, ur: 30 mg/dL — AB
Specific Gravity, Urine: 1.025 (ref 1.005–1.030)
Squamous Epithelial / HPF: NONE SEEN (ref 0–5)
pH: 7 (ref 5.0–8.0)

## 2019-10-29 LAB — RESPIRATORY PANEL BY RT PCR (FLU A&B, COVID)
Influenza A by PCR: NEGATIVE
Influenza B by PCR: NEGATIVE
SARS Coronavirus 2 by RT PCR: NEGATIVE

## 2019-10-29 LAB — CBC
HCT: 35.8 % — ABNORMAL LOW (ref 39.0–52.0)
Hemoglobin: 11.6 g/dL — ABNORMAL LOW (ref 13.0–17.0)
MCH: 33.3 pg (ref 26.0–34.0)
MCHC: 32.4 g/dL (ref 30.0–36.0)
MCV: 102.9 fL — ABNORMAL HIGH (ref 80.0–100.0)
Platelets: 164 10*3/uL (ref 150–400)
RBC: 3.48 MIL/uL — ABNORMAL LOW (ref 4.22–5.81)
RDW: 12.6 % (ref 11.5–15.5)
WBC: 7.4 10*3/uL (ref 4.0–10.5)
nRBC: 0 % (ref 0.0–0.2)

## 2019-10-29 LAB — VALPROIC ACID LEVEL: Valproic Acid Lvl: <10 ug/mL — ABNORMAL LOW (ref 50.0–100.0)

## 2019-10-29 LAB — BASIC METABOLIC PANEL
Anion gap: 11 (ref 5–15)
BUN: 31 mg/dL — ABNORMAL HIGH (ref 8–23)
CO2: 27 mmol/L (ref 22–32)
Calcium: 9.3 mg/dL (ref 8.9–10.3)
Chloride: 101 mmol/L (ref 98–111)
Creatinine, Ser: 0.82 mg/dL (ref 0.61–1.24)
GFR calc Af Amer: >60 mL/min (ref >60)
GFR calc non Af Amer: >60 mL/min (ref >60)
Glucose, Bld: 92 mg/dL (ref 70–99)
Potassium: 4.5 mmol/L (ref 3.5–5.1)
Sodium: 139 mmol/L (ref 135–145)

## 2019-10-29 LAB — LACTIC ACID, PLASMA
Lactic Acid, Venous: 4 mmol/L (ref 0.5–1.9)
Lactic Acid, Venous: 2.5 mmol/L (ref 0.5–1.9)
Lactic Acid, Venous: 2.2 mmol/L (ref 0.5–1.9)
Lactic Acid, Venous: 4.1 mmol/L (ref 0.5–1.9)

## 2019-10-29 MED ORDER — ZINC OXIDE 40 % EX OINT
1.0000 "application " | TOPICAL_OINTMENT | CUTANEOUS | Status: DC
  Filled 2019-10-29: qty 113

## 2019-10-29 MED ORDER — SODIUM CHLORIDE 0.9 % IV BOLUS
1000.0000 mL | Freq: Once | INTRAVENOUS | Status: AC
  Administered 2019-10-29: 1000 mL via INTRAVENOUS

## 2019-10-29 MED ORDER — SODIUM CHLORIDE 0.9 % IV SOLN: INTRAVENOUS | Status: DC

## 2019-10-29 MED ORDER — ENSURE ENLIVE PO LIQD
237.0000 mL | Freq: Two times a day (BID) | ORAL | Status: DC
  Administered 2019-10-31 – 2019-11-04 (×8): 237 mL via ORAL

## 2019-10-29 MED ORDER — IOHEXOL 300 MG/ML  SOLN
100.0000 mL | Freq: Once | INTRAMUSCULAR | Status: AC | PRN
  Administered 2019-10-29: 100 mL via INTRAVENOUS

## 2019-10-29 MED ORDER — TRAZODONE HCL 50 MG PO TABS
50.0000 mg | ORAL_TABLET | Freq: Every day | ORAL | Status: DC
  Administered 2019-10-31 – 2019-11-03 (×4): 50 mg via ORAL
  Filled 2019-10-29 (×5): qty 1

## 2019-10-29 MED ORDER — VANCOMYCIN HCL IN DEXTROSE 1-5 GM/200ML-% IV SOLN
1000.0000 mg | Freq: Once | INTRAVENOUS | Status: AC
  Administered 2019-10-29: 1000 mg via INTRAVENOUS
  Filled 2019-10-29: qty 200

## 2019-10-29 MED ORDER — SODIUM CHLORIDE 0.9 % IV SOLN
2.0000 g | Freq: Once | INTRAVENOUS | Status: AC
  Administered 2019-10-29: 2 g via INTRAVENOUS
  Filled 2019-10-29: qty 2

## 2019-10-29 MED ORDER — LORAZEPAM 2 MG/ML IJ SOLN: 1.0000 mg | INTRAMUSCULAR | Status: DC | PRN

## 2019-10-29 MED ORDER — DIVALPROEX SODIUM 250 MG PO DR TAB
250.0000 mg | DELAYED_RELEASE_TABLET | Freq: Two times a day (BID) | ORAL | Status: DC
  Administered 2019-10-31 – 2019-11-04 (×8): 250 mg via ORAL
  Filled 2019-10-29 (×12): qty 1

## 2019-10-29 MED ORDER — VALPROATE SODIUM 500 MG/5ML IV SOLN
500.0000 mg | Freq: Once | INTRAVENOUS | Status: AC
  Administered 2019-10-29: 500 mg via INTRAVENOUS
  Filled 2019-10-29: qty 5

## 2019-10-29 MED ORDER — METOPROLOL SUCCINATE ER 25 MG PO TB24
25.0000 mg | ORAL_TABLET | Freq: Every day | ORAL | Status: DC
  Filled 2019-10-29: qty 1

## 2019-10-29 MED ORDER — METRONIDAZOLE IN NACL 5-0.79 MG/ML-% IV SOLN
500.0000 mg | Freq: Once | INTRAVENOUS | Status: AC
  Administered 2019-10-29: 500 mg via INTRAVENOUS
  Filled 2019-10-29: qty 100

## 2019-10-29 MED ORDER — SENNA 8.6 MG PO TABS
2.0000 | ORAL_TABLET | Freq: Every day | ORAL | Status: DC
  Administered 2019-10-31: 23:00:00 17.2 mg via ORAL
  Filled 2019-10-29 (×5): qty 2

## 2019-10-29 MED ORDER — ONDANSETRON HCL 4 MG/2ML IJ SOLN: 4.0000 mg | Freq: Three times a day (TID) | INTRAMUSCULAR | Status: DC | PRN

## 2019-10-29 MED ORDER — SIMETHICONE 80 MG PO CHEW
80.0000 mg | CHEWABLE_TABLET | Freq: Four times a day (QID) | ORAL | Status: DC | PRN
  Filled 2019-10-29: qty 1

## 2019-10-29 MED ORDER — BISACODYL 10 MG RE SUPP
10.0000 mg | Freq: Every day | RECTAL | Status: DC | PRN
  Filled 2019-10-29: qty 1

## 2019-10-29 MED ORDER — IOHEXOL 9 MG/ML PO SOLN: 500.0000 mL | ORAL | Status: AC

## 2019-10-29 MED ORDER — LEVOTHYROXINE SODIUM 88 MCG PO TABS
88.0000 ug | ORAL_TABLET | ORAL | Status: DC
  Filled 2019-10-29: qty 1

## 2019-10-29 MED ORDER — ENOXAPARIN SODIUM 40 MG/0.4ML ~~LOC~~ SOLN: 40.0000 mg | SUBCUTANEOUS | Status: DC

## 2019-10-29 MED ORDER — DIVALPROEX SODIUM 250 MG PO DR TAB: 250.0000 mg | DELAYED_RELEASE_TABLET | Freq: Once | ORAL | Status: DC

## 2019-10-29 MED ORDER — POLYETHYLENE GLYCOL 3350 17 G PO PACK
17.0000 g | PACK | Freq: Two times a day (BID) | ORAL | Status: DC
  Administered 2019-10-30 – 2019-11-04 (×6): 17 g via ORAL
  Filled 2019-10-29 (×9): qty 1

## 2019-10-29 MED ORDER — ACETAMINOPHEN 325 MG PO TABS
650.0000 mg | ORAL_TABLET | Freq: Four times a day (QID) | ORAL | Status: DC | PRN
  Administered 2019-11-03: 650 mg via ORAL
  Filled 2019-10-29: qty 2

## 2019-10-29 MED ORDER — LIDOCAINE HCL 4 % EX CREA
1.0000 "application " | TOPICAL_CREAM | Freq: Three times a day (TID) | CUTANEOUS | Status: DC
  Administered 2019-11-02 – 2019-11-03 (×2): 1 via CUTANEOUS
  Filled 2019-10-29 (×5): qty 15

## 2019-10-29 MED ORDER — LEVOTHYROXINE SODIUM 100 MCG PO TABS: 100.0000 ug | ORAL_TABLET | Freq: Every day | ORAL | Status: DC

## 2019-10-29 MED ORDER — DULOXETINE HCL 20 MG PO CPEP
20.0000 mg | ORAL_CAPSULE | Freq: Two times a day (BID) | ORAL | Status: DC
  Administered 2019-10-31 – 2019-11-04 (×7): 20 mg via ORAL
  Filled 2019-10-29 (×13): qty 1

## 2019-10-29 NOTE — ED Provider Notes (Addendum)
[facility] Emergency Department Provider Note  ____________________________________________   First MD Initiated Contact with Patient 10/29/19 1004     (approximate)  I have reviewed the triage vital signs and the nursing notes.   HISTORY  Chief Complaint Weakness    HPI Jason Harmon is a 79 y.o. male with past medical history of A. fib,  suspected dementia, here with seizure-like activity.  Patient reportedly has been somewhat less responsive over the last 2 days.  Family was with him today when he reportedly tensed up, rolled his eyes back, and became very stiff.  He was not responsive.  He then became minimally responsive and stopped stiffening, but remained confused for at least 30 minutes.  He had agonal respirations during this episode.  He has been slow to return to his baseline.  He reportedly has a history of seizures, though his wife has never seen him have 1.  He was just taken off his seizure medication several months ago.  Remainder of history limited due to his mental status.       Past Medical History:  Diagnosis Date  . A-fib ([place])   . Acute renal failure ([place])   . Ischemic bowel disease ([place])   . Renal disorder   . Sepsis [facility])     Patient Active Problem List   Diagnosis Date Noted  . Hypothyroidism 10/29/2019  . Seizure ([place]) 10/29/2019  . Lactic acidosis 10/29/2019  . Hypothermia 10/29/2019  . Pleural effusion 09/11/2013  . Palliative care encounter 09/03/2013  . 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 Hemoptysis 08/26/2013  . Bowel perforation ([place]) 08/18/2013  . Acute and chronic respiratory failure 08/18/2013  . Septic shock ([place]) 08/18/2013  . Pulmonary edema 08/12/2013  . Pneumonia 08/12/2013  . Acute respiratory failure ([place]) 08/12/2013  . Tracheostomy status ([place]) 08/12/2013  . Pneumonia, organism unspecified([place]) 08/12/2013  . Acute on chronic respiratory failure ([place])  08/09/2013  . Acute respiratory failure following trauma and surgery ([place]) 08/07/2013  . Atrial fibrillation ([place]) 08/07/2013  . 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 Hyperglycemia 08/07/2013  . Acute renal failure ([place]) 08/07/2013  . Acute blood loss anemia 08/07/2013  . Acute vascular insufficiency of intestine ([place]) 08/07/2013    Past Surgical History:  Procedure Laterality Date  . ABDOMINAL SURGERY    . COLOSTOMY N/A 08/20/2013   Procedure: COLOSTOMY;  Surgeon: Rolm Bookbinder, MD;  Location: [place];  Service: General;  Laterality: N/A;  . GASTROSTOMY N/A 08/20/2013   Procedure: G-TUBE PLACEMENT;  Surgeon: Rolm Bookbinder, MD;  Location: [place];  Service: General;  Laterality: N/A;  . LAPAROTOMY N/A 08/18/2013   Procedure: EXPLORATORY LAPAROTOMY;  Surgeon: Joyice Faster. Cornett, MD;  Location: [place];  Service: General;  Laterality: N/A;  . LAPAROTOMY N/A 08/20/2013   Procedure: EXPLORATORY LAPAROTOMY WITH  SMALL BOWEL RESECTION;  Surgeon: Rolm Bookbinder, MD;  Location: [place];  Service: General;  Laterality: N/A;    Prior to Admission medications   Medication Sig Start Date End Date Taking? Authorizing Provider  acetaminophen (TYLENOL) 650 MG CR tablet Take 650 mg by mouth every 8 (eight) hours as needed for pain.   Yes [provider]  bisacodyl (DULCOLAX) 10 MG suppository Place 10 mg rectally 2 (two) times a week.   Yes [provider]  DULoxetine (CYMBALTA) 20 MG capsule Take 20 mg by mouth 2 (two) times daily.   Yes [provider]  levothyroxine (SYNTHROID, LEVOTHROID) 100 MCG  tablet Take 100 mcg by mouth daily before breakfast. Sun Tue Thurs   Yes [provider]  Levothyroxine Sodium 88 MCG CAPS Take by mouth daily before breakfast. Mon Wed Fri and Sat   Yes [provider]  Lidocaine HCl (ASPERCREME LIDOCAINE) 4 % CREA Apply topically 3 (three) times daily.   Yes [provider]  liver oil-zinc oxide (DESITIN) 40  % ointment Apply 1 application topically See admin instructions. Apply to irritated areas of gluteal and peri-anal skin with each diaper change until healed   Yes [provider]  Metoprolol Succinate 25 MG CS24 Take 25 mg by mouth daily.    Yes [provider]  Nutritional Supplements (ENSURE ENLIVE PO) Take 237 mLs by mouth in the morning and at bedtime.    Yes [provider]  polyethylene glycol (MIRALAX / GLYCOLAX) packet Take 17 g by mouth 2 (two) times daily.   Yes [provider]  senna (SENOKOT) 8.6 MG TABS tablet Take 2 tablets by mouth at bedtime.   Yes [provider]  simethicone (MYLICON) 80 MG chewable tablet Chew 80 mg by mouth every 6 (six) hours as needed for flatulence.   Yes [provider]  traZODone (DESYREL) 50 MG tablet Take 50 mg by mouth at bedtime. Can also take 1/2 tablet if needed for insomnia   Yes [provider]    Allergies Patient has no known allergies.  Family History  Problem Relation Age of Onset  . Aneurysm Mother     Social History Social History   Tobacco Use  . Smoking status: Never Smoker  . Smokeless tobacco: Never Used  Substance Use Topics  . Alcohol use: No  . Drug use: No    Review of Systems  Review of Systems  Unable to perform ROS: Mental status change     ____________________________________________  PHYSICAL EXAM:      VITAL SIGNS: ED Triage Vitals  Enc Vitals Group     BP 10/29/19 0939 112/79     Pulse Rate 10/29/19 0939 (!) 111     Resp 10/29/19 0939 (!) 24     Temp 10/29/19 0939 (!) 97 F (36.1 C)     Temp Source 10/29/19 0939 Axillary     SpO2 10/29/19 0936 98 %     Weight 10/29/19 0940 140 lb (63.5 kg)     Height 10/29/19 0940 5\' 6"  (1.676 m)     Head Circumference --      Peak Flow --      Pain Score --      Pain Loc --      Pain Edu? --      Excl. in [place]? --      Physical Exam Vitals and nursing note reviewed.  Constitutional:       General: He is not in acute distress.    Appearance: He is well-developed.     Comments: Chronically ill-appearing  HENT:     Head: Normocephalic and atraumatic.     Mouth/Throat:     Mouth: Mucous membranes are dry.  Eyes:     Conjunctiva/sclera: Conjunctivae normal.  Cardiovascular:     Rate and Rhythm: Normal rate and regular rhythm.     Heart sounds: Normal heart sounds.  Pulmonary:     Effort: Pulmonary effort is normal. No respiratory distress.     Breath sounds: No wheezing.  Abdominal:     General: There is no distension.  Musculoskeletal:  Cervical back: Neck supple.  Skin:    General: Skin is warm.     Capillary Refill: Capillary refill takes less than 2 seconds.     Findings: No rash.  Neurological:     Mental Status: He is alert. He is disoriented.     Motor: No abnormal muscle tone.     Comments: Agitated with any examination, which is reportedly baseline.        ____________________________________________   LABS (all labs ordered are listed, but only abnormal results are displayed)  Labs Reviewed  BASIC METABOLIC PANEL - Abnormal; Notable for the following components:      Result Value   BUN 31 (*)    All other components within normal limits  CBC - Abnormal; Notable for the following components:   RBC 3.48 (*)    Hemoglobin 11.6 (*)    HCT 35.8 (*)    MCV 102.9 (*)    All other components within normal limits  URINALYSIS, COMPLETE (UACMP) WITH MICROSCOPIC - Abnormal; Notable for the following components:   Color, Urine YELLOW (*)    APPearance CLOUDY (*)    Protein, ur 30 (*)    All other components within normal limits  VALPROIC ACID LEVEL - Abnormal; Notable for the following components:   Valproic Acid Lvl <10 (*)    All other components within normal limits  LACTIC ACID, PLASMA - Abnormal; Notable for the following components:   Lactic Acid, Venous 4.0 (*)    All other components within normal limits  LACTIC ACID, PLASMA - Abnormal;  Notable for the following components:   Lactic Acid, Venous 2.5 (*)    All other components within normal limits  LACTIC ACID, PLASMA - Abnormal; Notable for the following components:   Lactic Acid, Venous 2.2 (*)    All other components within normal limits  RESPIRATORY PANEL BY RT PCR (FLU A&B, COVID)  CULTURE, BLOOD (ROUTINE X 2)  CULTURE, BLOOD (ROUTINE X 2)  URINE CULTURE  LACTIC ACID, PLASMA  BASIC METABOLIC PANEL  CBC    ____________________________________________  EKG: Normal sinus rhythm, VR 98. QRS 99, QTc 458. LAE. Moderate baseline artifact noted but no overt ST elevations or depressions. ________________________________________  RADIOLOGY All imaging, including plain films, CT scans, and ultrasounds, independently reviewed by me, and interpretations confirmed via formal radiology reads.  ED MD interpretation:   CXR: Unremarkable CT Head: [place]  Official radiology report(s): DG Chest 1 View  Result Date: 10/29/2019 CLINICAL DATA:  Altered mental status, episode of gazing and purple lips, history atrial fibrillation EXAM: CHEST  1 VIEW COMPARISON:  10/15/2013 FINDINGS: Rotated to the LEFT. Normal heart size, mediastinal contours, and pulmonary vascularity. Lungs grossly clear. No infiltrate, pleural effusion or pneumothorax. Osseous structures unremarkable. IMPRESSION: No acute abnormalities. Electronically Signed   By: Lavonia Dana M.D.   On: 10/29/2019 10:59   CT Head Wo Contrast  Result Date: 10/29/2019 CLINICAL DATA:  Cephalopathy.  Patient nonverbal. EXAM: CT HEAD WITHOUT CONTRAST TECHNIQUE: Contiguous axial images were obtained from the base of the skull through the vertex without intravenous contrast. Best images obtainable with all immobilization techniques utilized. Patient combative. COMPARISON:  05/05/2014 FINDINGS: Moderate motion artifact is present despite multiple scanning attempts making it difficult to exclude acute subarachnoid hemorrhage. Brain: There  is mild age related atrophic change in mild chronic ischemic microvascular disease. There is no mass, mass effect, shift of midline structures or acute infarction. No definite parenchymal hemorrhage or subdural collection. Vascular: No hyperdense vessel  or unexpected calcification. Skull: Normal. Negative for fracture or focal lesion. Sinuses/Orbits: No acute finding. Other: None. IMPRESSION: 1. No acute findings, although moderate motion artifact is present as described above. 2. Mild age related atrophy and chronic ischemic microvascular disease. Electronically Signed   By: Marin Olp M.D.   On: 10/29/2019 10:51    ____________________________________________  PROCEDURES   Procedure(s) performed (including Critical Care):  .Critical Care Performed by: Duffy Bruce, MD Authorized by: Duffy Bruce, MD   Critical care provider statement:    Critical care time (minutes):  35   Critical care time was exclusive of:  Separately billable procedures and treating other patients and teaching time   Critical care was necessary to treat or prevent imminent or life-threatening deterioration of the following conditions:  Circulatory failure, dehydration, cardiac failure and sepsis   Critical care was time spent personally by me on the following activities:  Development of treatment plan with patient or surrogate, discussions with consultants, evaluation of patient's response to treatment, examination of patient, obtaining history from patient or surrogate, ordering and performing treatments and interventions, ordering and review of laboratory studies, ordering and review of radiographic studies, pulse oximetry, re-evaluation of patient's condition and review of old charts   I assumed direction of critical care for this patient from another provider in my specialty: no   .1-3 Lead EKG Interpretation Performed by: Duffy Bruce, MD Authorized by: Duffy Bruce, MD     Interpretation: normal      ECG rate:  90-100s   ECG rate assessment: normal     Rhythm: sinus rhythm     Ectopy: none     Conduction: normal   Comments:     Indication: Sepsis    ____________________________________________  INITIAL IMPRESSION / MDM / ASSESSMENT AND PLAN / ED COURSE  As part of my medical decision making, I reviewed the following data within the [place] notes reviewed and incorporated, Old chart reviewed, Notes from prior ED visits, and [place] Controlled Substance Database       *Jason Harmon was evaluated in Emergency Department on 10/29/2019 for the symptoms described in the history of present illness. He was evaluated in the context of the global COVID-19 pandemic, which necessitated consideration that the patient might be at risk for infection with the SARS-CoV-2 virus that causes COVID-19. Institutional protocols and algorithms that pertain to the evaluation of patients at risk for COVID-19 are in a state of rapid change based on information released by regulatory bodies including the CDC and federal and state organizations. These policies and algorithms were followed during the patient's care in the ED.  Some ED evaluations and interventions may be delayed as a result of limited staffing during the pandemic.*     Medical Decision Making:  79 yo M with PMHx ischemic bowel disease, renal disorder, sepsis, here with episode of minimal responsiveness and turning blue at home. Clinically, suspect pt had a witnessed seizure. He was previously on Depakote for this and was just taken off by his PCP. No evidence of ongoing seizure activity clinically. EKG nonischemic, no signs of arrhythmia or PE. Labs reviewed and do show significant lactic acidosis. I suspect this is due to his recent seizure activity, but given mild tachycardia, will stat empiric ABC and sepsis protocol with 30 cc/kg fluid. UA does show pyuria, hematuria and wife reports that pt has had dysuria at home. Will  treat for possible sepsis 2/2 UTI causing breakthrough seizure. If LA remains elevated,  consider abd imaging though abdomen is soft, NT, ND on my examination.  ____________________________________________  FINAL CLINICAL IMPRESSION(S) / ED DIAGNOSES  Final diagnoses:  Sepsis without acute organ dysfunction, due to unspecified organism ([place])  Seizure-like activity ([place])     MEDICATIONS GIVEN DURING THIS VISIT:  Medications  LORazepam (ATIVAN) injection 1 mg (has no administration in time range)  ondansetron (ZOFRAN) injection 4 mg (has no administration in time range)  acetaminophen (TYLENOL) tablet 650 mg (has no administration in time range)  0.9 %  sodium chloride infusion ( Intravenous Rate/Dose Verify 10/29/19 1953)  metoprolol succinate (TOPROL-XL) 24 hr tablet 25 mg (has no administration in time range)  DULoxetine (CYMBALTA) DR capsule 20 mg (has no administration in time range)  traZODone (DESYREL) tablet 50 mg (has no administration in time range)  levothyroxine (SYNTHROID) tablet 100 mcg (has no administration in time range)  Levothyroxine Sodium CAPS (has no administration in time range)  bisacodyl (DULCOLAX) suppository 10 mg (has no administration in time range)  polyethylene glycol (MIRALAX / GLYCOLAX) packet 17 g (has no administration in time range)  senna (SENOKOT) tablet 17.2 mg (has no administration in time range)  simethicone (MYLICON) chewable tablet 80 mg (has no administration in time range)  feeding supplement (ENSURE ENLIVE) (ENSURE ENLIVE) liquid 237 mL (has no administration in time range)  Lidocaine HCl 4 % CREA 1 application (has no administration in time range)  liver oil-zinc oxide (DESITIN) 40 % ointment 1 application (has no administration in time range)  divalproex (DEPAKOTE) DR tablet 250 mg (has no administration in time range)  enoxaparin (LOVENOX) injection 40 mg (has no administration in time range)  sodium chloride 0.9 % bolus 1,000 mL (0 mLs  Intravenous Stopped 10/29/19 1438)  sodium chloride 0.9 % bolus 1,000 mL (0 mLs Intravenous Stopped 10/29/19 1510)  ceFEPIme (MAXIPIME) 2 g in sodium chloride 0.9 % 100 mL IVPB (0 g Intravenous Stopped 10/29/19 1320)  metroNIDAZOLE (FLAGYL) IVPB 500 mg (0 mg Intravenous Stopped 10/29/19 1334)  vancomycin (VANCOCIN) IVPB 1000 mg/200 mL premix ( Intravenous Stopped 10/29/19 1427)  sodium chloride 0.9 % bolus 1,000 mL (0 mLs Intravenous Stopped 10/29/19 1640)  valproate (DEPACON) 500 mg in dextrose 5 % 50 mL IVPB (0 mg Intravenous Stopped 10/29/19 1535)     ED Discharge Orders    None       Note:  This document was prepared using Dragon voice recognition software and may include unintentional dictation errors.   Duffy Bruce, MD 10/29/19 2019    Duffy Bruce, MD 10/29/19 Pollie Meyer    Duffy Bruce, MD 10/29/19 2101

## 2019-10-29 NOTE — ED Notes (Signed)
This RN to bedside, spoke with patient's wife regarding admission and patient having assigned bed. Pt visualized in NAD at this time, resting in bed. VSS at this time.

## 2019-10-29 NOTE — ED Notes (Signed)
This RN, Nira Conn, RN, and Lonn Georgia, EDT at bedside to initiate 2nd IV and draw blood cultures. In and out cath performed by this RN and above mentioned staff. Condom cath placed at this time, pt's brief noted to be clean and dry. Pt tolerated, continues to be combative with staff while staff interacting with patient. Abx initiated by this RN after blood cultures and urine collected by this RN. Pt's wife back to bedside after staff done with IV and urine. Pt continues to rest in bed. Lights dimmed for patient comfort.

## 2019-10-29 NOTE — ED Notes (Signed)
Pt continues to rest in bed with wife at bedside, pt intermittently fidgeting with sheets. Pt's wife remains at bedside with lights dimmed for patient comfort. Pt's wife denies any needs.

## 2019-10-29 NOTE — ED Notes (Signed)
Pharmacy called by pharmacy tech regarding patient's meds.

## 2019-10-29 NOTE — ED Triage Notes (Addendum)
Pt arrived via [facility] from home. Per EMS the patient's caregiver was getting ready to give him a bath when his eyes started to gaze and his lips turned purple which lasted for an unknown time.  Per EMS on arrival, pt is now at his baseline.  Pt is non-verbal, but when touched or moved yells out and is combative.  Per EMS pt went to [facility] program yesterday and was at baseline.

## 2019-10-29 NOTE — ED Notes (Signed)
Message sent to pharmacy to verify admission medications.

## 2019-10-29 NOTE — H&P (Signed)
History and Physical    Jason Harmon [id number] DOB: [birth year] DOA: 10/29/2019  Referring MD/NP/PA:   PCP: System, Pcp Not In   Patient coming from:  The patient is coming from home.  At baseline, pt is dependent for most of ADL.        Chief Complaint: Seizure-like activity  HPI: Jason Harmon is a 79 y.o. male with medical history significant of seizure off medications in the past 2 months, hypothyroidism, atrial fibrillation not on anticoagulants, ischemic bowel with perforation (laparotomy, colostomy and gastrotomy), dementia, bedbound, cannot walk, who presents with seizure-like activity.  Per his wife, patient had history of seizure, but his seizure medication Depakote was discontinued 2 months ago by PCP since patient did not have seizure for long time.  Wife state that pt has been somewhat less responsive in the last 2 days. Today he had an episode of unresponsiveness, rolled his eyes back, and became very stiff. After that he remained confused for at least 30 minutes.  He had agonal respirations during this episode.  He has been slow to return to his baseline.  Patient does not have chest pain, shortness of breath, cough, fever, chills.  No nausea, vomiting, diarrhea, abdominal pain, symptoms of UTI.  He is bedbound, cannot walk for long time.  ED Course: pt was found to have WBC 7.4, negative COVID-19 PCR, lactic acid 4.0, valproic acid level less than 10, urinalysis (cloudy appearance, negative leukocyte, negative bacteria, WBC 6-10), electrolytes renal function okay, temperature 97, blood pressure 112/79, tachycardia, tachypnea, oxygen saturation 98% on room air.  Chest x-ray negative.  CT head negative.  Patient is placed on progressive bed for observation.  Review of Systems: Could not be reviewed due to dementia   Allergy: No Known Allergies  Past Medical History:  Diagnosis Date  . A-fib ([place])   . Acute renal failure ([place])   . Ischemic bowel disease ([place])   .  Renal disorder   . Sepsis [facility])     Past Surgical History:  Procedure Laterality Date  . ABDOMINAL SURGERY    . COLOSTOMY N/A 08/20/2013   Procedure: COLOSTOMY;  Surgeon: Rolm Bookbinder, MD;  Location: [place];  Service: General;  Laterality: N/A;  . GASTROSTOMY N/A 08/20/2013   Procedure: G-TUBE PLACEMENT;  Surgeon: Rolm Bookbinder, MD;  Location: [place];  Service: General;  Laterality: N/A;  . LAPAROTOMY N/A 08/18/2013   Procedure: EXPLORATORY LAPAROTOMY;  Surgeon: Joyice Faster. Cornett, MD;  Location: [place];  Service: General;  Laterality: N/A;  . LAPAROTOMY N/A 08/20/2013   Procedure: EXPLORATORY LAPAROTOMY WITH  SMALL BOWEL RESECTION;  Surgeon: Rolm Bookbinder, MD;  Location: [place];  Service: General;  Laterality: N/A;    Social History:  reports that he has never smoked. He has never used smokeless tobacco. He reports that he does not drink alcohol or use drugs.  Family History:  Family History  Problem Relation Age of Onset  . Aneurysm Mother      Prior to Admission medications   Medication Sig Start Date End Date Taking? Authorizing Provider  divalproex (DEPAKOTE SPRINKLE) 125 MG capsule Take by mouth 2 (two) times daily. 2 tab am and 4 tab bedtime    [provider]  levothyroxine (SYNTHROID, LEVOTHROID) 100 MCG tablet Take 100 mcg by mouth daily before breakfast. [place] Thurs    [provider]  Levothyroxine Sodium 88 MCG CAPS Take by mouth daily before breakfast. Mon Wed Fri and Sat    [provider]  liver oil-zinc oxide (DESITIN) 40 % ointment Apply 1 application topically as needed for irritation.    [provider]  Metoprolol Succinate 25 MG CS24 Take by mouth daily.    [provider]  Nutritional Supplements (ENSURE ENLIVE PO) Take by mouth 3 (three) times daily.    [provider]  polyethylene glycol (MIRALAX / GLYCOLAX) packet Take 17 g by mouth 2 (two) times daily.    [provider]    Physical  Exam: Vitals:   10/29/19 1400 10/29/19 1500 10/29/19 1530 10/29/19 1601  BP: 129/80 (!) 147/82 128/86 (!) 148/75  Pulse: 96 (!) 101 (!) 111 (!) 104  Resp: 13 18 20 15   Temp:      TempSrc:      SpO2: 99% 99% 100% 99%  Weight:      Height:       General: Not in acute distress HEENT:       Eyes: PERRL, EOMI, no scleral icterus.       ENT: No discharge from the ears and nose, no pharynx injection, no tonsillar enlargement.        Neck: No JVD, no bruit, no mass felt. Heme: No neck lymph node enlargement. Cardiac: S1/S2, RRR, No murmurs, No gallops or rubs. Respiratory: No rales, wheezing, rhonchi or rubs. GI: Soft, nondistended, nontender, no organomegaly, BS present. S/p of colostomy. GU: No hematuria Ext: No pitting leg edema bilaterally. 2+DP/PT pulse bilaterally. Musculoskeletal: No joint deformities, No joint redness or warmth, no limitation of ROM in spin. Skin: No rashes.  Neuro: Alert, confused, not following command, cranial nerves II-XII grossly intact, legs are clinched together. Psych: Patient is not psychotic, no suicidal or hemocidal ideation.  Labs on Admission: I have personally reviewed following labs and imaging studies  CBC: Recent Labs  Lab 10/29/19 0945  WBC 7.4  HGB 11.6*  HCT 35.8*  MCV 102.9*  PLT [id number]   Basic Metabolic Panel: Recent Labs  Lab 10/29/19 0945  NA 139  K 4.5  CL 101  CO2 27  GLUCOSE 92  BUN 31*  CREATININE 0.82  CALCIUM 9.3   GFR: Estimated Creatinine Clearance: 68.8 mL/min (by C-G formula based on SCr of 0.82 mg/dL). Liver Function Tests: No results for input(s): AST, ALT, ALKPHOS, BILITOT, PROT, ALBUMIN in the last 168 hours. No results for input(s): LIPASE, AMYLASE in the last 168 hours. No results for input(s): AMMONIA in the last 168 hours. Coagulation Profile: No results for input(s): INR, PROTIME in the last 168 hours. Cardiac Enzymes: No results for input(s): CKTOTAL, CKMB, CKMBINDEX, TROPONINI in the last 168  hours. BNP (last 3 results) No results for input(s): PROBNP in the last 8760 hours. HbA1C: No results for input(s): HGBA1C in the last 72 hours. CBG: No results for input(s): GLUCAP in the last 168 hours. Lipid Profile: No results for input(s): CHOL, HDL, LDLCALC, TRIG, CHOLHDL, LDLDIRECT in the last 72 hours. Thyroid Function Tests: No results for input(s): TSH, T4TOTAL, FREET4, T3FREE, THYROIDAB in the last 72 hours. Anemia Panel: No results for input(s): VITAMINB12, FOLATE, FERRITIN, TIBC, IRON, RETICCTPCT in the last 72 hours. Urine analysis:    Component Value Date/Time   COLORURINE YELLOW (A) 10/29/2019 1225   APPEARANCEUR CLOUDY (A) 10/29/2019 1225   APPEARANCEUR Clear 03/15/2014 1331   LABSPEC 1.025 10/29/2019 1225   LABSPEC 1.017 03/15/2014 1331   PHURINE 7.0 10/29/2019 1225   GLUCOSEU NEGATIVE 10/29/2019 1225   GLUCOSEU 150 mg/dL 03/15/2014 1331   HGBUR NEGATIVE 10/29/2019 1225  BILIRUBINUR NEGATIVE 10/29/2019 1225   BILIRUBINUR Negative 03/15/2014 1331   KETONESUR NEGATIVE 10/29/2019 1225   PROTEINUR 30 (A) 10/29/2019 1225   UROBILINOGEN 0.2 10/18/2013 1356   NITRITE NEGATIVE 10/29/2019 1225   LEUKOCYTESUR NEGATIVE 10/29/2019 1225   LEUKOCYTESUR Negative 03/15/2014 1331   Sepsis Labs: @LABRCNTIP (procalcitonin:4,lacticidven:4) ) Recent Results (from the past 240 hour(s))  Respiratory Panel by RT PCR (Flu A&B, Covid) - Nasopharyngeal Swab     Status: None   Collection Time: 10/29/19  1:58 PM   Specimen: Nasopharyngeal Swab  Result Value Ref Range Status   SARS Coronavirus 2 by RT PCR NEGATIVE NEGATIVE Final    Comment: (NOTE) SARS-CoV-2 target nucleic acids are NOT DETECTED. The SARS-CoV-2 RNA is generally detectable in upper respiratoy specimens during the acute phase of infection. The lowest concentration of SARS-CoV-2 viral copies this assay can detect is 131 copies/mL. A negative result does not preclude SARS-Cov-2 infection and should not be used as the  sole basis for treatment or other patient management decisions. A negative result may occur with  improper specimen collection/handling, submission of specimen other than nasopharyngeal swab, presence of viral mutation(s) within the areas targeted by this assay, and inadequate number of viral copies (<131 copies/mL). A negative result must be combined with clinical observations, patient history, and epidemiological information. The expected result is Negative. Fact Sheet for Patients:  PinkCheek.be Fact Sheet for Healthcare Providers:  GravelBags.it This test is not yet ap proved or cleared by the [place] FDA and  has been authorized for detection and/or diagnosis of SARS-CoV-2 by FDA under an Emergency Use Authorization (EUA). This EUA will remain  in effect (meaning this test can be used) for the duration of the COVID-19 declaration under Section 564(b)(1) of the Act, 21 U.S.C. section 360bbb-3(b)(1), unless the authorization is terminated or revoked sooner.    Influenza A by PCR NEGATIVE NEGATIVE Final   Influenza B by PCR NEGATIVE NEGATIVE Final    Comment: (NOTE) The Xpert Xpress SARS-CoV-2/FLU/RSV assay is intended as an aid in  the diagnosis of influenza from Nasopharyngeal swab specimens and  should not be used as a sole basis for treatment. Nasal washings and  aspirates are unacceptable for Xpert Xpress SARS-CoV-2/FLU/RSV  testing. Fact Sheet for Patients: PinkCheek.be Fact Sheet for Healthcare Providers: GravelBags.it This test is not yet approved or cleared by the [place] FDA and  has been authorized for detection and/or diagnosis of SARS-CoV-2 by  FDA under an Emergency Use Authorization (EUA). This EUA will remain  in effect (meaning this test can be used) for the duration of the  Covid-19 declaration under Section 564(b)(1) of the Act, 21   U.S.C. section 360bbb-3(b)(1), unless the authorization is  terminated or revoked. Performed at [facility], [address] [place], [place] [address]      Radiological Exams on Admission: DG Chest 1 View  Result Date: 10/29/2019 CLINICAL DATA:  Altered mental status, episode of gazing and purple lips, history atrial fibrillation EXAM: CHEST  1 VIEW COMPARISON:  10/15/2013 FINDINGS: Rotated to the LEFT. Normal heart size, mediastinal contours, and pulmonary vascularity. Lungs grossly clear. No infiltrate, pleural effusion or pneumothorax. Osseous structures unremarkable. IMPRESSION: No acute abnormalities. Electronically Signed   By: Lavonia Dana M.D.   On: 10/29/2019 10:59   CT Head Wo Contrast  Result Date: 10/29/2019 CLINICAL DATA:  Cephalopathy.  Patient nonverbal. EXAM: CT HEAD WITHOUT CONTRAST TECHNIQUE: Contiguous axial images were obtained from the base of the skull through the vertex  without intravenous contrast. Best images obtainable with all immobilization techniques utilized. Patient combative. COMPARISON:  05/05/2014 FINDINGS: Moderate motion artifact is present despite multiple scanning attempts making it difficult to exclude acute subarachnoid hemorrhage. Brain: There is mild age related atrophic change in mild chronic ischemic microvascular disease. There is no mass, mass effect, shift of midline structures or acute infarction. No definite parenchymal hemorrhage or subdural collection. Vascular: No hyperdense vessel or unexpected calcification. Skull: Normal. Negative for fracture or focal lesion. Sinuses/Orbits: No acute finding. Other: None. IMPRESSION: 1. No acute findings, although moderate motion artifact is present as described above. 2. Mild age related atrophy and chronic ischemic microvascular disease. Electronically Signed   By: Marin Olp M.D.   On: 10/29/2019 10:51     EKG: Independently reviewed.  Sinus rhythm, QTC 458, poor quality of EKG  strips  Assessment/Plan Principal Problem:   Seizure (HCC) Active Problems:   Atrial fibrillation (HCC)   Hypothyroidism   Lactic acidosis   Hypothermia   Possible seizure [facility]): Patient symptoms are most likely due to seizure.  Patient was recently taken off seizure medications.  CT head is negative for acute intracranial abnormalities.  ED physician discussed with neurologist, Dr. Doy Mince, who recommended to restart seizure medications.  -Place to progressive bed for observation -Seizure precaution -When necessary Ativan for seizure - restart Depakote 250 mg twice daily (loaded 500 mg by IV) -check Keppra level  Atrial fibrillation ([place]): Not on anticoagulants at home -Continue metoprolol  Hypothyroidism -Synthroid  Lactic acidosis: Lactic acid 4.0, 2.5.  Patient received 1 dose of cefepime, Flagyl and vancomycin in ED.  Urinalysis not impressive.  Chest x-ray negative.  No leukocytosis.  Low suspicious for sepsis.  Possibly due to seizure -IV fluid: 3 L normal saline, followed by 75 cc/h -Trend lactic acid -Follow-up blood culture and urine culture  Hypothermia: Mild, temperature 97 -blankets       DVT ppx:SQ Lovenox Code Status: Full code Family Communication: Yes, patient's wife    at bed side Disposition Plan:  Anticipate discharge back to previous home environment Consults called: Not formally consulted, but ED physician discussed with neurologist, Dr. Doy Mince Admission status: progressive unit as inpt  Date of Service 10/29/2019    [place] [facility]   If 7PM-7AM, please contact night-coverage [email or web address] 10/29/2019, 6:40 PM

## 2019-10-29 NOTE — ED Notes (Signed)
Wife at bedside, daughter Levada Dy updated via telephone re: patient's status. [phone]

## 2019-10-29 NOTE — Progress Notes (Signed)
CODE SEPSIS - PHARMACY COMMUNICATION  **Broad Spectrum Antibiotics should be administered within 1 hour of Sepsis diagnosis**  Time Code Sepsis Called/Page Received: 1141  Antibiotics Ordered: cefepime, Flagyl, vancomycin   Time of 1st antibiotic administration: @ 1229  Additional action taken by pharmacy: Spoke with RN @ 1218 about time left for Abx to start for Code Sepsis   If necessary, Name of Provider/Nurse Contacted: Megan, RN   Pernell Dupre, PharmD, BCPS Clinical Pharmacist 10/29/2019 11:46 AM

## 2019-10-29 NOTE — Progress Notes (Signed)
Refuses oral contrast. Notified Sharion Settler NP.

## 2019-10-29 NOTE — Consult Note (Signed)
PHARMACY -  BRIEF ANTIBIOTIC NOTE   Pharmacy has received consult(s) for  Cefepime and vancomycin from an ED provider.  The patient's profile has been reviewed for ht/wt/allergies/indication/available labs.    One time order(s) placed for cefepime 2g and vancomycin 1000mg   Further antibiotics/pharmacy consults should be ordered by admitting physician if indicated.                       Thank you, Pernell Dupre, PharmD, BCPS Clinical Pharmacist 10/29/2019 11:46 AM

## 2019-10-29 NOTE — ED Notes (Signed)
Date and time results received: 10/29/19 11:38 AM  (use smartphrase ".now" to insert current time)  Test: Lactic Critical Value: 4.0  Name of Provider Notified: Duffy Bruce  Orders Received? Or Actions Taken?: Critical results acknowledged

## 2019-10-29 NOTE — ED Notes (Signed)
Pt continues resting in bed with wife at bedside. No change in patient condition. Pt's wife updated regarding admission process. Pt resting in bed with eyes closed at this time. Lights remain dimmed for patient comfort and to decrease stimuli/agitation. Pt's wife provided with warm blanket per her request, denies further needs.

## 2019-10-30 DIAGNOSIS — K631 Perforation of intestine (nontraumatic): Secondary | ICD-10-CM | POA: Diagnosis present

## 2019-10-30 DIAGNOSIS — R627 Adult failure to thrive: Secondary | ICD-10-CM | POA: Diagnosis present

## 2019-10-30 DIAGNOSIS — T3695XA Adverse effect of unspecified systemic antibiotic, initial encounter: Secondary | ICD-10-CM | POA: Diagnosis not present

## 2019-10-30 DIAGNOSIS — R21 Rash and other nonspecific skin eruption: Secondary | ICD-10-CM | POA: Diagnosis not present

## 2019-10-30 DIAGNOSIS — Z7401 Bed confinement status: Secondary | ICD-10-CM | POA: Diagnosis not present

## 2019-10-30 DIAGNOSIS — K668 Other specified disorders of peritoneum: Secondary | ICD-10-CM | POA: Diagnosis present

## 2019-10-30 DIAGNOSIS — F0390 Unspecified dementia without behavioral disturbance: Secondary | ICD-10-CM | POA: Diagnosis present

## 2019-10-30 DIAGNOSIS — A419 Sepsis, unspecified organism: Secondary | ICD-10-CM | POA: Diagnosis present

## 2019-10-30 DIAGNOSIS — R680 Hypothermia, not associated with low environmental temperature: Secondary | ICD-10-CM | POA: Diagnosis present

## 2019-10-30 DIAGNOSIS — N390 Urinary tract infection, site not specified: Secondary | ICD-10-CM | POA: Diagnosis present

## 2019-10-30 DIAGNOSIS — Z79899 Other long term (current) drug therapy: Secondary | ICD-10-CM | POA: Diagnosis not present

## 2019-10-30 DIAGNOSIS — Z881 Allergy status to other antibiotic agents status: Secondary | ICD-10-CM | POA: Diagnosis not present

## 2019-10-30 DIAGNOSIS — E039 Hypothyroidism, unspecified: Secondary | ICD-10-CM | POA: Diagnosis present

## 2019-10-30 DIAGNOSIS — L270 Generalized skin eruption due to drugs and medicaments taken internally: Secondary | ICD-10-CM | POA: Diagnosis not present

## 2019-10-30 DIAGNOSIS — G40909 Epilepsy, unspecified, not intractable, without status epilepticus: Secondary | ICD-10-CM | POA: Diagnosis present

## 2019-10-30 DIAGNOSIS — I480 Paroxysmal atrial fibrillation: Secondary | ICD-10-CM | POA: Diagnosis present

## 2019-10-30 DIAGNOSIS — Z20822 Contact with and (suspected) exposure to covid-19: Secondary | ICD-10-CM | POA: Diagnosis present

## 2019-10-30 DIAGNOSIS — R569 Unspecified convulsions: Secondary | ICD-10-CM | POA: Diagnosis not present

## 2019-10-30 DIAGNOSIS — Z66 Do not resuscitate: Secondary | ICD-10-CM | POA: Diagnosis present

## 2019-10-30 DIAGNOSIS — Z79890 Hormone replacement therapy (postmenopausal): Secondary | ICD-10-CM | POA: Diagnosis not present

## 2019-10-30 DIAGNOSIS — Z933 Colostomy status: Secondary | ICD-10-CM | POA: Diagnosis not present

## 2019-10-30 LAB — DIFFERENTIAL
Abs Immature Granulocytes: 0.06 10*3/uL (ref 0.00–0.07)
Basophils Absolute: 0 10*3/uL (ref 0.0–0.1)
Basophils Relative: 0 %
Eosinophils Absolute: 0.1 10*3/uL (ref 0.0–0.5)
Eosinophils Relative: 1 %
Immature Granulocytes: 1 %
Lymphocytes Relative: 14 %
Lymphs Abs: 1.4 10*3/uL (ref 0.7–4.0)
Monocytes Absolute: 1.1 10*3/uL — ABNORMAL HIGH (ref 0.1–1.0)
Monocytes Relative: 12 %
Neutro Abs: 7.1 10*3/uL (ref 1.7–7.7)
Neutrophils Relative %: 72 %

## 2019-10-30 LAB — URINE CULTURE: Culture: NO GROWTH

## 2019-10-30 LAB — BASIC METABOLIC PANEL
Anion gap: 7 (ref 5–15)
BUN: 24 mg/dL — ABNORMAL HIGH (ref 8–23)
CO2: 25 mmol/L (ref 22–32)
Calcium: 8.2 mg/dL — ABNORMAL LOW (ref 8.9–10.3)
Chloride: 107 mmol/L (ref 98–111)
Creatinine, Ser: 0.95 mg/dL (ref 0.61–1.24)
GFR calc Af Amer: >60 mL/min (ref >60)
GFR calc non Af Amer: >60 mL/min (ref >60)
Glucose, Bld: 101 mg/dL — ABNORMAL HIGH (ref 70–99)
Potassium: 4.8 mmol/L (ref 3.5–5.1)
Sodium: 139 mmol/L (ref 135–145)

## 2019-10-30 LAB — CBC
HCT: 29.5 % — ABNORMAL LOW (ref 39.0–52.0)
Hemoglobin: 9.6 g/dL — ABNORMAL LOW (ref 13.0–17.0)
MCH: 33.6 pg (ref 26.0–34.0)
MCHC: 32.5 g/dL (ref 30.0–36.0)
MCV: 103.1 fL — ABNORMAL HIGH (ref 80.0–100.0)
Platelets: 147 10*3/uL — ABNORMAL LOW (ref 150–400)
RBC: 2.86 MIL/uL — ABNORMAL LOW (ref 4.22–5.81)
RDW: 12.8 % (ref 11.5–15.5)
WBC: 9.8 10*3/uL (ref 4.0–10.5)
nRBC: 0 % (ref 0.0–0.2)

## 2019-10-30 LAB — LACTIC ACID, PLASMA: Lactic Acid, Venous: 1.8 mmol/L (ref 0.5–1.9)

## 2019-10-30 MED ORDER — LEVOTHYROXINE SODIUM 88 MCG PO TABS
88.0000 ug | ORAL_TABLET | ORAL | Status: DC
  Administered 2019-11-02 – 2019-11-04 (×2): 88 ug via ORAL
  Filled 2019-10-30 (×4): qty 1

## 2019-10-30 MED ORDER — PIPERACILLIN-TAZOBACTAM 3.375 G IVPB
3.3750 g | Freq: Three times a day (TID) | INTRAVENOUS | Status: DC
  Administered 2019-10-30 – 2019-11-02 (×10): 3.375 g via INTRAVENOUS
  Filled 2019-10-30 (×10): qty 50

## 2019-10-30 MED ORDER — ENOXAPARIN SODIUM 40 MG/0.4ML ~~LOC~~ SOLN
40.0000 mg | SUBCUTANEOUS | Status: DC
  Administered 2019-10-30 – 2019-11-02 (×4): 40 mg via SUBCUTANEOUS
  Filled 2019-10-30 (×5): qty 0.4

## 2019-10-30 MED ORDER — METOPROLOL SUCCINATE ER 25 MG PO TB24: 25.0000 mg | ORAL_TABLET | Freq: Every day | ORAL | Status: DC

## 2019-10-30 MED ORDER — LEVOTHYROXINE SODIUM 100 MCG PO TABS
100.0000 ug | ORAL_TABLET | ORAL | Status: DC
  Administered 2019-11-03: 100 ug via ORAL
  Filled 2019-10-30 (×2): qty 1

## 2019-10-30 NOTE — Progress Notes (Addendum)
Subjective:  CC: Jason Harmon is a 79 y.o. male  Hospital stay day 0,   sepsis, pneumoperitoneum  HPI: No reported changes since few hours ago  ROS:  Unable to obtain secondary to mentation  Objective:   Temp:  [97 F (36.1 C)-98.1 F (36.7 C)] 97.7 F (36.5 C) (04/14 0406) Pulse Rate:  [91-111] 91 (04/14 0406) Resp:  [13-24] 19 (04/13 2038) BP: (90-148)/(58-91) 90/79 (04/14 0406) SpO2:  [97 %-100 %] 100 % (04/14 0406) Weight:  [63.5 kg] 63.5 kg (04/13 2038)     Height: 5\' 6"  (167.6 cm) Weight: 63.5 kg BMI (Calculated): 22.59   Intake/Output this shift:   Intake/Output Summary (Last 24 hours) at 10/30/2019 0446 Last data filed at 10/30/2019 0300 Gross per 24 hour  Intake 3835.96 ml  Output 600 ml  Net 3235.96 ml    Constitutional :  appears stated age, combative and uncooperative  Respiratory:  clear to auscultation bilaterally  Cardiovascular:  regular rate and rhythm  Gastrointestinal: soft, non-tender; bowel sounds normal; no masses,  no organomegaly. Ostomy, pink, patent, no ostomy output  Skin: Cool and moist. His left foot is more swollen, warm, and erythematous compared to right, but no skin lesions or wounds to note.    Psychiatric: Normal affect, non-agitated, not confused       LABS:  CMP Latest Ref Rng & Units 10/30/2019 10/29/2019 03/15/2014  Glucose 70 - 99 mg/dL 101(H) 92 199(H)  BUN 8 - 23 mg/dL 24(H) 31(H) 28(H)  Creatinine 0.61 - 1.24 mg/dL 0.95 0.82 1.13  Sodium 135 - 145 mmol/L 139 139 133(L)  Potassium 3.5 - 5.1 mmol/L 4.8 4.5 5.1  Chloride 98 - 111 mmol/L 107 101 95(L)  CO2 22 - 32 mmol/L 25 27 29   Calcium 8.9 - 10.3 mg/dL 8.2(L) 9.3 9.1  Total Protein 6.4 - 8.2 g/dL - - 7.2  Total Bilirubin 0.2 - 1.0 mg/dL - - 0.1(L)  Alkaline Phos Unit/L - - 169(H)  AST 15 - 37 Unit/L - - 72(H)  ALT U/L - - 106(H)   CBC Latest Ref Rng & Units 10/30/2019 10/29/2019 10/23/2013  WBC 4.0 - 10.5 K/uL 9.8 7.4 14.0(H)  Hemoglobin 13.0 - 17.0 g/dL 9.6(L) 11.6(L)  8.0(L)  Hematocrit 39.0 - 52.0 % 29.5(L) 35.8(L) 26.3(L)  Platelets 150 - 400 K/uL 147(L) 164 280    RADS: n/a Assessment:   Pneumoperitoneum and sepsis.  Physical exam remains unchanged, although his SBP is 90 on last exam.  Will continue to monitor for now.  Not sure if left foot could be contributing to his overall picture.  This second encounter lasted >47min

## 2019-10-30 NOTE — Consult Note (Addendum)
Subjective:   CC: Pneumoperitoneum  HPI:  Jason Harmon is a 79 y.o. male who was consulted by Randol Kern for issue above.  Patient unable to provide history so chart review performed to obtain history.  Per notes, patient presented to the emergency department almost 24 hours ago with concern possible seizure-like activity.  Work-up noted possible sepsis type picture with leukocytosis and lactic acidosis, and workup for possible source initiated with active resuscitation.  CT abdomen pelvis was eventually performed, due to the increasing lactic acidosis after a transient drop throughout the day, and it showed pneumoperitoneum along the transverse colon.  Surgery consulted for further management and recommendations.  At bedside the patient remains calm, in no distress slowly as he is not examined or palpated.  His agitation supposedly is baseline according to the wife.   Past Medical History:  has a past medical history of A-fib ([place]), Acute renal failure ([place]), Ischemic bowel disease ([place]), Renal disorder, and Sepsis ([place]).  Past Surgical History:  Past Surgical History:  Procedure Laterality Date  . ABDOMINAL SURGERY    . COLOSTOMY N/A 08/20/2013   Procedure: COLOSTOMY;  Surgeon: Rolm Bookbinder, MD;  Location: [place];  Service: General;  Laterality: N/A;  . GASTROSTOMY N/A 08/20/2013   Procedure: G-TUBE PLACEMENT;  Surgeon: Rolm Bookbinder, MD;  Location: [place];  Service: General;  Laterality: N/A;  . LAPAROTOMY N/A 08/18/2013   Procedure: EXPLORATORY LAPAROTOMY;  Surgeon: Joyice Faster. Cornett, MD;  Location: [place];  Service: General;  Laterality: N/A;  . LAPAROTOMY N/A 08/20/2013   Procedure: EXPLORATORY LAPAROTOMY WITH  SMALL BOWEL RESECTION;  Surgeon: Rolm Bookbinder, MD;  Location: [facility];  Service: General;  Laterality: N/A;    Family History: family history includes Aneurysm in his mother.  Social History:  reports that he has never smoked. He has never used smokeless tobacco. He reports  that he does not drink alcohol or use drugs.  Current Medications:  Medications Prior to Admission  Medication Sig Dispense Refill  . acetaminophen (TYLENOL) 650 MG CR tablet Take 650 mg by mouth every 8 (eight) hours as needed for pain.    . bisacodyl (DULCOLAX) 10 MG suppository Place 10 mg rectally 2 (two) times a week.    . DULoxetine (CYMBALTA) 20 MG capsule Take 20 mg by mouth 2 (two) times daily.    Marland Kitchen levothyroxine (SYNTHROID, LEVOTHROID) 100 MCG tablet Take 100 mcg by mouth daily before breakfast. Nancy Fetter Tue Thurs    . Levothyroxine Sodium 88 MCG CAPS Take by mouth daily before breakfast. Mon Wed Fri and Sat    . Lidocaine HCl (ASPERCREME LIDOCAINE) 4 % CREA Apply topically 3 (three) times daily.    Marland Kitchen liver oil-zinc oxide (DESITIN) 40 % ointment Apply 1 application topically See admin instructions. Apply to irritated areas of gluteal and peri-anal skin with each diaper change until healed    . Metoprolol Succinate 25 MG CS24 Take 25 mg by mouth daily.     . Nutritional Supplements (ENSURE ENLIVE PO) Take 237 mLs by mouth in the morning and at bedtime.     . polyethylene glycol (MIRALAX / GLYCOLAX) packet Take 17 g by mouth 2 (two) times daily.    Marland Kitchen senna (SENOKOT) 8.6 MG TABS tablet Take 2 tablets by mouth at bedtime.    . simethicone (MYLICON) 80 MG chewable tablet Chew 80 mg by mouth every 6 (six) hours as needed for flatulence.    . traZODone (DESYREL) 50 MG tablet Take 50 mg by mouth  at bedtime. Can also take 1/2 tablet if needed for insomnia      Allergies:  Allergies as of 10/29/2019  . (No Known Allergies)    ROS:  Unable to obtain secondary to patient's baseline status   Objective:     BP (!) 97/59 (BP Location: Right Arm)   Pulse 94   Temp (!) 97.5 F (36.4 C) (Axillary)   Resp 19   Ht 5\' 6"  (1.676 m)   Wt 63.5 kg   SpO2 99%   BMI 22.58 kg/m   Constitutional :  alert, appears stated age, combative and uncooperative  Lymphatics/Throat:  no asymmetry, masses, or  scars  Respiratory:  clear to auscultation bilaterally  Cardiovascular:  regular rate and rhythm  Gastrointestinal: soft, non-tender; bowel sounds normal; no masses,  no organomegaly.  But it seems like his agitation is slightly worse during the abdominal exam portion compared to the other portions of the physical exam.  Ostomy is pink, moist, patent, but no stool noted in bag  Musculoskeletal: Steady movement  Skin: Cool and moist   Psychiatric:  Agitated and confused       LABS:  CMP Latest Ref Rng & Units 10/29/2019 03/15/2014 10/23/2013  Glucose 70 - 99 mg/dL 92 199(H) 136(H)  BUN 8 - 23 mg/dL 31(H) 28(H) 61(H)  Creatinine 0.61 - 1.24 mg/dL 0.82 1.13 1.30  Sodium 135 - 145 mmol/L 139 133(L) 143  Potassium 3.5 - 5.1 mmol/L 4.5 5.1 5.4(H)  Chloride 98 - 111 mmol/L 101 95(L) 96  CO2 22 - 32 mmol/L 27 29 36(H)  Calcium 8.9 - 10.3 mg/dL 9.3 9.1 9.1  Total Protein 6.4 - 8.2 g/dL - 7.2 -  Total Bilirubin 0.2 - 1.0 mg/dL - 0.1(L) -  Alkaline Phos Unit/L - 169(H) -  AST 15 - 37 Unit/L - 72(H) -  ALT U/L - 106(H) -   CBC Latest Ref Rng & Units 10/29/2019 10/23/2013 10/20/2013  WBC 4.0 - 10.5 K/uL 7.4 14.0(H) 16.2(H)  Hemoglobin 13.0 - 17.0 g/dL 11.6(L) 8.0(L) 8.4(L)  Hematocrit 39.0 - 52.0 % 35.8(L) 26.3(L) 26.4(L)  Platelets 150 - 400 K/uL 164 280 263    RADS: CLINICAL DATA:  Postoperative abdominal pain and fever  EXAM: CT ABDOMEN AND PELVIS WITH CONTRAST  TECHNIQUE: Multidetector CT imaging of the abdomen and pelvis was performed using the standard protocol following bolus administration of intravenous contrast.  CONTRAST:  157mL OMNIPAQUE IOHEXOL 300 MG/ML  SOLN  COMPARISON:  None.  FINDINGS: LOWER CHEST: Small right pleural effusion. Nodular opacity at the lateral right lung base measures 1.2 cm, probably atelectasis.  HEPATOBILIARY: Normal hepatic contours. No intra- or extrahepatic biliary dilatation. The gallbladder is normal.  PANCREAS: Normal pancreas. No  ductal dilatation or peripancreatic fluid collection.  SPLEEN: Normal.  ADRENALS/URINARY TRACT: The adrenal glands are normal. No hydronephrosis, nephroureterolithiasis or solid renal mass. The urinary bladder is normal for degree of distention  STOMACH/BOWEL: Left lower quadrant ostomy. No small bowel dilatation. There is free intraperitoneal air adjacent to the transverse colon. There is also a small amount of free air visible in the left lower quadrant.  VASCULAR/LYMPHATIC: Normal course and caliber of the major abdominal vessels. No abdominal or pelvic lymphadenopathy.  REPRODUCTIVE: Enlarged prostate measures 6.3 cm in transverse dimension.  MUSCULOSKELETAL. Severe osseous overgrowth of both hips, right greater than left.  OTHER: None.  IMPRESSION: 1. Free intraperitoneal air adjacent to the transverse colon and small amount of free air in the left lower quadrant, consistent with  perforation of uncertain location. Anastomotic site in the left mid abdomen is most likely. 2. Small right pleural effusion. 3. Severe osseous overgrowth of both hips, right greater than left.  Critical Value/emergent results were called by telephone at the time of interpretation on 10/30/2019 at 12:48 am to provider [facility] , who verbally acknowledged these results.   Electronically Signed   By: Ulyses Jarred M.D.   On: 10/30/2019 00:50 Assessment:   Pneumoperitoneum noted in a patient presenting with seizure-like activity tachycardia leukocytosis and lactic acidosis  Plan:   Physical exam is very benign relative to the appearance on the CT scan.  He does seem to get slightly agitated more so with the abdominal exam portion, but very difficult to determine whether or not he is having actual pain.  Leukocytosis and lactic acidosis improved during his stay in the hospital, until his most recent value increase in his lactic acidosis.  Vitals seem to have been stabilized  throughout his stay as well, until the most recent blood pressure measurement of systolic in the low 0000000.    Unclear etiology or duration of the pneumoperitoneum noted on CT.  I personally reviewed the images myself, and the free air seems to be localized over the transverse colon, no obvious diverticular tics or signs of inflammation.  Differential diagnosis at this point includes ischemic bowel or localized perforated diverticulitis  I discussed the above findings with the wife who is the power of attorney, explained to her that it is difficult to determine the significance of the pneumoperitoneum on CT, but these tend to be surgical emergencies.  She was very hesitant on proceeding with surgical intervention right away due to his past history of extensive issues recovering from similar surgeries, and requested that she take some time to think over things.  I explained to her that we are pending a repeat CBC to see if there is any worsening leukocytosis again, which would likely indicate a more urgent issue.  I explained to her that I will reach out better again once those results are available.  She verbalized understanding.  We will continue to monitor at bedside closely with serial abdominal exams for now.  Received urgent consultation at 1:09 AM, patient initial assessment completed by 1:40 AM  UPDATE: labs show normal wbc, and bicarb.  Explained to wife this is reassuring but still need to monitor very closely to see if any changes are noted. No need for emergent surgery at this point. She verbalized understanding

## 2019-10-30 NOTE — Progress Notes (Signed)
[facility] Progress Note  Patient: Jason Harmon    [id number]  DOA: 10/29/2019     Date of Service: the patient was seen and examined on 10/30/2019  Chief Complaint  Patient presents with  . Weakness   Brief hospital course:  HPI/brief narrative Jason Harmon is a 79 y.o. male with medical history significant of seizure off medications in the past 2 months, hypothyroidism, atrial fibrillation not on anticoagulants, ischemic bowel with perforation (laparotomy, colostomy and gastrotomy), dementia, bedbound, cannot walk, who presents with seizure-like activity.  Per his wife, patient had history of seizure, but his seizure medication Depakote was discontinued 2 months ago by PCP since patient did not have seizure for long time.  Wife state that pt has been somewhat less responsive in the last 2 days. Today he had an episode of unresponsiveness, rolled his eyes back, and became very stiff. After that he remained confused for at least 30 minutes. He had agonal respirations during this episode. He has been slow to return to his baseline.  Patient does not have chest pain, shortness of breath, cough, fever, chills.  No nausea, vomiting, diarrhea, abdominal pain, symptoms of UTI.  He is bedbound, cannot walk for long time.  ED Course: pt was found to have WBC 7.4, negative COVID-19 PCR, lactic acid 4.0, valproic acid level less than 10, urinalysis (cloudy appearance, negative leukocyte, negative bacteria, WBC 6-10), electrolytes renal function okay, temperature 97, blood pressure 112/79, tachycardia, tachypnea, oxygen saturation 98% on room air.  Chest x-ray negative.  CT head negative.  Patient is placed on progressive bed for observation.   Currently further plan is watch for seizures, await for pneumoperitoneum to resolve     Assessment and Plan: Principal Problem:   Seizure ([place]) Active Problems:   Atrial fibrillation ([facility])   Hypothyroidism   Lactic acidosis    Hypothermia    Pneumoperitoneum and sepsis Lactic acidosis could be secondary to seizures as well, which is resolved after IV fluid as below CT a/p: 1. Free intraperitoneal air adjacent to the transverse colon and small amount of free air in the left lower quadrant, consistent with perforation of uncertain location. Anastomotic site in the left mid abdomen is most likely. Patient was given cefepime, Flagyl and vancomycin Started Zosyn IV every 6 hourly -Follow-up blood culture NGTD and urine culture negative General surgery consulted, recommended no surgical intervention at this time   Seizure disorder: Patient symptoms are most likely due to seizure.  Patient was recently taken off seizure medications.  CT head is negative for acute intracranial abnormalities.  ED physician discussed with neurologist, Dr. Doy Mince, who recommended to restart seizure medications.  -Seizure precaution -When necessary Ativan for seizure - restart Depakote 250 mg twice daily (loaded 500 mg by IV) -Valproic acid level <10   Lactic acidosis: Lactic acid 4.0, 2.5.  Patient received 1 dose of cefepime, Flagyl and vancomycin in ED.  Urinalysis not impressive.  Chest x-ray negative.  No leukocytosis.  Low suspicious for sepsis.  Possibly due to seizure -IV fluid: 3 L normal saline, followed by 75 cc/h -Lactic acid trended down to 1.82 normal -Follow-up blood culture NGTD and urine culture negative   Atrial fibrillation [facility]): Not on anticoagulants at home -Held metoprolol on 4/14 due to low BP Continue monitor vital signs and resume metoprolol as needed   Hypothyroidism -Synthroid   Hypothermia: Mild, temperature 97 -blankets    Body mass index is 22.58 kg/m.  Interventions:   Pressure Ulcer 08/18/13 Stage I -  Intact skin with non-blanchable redness of a localized area usually over a bony prominence. Sacral excoriation stage II (Active)  08/18/13 1130  Location: Sacrum  Location  Orientation: Medial  Staging: Stage I -  Intact skin with non-blanchable redness of a localized area usually over a bony prominence.  Wound Description (Comments): Sacral excoriation stage II  Present on Admission: Yes     Diet: Clear liquid DVT Prophylaxis: Subcutaneous Lovenox   Advance goals of care discussion: DNR  Family Communication: family was present at bedside, at the time of interview.  The pt is unable to provide permission to discuss medical plan with the family.   Disposition:  Pt is from home, admitted with seizures and pneumoperitoneum, still has pneumoperitoneum, which precludes a safe discharge. Discharge to home, when pneumoperitoneum will resolve, no seizures during hospital stay.  Subjective: Went at bedside in the morning, no overnight issues.  Patient was admitted due to seizures and found to have pneumoperitoneum on CT scanning, patient has severe dementia unable to offer any complaints.  Patient is AAO x0  Physical Exam: General:  alert not oriented to time, place, and person.  Appear in mild distress, confused, severely demented Eyes: PERRLA ENT: Oral Mucosa Clear, moist  Neck: no JVD,  Cardiovascular: S1 and S2 Present, no Murmur,  Respiratory: good respiratory effort, Bilateral Air entry equal and Decreased, no Crackles, no wheezes Abdomen: Bowel Sound present, Soft and mild tenderness,  Skin: no rashes Extremities: mild Pedal edema, no calf tenderness Neurologic: Alert but confused, AAO x0, CN grossly intact, patient has bilateral lower extremity contractions and rigid upper extremities.  Patient is unable to follow any commands,. Gait not checked due to patient safety concerns  Vitals:   10/30/19 0130 10/30/19 0406 10/30/19 0823 10/30/19 1229  BP: (!) 97/59 90/79 96/79  (!) 102/56  Pulse: 94 91 84 89  Resp:   17 16  Temp: (!) 97.5 F (36.4 C) 97.7 F (36.5 C)  97.7 F (36.5 C)  TempSrc: Axillary Oral    SpO2: 99% 100%  98%  Weight:       Height:        Intake/Output Summary (Last 24 hours) at 10/30/2019 1404 Last data filed at 10/30/2019 0519 Gross per 24 hour  Intake 3635.96 ml  Output 1050 ml  Net 2585.96 ml   Filed Weights   10/29/19 0940 10/29/19 2038  Weight: 63.5 kg 63.5 kg    Data Reviewed: I have personally reviewed and interpreted daily labs, tele strips, imagings as discussed above. I reviewed all nursing notes, pharmacy notes, vitals, pertinent old records I have discussed plan of care as described above with RN and patient/family.  CBC: Recent Labs  Lab 10/29/19 0945 10/30/19 0207  WBC 7.4 9.8  NEUTROABS  --  7.1  HGB 11.6* 9.6*  HCT 35.8* 29.5*  MCV 102.9* 103.1*  PLT 164 [id number]*   Basic Metabolic Panel: Recent Labs  Lab 10/29/19 0945 10/30/19 0207  NA 139 139  K 4.5 4.8  CL 101 107  CO2 27 25  GLUCOSE 92 101*  BUN 31* 24*  CREATININE 0.82 0.95  CALCIUM 9.3 8.2*    Studies: CT ABDOMEN PELVIS W CONTRAST  Result Date: 10/30/2019 CLINICAL DATA:  Postoperative abdominal pain and fever EXAM: CT ABDOMEN AND PELVIS WITH CONTRAST TECHNIQUE: Multidetector CT imaging of the abdomen and pelvis was performed using the standard protocol following bolus administration of intravenous contrast. CONTRAST:  140mL OMNIPAQUE IOHEXOL 300 MG/ML  SOLN COMPARISON:  None. FINDINGS:  LOWER CHEST: Small right pleural effusion. Nodular opacity at the lateral right lung base measures 1.2 cm, probably atelectasis. HEPATOBILIARY: Normal hepatic contours. No intra- or extrahepatic biliary dilatation. The gallbladder is normal. PANCREAS: Normal pancreas. No ductal dilatation or peripancreatic fluid collection. SPLEEN: Normal. ADRENALS/URINARY TRACT: The adrenal glands are normal. No hydronephrosis, nephroureterolithiasis or solid renal mass. The urinary bladder is normal for degree of distention STOMACH/BOWEL: Left lower quadrant ostomy. No small bowel dilatation. There is free intraperitoneal air adjacent to the transverse  colon. There is also a small amount of free air visible in the left lower quadrant. VASCULAR/LYMPHATIC: Normal course and caliber of the major abdominal vessels. No abdominal or pelvic lymphadenopathy. REPRODUCTIVE: Enlarged prostate measures 6.3 cm in transverse dimension. MUSCULOSKELETAL. Severe osseous overgrowth of both hips, right greater than left. OTHER: None. IMPRESSION: 1. Free intraperitoneal air adjacent to the transverse colon and small amount of free air in the left lower quadrant, consistent with perforation of uncertain location. Anastomotic site in the left mid abdomen is most likely. 2. Small right pleural effusion. 3. Severe osseous overgrowth of both hips, right greater than left. Critical Value/emergent results were called by telephone at the time of interpretation on 10/30/2019 at 12:48 am to provider [facility] , who verbally acknowledged these results. Electronically Signed   By: Ulyses Jarred M.D.   On: 10/30/2019 00:50    Scheduled Meds: . divalproex  250 mg Oral Q12H  . DULoxetine  20 mg Oral BID  . feeding supplement (ENSURE ENLIVE)  237 mL Oral BID BM  . [START ON 10/31/2019] levothyroxine  100 mcg Oral Once per day on Sun Tue Thu  . levothyroxine  88 mcg Oral Once per day on Mon Wed Fri Sat  . Lidocaine HCl  1 application Apply externally TID  . liver oil-zinc oxide  1 application Topical See admin instructions  . polyethylene glycol  17 g Oral BID  . senna  2 tablet Oral QHS  . traZODone  50 mg Oral QHS   Continuous Infusions: . sodium chloride 75 mL/hr at 10/30/19 0826  . piperacillin-tazobactam 3.375 g (10/30/19 0833)   PRN Meds: acetaminophen, bisacodyl, LORazepam, ondansetron (ZOFRAN) IV, simethicone  Time spent: 35 minutes  Author: Val Riles. MD [facility] 10/30/2019 2:04 PM  To reach On-call, see care teams to locate the attending and reach out to them via [email or web address]. If 7PM-7AM, please contact night-coverage If you still have difficulty  reaching the attending provider, please page the [facility] (Director on Call) for [facility] on amion for assistance.

## 2019-10-30 NOTE — Progress Notes (Signed)
CT abd pelvis ordered per request ER MD Dr. Ellender Hose secondary t sepsis with no identified infection source. Free air in abdomen reported y radiologist. Findings discussed with gen surgery Dr.Sakai over the phoone Patient has been hemodynamically stable. Informed nurse to keep patient NPO. Nurse reports no output from colostomy

## 2019-10-31 LAB — CBC
HCT: 30.4 % — ABNORMAL LOW (ref 39.0–52.0)
Hemoglobin: 9.8 g/dL — ABNORMAL LOW (ref 13.0–17.0)
MCH: 33.1 pg (ref 26.0–34.0)
MCHC: 32.2 g/dL (ref 30.0–36.0)
MCV: 102.7 fL — ABNORMAL HIGH (ref 80.0–100.0)
Platelets: 150 10*3/uL (ref 150–400)
RBC: 2.96 MIL/uL — ABNORMAL LOW (ref 4.22–5.81)
RDW: 12.7 % (ref 11.5–15.5)
WBC: 8.5 10*3/uL (ref 4.0–10.5)
nRBC: 0 % (ref 0.0–0.2)

## 2019-10-31 LAB — PHOSPHORUS: Phosphorus: 2.4 mg/dL — ABNORMAL LOW (ref 2.5–4.6)

## 2019-10-31 LAB — MAGNESIUM: Magnesium: 1.8 mg/dL (ref 1.7–2.4)

## 2019-10-31 LAB — BASIC METABOLIC PANEL
Anion gap: 7 (ref 5–15)
BUN: 18 mg/dL (ref 8–23)
CO2: 26 mmol/L (ref 22–32)
Calcium: 8.5 mg/dL — ABNORMAL LOW (ref 8.9–10.3)
Chloride: 106 mmol/L (ref 98–111)
Creatinine, Ser: 0.87 mg/dL (ref 0.61–1.24)
GFR calc Af Amer: >60 mL/min (ref >60)
GFR calc non Af Amer: >60 mL/min (ref >60)
Glucose, Bld: 104 mg/dL — ABNORMAL HIGH (ref 70–99)
Potassium: 3.9 mmol/L (ref 3.5–5.1)
Sodium: 139 mmol/L (ref 135–145)

## 2019-10-31 MED ORDER — MAGNESIUM SULFATE 2 GM/50ML IV SOLN
2.0000 g | Freq: Once | INTRAVENOUS | Status: AC
  Administered 2019-10-31: 2 g via INTRAVENOUS
  Filled 2019-10-31: qty 50

## 2019-10-31 MED ORDER — DILTIAZEM HCL 30 MG PO TABS
30.0000 mg | ORAL_TABLET | Freq: Four times a day (QID) | ORAL | Status: DC
  Administered 2019-10-31 – 2019-11-04 (×11): 30 mg via ORAL
  Filled 2019-10-31 (×19): qty 1

## 2019-10-31 MED ORDER — POTASSIUM PHOSPHATES 15 MMOLE/5ML IV SOLN
20.0000 mmol | Freq: Once | INTRAVENOUS | Status: AC
  Administered 2019-10-31: 20 mmol via INTRAVENOUS
  Filled 2019-10-31: qty 6.67

## 2019-10-31 NOTE — Progress Notes (Signed)
Subjective:  CC: Jason Harmon is a 79 y.o. male  Hospital stay day 1,   sepsis, pneumoperitoneum  HPI: No issues overnight.  ROS:  Unable to obtain secondary to mentation  Objective:   Temp:  [98 F (36.7 C)-98.6 F (37 C)] 98.6 F (37 C) (04/15 0724) Pulse Rate:  [79-109] 101 (04/15 0724) Resp:  [16-20] 20 (04/15 0724) BP: (98-123)/(57-73) 117/73 (04/15 0724) SpO2:  [97 %-99 %] 97 % (04/15 0724) Weight:  [63.5 kg] 63.5 kg (04/15 0638)     Height: 5\' 6"  (167.6 cm) Weight: 63.5 kg BMI (Calculated): 22.62   Intake/Output this shift:   Intake/Output Summary (Last 24 hours) at 10/31/2019 1320 Last data filed at 10/31/2019 1150 Gross per 24 hour  Intake 184.23 ml  Output 1250 ml  Net -1065.77 ml    Constitutional :  appears stated age, combative and uncooperative  Respiratory:  clear to auscultation bilaterally  Cardiovascular:  regular rate and rhythm  Gastrointestinal: soft, non-tender; bowel sounds normal; no masses,  no organomegaly. Ostomy, patent, with pasty light color ostomy output  Skin: Cool and moist. His left foot swelling present, but less erythema.    Psychiatric: Normal affect, non-agitated, not confused       LABS:  CMP Latest Ref Rng & Units 10/31/2019 10/30/2019 10/29/2019  Glucose 70 - 99 mg/dL 104(H) 101(H) 92  BUN 8 - 23 mg/dL 18 24(H) 31(H)  Creatinine 0.61 - 1.24 mg/dL 0.87 0.95 0.82  Sodium 135 - 145 mmol/L 139 139 139  Potassium 3.5 - 5.1 mmol/L 3.9 4.8 4.5  Chloride 98 - 111 mmol/L 106 107 101  CO2 22 - 32 mmol/L 26 25 27   Calcium 8.9 - 10.3 mg/dL 8.5(L) 8.2(L) 9.3  Total Protein 6.4 - 8.2 g/dL - - -  Total Bilirubin 0.2 - 1.0 mg/dL - - -  Alkaline Phos Unit/L - - -  AST 15 - 37 Unit/L - - -  ALT U/L - - -   CBC Latest Ref Rng & Units 10/31/2019 10/30/2019 10/29/2019  WBC 4.0 - 10.5 K/uL 8.5 9.8 7.4  Hemoglobin 13.0 - 17.0 g/dL 9.8(L) 9.6(L) 11.6(L)  Hematocrit 39.0 - 52.0 % 30.4(L) 29.5(L) 35.8(L)  Platelets 150 - 400 K/uL 150 147(L) 164     RADS: n/a Assessment:   Pneumoperitoneum and sepsis.  Labs and clinical exam with no concerns after resuming diet.  Continue care per hospitalist.  Surgery to sign off.  We discussed possible colonoscopy to determine etiology of pneumoperitoneum, but yield will likely be low and no further treatments or preventative measures will be taken even after a colonoscopy with this patient specifically, so family decided to forego any further work-up at this time.  They can follow-up with me as needed as an outpatient basis

## 2019-10-31 NOTE — Progress Notes (Signed)
   10/31/19 1215  Clinical Encounter Type  Visited With Family  Visit Type Initial  Referral From Chaplain  Consult/Referral To Chaplain  While doing rounds, Chaplain spoke with family briefly in the hall. Chaplain comment on the sweatshirt the older lady had on. It was a brief but upbeat conversation.

## 2019-10-31 NOTE — Progress Notes (Signed)
[facility] Progress Note  Patient: Jason Harmon    [id number]  DOA: 10/29/2019     Date of Service: the patient was seen and examined on 10/31/2019  Chief Complaint  Patient presents with  . Weakness   Brief hospital course:  HPI/brief narrative Jason Harmon is a 79 y.o. male with medical history significant of seizure off medications in the past 2 months, hypothyroidism, atrial fibrillation not on anticoagulants, ischemic bowel with perforation (laparotomy, colostomy and gastrotomy), dementia, bedbound, cannot walk, who presents with seizure-like activity.  Per his wife, patient had history of seizure, but his seizure medication Depakote was discontinued 2 months ago by PCP since patient did not have seizure for long time.  Wife state that pt has been somewhat less responsive in the last 2 days. Today he had an episode of unresponsiveness, rolled his eyes back, and became very stiff. After that he remained confused for at least 30 minutes. He had agonal respirations during this episode. He has been slow to return to his baseline.  Patient does not have chest pain, shortness of breath, cough, fever, chills.  No nausea, vomiting, diarrhea, abdominal pain, symptoms of UTI.  He is bedbound, cannot walk for long time.  ED Course: pt was found to have WBC 7.4, negative COVID-19 PCR, lactic acid 4.0, valproic acid level less than 10, urinalysis (cloudy appearance, negative leukocyte, negative bacteria, WBC 6-10), electrolytes renal function okay, temperature 97, blood pressure 112/79, tachycardia, tachypnea, oxygen saturation 98% on room air.  Chest x-ray negative.  CT head negative.  Patient is placed on progressive bed for observation.   Currently further plan is watch for seizures, await for pneumoperitoneum to resolve     Assessment and Plan: Principal Problem:   Seizure ([place]) Active Problems:   Atrial fibrillation (HCC)   Hypothyroidism   Lactic acidosis    Hypothermia    Pneumoperitoneum and sepsis Lactic acidosis could be secondary to seizures as well, which is resolved after IV fluid as below CT a/p: 1. Free intraperitoneal air adjacent to the transverse colon and small amount of free air in the left lower quadrant, consistent with perforation of uncertain location. Anastomotic site in the left mid abdomen is most likely. Patient was given cefepime, Flagyl and vancomycin Started Zosyn IV every 6 hourly -Follow-up blood culture NGTD and urine culture negative General surgery consulted, recommended no surgical intervention at this time   Seizure disorder: Patient symptoms are most likely due to seizure.  Patient was recently taken off seizure medications.  CT head is negative for acute intracranial abnormalities.  ED physician discussed with neurologist, Dr. Doy Mince, who recommended to restart seizure medications.  -Seizure precaution -When necessary Ativan for seizure - restart Depakote 250 mg twice daily (loaded 500 mg by IV) -Valproic acid level <10   Lactic acidosis: Lactic acid 4.0, 2.5.  Patient received 1 dose of cefepime, Flagyl and vancomycin in ED.  Urinalysis not impressive.  Chest x-ray negative.  No leukocytosis.  Low suspicious for sepsis.  Possibly due to seizure -IV fluid: 3 L normal saline, followed by 75 cc/h -Lactic acid trended down to 1.82 normal -Follow-up blood culture NGTD and urine culture negative   Atrial fibrillation [facility]): Not on anticoagulants at home -Held metoprolol on 4/14 due to low BP Continue monitor vital signs and resume metoprolol as needed 4/15 started Cardizem to milligrams p.o. every 8 hourly to control heart rate, BP remains soft  Hypothyroidism -Synthroid   Hypothermia: Mild, temperature 97 -blankets  Body mass index is 22.61 kg/m.  Interventions:   Pressure Ulcer 08/18/13 Stage I -  Intact skin with non-blanchable redness of a localized area usually over a bony  prominence. Sacral excoriation stage II (Active)  08/18/13 1130  Location: Sacrum  Location Orientation: Medial  Staging: Stage I -  Intact skin with non-blanchable redness of a localized area usually over a bony prominence.  Wound Description (Comments): Sacral excoriation stage II  Present on Admission: Yes     Diet: Regular DVT Prophylaxis: Subcutaneous Lovenox   Advance goals of care discussion: DNR  Family Communication: family was present at bedside, at the time of interview.  The pt is unable to provide permission to discuss medical plan with the family.   Disposition:  Pt is from home, admitted with seizures and pneumoperitoneum, still has pneumoperitoneum, which precludes a safe discharge. Discharge to home, when pneumoperitoneum will resolve, no seizures during hospital stay. Patient can be discharged home in 2 to 3 days   Subjective: Went at bedside in the morning, no overnight issues, other than intermittent confusions  patient was admitted due to seizures and found to have pneumoperitoneum on CT scanning, patient has severe dementia unable to offer any complaints.  Patient is AAO x0  Physical Exam: General:  alert not oriented to time, place, and person.  Appear in mild distress, confused, severely demented Eyes: PERRLA ENT: Oral Mucosa Clear, moist  Neck: no JVD,  Cardiovascular: S1 and S2 Present, no Murmur,  Respiratory: good respiratory effort, Bilateral Air entry equal and Decreased, no Crackles, no wheezes Abdomen: Bowel Sound present, Soft and mild tenderness,  Skin: no rashes Extremities: mild Pedal edema, no calf tenderness Neurologic: Alert but confused, AAO x0, CN grossly intact, patient has bilateral lower extremity contractions and rigid upper extremities.  Patient is unable to follow any commands,. Gait not checked due to patient safety concerns  Vitals:   10/30/19 1944 10/31/19 0638 10/31/19 0640 10/31/19 0724  BP:  123/63  117/73  Pulse:  (!)  109  (!) 101  Resp:  20  20  Temp: 98 F (36.7 C)  98.3 F (36.8 C) 98.6 F (37 C)  TempSrc: Oral  Oral Oral  SpO2:  98%  97%  Weight:  63.5 kg    Height:        Intake/Output Summary (Last 24 hours) at 10/31/2019 1500 Last data filed at 10/31/2019 1345 Gross per 24 hour  Intake 424.23 ml  Output 750 ml  Net -325.77 ml   Filed Weights   10/29/19 0940 10/29/19 2038 10/31/19 0638  Weight: 63.5 kg 63.5 kg 63.5 kg    Data Reviewed: I have personally reviewed and interpreted daily labs, tele strips, imagings as discussed above. I reviewed all nursing notes, pharmacy notes, vitals, pertinent old records I have discussed plan of care as described above with RN and patient/family.  CBC: Recent Labs  Lab 10/29/19 0945 10/30/19 0207 10/31/19 0427  WBC 7.4 9.8 8.5  NEUTROABS  --  7.1  --   HGB 11.6* 9.6* 9.8*  HCT 35.8* 29.5* 30.4*  MCV 102.9* 103.1* 102.7*  PLT 164 147* [id number]   Basic Metabolic Panel: Recent Labs  Lab 10/29/19 0945 10/30/19 0207 10/31/19 0427  NA 139 139 139  K 4.5 4.8 3.9  CL 101 107 106  CO2 27 25 26   GLUCOSE 92 101* 104*  BUN 31* 24* 18  CREATININE 0.82 0.95 0.87  CALCIUM 9.3 8.2* 8.5*  MG  --   --  1.8  PHOS  --   --  2.4*    Studies: No results found.  Scheduled Meds: . diltiazem  30 mg Oral Q6H  . divalproex  250 mg Oral Q12H  . DULoxetine  20 mg Oral BID  . enoxaparin (LOVENOX) injection  40 mg Subcutaneous Q24H  . feeding supplement (ENSURE ENLIVE)  237 mL Oral BID BM  . levothyroxine  100 mcg Oral Once per day on Sun Tue Thu  . levothyroxine  88 mcg Oral Once per day on Mon Wed Fri Sat  . Lidocaine HCl  1 application Apply externally TID  . liver oil-zinc oxide  1 application Topical See admin instructions  . polyethylene glycol  17 g Oral BID  . senna  2 tablet Oral QHS  . traZODone  50 mg Oral QHS   Continuous Infusions: . piperacillin-tazobactam 3.375 g (10/31/19 1340)  . potassium PHOSPHATE IVPB (in mmol) 20 mmol (10/31/19  1142)   PRN Meds: acetaminophen, bisacodyl, LORazepam, ondansetron (ZOFRAN) IV, simethicone  Time spent: 35 minutes  Author: Val Riles. MD [facility] 10/31/2019 3:00 PM  To reach On-call, see care teams to locate the attending and reach out to them via [email or web address]. If 7PM-7AM, please contact night-coverage If you still have difficulty reaching the attending provider, please page the [facility] (Director on Call) for [facility] on amion for assistance.

## 2019-11-01 LAB — BASIC METABOLIC PANEL
Anion gap: 8 (ref 5–15)
BUN: 13 mg/dL (ref 8–23)
CO2: 24 mmol/L (ref 22–32)
Calcium: 8 mg/dL — ABNORMAL LOW (ref 8.9–10.3)
Chloride: 105 mmol/L (ref 98–111)
Creatinine, Ser: 0.78 mg/dL (ref 0.61–1.24)
GFR calc Af Amer: >60 mL/min (ref >60)
GFR calc non Af Amer: >60 mL/min (ref >60)
Glucose, Bld: 109 mg/dL — ABNORMAL HIGH (ref 70–99)
Potassium: 3.7 mmol/L (ref 3.5–5.1)
Sodium: 137 mmol/L (ref 135–145)

## 2019-11-01 LAB — PHOSPHORUS: Phosphorus: 2.5 mg/dL (ref 2.5–4.6)

## 2019-11-01 LAB — VITAMIN D 25 HYDROXY (VIT D DEFICIENCY, FRACTURES): Vit D, 25-Hydroxy: 32.65 ng/mL (ref 30–100)

## 2019-11-01 LAB — MAGNESIUM: Magnesium: 2.1 mg/dL (ref 1.7–2.4)

## 2019-11-01 LAB — CBC
HCT: 28.5 % — ABNORMAL LOW (ref 39.0–52.0)
Hemoglobin: 9.4 g/dL — ABNORMAL LOW (ref 13.0–17.0)
MCH: 33.2 pg (ref 26.0–34.0)
MCHC: 33 g/dL (ref 30.0–36.0)
MCV: 100.7 fL — ABNORMAL HIGH (ref 80.0–100.0)
Platelets: 125 10*3/uL — ABNORMAL LOW (ref 150–400)
RBC: 2.83 MIL/uL — ABNORMAL LOW (ref 4.22–5.81)
RDW: 12.6 % (ref 11.5–15.5)
WBC: 7.4 10*3/uL (ref 4.0–10.5)
nRBC: 0 % (ref 0.0–0.2)

## 2019-11-01 LAB — FOLATE: Folate: 29 ng/mL (ref >5.9)

## 2019-11-01 LAB — VITAMIN B12: Vitamin B-12: 313 pg/mL (ref 180–914)

## 2019-11-01 MED ORDER — METOPROLOL TARTRATE 5 MG/5ML IV SOLN
2.5000 mg | Freq: Four times a day (QID) | INTRAVENOUS | Status: DC | PRN
  Administered 2019-11-01 – 2019-11-03 (×2): 2.5 mg via INTRAVENOUS
  Filled 2019-11-01 (×2): qty 5

## 2019-11-01 MED ORDER — POTASSIUM PHOSPHATES 15 MMOLE/5ML IV SOLN
20.0000 mmol | Freq: Once | INTRAVENOUS | Status: AC
  Administered 2019-11-01: 20 mmol via INTRAVENOUS
  Filled 2019-11-01: qty 6.67

## 2019-11-01 NOTE — Care Management Important Message (Signed)
Important Message  Patient Details  Name: Jason Harmon MRN: [id number] Date of Birth: 17-Jul-1941   Medicare Important Message Given:  Yes  Initial Medicare IM given by Patient Access Associate on 10/31/2019 at 9:25am.     [name] 11/01/2019, 8:26 AM

## 2019-11-01 NOTE — Progress Notes (Signed)
Jason Harmon visited with Jason Harmon's wife and daughter outside room after hearing Jason Harmon make loud sounds. Jason Harmon's daughter reported that Jason Harmon was agitated with Jason Harmon's legs being constricted. Jason Harmon's wife reported that Jason Harmon will be discharged soon. They were grateful to have met the Ch. Both wife and daughter of Jason Harmon expressed concerns. Ch provided ministry of presence. They were grateful for visit and asked Ch to remember Jason Harmon in prayers. Jason Harmon let know [place] availability to Jason Harmon's family, to call anytime.

## 2019-11-01 NOTE — Progress Notes (Addendum)
Pt refused all medicines tonight. Went to pt room muliple times to encourages to take medicines but pt still refused. Notify NO Ouma. Will continue to monitor.  Update 2206: Pt HR was sustaining at ST from 117 to 120. No PRN meds order. Notify Ouma NP. Will continue to monitor.  Update 2235: Ouma NP ordered metropolol tartrate 2.5 mg every 6 hours HR > 110 sustaining. Will continue to monitor.  Update 2245: Prn Metropolol 2.5 mg was administered. Pt HR at 102 this time. Will continue to monitor.

## 2019-11-01 NOTE — Clinical Social Work Note (Addendum)
CSW updated Jason Harmon at [place]. Shirlean Mylar states if pt d/c over the weekend CSW to call [phone] to update.  [place], [place]

## 2019-11-01 NOTE — Progress Notes (Signed)
[facility] Progress Note  Patient: Jason Harmon    [id number]  DOA: 10/29/2019     Date of Service: the patient was seen and examined on 11/01/2019  Chief Complaint  Patient presents with  . Weakness   Brief hospital course:  HPI/brief narrative Octavio Kall is a 79 y.o. male with medical history significant of seizure off medications in the past 2 months, hypothyroidism, atrial fibrillation not on anticoagulants, ischemic bowel with perforation (laparotomy, colostomy and gastrotomy), dementia, bedbound, cannot walk, who presents with seizure-like activity.  Per his wife, patient had history of seizure, but his seizure medication Depakote was discontinued 2 months ago by PCP since patient did not have seizure for long time.  Wife state that pt has been somewhat less responsive in the last 2 days. Today he had an episode of unresponsiveness, rolled his eyes back, and became very stiff. After that he remained confused for at least 30 minutes. He had agonal respirations during this episode. He has been slow to return to his baseline.  Patient does not have chest pain, shortness of breath, cough, fever, chills.  No nausea, vomiting, diarrhea, abdominal pain, symptoms of UTI.  He is bedbound, cannot walk for long time.  ED Course: pt was found to have WBC 7.4, negative COVID-19 PCR, lactic acid 4.0, valproic acid level less than 10, urinalysis (cloudy appearance, negative leukocyte, negative bacteria, WBC 6-10), electrolytes renal function okay, temperature 97, blood pressure 112/79, tachycardia, tachypnea, oxygen saturation 98% on room air.  Chest x-ray negative.  CT head negative.  Patient is placed on progressive bed for observation.   Currently further plan is watch for seizures, await for pneumoperitoneum to resolve     Assessment and Plan: Principal Problem:   Seizure ([place]) Active Problems:   Atrial fibrillation (HCC)   Hypothyroidism   Lactic acidosis    Hypothermia    Pneumoperitoneum and sepsis Lactic acidosis could be secondary to seizures as well, which is resolved after IV fluid as below CT a/p: 1. Free intraperitoneal air adjacent to the transverse colon and small amount of free air in the left lower quadrant, consistent with perforation of uncertain location. Anastomotic site in the left mid abdomen is most likely. Patient was given cefepime, Flagyl and vancomycin Started Zosyn IV every 6 hourly, which to oral antibiotics on discharge -Blood culture NGTD and urine culture negative General surgery consulted, recommended no surgical intervention at this time and signed off.    Seizure disorder: Patient symptoms are most likely due to seizure.  Patient was recently taken off seizure medications.  CT head is negative for acute intracranial abnormalities.  ED physician discussed with neurologist, Dr. Doy Mince, who recommended to restart seizure medications.  -Seizure precaution -When necessary Ativan for seizure - restart Depakote 250 mg twice daily (loaded 500 mg by IV) -Valproic acid level <10   Lactic acidosis: Lactic acid 4.0, 2.5.  Patient received 1 dose of cefepime, Flagyl and vancomycin in ED.  Urinalysis not impressive.  Chest x-ray negative.  No leukocytosis.  Low suspicious for sepsis.  Possibly due to seizure -IV fluid: 3 L normal saline, followed by 75 cc/h -Lactic acid trended down to 1.82 normal -Follow-up blood culture NGTD and urine culture negative   Atrial fibrillation [facility]): Not on anticoagulants at home -Held metoprolol on 4/14 due to low BP Continue monitor vital signs and resume metoprolol as needed 4/15 started Cardizem 30 mg p.o. every 8 hourly to control heart rate, BP remains soft  Hypothyroidism -Synthroid  Hypothermia: Mild, temperature 97 -blankets    Body mass index is 22.71 kg/m.  Interventions:   Pressure Ulcer 08/18/13 Stage I -  Intact skin with non-blanchable redness of a  localized area usually over a bony prominence. Sacral excoriation stage II (Active)  08/18/13 1130  Location: Sacrum  Location Orientation: Medial  Staging: Stage I -  Intact skin with non-blanchable redness of a localized area usually over a bony prominence.  Wound Description (Comments): Sacral excoriation stage II  Present on Admission: Yes     Diet: Regular DVT Prophylaxis: Subcutaneous Lovenox   Advance goals of care discussion: DNR  Family Communication: family was present at bedside, at the time of interview.  The pt is unable to provide permission to discuss medical plan with the family.   Disposition:  Pt is from home, admitted with seizures and pneumoperitoneum, still has pneumoperitoneum, which precludes a safe discharge. Discharge to home, when pneumoperitoneum will resolve, no seizures during hospital stay. Patient can be discharged home tomorrow on 11/02/2019   Subjective: Patient was seen and examined over bedside.  No overnight issues, patient remained without any agitation as per family.   She seems to be more awake and alert today, able to tell me his first name nothing else.      Physical Exam: General:  alert not oriented to time, place, and person.  Appear in mild distress, confused, severely demented Eyes: PERRLA ENT: Oral Mucosa Clear, moist  Neck: no JVD,  Cardiovascular: S1 and S2 Present, no Murmur,  Respiratory: good respiratory effort, Bilateral Air entry equal and Decreased, no Crackles, no wheezes Abdomen: Bowel Sound present, Soft and mild tenderness,  Skin: no rashes Extremities: mild Pedal edema, no calf tenderness Neurologic: Alert but confused, AAO x0, CN grossly intact, patient has bilateral lower extremity contractions and rigid upper extremities.  Patient is unable to follow any commands,. Gait not checked due to patient safety concerns  Vitals:   11/01/19 0524 11/01/19 0527 11/01/19 0600 11/01/19 0805  BP: (!) 85/50 106/61  (!) 123/57   Pulse: 71 70  81  Resp: 18 18 15 18   Temp: 98.8 F (37.1 C) 98.8 F (37.1 C)    TempSrc: Oral Axillary    SpO2: 98% 96%  99%  Weight:  63.8 kg    Height:        Intake/Output Summary (Last 24 hours) at 11/01/2019 1551 Last data filed at 11/01/2019 1330 Gross per 24 hour  Intake 720 ml  Output 800 ml  Net -80 ml   Filed Weights   10/29/19 2038 10/31/19 0638 11/01/19 0527  Weight: 63.5 kg 63.5 kg 63.8 kg    Data Reviewed: I have personally reviewed and interpreted daily labs, tele strips, imagings as discussed above. I reviewed all nursing notes, pharmacy notes, vitals, pertinent old records I have discussed plan of care as described above with RN and patient/family.  CBC: Recent Labs  Lab 10/29/19 0945 10/30/19 0207 10/31/19 0427 11/01/19 0553  WBC 7.4 9.8 8.5 7.4  NEUTROABS  --  7.1  --   --   HGB 11.6* 9.6* 9.8* 9.4*  HCT 35.8* 29.5* 30.4* 28.5*  MCV 102.9* 103.1* 102.7* 100.7*  PLT 164 147* 150 0000000*   Basic Metabolic Panel: Recent Labs  Lab 10/29/19 0945 10/30/19 0207 10/31/19 0427 11/01/19 0553  NA 139 139 139 137  K 4.5 4.8 3.9 3.7  CL 101 107 106 105  CO2 27 25 26 24   GLUCOSE 92 101* 104* 109*  BUN 31* 24* 18 13  CREATININE 0.82 0.95 0.87 0.78  CALCIUM 9.3 8.2* 8.5* 8.0*  MG  --   --  1.8 2.1  PHOS  --   --  2.4* 2.5    Studies: No results found.  Scheduled Meds: . diltiazem  30 mg Oral Q6H  . divalproex  250 mg Oral Q12H  . DULoxetine  20 mg Oral BID  . enoxaparin (LOVENOX) injection  40 mg Subcutaneous Q24H  . feeding supplement (ENSURE ENLIVE)  237 mL Oral BID BM  . levothyroxine  100 mcg Oral Once per day on Sun Tue Thu  . levothyroxine  88 mcg Oral Once per day on Mon Wed Fri Sat  . Lidocaine HCl  1 application Apply externally TID  . liver oil-zinc oxide  1 application Topical See admin instructions  . polyethylene glycol  17 g Oral BID  . senna  2 tablet Oral QHS  . traZODone  50 mg Oral QHS   Continuous Infusions: .  piperacillin-tazobactam 3.375 g (11/01/19 1323)  . potassium PHOSPHATE IVPB (in mmol) 20 mmol (11/01/19 1034)   PRN Meds: acetaminophen, bisacodyl, LORazepam, ondansetron (ZOFRAN) IV, simethicone  Time spent: 35 minutes  Author: Val Riles. MD [facility] 11/01/2019 3:51 PM  To reach On-call, see care teams to locate the attending and reach out to them via [email or web address]. If 7PM-7AM, please contact night-coverage If you still have difficulty reaching the attending provider, please page the [facility] (Director on Call) for [facility] on amion for assistance.

## 2019-11-02 DIAGNOSIS — R21 Rash and other nonspecific skin eruption: Secondary | ICD-10-CM

## 2019-11-02 DIAGNOSIS — K668 Other specified disorders of peritoneum: Secondary | ICD-10-CM

## 2019-11-02 LAB — CBC
HCT: 31.4 % — ABNORMAL LOW (ref 39.0–52.0)
Hemoglobin: 10.4 g/dL — ABNORMAL LOW (ref 13.0–17.0)
MCH: 33 pg (ref 26.0–34.0)
MCHC: 33.1 g/dL (ref 30.0–36.0)
MCV: 99.7 fL (ref 80.0–100.0)
Platelets: 150 10*3/uL (ref 150–400)
RBC: 3.15 MIL/uL — ABNORMAL LOW (ref 4.22–5.81)
RDW: 12.7 % (ref 11.5–15.5)
WBC: 9.8 10*3/uL (ref 4.0–10.5)
nRBC: 0 % (ref 0.0–0.2)

## 2019-11-02 LAB — BASIC METABOLIC PANEL
Anion gap: 8 (ref 5–15)
BUN: 9 mg/dL (ref 8–23)
CO2: 27 mmol/L (ref 22–32)
Calcium: 8.7 mg/dL — ABNORMAL LOW (ref 8.9–10.3)
Chloride: 101 mmol/L (ref 98–111)
Creatinine, Ser: 0.9 mg/dL (ref 0.61–1.24)
GFR calc Af Amer: >60 mL/min (ref >60)
GFR calc non Af Amer: >60 mL/min (ref >60)
Glucose, Bld: 121 mg/dL — ABNORMAL HIGH (ref 70–99)
Potassium: 3.9 mmol/L (ref 3.5–5.1)
Sodium: 136 mmol/L (ref 135–145)

## 2019-11-02 LAB — PHOSPHORUS: Phosphorus: 2.6 mg/dL (ref 2.5–4.6)

## 2019-11-02 LAB — MAGNESIUM: Magnesium: 2 mg/dL (ref 1.7–2.4)

## 2019-11-02 MED ORDER — ZOLPIDEM TARTRATE 5 MG PO TABS
5.0000 mg | ORAL_TABLET | Freq: Every day | ORAL | Status: DC
  Administered 2019-11-02 – 2019-11-03 (×2): 5 mg via ORAL
  Filled 2019-11-02 (×2): qty 1

## 2019-11-02 MED ORDER — AMOXICILLIN-POT CLAVULANATE 875-125 MG PO TABS
1.0000 | ORAL_TABLET | Freq: Two times a day (BID) | ORAL | Status: DC
  Administered 2019-11-02 – 2019-11-04 (×3): 1 via ORAL
  Filled 2019-11-02 (×4): qty 1

## 2019-11-02 NOTE — Plan of Care (Signed)
  Problem: Education: Goal: Knowledge of General Education information will improve Description: Including pain rating scale, medication(s)/side effects and non-pharmacologic comfort measures Outcome: Not Progressing Note: Patient has dementia and a new diffuse rash. Possibly to d/c back home in the AM. Will continue to monitor overall condition. Wenda Low [facility]

## 2019-11-02 NOTE — Plan of Care (Signed)
  Problem: Safety: Goal: Ability to remain free from injury will improve Outcome: Progressing   

## 2019-11-02 NOTE — Progress Notes (Signed)
PROGRESS NOTE    Jason Harmon  PPI:[id number] DOB: 1941/02/27 DOA: 10/29/2019 PCP: System, Pcp Not In    Chief Complaint  Patient presents with  . Weakness    Brief Narrative:  79 year old male with history of seizures who was taken off medication for past 2 months, hypothyroidism, bedbound status, A. 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 Hospital course prolonged due to development of sepsis with pneumoperitoneum.  Had lactic acidosis on presentation.  Assessment & Plan:   Sepsis secondary to pneumoperitoneum Elevated lactic acid on presentation improved with IV fluids.  CT abdomen pelvis showing free intraperitoneal air at the central transverse colon and left lower quadrant with perforation of uncertain location.  Suspected anastomotic site in the left mid abdomen. Was placed on IV vancomycin, cefepime and Flagyl and then transition to IV Zosyn.  Transition to oral Augmentin.  Active problems Macular rash Patient afebrile but has developed areas of macular rash over the limbs and back.  They are not warm, tender or pruritic. We will monitor off IV antibiotic and monitor with Augmentin.  No drug allergy listed.  Seizure disorder Seizure-like activity on presentation.  Recently was taken off Depakote and now resumed as per neurology recommendation.  Head CT without acute findings.  Paroxysmal A. fib Metoprolol held due to low blood pressure and started on low-dose Cardizem.  Not on anticoagulation.  Hypothyroidism Continue Synthroid.  Bedbound status and failure to thrive Has moderate dementia.  Patient is deconditioned.  Will obtain nutrition consult.  DVT prophylaxis: Subcu Lovenox Code Status: DNR Family Communication: Wife and daughter-in-law at bedside Disposition: Home tomorrow if rash improves and no further risk for sepsis.  Status  is: Inpatient  Patient needs further 24-hour monitoring as inpatient given new onset of diffuse rash.  Dispo: The patient is from: Home              Anticipated d/c is to: Home              Anticipated d/c date is: 4/18              Patient currently: Stable and improving        Consultants:   Surgery   Procedures: CT abdomen and pelvis  Antimicrobials: Augmentin  Subjective: Patient crying out in discomfort.  Noted for new diffuse macular rash over the back, arms and legs.  Objective: Vitals:   11/01/19 2300 11/02/19 0121 11/02/19 0527 11/02/19 1342  BP:  112/68 (!) 108/97 134/79  Pulse:  98 85 (!) 103  Resp: _0 Temp:   98 F (36.7 C) 99.9 F (37.7 C)  TempSrc:   Oral Axillary  SpO2:   100% 98%  Weight:   64.6 kg   Height:        Intake/Output Summary (Last 24 hours) at 11/02/2019 1423 Last data filed at 11/02/2019 0630 Gross per 24 hour  Intake 600 ml  Output 100 ml  Net 500 ml   Filed Weights   10/31/19 0638 11/01/19 0527 11/02/19 0527  Weight: 63.5 kg 63.8 kg 64.6 kg    Examination:  General: Elderly male appears fatigued, irritable, not in distress HEENT: Moist mucosa, supple neck Chest: Clear bilaterally CVs: Normal S1-S2, no murmurs GI: Colostomy tube in place, nondistended, nontender, bowel sounds present Musculoskeletal: Diffuse tiny erythematous macular rash over the back, arms and legs, no warmth or tenderness. CNS: Alert and awake,  poorly communicative.  Data Reviewed: I have personally reviewed following labs and imaging studies  CBC: Recent Labs  Lab 10/29/19 0945 10/30/19 0207 10/31/19 0427 11/01/19 0553 11/02/19 0635  WBC 7.4 9.8 8.5 7.4 9.8  NEUTROABS  --  7.1  --   --   --   HGB 11.6* 9.6* 9.8* 9.4* 10.4*  HCT 35.8* 29.5* 30.4* 28.5* 31.4*  MCV 102.9* 103.1* 102.7* 100.7* 99.7  PLT 164 147* 150 125* 161    Basic Metabolic Panel: Recent Labs  Lab 10/29/19 0945 10/30/19 0207 10/31/19 0427 11/01/19 0553  11/02/19 0635  NA 139 139 139 137 136  K 4.5 4.8 3.9 3.7 3.9  CL 101 107 106 105 101  CO2 _0 GLUCOSE 92 101* 104* 109* 121*  BUN 31* 24* _1 CREATININE 0.82 0.95 0.87 0.78 0.90  CALCIUM 9.3 8.2* 8.5* 8.0* 8.7*  MG  --   --  1.8 2.1 2.0  PHOS  --   --  2.4* 2.5 2.6    GFR: Estimated Creatinine Clearance: 63 mL/min (by C-G formula based on SCr of 0.9 mg/dL).  Liver Function Tests: No results for input(s): AST, ALT, ALKPHOS, BILITOT, PROT, ALBUMIN in the last 168 hours.  CBG: No results for input(s): GLUCAP in the last 168 hours.   Recent Results (from the past 240 hour(s))  Blood culture (routine x 2)     Status: None (Preliminary result)   Collection Time: 10/29/19 12:25 PM   Specimen: BLOOD  Result Value Ref Range Status   Specimen Description BLOOD BLOOD LEFT FOREARM  Final   Special Requests   Final    BOTTLES DRAWN AEROBIC AND ANAEROBIC Blood Culture adequate volume   Culture   Final    NO GROWTH 4 DAYS Performed at [facility], [address], [place], [place] [address]    Report Status PENDING  Incomplete  Blood culture (routine x 2)     Status: None (Preliminary result)   Collection Time: 10/29/19 12:25 PM   Specimen: BLOOD  Result Value Ref Range Status   Specimen Description BLOOD BLOOD RIGHT FOREARM  Final   Special Requests   Final    BOTTLES DRAWN AEROBIC AND ANAEROBIC Blood Culture adequate volume   Culture   Final    NO GROWTH 4 DAYS Performed at [facility], [address], [place], [place] [address]    Report Status PENDING  Incomplete  Urine culture     Status: None   Collection Time: 10/29/19 12:25 PM   Specimen: Urine, Catheterized  Result Value Ref Range Status   Specimen Description   Final    URINE, CATHETERIZED Performed at [facility], [address] [place], [place] [address]    Special Requests   Final    NONE Performed at [facility], [address], [place], [place]  [address]    Culture   Final    NO GROWTH Performed at [facility], [place] [address] [place], [place] [address]    Report Status 10/30/2019 FINAL  Final  Respiratory Panel by RT PCR (Flu A&B, Covid) - Nasopharyngeal Swab     Status: None   Collection Time: 10/29/19  1:58 PM   Specimen: Nasopharyngeal Swab  Result Value Ref Range Status   SARS Coronavirus 2 by RT PCR NEGATIVE NEGATIVE Final    Comment: (NOTE) SARS-CoV-2 target nucleic acids are NOT DETECTED. The SARS-CoV-2 RNA is generally detectable in  upper respiratoy specimens during the acute phase of infection. The lowest concentration of SARS-CoV-2 viral copies this assay can detect is 131 copies/mL. A negative result does not preclude SARS-Cov-2 infection and should not be used as the sole basis for treatment or other patient management decisions. A negative result may occur with  improper specimen collection/handling, submission of specimen other than nasopharyngeal swab, presence of viral mutation(s) within the areas targeted by this assay, and inadequate number of viral copies (<131 copies/mL). A negative result must be combined with clinical observations, patient history, and epidemiological information. The expected result is Negative. Fact Sheet for Patients:  PinkCheek.be Fact Sheet for Healthcare Providers:  GravelBags.it This test is not yet ap proved or cleared by the [place] FDA and  has been authorized for detection and/or diagnosis of SARS-CoV-2 by FDA under an Emergency Use Authorization (EUA). This EUA will remain  in effect (meaning this test can be used) for the duration of the COVID-19 declaration under Section 564(b)(1) of the Act, 21 U.S.C. section 360bbb-3(b)(1), unless the authorization is terminated or revoked sooner.    Influenza A by PCR NEGATIVE NEGATIVE Final   Influenza B by PCR NEGATIVE NEGATIVE Final    Comment: (NOTE) The  Xpert Xpress SARS-CoV-2/FLU/RSV assay is intended as an aid in  the diagnosis of influenza from Nasopharyngeal swab specimens and  should not be used as a sole basis for treatment. Nasal washings and  aspirates are unacceptable for Xpert Xpress SARS-CoV-2/FLU/RSV  testing. Fact Sheet for Patients: PinkCheek.be Fact Sheet for Healthcare Providers: GravelBags.it This test is not yet approved or cleared by the [place] FDA and  has been authorized for detection and/or diagnosis of SARS-CoV-2 by  FDA under an Emergency Use Authorization (EUA). This EUA will remain  in effect (meaning this test can be used) for the duration of the  Covid-19 declaration under Section 564(b)(1) of the Act, 21  U.S.C. section 360bbb-3(b)(1), unless the authorization is  terminated or revoked. Performed at [facility], [address] [place], [place] [address]          Radiology Studies: No results found.      Scheduled Meds: . amoxicillin-clavulanate  1 tablet Oral Q12H  . diltiazem  30 mg Oral Q6H  . divalproex  250 mg Oral Q12H  . DULoxetine  20 mg Oral BID  . enoxaparin (LOVENOX) injection  40 mg Subcutaneous Q24H  . feeding supplement (ENSURE ENLIVE)  237 mL Oral BID BM  . levothyroxine  100 mcg Oral Once per day on Sun Tue Thu  . levothyroxine  88 mcg Oral Once per day on Mon Wed Fri Sat  . Lidocaine HCl  1 application Apply externally TID  . liver oil-zinc oxide  1 application Topical See admin instructions  . polyethylene glycol  17 g Oral BID  . senna  2 tablet Oral QHS  . traZODone  50 mg Oral QHS   Continuous Infusions:   LOS: 3 days    Time spent: 25 minutes    Mccoy Testa, MD [facility]   To contact the attending provider between 7A-7P or the covering provider during after hours 7P-7A, please log into the web site [email or web address] and access using universal [place] password for that  web site. If you do not have the password, please call the hospital operator.  11/02/2019, 2:23 PM

## 2019-11-02 NOTE — Progress Notes (Signed)
   11/02/19 1100  Clinical Encounter Type  Visited With Patient and family together  Visit Type Follow-up  Referral From Chaplain  Consult/Referral To Chaplain  Spiritual Encounters  Spiritual Needs Emotional  Stress Factors  Patient Stress Factors Health changes  Family Stress Factors Exhausted  [place] met with family at the req of the previous on-call [employer]. PT's family was having issues with some cleanliness concerns with the PT. Family stated that the issues were resolved and thanked the pastoral care and counseling staff for their support

## 2019-11-03 LAB — CULTURE, BLOOD (ROUTINE X 2)
Culture: NO GROWTH
Culture: NO GROWTH
Special Requests: ADEQUATE
Special Requests: ADEQUATE

## 2019-11-03 LAB — BASIC METABOLIC PANEL
Anion gap: 9 (ref 5–15)
BUN: 9 mg/dL (ref 8–23)
CO2: 24 mmol/L (ref 22–32)
Calcium: 8.7 mg/dL — ABNORMAL LOW (ref 8.9–10.3)
Chloride: 102 mmol/L (ref 98–111)
Creatinine, Ser: 0.74 mg/dL (ref 0.61–1.24)
GFR calc Af Amer: >60 mL/min (ref >60)
GFR calc non Af Amer: >60 mL/min (ref >60)
Glucose, Bld: 130 mg/dL — ABNORMAL HIGH (ref 70–99)
Potassium: 4.2 mmol/L (ref 3.5–5.1)
Sodium: 135 mmol/L (ref 135–145)

## 2019-11-03 LAB — CBC
HCT: 31.3 % — ABNORMAL LOW (ref 39.0–52.0)
Hemoglobin: 10.2 g/dL — ABNORMAL LOW (ref 13.0–17.0)
MCH: 33.2 pg (ref 26.0–34.0)
MCHC: 32.6 g/dL (ref 30.0–36.0)
MCV: 102 fL — ABNORMAL HIGH (ref 80.0–100.0)
Platelets: 158 10*3/uL (ref 150–400)
RBC: 3.07 MIL/uL — ABNORMAL LOW (ref 4.22–5.81)
RDW: 12.8 % (ref 11.5–15.5)
WBC: 9.8 10*3/uL (ref 4.0–10.5)
nRBC: 0 % (ref 0.0–0.2)

## 2019-11-03 MED ORDER — PREDNISONE 20 MG PO TABS
40.0000 mg | ORAL_TABLET | Freq: Every day | ORAL | Status: DC
  Administered 2019-11-03 – 2019-11-04 (×2): 40 mg via ORAL
  Filled 2019-11-03 (×2): qty 2

## 2019-11-03 MED ORDER — HYDROXYZINE HCL 10 MG PO TABS
10.0000 mg | ORAL_TABLET | Freq: Three times a day (TID) | ORAL | Status: AC
  Administered 2019-11-03 (×2): 10 mg via ORAL
  Filled 2019-11-03 (×2): qty 1

## 2019-11-03 NOTE — Progress Notes (Signed)
   11/03/19 1345  Clinical Encounter Type  Visited With Patient and family together  Visit Type Follow-up  Referral From Chaplain  Consult/Referral To Chaplain  Spiritual Encounters  Spiritual Needs Emotional  [facility] completed follow up visit with pt, wife and daughter-in-law. Pt was not able to hold meaningful conversation. [place] spent several minutes building rapport with family. Pastoral visit was appreciated. Follow Up visits are welcomed.

## 2019-11-03 NOTE — Progress Notes (Signed)
PROGRESS NOTE    Jason Harmon  OEV:[id number] DOB: 1941/03/21 DOA: 10/29/2019 PCP: System, Pcp Not In    Chief Complaint  Patient presents with  . Weakness    Brief Narrative:  79 year old male with history of seizures who was taken off medication for past 2 months, hypothyroidism, bedbound status, A. 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 Hospital course prolonged due to development of sepsis with pneumoperitoneum.  Had lactic acidosis on presentation.  Assessment & Plan:   Sepsis secondary to pneumoperitoneum Elevated lactic acid on presentation improved with IV fluids.  CT abdomen pelvis showing free intraperitoneal air at the central transverse colon and left lower quadrant with perforation of uncertain location.  Suspected anastomotic site in the left mid abdomen. Sepsis resolved. Was placed on IV vancomycin, cefepime and Flagyl and then transition to IV Zosyn.  Now transition to oral Augmentin and will treat for total 7 days until 4/19.  Active problems Macular rash Noted on 4/17.  Patient afebrile but has developed areas of macular rash over the limbs and back which have progressed today.  They are slightly warm to touch but nontender and nonpruritic.  Appears to be drug reaction.  Patient was on Depakote chronically. Antibiotic was narrowed to oral Augmentin.  No medication allergies listed. I will place him on oral prednisone 40 mg daily.  Seizure disorder Seizure-like activity on presentation.  Recently was taken off Depakote and now resumed as per neurology recommendation.  Head CT without acute findings.  Paroxysmal A. fib Metoprolol held due to low blood pressure and started on low-dose Cardizem.  Not on anticoagulation.  Hypothyroidism Continue Synthroid.  Bedbound status and failure to thrive Has moderate dementia.  He is extremely  deconditioned and bedbound.  Nutrition consulted.   DVT prophylaxis: Subcu Lovenox Code Status: DNR Family Communication: Wife and daughter-in-law at bedside Disposition: Needs further 24-hour monitoring given progression of his rash.  Possibly home tomorrow if improved.  Status is: Inpatient  Needs monitoring for another 24 hours given progression of rash.  Dispo: The patient is from: Home              Anticipated d/c is to: Home              Anticipated d/c date is: 4/19.              Patient currently: Stable and improving .not ready for discharge given progressive skin rash.        Consultants:   Surgery   Procedures: CT abdomen and pelvis  Antimicrobials: Augmentin  Subjective: Slept well overnight after receiving Ambien.  Not in distress.  Objective: Vitals:   11/02/19 1945 11/03/19 0543 11/03/19 0742 11/03/19 1243  BP: 124/79 102/61 (!) 103/57 (!) 101/58  Pulse: 97 (!) 45 96 (!) 117  Resp: _0 Temp: 97.6 F (36.4 C) 98.1 F (36.7 C) 98.7 F (37.1 C) 98.5 F (36.9 C)  TempSrc:      SpO2:  94% 96% 97%  Weight:      Height:        Intake/Output Summary (Last 24 hours) at 11/03/2019 1421 Last data filed at 11/02/2019 1700 Gross per 24 hour  Intake 240 ml  Output --  Net 240 ml   Filed Weights   10/31/19 0638 11/01/19 0527 11/02/19 0527  Weight: 63.5 kg 63.8 kg 64.6 kg    Examination: General: Elderly male  not in distress, irritable HEENT: Moist mucosa, supple neck Chest: Clear CVs: Normal S1-S2 GI: Soft, colostomy tube in place, nondistended, nontender, bowel sounds are Musculoskeletal: Diffuse progressive tiny erythematous macular rash over the back, arms and legs.  Minimal warmth but no tenderness.   Data Reviewed: I have personally reviewed following labs and imaging studies  CBC: Recent Labs  Lab 10/30/19 0207 10/31/19 0427 11/01/19 0553 11/02/19 0635 11/03/19 0510  WBC 9.8 8.5 7.4 9.8 9.8  NEUTROABS 7.1  --   --   --    --   HGB 9.6* 9.8* 9.4* 10.4* 10.2*  HCT 29.5* 30.4* 28.5* 31.4* 31.3*  MCV 103.1* 102.7* 100.7* 99.7 102.0*  PLT 147* 150 125* 150 841    Basic Metabolic Panel: Recent Labs  Lab 10/30/19 0207 10/31/19 0427 11/01/19 0553 11/02/19 0635 11/03/19 0510  NA 139 139 137 136 135  K 4.8 3.9 3.7 3.9 4.2  CL 107 106 105 101 102  CO2 _0 GLUCOSE 101* 104* 109* 121* 130*  BUN 24* _1 CREATININE 0.95 0.87 0.78 0.90 0.74  CALCIUM 8.2* 8.5* 8.0* 8.7* 8.7*  MG  --  1.8 2.1 2.0  --   PHOS  --  2.4* 2.5 2.6  --     GFR: Estimated Creatinine Clearance: 70.9 mL/min (by C-G formula based on SCr of 0.74 mg/dL).  Liver Function Tests: No results for input(s): AST, ALT, ALKPHOS, BILITOT, PROT, ALBUMIN in the last 168 hours.  CBG: No results for input(s): GLUCAP in the last 168 hours.   Recent Results (from the past 240 hour(s))  Blood culture (routine x 2)     Status: None   Collection Time: 10/29/19 12:25 PM   Specimen: BLOOD  Result Value Ref Range Status   Specimen Description BLOOD BLOOD LEFT FOREARM  Final   Special Requests   Final    BOTTLES DRAWN AEROBIC AND ANAEROBIC Blood Culture adequate volume   Culture   Final    NO GROWTH 5 DAYS Performed at [facility], [address] [place], [place] [address]    Report Status 11/03/2019 FINAL  Final  Blood culture (routine x 2)     Status: None   Collection Time: 10/29/19 12:25 PM   Specimen: BLOOD  Result Value Ref Range Status   Specimen Description BLOOD BLOOD RIGHT FOREARM  Final   Special Requests   Final    BOTTLES DRAWN AEROBIC AND ANAEROBIC Blood Culture adequate volume   Culture   Final    NO GROWTH 5 DAYS Performed at [facility], [address] [place], [place] [address]    Report Status 11/03/2019 FINAL  Final  Urine culture     Status: None   Collection Time: 10/29/19 12:25 PM   Specimen: Urine, Catheterized  Result Value Ref Range Status   Specimen Description   Final      URINE, CATHETERIZED Performed at [facility], [address] [place], [place] [address]    Special Requests   Final    NONE Performed at [facility], [address], [place], [place] [address]    Culture   Final    NO GROWTH Performed at [facility], [place] [address] [place], [place] [address]    Report Status 10/30/2019 FINAL  Final  Respiratory Panel by RT PCR (Flu A&B, Covid) - Nasopharyngeal Swab     Status: None   Collection Time: 10/29/19  1:58  PM   Specimen: Nasopharyngeal Swab  Result Value Ref Range Status   SARS Coronavirus 2 by RT PCR NEGATIVE NEGATIVE Final    Comment: (NOTE) SARS-CoV-2 target nucleic acids are NOT DETECTED. The SARS-CoV-2 RNA is generally detectable in upper respiratoy specimens during the acute phase of infection. The lowest concentration of SARS-CoV-2 viral copies this assay can detect is 131 copies/mL. A negative result does not preclude SARS-Cov-2 infection and should not be used as the sole basis for treatment or other patient management decisions. A negative result may occur with  improper specimen collection/handling, submission of specimen other than nasopharyngeal swab, presence of viral mutation(s) within the areas targeted by this assay, and inadequate number of viral copies (<131 copies/mL). A negative result must be combined with clinical observations, patient history, and epidemiological information. The expected result is Negative. Fact Sheet for Patients:  PinkCheek.be Fact Sheet for Healthcare Providers:  GravelBags.it This test is not yet ap proved or cleared by the [place] FDA and  has been authorized for detection and/or diagnosis of SARS-CoV-2 by FDA under an Emergency Use Authorization (EUA). This EUA will remain  in effect (meaning this test can be used) for the duration of the COVID-19 declaration under Section 564(b)(1) of the  Act, 21 U.S.C. section 360bbb-3(b)(1), unless the authorization is terminated or revoked sooner.    Influenza A by PCR NEGATIVE NEGATIVE Final   Influenza B by PCR NEGATIVE NEGATIVE Final    Comment: (NOTE) The Xpert Xpress SARS-CoV-2/FLU/RSV assay is intended as an aid in  the diagnosis of influenza from Nasopharyngeal swab specimens and  should not be used as a sole basis for treatment. Nasal washings and  aspirates are unacceptable for Xpert Xpress SARS-CoV-2/FLU/RSV  testing. Fact Sheet for Patients: PinkCheek.be Fact Sheet for Healthcare Providers: GravelBags.it This test is not yet approved or cleared by the [place] FDA and  has been authorized for detection and/or diagnosis of SARS-CoV-2 by  FDA under an Emergency Use Authorization (EUA). This EUA will remain  in effect (meaning this test can be used) for the duration of the  Covid-19 declaration under Section 564(b)(1) of the Act, 21  U.S.C. section 360bbb-3(b)(1), unless the authorization is  terminated or revoked. Performed at [facility], [address] [place], [place] [address]          Radiology Studies: No results found.      Scheduled Meds: . amoxicillin-clavulanate  1 tablet Oral Q12H  . diltiazem  30 mg Oral Q6H  . divalproex  250 mg Oral Q12H  . DULoxetine  20 mg Oral BID  . enoxaparin (LOVENOX) injection  40 mg Subcutaneous Q24H  . feeding supplement (ENSURE ENLIVE)  237 mL Oral BID BM  . hydrOXYzine  10 mg Oral TID  . levothyroxine  100 mcg Oral Once per day on Sun Tue Thu  . levothyroxine  88 mcg Oral Once per day on Mon Wed Fri Sat  . Lidocaine HCl  1 application Apply externally TID  . liver oil-zinc oxide  1 application Topical See admin instructions  . polyethylene glycol  17 g Oral BID  . predniSONE  40 mg Oral Q breakfast  . senna  2 tablet Oral QHS  . traZODone  50 mg Oral QHS  . zolpidem  5 mg Oral QHS    Continuous Infusions:   LOS: 4 days    Time spent: 25 minutes    Dolora Ridgely, MD [facility]   To contact the attending  provider between 7A-7P or the covering provider during after hours 7P-7A, please log into the web site [email or web address] and access using universal [place] password for that web site. If you do not have the password, please call the hospital operator.  11/03/2019, 2:21 PM

## 2019-11-04 DIAGNOSIS — A419 Sepsis, unspecified organism: Principal | ICD-10-CM

## 2019-11-04 DIAGNOSIS — L270 Generalized skin eruption due to drugs and medicaments taken internally: Secondary | ICD-10-CM | POA: Diagnosis not present

## 2019-11-04 DIAGNOSIS — R627 Adult failure to thrive: Secondary | ICD-10-CM | POA: Diagnosis present

## 2019-11-04 MED ORDER — DIVALPROEX SODIUM 250 MG PO DR TAB: 250.0000 mg | DELAYED_RELEASE_TABLET | Freq: Two times a day (BID) | ORAL | 1 refills | Status: AC

## 2019-11-04 MED ORDER — DOXYCYCLINE HYCLATE 100 MG PO TABS: 100.0000 mg | ORAL_TABLET | Freq: Two times a day (BID) | ORAL | 0 refills | Status: DC

## 2019-11-04 MED ORDER — PREDNISONE 20 MG PO TABS: 40.0000 mg | ORAL_TABLET | Freq: Every day | ORAL | 0 refills | Status: AC

## 2019-11-04 MED ORDER — DOXYCYCLINE HYCLATE 100 MG PO TABS: 100.0000 mg | ORAL_TABLET | Freq: Two times a day (BID) | ORAL | 0 refills | Status: AC

## 2019-11-04 MED ORDER — LIDOCAINE 4 % EX CREA
TOPICAL_CREAM | Freq: Three times a day (TID) | CUTANEOUS | Status: DC
  Filled 2019-11-04: qty 5

## 2019-11-04 NOTE — Care Management Important Message (Signed)
Important Message  Patient Details  Name: Jason Harmon MRN: [id number] Date of Birth: 23-Aug-1940   Medicare Important Message Given:  Yes     [name] 11/04/2019, 12:18 PM

## 2019-11-04 NOTE — Progress Notes (Signed)
   11/04/19 0915  Clinical Encounter Type  Visited With Patient and family together  Visit Type Follow-up  Referral From Chaplain  Consult/Referral To Chaplain  Spiritual Encounters  Spiritual Needs Emotional  Pt, daughter in law and wife were present upon arrival. Family shared their enthusiasm in pt's improvement in health. Pastoral visits are appreciated.

## 2019-11-04 NOTE — Discharge Summary (Signed)
Physician Discharge Summary  Jason Harmon [id number] DOB: 03-04-1941 DOA: 10/29/2019  PCP: System, Pcp Not In  Admit date: 10/29/2019 Discharge date: 11/04/2019  Admitted From: Home Disposition: Home Recommendations for Outpatient Follow-up:  1. Follow up with PCP in 1-2 weeks   Home Health: Has home health established Equipment/Devices: Wheelchair at home  Discharge Condition: Fair CODE STATUS: DNR Diet recommendation: Regular   Discharge Diagnoses:  Principal Problem: Sepsis without acute target organ damage   Active Problems:   Seizure (HCC) Paroxysmal atrial fibrillation (HCC)   Hypothyroidism   Lactic acidosis   Hypothermia   Pneumoperitoneum   Drug-induced skin rash   FTT (failure to thrive) in adult  Brief narrative/HPI 79 year old male with history of seizures who was taken off medication for past 2 months, hypothyroidism, bedbound status, A. 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 Hospital course prolonged due to development of sepsis with pneumoperitoneum.  Had lactic acidosis on presentation.  Hospital course  Principal problem Sepsis secondary to pneumoperitoneum Elevated lactic acid on presentation improved with IV fluids.  CT abdomen pelvis showing free intraperitoneal air at the central transverse colon and left lower quadrant with perforation of uncertain location.  Suspected anastomotic site in the left mid abdomen. Sepsis resolved. Was placed on IV vancomycin, cefepime and Flagyl and then transition to IV Zosyn.  Now transition to oral Augmentin with plan to treat for 7 days. Given concern for rash secondary to antibiotics will discharge on oral doxycycline to complete antibiotic course (until tomorrow).  Active problems Macular rash Noted on 4/17.  Patient afebrile but has developed areas of macular rash over the  limbs and back which have progressed today.  They are slightly warm to touch but nontender and nonpruritic.  Appears to be associated with antibiotics (was on 4 different ones during hospital stay)..  Patient was on Depakote chronically and unlikely contributing to the rash. Placed on oral prednisone 40 mg daily for 5 days.  Rash unchanged from yesterday except for some improvement in the trunk. Follow-up as outpatient.  Seizure disorder Seizure-like activity on presentation.  Recently was taken off Depakote and now resumed as per neurology recommendation.  Head CT without acute findings.  Paroxysmal A. fib Metoprolol held due to low blood pressure and started on low-dose Cardizem.  Not on anticoagulation.  Blood pressure has remained stable for past few days and I will resume his home dose metoprolol once a day.  Hypothyroidism Continue Synthroid.  Bedbound status and failure to thrive Has moderate dementia.  He is extremely deconditioned and bedbound.    Added supplement.    Family Communication: Wife and daughter-in-law at bedside  Consults: Neurology Procedure: CT head, CT abdomen pelvis  Discharge Instructions   Allergies as of 11/04/2019      Reactions   Vancomycin Rash   Zosyn [piperacillin Sod-tazobactam So] Rash      Medication List    TAKE these medications   acetaminophen 650 MG CR tablet Commonly known as: TYLENOL Take 650 mg by mouth every 8 (eight) hours as needed for pain.   Aspercreme Lidocaine 4 % Crea Generic drug: Lidocaine HCl Apply topically 3 (three) times daily.   bisacodyl 10 MG suppository Commonly known as: DULCOLAX Place 10 mg rectally 2 (two) times a week.   divalproex 250 MG DR tablet Commonly known as: DEPAKOTE Take 1 tablet (250 mg total) by mouth every 12 (twelve) hours.   doxycycline  100 MG tablet Commonly known as: VIBRA-TABS Take 1 tablet (100 mg total) by mouth 2 (two) times daily for 2 days.   DULoxetine 20 MG  capsule Commonly known as: CYMBALTA Take 20 mg by mouth 2 (two) times daily.   ENSURE ENLIVE PO Take 237 mLs by mouth in the morning and at bedtime.   Levothyroxine Sodium 88 MCG Caps Take by mouth daily before breakfast. Mon Wed Fri and Sat   levothyroxine 100 MCG tablet Commonly known as: SYNTHROID Take 100 mcg by mouth daily before breakfast. Sun Tue Thurs   liver oil-zinc oxide 40 % ointment Commonly known as: DESITIN Apply 1 application topically See admin instructions. Apply to irritated areas of gluteal and peri-anal skin with each diaper change until healed   Metoprolol Succinate 25 MG Cs24 Take 25 mg by mouth daily.   polyethylene glycol 17 g packet Commonly known as: MIRALAX / GLYCOLAX Take 17 g by mouth 2 (two) times daily.   predniSONE 20 MG tablet Commonly known as: DELTASONE Take 2 tablets (40 mg total) by mouth daily with breakfast for 5 days. Start taking on: November 05, 2019   senna 8.6 MG Tabs tablet Commonly known as: SENOKOT Take 2 tablets by mouth at bedtime.   simethicone 80 MG chewable tablet Commonly known as: MYLICON Chew 80 mg by mouth every 6 (six) hours as needed for flatulence.   traZODone 50 MG tablet Commonly known as: DESYREL Take 50 mg by mouth at bedtime. Can also take 1/2 tablet if needed for insomnia      Follow-up Information    follow up with [facility] Follow up in 1 week(s).          Allergies  Allergen Reactions  . Vancomycin Rash  . Zosyn [Piperacillin Sod-Tazobactam So] Rash      Procedures/Studies: DG Chest 1 View  Result Date: 10/29/2019 CLINICAL DATA:  Altered mental status, episode of gazing and purple lips, history atrial fibrillation EXAM: CHEST  1 VIEW COMPARISON:  10/15/2013 FINDINGS: Rotated to the LEFT. Normal heart size, mediastinal contours, and pulmonary vascularity. Lungs grossly clear. No infiltrate, pleural effusion or pneumothorax. Osseous structures unremarkable. IMPRESSION: No acute abnormalities.  Electronically Signed   By: Lavonia Dana M.D.   On: 10/29/2019 10:59   CT Head Wo Contrast  Result Date: 10/29/2019 CLINICAL DATA:  Cephalopathy.  Patient nonverbal. EXAM: CT HEAD WITHOUT CONTRAST TECHNIQUE: Contiguous axial images were obtained from the base of the skull through the vertex without intravenous contrast. Best images obtainable with all immobilization techniques utilized. Patient combative. COMPARISON:  05/05/2014 FINDINGS: Moderate motion artifact is present despite multiple scanning attempts making it difficult to exclude acute subarachnoid hemorrhage. Brain: There is mild age related atrophic change in mild chronic ischemic microvascular disease. There is no mass, mass effect, shift of midline structures or acute infarction. No definite parenchymal hemorrhage or subdural collection. Vascular: No hyperdense vessel or unexpected calcification. Skull: Normal. Negative for fracture or focal lesion. Sinuses/Orbits: No acute finding. Other: None. IMPRESSION: 1. No acute findings, although moderate motion artifact is present as described above. 2. Mild age related atrophy and chronic ischemic microvascular disease. Electronically Signed   By: Marin Olp M.D.   On: 10/29/2019 10:51   CT ABDOMEN PELVIS W CONTRAST  Result Date: 10/30/2019 CLINICAL DATA:  Postoperative abdominal pain and fever EXAM: CT ABDOMEN AND PELVIS WITH CONTRAST TECHNIQUE: Multidetector CT imaging of the abdomen and pelvis was performed using the standard protocol following bolus administration of intravenous contrast. CONTRAST:  122mL OMNIPAQUE IOHEXOL 300 MG/ML  SOLN COMPARISON:  None. FINDINGS: LOWER CHEST: Small right pleural effusion. Nodular opacity at the lateral right lung base measures 1.2 cm, probably atelectasis. HEPATOBILIARY: Normal hepatic contours. No intra- or extrahepatic biliary dilatation. The gallbladder is normal. PANCREAS: Normal pancreas. No ductal dilatation or peripancreatic fluid collection. SPLEEN:  Normal. ADRENALS/URINARY TRACT: The adrenal glands are normal. No hydronephrosis, nephroureterolithiasis or solid renal mass. The urinary bladder is normal for degree of distention STOMACH/BOWEL: Left lower quadrant ostomy. No small bowel dilatation. There is free intraperitoneal air adjacent to the transverse colon. There is also a small amount of free air visible in the left lower quadrant. VASCULAR/LYMPHATIC: Normal course and caliber of the major abdominal vessels. No abdominal or pelvic lymphadenopathy. REPRODUCTIVE: Enlarged prostate measures 6.3 cm in transverse dimension. MUSCULOSKELETAL. Severe osseous overgrowth of Harmon hips, right greater than left. OTHER: None. IMPRESSION: 1. Free intraperitoneal air adjacent to the transverse colon and small amount of free air in the left lower quadrant, consistent with perforation of uncertain location. Anastomotic site in the left mid abdomen is most likely. 2. Small right pleural effusion. 3. Severe osseous overgrowth of Harmon hips, right greater than left. Critical Value/emergent results were called by telephone at the time of interpretation on 10/30/2019 at 12:48 am to provider [facility] , who verbally acknowledged these results. Electronically Signed   By: Ulyses Jarred M.D.   On: 10/30/2019 00:50       Subjective: More alert and awake today, responding to commands.  Not irritable.  Discharge Exam: Vitals:   11/03/19 1944 11/04/19 0522  BP: (!) 100/58 116/75  Pulse: 91 (!) 117  Resp: 18 20  Temp: 98.6 F (37 C) 98 F (36.7 C)  SpO2: 99% 97%   Vitals:   11/03/19 1720 11/03/19 1827 11/03/19 1944 11/04/19 0522  BP: 96/69  (!) 100/58 116/75  Pulse:   91 (!) 117  Resp:   18 20  Temp:  99 F (37.2 C) 98.6 F (37 C) 98 F (36.7 C)  TempSrc:  Axillary Oral Oral  SpO2:   99% 97%  Weight:      Height:        General: Elderly male, poorly verbal, not in distress HEENT: Moist mucosa, supple neck Chest: Clear bilaterally CVs: Normal  S1-S2, no murmurs GI: Soft, colostomy bag with good output, nontender, nondistended Musculoskeletal: Diffuse tiny macular rash over the trunk and extremities, no warmth, tenderness or blanching. CNS: Alert and awake, oriented x0.   The results of significant diagnostics from this hospitalization (including imaging, microbiology, ancillary and laboratory) are listed below for reference.     Microbiology: Recent Results (from the past 240 hour(s))  Blood culture (routine x 2)     Status: None   Collection Time: 10/29/19 12:25 PM   Specimen: BLOOD  Result Value Ref Range Status   Specimen Description BLOOD BLOOD LEFT FOREARM  Final   Special Requests   Final    BOTTLES DRAWN AEROBIC AND ANAEROBIC Blood Culture adequate volume   Culture   Final    NO GROWTH 5 DAYS Performed at [facility], [address], [place], [place] [address]    Report Status 11/03/2019 FINAL  Final  Blood culture (routine x 2)     Status: None   Collection Time: 10/29/19 12:25 PM   Specimen: BLOOD  Result Value Ref Range Status   Specimen Description BLOOD BLOOD RIGHT FOREARM  Final   Special Requests   Final  BOTTLES DRAWN AEROBIC AND ANAEROBIC Blood Culture adequate volume   Culture   Final    NO GROWTH 5 DAYS Performed at [facility], [address] [place], [place] [address]    Report Status 11/03/2019 FINAL  Final  Urine culture     Status: None   Collection Time: 10/29/19 12:25 PM   Specimen: Urine, Catheterized  Result Value Ref Range Status   Specimen Description   Final    URINE, CATHETERIZED Performed at [facility], [address], [place], [place] [address]    Special Requests   Final    NONE Performed at [facility], [address], [place], [place] [address]    Culture   Final    NO GROWTH Performed at [facility], [place] [address], [place], [place] [address]    Report Status 10/30/2019 FINAL  Final  Respiratory Panel by RT PCR  (Flu A&B, Covid) - Nasopharyngeal Swab     Status: None   Collection Time: 10/29/19  1:58 PM   Specimen: Nasopharyngeal Swab  Result Value Ref Range Status   SARS Coronavirus 2 by RT PCR NEGATIVE NEGATIVE Final    Comment: (NOTE) SARS-CoV-2 target nucleic acids are NOT DETECTED. The SARS-CoV-2 RNA is generally detectable in upper respiratoy specimens during the acute phase of infection. The lowest concentration of SARS-CoV-2 viral copies this assay can detect is 131 copies/mL. A negative result does not preclude SARS-Cov-2 infection and should not be used as the sole basis for treatment or other patient management decisions. A negative result may occur with  improper specimen collection/handling, submission of specimen other than nasopharyngeal swab, presence of viral mutation(s) within the areas targeted by this assay, and inadequate number of viral copies (<131 copies/mL). A negative result must be combined with clinical observations, patient history, and epidemiological information. The expected result is Negative. Fact Sheet for Patients:  PinkCheek.be Fact Sheet for Healthcare Providers:  GravelBags.it This test is not yet ap proved or cleared by the [place] FDA and  has been authorized for detection and/or diagnosis of SARS-CoV-2 by FDA under an Emergency Use Authorization (EUA). This EUA will remain  in effect (meaning this test can be used) for the duration of the COVID-19 declaration under Section 564(b)(1) of the Act, 21 U.S.C. section 360bbb-3(b)(1), unless the authorization is terminated or revoked sooner.    Influenza A by PCR NEGATIVE NEGATIVE Final   Influenza B by PCR NEGATIVE NEGATIVE Final    Comment: (NOTE) The Xpert Xpress SARS-CoV-2/FLU/RSV assay is intended as an aid in  the diagnosis of influenza from Nasopharyngeal swab specimens and  should not be used as a sole basis for treatment. Nasal  washings and  aspirates are unacceptable for Xpert Xpress SARS-CoV-2/FLU/RSV  testing. Fact Sheet for Patients: PinkCheek.be Fact Sheet for Healthcare Providers: GravelBags.it This test is not yet approved or cleared by the [place] FDA and  has been authorized for detection and/or diagnosis of SARS-CoV-2 by  FDA under an Emergency Use Authorization (EUA). This EUA will remain  in effect (meaning this test can be used) for the duration of the  Covid-19 declaration under Section 564(b)(1) of the Act, 21  U.S.C. section 360bbb-3(b)(1), unless the authorization is  terminated or revoked. Performed at [facility], [address] [place],  [address]      Labs: BNP (last 3 results) No results for input(s): BNP in the last 8760 hours. Basic Metabolic Panel: Recent Labs  Lab 10/30/19  0207 10/31/19 0427 11/01/19 0553 11/02/19 0635 11/03/19 0510  NA 139 139 137 136 135  K 4.8 3.9 3.7 3.9 4.2  CL 107 106 105 101 102  CO2 25 26 24 27 24   GLUCOSE 101* 104* 109* 121* 130*  BUN 24* 18 13 9 9   CREATININE 0.95 0.87 0.78 0.90 0.74  CALCIUM 8.2* 8.5* 8.0* 8.7* 8.7*  MG  --  1.8 2.1 2.0  --   PHOS  --  2.4* 2.5 2.6  --    Liver Function Tests: No results for input(s): AST, ALT, ALKPHOS, BILITOT, PROT, ALBUMIN in the last 168 hours. No results for input(s): LIPASE, AMYLASE in the last 168 hours. No results for input(s): AMMONIA in the last 168 hours. CBC: Recent Labs  Lab 10/30/19 0207 10/31/19 0427 11/01/19 0553 11/02/19 0635 11/03/19 0510  WBC 9.8 8.5 7.4 9.8 9.8  NEUTROABS 7.1  --   --   --   --   HGB 9.6* 9.8* 9.4* 10.4* 10.2*  HCT 29.5* 30.4* 28.5* 31.4* 31.3*  MCV 103.1* 102.7* 100.7* 99.7 102.0*  PLT 147* 150 125* 150 158   Cardiac Enzymes: No results for input(s): CKTOTAL, CKMB, CKMBINDEX, TROPONINI in the last 168 hours. BNP: Invalid input(s): POCBNP CBG: No results for input(s):  GLUCAP in the last 168 hours. D-Dimer No results for input(s): DDIMER in the last 72 hours. Hgb A1c No results for input(s): HGBA1C in the last 72 hours. Lipid Profile No results for input(s): CHOL, HDL, LDLCALC, TRIG, CHOLHDL, LDLDIRECT in the last 72 hours. Thyroid function studies No results for input(s): TSH, T4TOTAL, T3FREE, THYROIDAB in the last 72 hours.  Invalid input(s): FREET3 Anemia work up No results for input(s): VITAMINB12, FOLATE, FERRITIN, TIBC, IRON, RETICCTPCT in the last 72 hours. Urinalysis    Component Value Date/Time   COLORURINE YELLOW (A) 10/29/2019 1225   APPEARANCEUR CLOUDY (A) 10/29/2019 1225   APPEARANCEUR Clear 03/15/2014 1331   LABSPEC 1.025 10/29/2019 1225   LABSPEC 1.017 03/15/2014 1331   PHURINE 7.0 10/29/2019 1225   GLUCOSEU NEGATIVE 10/29/2019 1225   GLUCOSEU 150 mg/dL 03/15/2014 1331   HGBUR NEGATIVE 10/29/2019 1225   BILIRUBINUR NEGATIVE 10/29/2019 1225   BILIRUBINUR Negative 03/15/2014 1331   KETONESUR NEGATIVE 10/29/2019 1225   PROTEINUR 30 (A) 10/29/2019 1225   UROBILINOGEN 0.2 10/18/2013 1356   NITRITE NEGATIVE 10/29/2019 1225   LEUKOCYTESUR NEGATIVE 10/29/2019 1225   LEUKOCYTESUR Negative 03/15/2014 1331   Sepsis Labs Invalid input(s): PROCALCITONIN,  WBC,  LACTICIDVEN Microbiology Recent Results (from the past 240 hour(s))  Blood culture (routine x 2)     Status: None   Collection Time: 10/29/19 12:25 PM   Specimen: BLOOD  Result Value Ref Range Status   Specimen Description BLOOD BLOOD LEFT FOREARM  Final   Special Requests   Final    BOTTLES DRAWN AEROBIC AND ANAEROBIC Blood Culture adequate volume   Culture   Final    NO GROWTH 5 DAYS Performed at [facility], [address], [place], [place] [address]    Report Status 11/03/2019 FINAL  Final  Blood culture (routine x 2)     Status: None   Collection Time: 10/29/19 12:25 PM   Specimen: BLOOD  Result Value Ref Range Status   Specimen Description BLOOD BLOOD  RIGHT FOREARM  Final   Special Requests   Final    BOTTLES DRAWN AEROBIC AND ANAEROBIC Blood Culture adequate volume   Culture   Final    NO GROWTH 5 DAYS  Performed at [facility], [address], [place], [place] [address]    Report Status 11/03/2019 FINAL  Final  Urine culture     Status: None   Collection Time: 10/29/19 12:25 PM   Specimen: Urine, Catheterized  Result Value Ref Range Status   Specimen Description   Final    URINE, CATHETERIZED Performed at [facility], [address] [place], [place] [address]    Special Requests   Final    NONE Performed at [facility], [address] [place], [place] [address]    Culture   Final    NO GROWTH Performed at [facility], [place] [address] [place], [place] [address]    Report Status 10/30/2019 FINAL  Final  Respiratory Panel by RT PCR (Flu A&B, Covid) - Nasopharyngeal Swab     Status: None   Collection Time: 10/29/19  1:58 PM   Specimen: Nasopharyngeal Swab  Result Value Ref Range Status   SARS Coronavirus 2 by RT PCR NEGATIVE NEGATIVE Final    Comment: (NOTE) SARS-CoV-2 target nucleic acids are NOT DETECTED. The SARS-CoV-2 RNA is generally detectable in upper respiratoy specimens during the acute phase of infection. The lowest concentration of SARS-CoV-2 viral copies this assay can detect is 131 copies/mL. A negative result does not preclude SARS-Cov-2 infection and should not be used as the sole basis for treatment or other patient management decisions. A negative result may occur with  improper specimen collection/handling, submission of specimen other than nasopharyngeal swab, presence of viral mutation(s) within the areas targeted by this assay, and inadequate number of viral copies (<131 copies/mL). A negative result must be combined with clinical observations, patient history, and epidemiological information. The expected result is Negative. Fact Sheet for Patients:   PinkCheek.be Fact Sheet for Healthcare Providers:  GravelBags.it This test is not yet ap proved or cleared by the [place] FDA and  has been authorized for detection and/or diagnosis of SARS-CoV-2 by FDA under an Emergency Use Authorization (EUA). This EUA will remain  in effect (meaning this test can be used) for the duration of the COVID-19 declaration under Section 564(b)(1) of the Act, 21 U.S.C. section 360bbb-3(b)(1), unless the authorization is terminated or revoked sooner.    Influenza A by PCR NEGATIVE NEGATIVE Final   Influenza B by PCR NEGATIVE NEGATIVE Final    Comment: (NOTE) The Xpert Xpress SARS-CoV-2/FLU/RSV assay is intended as an aid in  the diagnosis of influenza from Nasopharyngeal swab specimens and  should not be used as a sole basis for treatment. Nasal washings and  aspirates are unacceptable for Xpert Xpress SARS-CoV-2/FLU/RSV  testing. Fact Sheet for Patients: PinkCheek.be Fact Sheet for Healthcare Providers: GravelBags.it This test is not yet approved or cleared by the [place] FDA and  has been authorized for detection and/or diagnosis of SARS-CoV-2 by  FDA under an Emergency Use Authorization (EUA). This EUA will remain  in effect (meaning this test can be used) for the duration of the  Covid-19 declaration under Section 564(b)(1) of the Act, 21  U.S.C. section 360bbb-3(b)(1), unless the authorization is  terminated or revoked. Performed at [facility], [address], [place],  [address]      Time coordinating discharge: 35 minutes  SIGNED:   Louellen Molder, MD  [facility] 11/04/2019, 10:35 AM Pager   If 7PM-7AM, please contact night-coverage [email or web address] Password [email or web address]

## 2019-11-04 NOTE — TOC Transition Note (Signed)
Transition of Care [facility]) - CM/SW Discharge Note   Patient Details  Name: Jason Harmon MRN: [id number] Date of Birth: 07/02/1941  Transition of Care [facility]) CM/SW Contact:  Beverly Sessions, RN Phone Number: 11/04/2019, 10:59 AM   Clinical Narrative:     Patient to discharge home today Robin with [facility] has arranged [facility] transport between 1-1:30.  They will take him to the [facility] center to be assessed by their provider after discharge.   [facility] will bring WC to the patient's room and transfer him via hoyer lift.   Per bedside RN patient's family is in the room and aware that he will be discharging today   Final next level of care: Home/Self Care([facility]) Barriers to Discharge: No Barriers Identified   Patient Goals and CMS Choice        Discharge Placement                       Discharge Plan and Services                                     Social Determinants of Health (SDOH) Interventions     Readmission Risk Interventions No flowsheet data found.

## 2019-11-04 NOTE — Progress Notes (Signed)
Pt discharged per MD order. IV removed. Discharge instructions reviewed with pts wife/caregiver and his granddaughter. Wife verbalized understanding with all questions answered to her satisfaction. Pt placed in wheelchair and transported via wheelchair to the [facility].

## 2020-07-09 ENCOUNTER — Other Ambulatory Visit: Payer: Self-pay

## 2020-07-09 ENCOUNTER — Emergency Department
Admission: EM | Admit: 2020-07-09 | Discharge: 2020-07-09 | Disposition: A | Discharge status: Home / Self Care | Payer: Medicare (Managed Care) | Attending: Emergency Medicine | Admitting: Emergency Medicine

## 2020-07-09 DIAGNOSIS — Z79899 Other long term (current) drug therapy: Secondary | ICD-10-CM | POA: Insufficient documentation

## 2020-07-09 DIAGNOSIS — R569 Unspecified convulsions: Secondary | ICD-10-CM | POA: Diagnosis present

## 2020-07-09 DIAGNOSIS — E039 Hypothyroidism, unspecified: Secondary | ICD-10-CM | POA: Diagnosis not present

## 2020-07-09 LAB — CBC WITH DIFFERENTIAL/PLATELET
Abs Immature Granulocytes: 0.15 10*3/uL — ABNORMAL HIGH (ref 0.00–0.07)
Basophils Absolute: 0 10*3/uL (ref 0.0–0.1)
Basophils Relative: 0 %
Eosinophils Absolute: 0.2 10*3/uL (ref 0.0–0.5)
Eosinophils Relative: 2 %
HCT: 29.6 % — ABNORMAL LOW (ref 39.0–52.0)
Hemoglobin: 9.6 g/dL — ABNORMAL LOW (ref 13.0–17.0)
Immature Granulocytes: 2 %
Lymphocytes Relative: 26 %
Lymphs Abs: 2.5 10*3/uL (ref 0.7–4.0)
MCH: 32.7 pg (ref 26.0–34.0)
MCHC: 32.4 g/dL (ref 30.0–36.0)
MCV: 100.7 fL — ABNORMAL HIGH (ref 80.0–100.0)
Monocytes Absolute: 0.9 10*3/uL (ref 0.1–1.0)
Monocytes Relative: 9 %
Neutro Abs: 6.2 10*3/uL (ref 1.7–7.7)
Neutrophils Relative %: 61 %
Platelets: 175 10*3/uL (ref 150–400)
RBC: 2.94 MIL/uL — ABNORMAL LOW (ref 4.22–5.81)
RDW: 12.5 % (ref 11.5–15.5)
WBC: 10 10*3/uL (ref 4.0–10.5)
nRBC: 0 % (ref 0.0–0.2)

## 2020-07-09 LAB — BASIC METABOLIC PANEL
Anion gap: 10 (ref 5–15)
BUN: 29 mg/dL — ABNORMAL HIGH (ref 8–23)
CO2: 27 mmol/L (ref 22–32)
Calcium: 9 mg/dL (ref 8.9–10.3)
Chloride: 101 mmol/L (ref 98–111)
Creatinine, Ser: 0.87 mg/dL (ref 0.61–1.24)
GFR, Estimated: >60 mL/min (ref >60)
Glucose, Bld: 127 mg/dL — ABNORMAL HIGH (ref 70–99)
Potassium: 4.9 mmol/L (ref 3.5–5.1)
Sodium: 138 mmol/L (ref 135–145)

## 2020-07-09 LAB — VALPROIC ACID LEVEL: Valproic Acid Lvl: 37 ug/mL — ABNORMAL LOW (ref 50.0–100.0)

## 2020-07-09 MED ORDER — LEVETIRACETAM IN NACL 1000 MG/100ML IV SOLN
1000.0000 mg | Freq: Once | INTRAVENOUS | Status: AC
  Administered 2020-07-09: 1000 mg via INTRAVENOUS
  Filled 2020-07-09: qty 100

## 2020-07-09 MED ORDER — LORAZEPAM 2 MG/ML IJ SOLN
2.0000 mg | Freq: Once | INTRAMUSCULAR | Status: AC
  Administered 2020-07-09: 2 mg via INTRAVENOUS

## 2020-07-09 MED ORDER — LORAZEPAM 2 MG/ML IJ SOLN
INTRAMUSCULAR | Status: AC
  Filled 2020-07-09: qty 1

## 2020-07-09 MED ORDER — VALPROATE SODIUM 100 MG/ML IV SOLN
250.0000 mg | Freq: Once | INTRAVENOUS | Status: AC
  Administered 2020-07-09: 250 mg via INTRAVENOUS
  Filled 2020-07-09: qty 2.5

## 2020-07-09 MED ORDER — LACTATED RINGERS IV BOLUS: 1000.0000 mL | Freq: Once | INTRAVENOUS | Status: DC

## 2020-07-09 NOTE — ED Notes (Signed)
BP cuff size changed to appropriate size and BP reading is WNL at this time.

## 2020-07-09 NOTE — ED Notes (Signed)
Pure wick placed on pt to collect urine and prevent skin breakdown. Pt legs extremely contracted and unable to open. Pillow between the legs at this time.

## 2020-07-09 NOTE — Discharge Instructions (Addendum)
It appears Jason Harmon Depakote level subtherapeutic today.  He was given a loading dose in the emergency room.  This will get him to a therapeutic level.  He is not sure he is taking his recommended dose twice a day as directed and follow-up with neurologist for any concerns about difficulty with medication. Please follow-up within the next 2 weeks to have this valproic acid level rechecked.

## 2020-07-09 NOTE — ED Provider Notes (Signed)
[facility] Emergency Department Provider Note  ____________________________________________   Event Date/Time   First MD Initiated Contact with Patient 07/09/20 [phone]     (approximate)  I have reviewed the triage vital signs and the nursing notes.   HISTORY  Chief Complaint Seizures   HPI Jason Harmon is a 79 y.o. male with history of seizures who was taken off medication for past 2 months, hypothyroidism, bedbound status, A. 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  Per EMS patient had 2 witnessed seizure-like activity episodes today.  1st episode occurred around midnight and resolved after couple minutes without any medications and another episode occurred around 3 AM.  Both episodes were described by family at bedside this generalized shaking.  No family is immediately available patient arrival.  Patient is unable provide any history on arrival secondary to baseline nonverbal status.          Past Medical History:  Diagnosis Date  . A-fib ([place])   . Acute renal failure ([place])   . Ischemic bowel disease ([place])   . Renal disorder   . Sepsis [facility])     Patient Active Problem List   Diagnosis Date Noted  . Drug-induced skin rash 11/04/2019  . FTT (failure to thrive) in adult 11/04/2019  . Pneumoperitoneum 10/30/2019  . Hypothyroidism 10/29/2019  . Seizure ([place]) 10/29/2019  . Lactic acidosis 10/29/2019  . Hypothermia 10/29/2019  . Pleural effusion 09/11/2013  . Palliative care encounter 09/03/2013  . 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 Hemoptysis 08/26/2013  . Bowel perforation ([place]) 08/18/2013  . Acute and chronic respiratory failure 08/18/2013  . Septic shock ([place]) 08/18/2013  . Pulmonary edema 08/12/2013  . Pneumonia 08/12/2013  . Acute respiratory failure ([place]) 08/12/2013   . Tracheostomy status ([place]) 08/12/2013  . Pneumonia, organism unspecified([place]) 08/12/2013  . Acute on chronic respiratory failure ([place]) 08/09/2013  . Acute respiratory failure following trauma and surgery ([place]) 08/07/2013  . Atrial fibrillation ([place]) 08/07/2013  . 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 Hyperglycemia 08/07/2013  . Acute renal failure ([place]) 08/07/2013  . Acute blood loss anemia 08/07/2013  . Acute vascular insufficiency of intestine ([place]) 08/07/2013    Past Surgical History:  Procedure Laterality Date  . ABDOMINAL SURGERY    . COLOSTOMY N/A 08/20/2013   Procedure: COLOSTOMY;  Surgeon: Rolm Bookbinder, MD;  Location: [place];  Service: General;  Laterality: N/A;  . GASTROSTOMY N/A 08/20/2013   Procedure: G-TUBE PLACEMENT;  Surgeon: Rolm Bookbinder, MD;  Location: [place];  Service: General;  Laterality: N/A;  . LAPAROTOMY N/A 08/18/2013   Procedure: EXPLORATORY LAPAROTOMY;  Surgeon: Joyice Faster. Cornett, MD;  Location: [place];  Service: General;  Laterality: N/A;  . LAPAROTOMY N/A 08/20/2013   Procedure: EXPLORATORY LAPAROTOMY WITH  SMALL BOWEL RESECTION;  Surgeon: Rolm Bookbinder, MD;  Location: [place];  Service: General;  Laterality: N/A;    Prior to Admission medications   Medication Sig Start Date End Date Taking? Authorizing Provider  acetaminophen (TYLENOL) 650 MG CR tablet Take 650 mg by mouth every 8 (eight) hours as needed for pain.    [provider]  bisacodyl (DULCOLAX) 10 MG suppository Place 10 mg rectally 2 (two) times a week.    [provider]  divalproex (DEPAKOTE) 250 MG DR tablet Take 1 tablet (250 mg total) by mouth every 12 (twelve) hours. 11/04/19   Dhungel,  Nishant, MD  DULoxetine (CYMBALTA) 20 MG capsule Take 20 mg by mouth 2 (two) times daily.    [provider]  levothyroxine (SYNTHROID, LEVOTHROID) 100 MCG tablet Take 100 mcg by mouth daily before breakfast. [place] Reek Thurs    [provider]   Levothyroxine Sodium 88 MCG CAPS Take by mouth daily before breakfast. Mon Wed Fri and Sat    [provider]  Lidocaine HCl (ASPERCREME LIDOCAINE) 4 % CREA Apply topically 3 (three) times daily.     [provider]  liver oil-zinc oxide (DESITIN) 40 % ointment Apply 1 application topically See admin instructions. Apply to irritated areas of gluteal and peri-anal skin with each diaper change until healed    [provider]  Metoprolol Succinate 25 MG CS24 Take 25 mg by mouth daily.     [provider]  Nutritional Supplements (ENSURE ENLIVE PO) Take 237 mLs by mouth in the morning and at bedtime.     [provider]  polyethylene glycol (MIRALAX / GLYCOLAX) packet Take 17 g by mouth 2 (two) times daily.    [provider]  senna (SENOKOT) 8.6 MG TABS tablet Take 2 tablets by mouth at bedtime.    [provider]  simethicone (MYLICON) 80 MG chewable tablet Chew 80 mg by mouth every 6 (six) hours as needed for flatulence.    [provider]  traZODone (DESYREL) 50 MG tablet Take 50 mg by mouth at bedtime. Can also take 1/2 tablet if needed for insomnia    [provider]    Allergies Vancomycin and Zosyn [piperacillin sod-tazobactam so]  Family History  Problem Relation Age of Onset  . Aneurysm Mother     Social History Social History   Tobacco Use  . Smoking status: Never Smoker  . Smokeless tobacco: Never Used  Substance Use Topics  . Alcohol use: No  . Drug use: No    Review of Systems  Review of Systems  Unable to perform ROS: Patient nonverbal  Neurological: Positive for seizures.      ____________________________________________   PHYSICAL EXAM:  VITAL SIGNS: ED Triage Vitals  Enc Vitals Group     BP      Pulse      Resp      Temp      Temp src      SpO2      Weight      Height      Head Circumference      Peak Flow      Pain Score      Pain Loc      Pain Edu?      Excl.  in [place]?    Vitals:   07/09/20 0430 07/09/20 0442  BP: (!) 85/59 120/74  Pulse: 93 (!) 102  Resp:    Temp:    SpO2: 100% 100%   Physical Exam Vitals and nursing note reviewed.  HENT:     Head: Normocephalic and atraumatic.     Right Ear: External ear normal.     Left Ear: External ear normal.     Nose: Nose normal.     Mouth/Throat:     Mouth: Mucous membranes are moist.  Eyes:     Conjunctiva/sclera: Conjunctivae normal.  Cardiovascular:     Rate and Rhythm: Normal rate and regular rhythm.     Pulses: Normal pulses.  Pulmonary:     Effort: Pulmonary effort is normal. No respiratory distress.  Breath sounds: No wheezing.  Abdominal:     General: There is no distension.     Tenderness: There is no abdominal tenderness.  Musculoskeletal:     Right lower leg: No edema.     Left lower leg: No edema.  Skin:    General: Skin is warm.     Capillary Refill: Capillary refill takes less than 2 seconds.  Neurological:     Mental Status: He is alert. Mental status is at baseline.     Comments: Patient only makes unintelligible sounds when spoken to does not otherwise participate in any interview or exam which is baseline per medics and review of records.  Psychiatric:        Mood and Affect: Mood normal.     Colostomy bag in place on the abdomen.  Surrounding skin is unremarkable.  Lower extremities are held in flexion.  PERRLA.  EOMI.  2+ bilateral radial pulses.  Patient withdraws all extremities to noxious stimuli.  No obvious findings to suggest trauma over the bilateral upper or lower extremities chest abdomen back head or neck. ____________________________________________   LABS (all labs ordered are listed, but only abnormal results are displayed)  Labs Reviewed  BASIC METABOLIC PANEL - Abnormal; Notable for the following components:      Result Value   Glucose, Bld 127 (*)    BUN 29 (*)    All other components within normal limits  CBC WITH DIFFERENTIAL/PLATELET  - Abnormal; Notable for the following components:   RBC 2.94 (*)    Hemoglobin 9.6 (*)    HCT 29.6 (*)    MCV 100.7 (*)    Abs Immature Granulocytes 0.15 (*)    All other components within normal limits  VALPROIC ACID LEVEL - Abnormal; Notable for the following components:   Valproic Acid Lvl 37 (*)    All other components within normal limits   ____________________________________________  EKG  Sinus rhythm with a ventricular rate of 93, normal axis, unremarkable levels, no clear evidence of acute ischemia.  There are some nonspecific ST changes in lead III but these are also noted to be present on prior EKGs and 2015. ____________________________________________  ____________________________________________   PROCEDURES  Procedure(s) performed (including Critical Care):  Procedures   ____________________________________________   INITIAL IMPRESSION / ASSESSMENT AND PLAN / ED COURSE      Patient presents with history concerning for possible breakthrough seizure.  History is very limited as patient is ambulating history and I was unable to obtain any calling number listed in chart x2.  He is afebrile hemodynamically stable on arrival.  He is at his baseline neurological status and there are no historical or exam findings to suggest acute trauma or infectious process.  Extremely low suspicion for toxic ingestion.  BMP shows no significant ultralight or metabolic derangements.  CBC with evidence of anemia with hemoglobin of 9.6 compared to 9.48 months ago.  No other significant derangements.  Low suspicion for acute hemorrhage or acute anemia.  Depakote is subtherapeutic at 37.  Suspect patient likely had a breakthrough seizure in the setting of being subtherapeutic on his Depakote.  He was given IV dose of Depakote emergency room.  No subsequent seizure-like activity observed.  Given stable vitals with otherwise reassuring exam and work-up and likely source for seizure of  subtherapeutic Depakote patient discharged stable condition after Depakote load given.  Unable to reach any caregivers listed in the chart but I was able to reach on-call nurse practitioner at [facility] who  stated she would follow-up with family next couple days and ensure patient was receiving his Depakote as directed and if he was to follow-up with neurology for possible increasing his dose..  I did provide instructions in writing that patient must take his Depakote as directed twice a day.  He should follow-up with his neurologist.  Discharged stable condition.  ____________________________________________   FINAL CLINICAL IMPRESSION(S) / ED DIAGNOSES  Final diagnoses:  Seizure ([place])  Seizure secondary to subtherapeutic anticonvulsant medication (HCC)    Medications  valproate (DEPACON) 250 mg in dextrose 5 % 50 mL IVPB (250 mg Intravenous New Bag/Given 07/09/20 0455)     ED Discharge Orders    None       Note:  This document was prepared using Dragon voice recognition software and may include unintentional dictation errors.   Lucrezia Starch, MD 07/09/20 [phone]

## 2020-07-09 NOTE — ED Notes (Signed)
Patient noted to be having seizure while cleaning from incontinent of urine. Patients body became stiff, jerking, eyes looking upward and to the left. Lips turned blue briefly. Seizure lasted about 1 minute. Oxygen placed on patient, ativan given. MD Bradler at bedside

## 2020-07-09 NOTE — ED Notes (Signed)
This RN provided pt wife with pt update via phone at this time.

## 2020-07-09 NOTE — ED Notes (Signed)
Wife notified of pt d/c and provided information on d/c paper, medication admin instructions, and follow up info at this time

## 2020-07-09 NOTE — ED Notes (Signed)
RN currently waiting for Depacon to be sent to ED from main pharmacy

## 2020-07-09 NOTE — ED Triage Notes (Signed)
Pt arrives via ems from home for seizures, hx of seizures. Wife reported to ems that pt had one around midnight and then had a second per wife around 18.  Pt at baseline per wifes report to ems. Pt nonverbal, and bed bound. Pt has valium at home for use with seizures but failed to administer it this morning. NAD on arrival. md present to assess.  115/69 96 hr 99% RA 97.9 Temp

## 2020-08-20 ENCOUNTER — Inpatient Hospital Stay
Admission: EM | Admit: 2020-08-20 | Discharge: 2020-08-25 | DRG: 374 | Disposition: A | Discharge status: Hospice | Payer: Medicare (Managed Care) | Attending: Internal Medicine | Admitting: Internal Medicine

## 2020-08-20 ENCOUNTER — Other Ambulatory Visit: Payer: Self-pay

## 2020-08-20 ENCOUNTER — Telehealth: Payer: Self-pay | Admitting: Surgery

## 2020-08-20 DIAGNOSIS — Z9049 Acquired absence of other specified parts of digestive tract: Secondary | ICD-10-CM | POA: Diagnosis not present

## 2020-08-20 DIAGNOSIS — Z881 Allergy status to other antibiotic agents status: Secondary | ICD-10-CM | POA: Diagnosis not present

## 2020-08-20 DIAGNOSIS — Z20822 Contact with and (suspected) exposure to covid-19: Secondary | ICD-10-CM | POA: Diagnosis present

## 2020-08-20 DIAGNOSIS — D649 Anemia, unspecified: Secondary | ICD-10-CM | POA: Diagnosis present

## 2020-08-20 DIAGNOSIS — Z79890 Hormone replacement therapy (postmenopausal): Secondary | ICD-10-CM | POA: Diagnosis not present

## 2020-08-20 DIAGNOSIS — Z79899 Other long term (current) drug therapy: Secondary | ICD-10-CM

## 2020-08-20 DIAGNOSIS — Z880 Allergy status to penicillin: Secondary | ICD-10-CM

## 2020-08-20 DIAGNOSIS — Z515 Encounter for palliative care: Secondary | ICD-10-CM | POA: Diagnosis not present

## 2020-08-20 DIAGNOSIS — C155 Malignant neoplasm of lower third of esophagus: Secondary | ICD-10-CM | POA: Diagnosis not present

## 2020-08-20 DIAGNOSIS — I480 Paroxysmal atrial fibrillation: Secondary | ICD-10-CM | POA: Diagnosis present

## 2020-08-20 DIAGNOSIS — K922 Gastrointestinal hemorrhage, unspecified: Secondary | ICD-10-CM | POA: Diagnosis not present

## 2020-08-20 DIAGNOSIS — F0390 Unspecified dementia without behavioral disturbance: Secondary | ICD-10-CM | POA: Diagnosis present

## 2020-08-20 DIAGNOSIS — R1310 Dysphagia, unspecified: Secondary | ICD-10-CM | POA: Diagnosis present

## 2020-08-20 DIAGNOSIS — Z66 Do not resuscitate: Secondary | ICD-10-CM | POA: Diagnosis present

## 2020-08-20 DIAGNOSIS — E039 Hypothyroidism, unspecified: Secondary | ICD-10-CM | POA: Diagnosis present

## 2020-08-20 DIAGNOSIS — G40909 Epilepsy, unspecified, not intractable, without status epilepticus: Secondary | ICD-10-CM | POA: Diagnosis present

## 2020-08-20 DIAGNOSIS — Z933 Colostomy status: Secondary | ICD-10-CM | POA: Diagnosis not present

## 2020-08-20 DIAGNOSIS — C160 Malignant neoplasm of cardia: Principal | ICD-10-CM | POA: Diagnosis present

## 2020-08-20 DIAGNOSIS — G8250 Quadriplegia, unspecified: Secondary | ICD-10-CM | POA: Diagnosis present

## 2020-08-20 DIAGNOSIS — K2289 Other specified disease of esophagus: Secondary | ICD-10-CM

## 2020-08-20 DIAGNOSIS — D62 Acute posthemorrhagic anemia: Secondary | ICD-10-CM | POA: Diagnosis present

## 2020-08-20 DIAGNOSIS — R531 Weakness: Secondary | ICD-10-CM | POA: Diagnosis present

## 2020-08-20 DIAGNOSIS — Z882 Allergy status to sulfonamides status: Secondary | ICD-10-CM

## 2020-08-20 DIAGNOSIS — D509 Iron deficiency anemia, unspecified: Secondary | ICD-10-CM | POA: Diagnosis present

## 2020-08-20 DIAGNOSIS — C159 Malignant neoplasm of esophagus, unspecified: Secondary | ICD-10-CM | POA: Diagnosis present

## 2020-08-20 LAB — BASIC METABOLIC PANEL
Anion gap: 12 (ref 5–15)
BUN: 34 mg/dL — ABNORMAL HIGH (ref 8–23)
CO2: 29 mmol/L (ref 22–32)
Calcium: 9 mg/dL (ref 8.9–10.3)
Chloride: 102 mmol/L (ref 98–111)
Creatinine, Ser: 0.93 mg/dL (ref 0.61–1.24)
GFR, Estimated: >60 mL/min (ref >60)
Glucose, Bld: 114 mg/dL — ABNORMAL HIGH (ref 70–99)
Potassium: 4.4 mmol/L (ref 3.5–5.1)
Sodium: 143 mmol/L (ref 135–145)

## 2020-08-20 LAB — HEMOGLOBIN AND HEMATOCRIT, BLOOD
HCT: 23.7 % — ABNORMAL LOW (ref 39.0–52.0)
Hemoglobin: 7.7 g/dL — ABNORMAL LOW (ref 13.0–17.0)

## 2020-08-20 LAB — FOLATE: Folate: 38 ng/mL (ref >5.9)

## 2020-08-20 LAB — IRON AND TIBC
Iron: 53 ug/dL (ref 45–182)
Saturation Ratios: 14 % — ABNORMAL LOW (ref 17.9–39.5)
TIBC: 372 ug/dL (ref 250–450)
UIBC: 319 ug/dL

## 2020-08-20 LAB — RETICULOCYTES
Immature Retic Fract: 37.5 % — ABNORMAL HIGH (ref 2.3–15.9)
RBC.: 2.16 MIL/uL — ABNORMAL LOW (ref 4.22–5.81)
Retic Count, Absolute: 106.5 10*3/uL (ref 19.0–186.0)
Retic Ct Pct: 4.9 % — ABNORMAL HIGH (ref 0.4–3.1)

## 2020-08-20 LAB — CBC
HCT: 22.4 % — ABNORMAL LOW (ref 39.0–52.0)
Hemoglobin: 6.9 g/dL — ABNORMAL LOW (ref 13.0–17.0)
MCH: 31.8 pg (ref 26.0–34.0)
MCHC: 30.8 g/dL (ref 30.0–36.0)
MCV: 103.2 fL — ABNORMAL HIGH (ref 80.0–100.0)
Platelets: 189 10*3/uL (ref 150–400)
RBC: 2.17 MIL/uL — ABNORMAL LOW (ref 4.22–5.81)
RDW: 13.8 % (ref 11.5–15.5)
WBC: 6.9 10*3/uL (ref 4.0–10.5)
nRBC: 0 % (ref 0.0–0.2)

## 2020-08-20 LAB — PROTIME-INR
INR: 1 (ref 0.8–1.2)
Prothrombin Time: 12.4 seconds (ref 11.4–15.2)

## 2020-08-20 LAB — ABO/RH: ABO/RH(D): A POS

## 2020-08-20 LAB — SARS CORONAVIRUS 2 BY RT PCR (HOSPITAL ORDER, PERFORMED IN ~~LOC~~ HOSPITAL LAB): SARS Coronavirus 2: NEGATIVE

## 2020-08-20 LAB — PATHOLOGIST SMEAR REVIEW

## 2020-08-20 LAB — PREPARE RBC (CROSSMATCH)

## 2020-08-20 LAB — MAGNESIUM: Magnesium: 2.1 mg/dL (ref 1.7–2.4)

## 2020-08-20 LAB — FERRITIN: Ferritin: 239 ng/mL (ref 24–336)

## 2020-08-20 MED ORDER — PANTOPRAZOLE SODIUM 40 MG IV SOLR
40.0000 mg | Freq: Two times a day (BID) | INTRAVENOUS | Status: DC
  Administered 2020-08-20 – 2020-08-23 (×6): 40 mg via INTRAVENOUS
  Filled 2020-08-20 (×6): qty 40

## 2020-08-20 MED ORDER — PANTOPRAZOLE SODIUM 40 MG IV SOLR
40.0000 mg | Freq: Once | INTRAVENOUS | Status: AC
  Administered 2020-08-20: 40 mg via INTRAVENOUS
  Filled 2020-08-20: qty 40

## 2020-08-20 MED ORDER — SODIUM CHLORIDE 0.9 % IV SOLN
10.0000 mL/h | Freq: Once | INTRAVENOUS | Status: AC
  Administered 2020-08-20: 10 mL/h via INTRAVENOUS

## 2020-08-20 MED ORDER — SODIUM CHLORIDE 0.9% IV SOLUTION
Freq: Once | INTRAVENOUS | Status: DC
  Filled 2020-08-20: qty 250

## 2020-08-20 MED ORDER — ONDANSETRON HCL 4 MG/2ML IJ SOLN: 4.0000 mg | Freq: Four times a day (QID) | INTRAMUSCULAR | Status: DC | PRN

## 2020-08-20 NOTE — Progress Notes (Signed)
Update: I have consulted Dr. Allen Norris of GI for assistance with evaluation and management of suspected presenting acute upper gastrointestinal bleed.     Babs Bertin, DO Hospitalist

## 2020-08-20 NOTE — ED Provider Notes (Signed)
[facility] Emergency Department Provider Note   ____________________________________________   Event Date/Time   First MD Initiated Contact with Patient 08/20/20 1248     (approximate)  I have reviewed the triage vital signs and the nursing notes.   HISTORY  Chief Complaint Weakness    HPI Jason Harmon is a 80 y.o. male with past medical history of dementia, atrial fibrillation not on anticoagulation, seizures, hypothyroidism, and bowel perforation status post colostomy who presents to the ED for weakness.  Wife states that the patient has been less active than usual over the past few days and sleeping more than usual.  He was evaluated by his PCP at home earlier today as part of the [facility] program, found to have a hemoglobin of 6.4 and guaiac positive stool in his colostomy bag.  He was referred to the ED for blood transfusion and admission.  Patient's wife at bedside provides majority of history, states other than the weakness he has been acting like his usual self.  He has not had any fevers, nausea, vomiting, or complained of any pain.  Patient unable to provide any history at this time.        Past Medical History:  Diagnosis Date  . A-fib ([place])   . Acute renal failure ([place])   . Ischemic bowel disease ([place])   . Renal disorder   . Sepsis [facility])     Patient Active Problem List   Diagnosis Date Noted  . Drug-induced skin rash 11/04/2019  . FTT (failure to thrive) in adult 11/04/2019  . Pneumoperitoneum 10/30/2019  . Hypothyroidism 10/29/2019  . Seizure ([place]) 10/29/2019  . Lactic acidosis 10/29/2019  . Hypothermia 10/29/2019  . Pleural effusion 09/11/2013  . Palliative care encounter 09/03/2013  . 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 Hemoptysis 08/26/2013  . Bowel perforation ([place]) 08/18/2013  . Acute and chronic respiratory failure 08/18/2013  . Septic shock ([place]) 08/18/2013  . Pulmonary edema  08/12/2013  . Pneumonia 08/12/2013  . Acute respiratory failure ([place]) 08/12/2013  . 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 Pneumonia, organism unspecified(486) 08/12/2013  . Acute on chronic respiratory failure ([place]) 08/09/2013  . Acute respiratory failure following trauma and surgery ([place]) 08/07/2013  . Atrial fibrillation ([place]) 08/07/2013  . 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 Hyperglycemia 08/07/2013  . Acute renal failure ([place]) 08/07/2013  . Acute blood loss anemia 08/07/2013  . Acute vascular insufficiency of intestine ([place]) 08/07/2013    Past Surgical History:  Procedure Laterality Date  . ABDOMINAL SURGERY    . COLOSTOMY N/A 08/20/2013   Procedure: COLOSTOMY;  Surgeon: Rolm Bookbinder, MD;  Location: [place];  Service: General;  Laterality: N/A;  . GASTROSTOMY N/A 08/20/2013   Procedure: G-TUBE PLACEMENT;  Surgeon: Rolm Bookbinder, MD;  Location: [place];  Service: General;  Laterality: N/A;  . LAPAROTOMY N/A 08/18/2013   Procedure: EXPLORATORY LAPAROTOMY;  Surgeon: Joyice Faster. Cornett, MD;  Location: [place];  Service: General;  Laterality: N/A;  . LAPAROTOMY N/A 08/20/2013   Procedure: EXPLORATORY LAPAROTOMY WITH  SMALL BOWEL RESECTION;  Surgeon: Rolm Bookbinder, MD;  Location: [place];  Service: General;  Laterality: N/A;    Prior to Admission medications   Medication Sig Start Date End Date Taking? Authorizing Provider  acetaminophen (TYLENOL) 650 MG CR tablet Take 650 mg by mouth every 8 (eight) hours as needed for pain.    [provider]  bisacodyl (DULCOLAX) 10 MG suppository Place 10 mg rectally  2 (two) times a week.    [provider]  divalproex (DEPAKOTE) 250 MG DR tablet Take 1 tablet (250 mg total) by mouth every 12 (twelve) hours. 11/04/19   Dhungel, Nishant, MD  DULoxetine (CYMBALTA) 20 MG capsule Take 20 mg by mouth 2 (two) times daily.    [provider]  levothyroxine (SYNTHROID, LEVOTHROID) 100 MCG tablet Take 100 mcg by  mouth daily before breakfast. [place] Reek Thurs    [provider]  Levothyroxine Sodium 88 MCG CAPS Take by mouth daily before breakfast. Mon Wed Fri and Sat    [provider]  Lidocaine HCl (ASPERCREME LIDOCAINE) 4 % CREA Apply topically 3 (three) times daily.     [provider]  liver oil-zinc oxide (DESITIN) 40 % ointment Apply 1 application topically See admin instructions. Apply to irritated areas of gluteal and peri-anal skin with each diaper change until healed    [provider]  Metoprolol Succinate 25 MG CS24 Take 25 mg by mouth daily.     [provider]  Nutritional Supplements (ENSURE ENLIVE PO) Take 237 mLs by mouth in the morning and at bedtime.     [provider]  polyethylene glycol (MIRALAX / GLYCOLAX) packet Take 17 g by mouth 2 (two) times daily.    [provider]  senna (SENOKOT) 8.6 MG TABS tablet Take 2 tablets by mouth at bedtime.    [provider]  simethicone (MYLICON) 80 MG chewable tablet Chew 80 mg by mouth every 6 (six) hours as needed for flatulence.    [provider]  traZODone (DESYREL) 50 MG tablet Take 50 mg by mouth at bedtime. Can also take 1/2 tablet if needed for insomnia    [provider]    Allergies Vancomycin and Zosyn [piperacillin sod-tazobactam so]  Family History  Problem Relation Age of Onset  . Aneurysm Mother     Social History Social History   Tobacco Use  . Smoking status: Never Smoker  . Smokeless tobacco: Never Used  Substance Use Topics  . Alcohol use: No  . Drug use: No    Review of Systems Unable to obtain secondary to dementia  ____________________________________________   PHYSICAL EXAM:  VITAL SIGNS: ED Triage Vitals [08/20/20 1207]  Enc Vitals Group     BP (!) 96/51     Pulse Rate 97     Resp 17     Temp 98.2 F (36.8 C)     Temp Source Oral     SpO2      Weight      Height      Head Circumference      Peak Flow       Pain Score      Pain Loc      Pain Edu?      Excl. in [place]?     Constitutional: Awake and alert, garbled speech. Eyes: Conjunctivae are normal. Head: Atraumatic. Nose: No congestion/rhinnorhea. Mouth/Throat: Mucous membranes are moist. Neck: Normal ROM Cardiovascular: Normal rate, regular rhythm. Grossly normal heart sounds. Respiratory: Normal respiratory effort.  No retractions. Lungs CTAB. Gastrointestinal: Colostomy bag with maroon-colored stool, abdomen soft and nontender.  Partially distended abdomen, chronic per wife. Genitourinary: deferred Musculoskeletal: No lower extremity tenderness nor edema. Neurologic: Garbled and nonsensical speech.  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 Skin:  Skin is warm, dry and intact. No rash noted. Psychiatric: Unable to assess.  ____________________________________________   LABS (all labs ordered are listed, but only abnormal results are displayed)  Labs Reviewed  BASIC METABOLIC PANEL - Abnormal; Notable for the following components:      Result Value   Glucose, Bld 114 (*)    BUN 34 (*)    All other components within normal limits  CBC - Abnormal; Notable for the following components:   RBC 2.17 (*)    Hemoglobin 6.9 (*)    HCT 22.4 (*)    MCV 103.2 (*)    All other components within normal limits  SARS CORONAVIRUS 2 BY RT PCR (HOSPITAL ORDER, [facility])  URINALYSIS, COMPLETE (UACMP) WITH MICROSCOPIC  CBG MONITORING, ED  TYPE AND SCREEN  PREPARE RBC (CROSSMATCH)   ____________________________________________  EKG  ED ECG REPORT I, Blake Divine, the attending physician, personally viewed and interpreted this ECG.   Date: 08/20/2020  EKG Time: 12:07  Rate: 101  Rhythm: sinus tachycardia  Axis: Normal  Intervals:none  ST&T Change: None   PROCEDURES  Procedure(s) performed (including Critical Care):  .Critical Care Performed by: Blake Divine, MD Authorized by: Blake Divine, MD    Critical care provider statement:    Critical care time (minutes):  45   Critical care time was exclusive of:  Separately billable procedures and treating other patients and teaching time   Critical care was necessary to treat or prevent imminent or life-threatening deterioration of the following conditions:  Circulatory failure   Critical care was time spent personally by me on the following activities:  Discussions with consultants, evaluation of patient's response to treatment, examination of patient, ordering and performing treatments and interventions, ordering and review of laboratory studies, ordering and review of radiographic studies, pulse oximetry, re-evaluation of patient's condition, obtaining history from patient or surrogate and review of old charts   I assumed direction of critical care for this patient from another provider in my specialty: no       ____________________________________________   INITIAL IMPRESSION / ASSESSMENT AND PLAN / ED COURSE       80 year old male with past medical history of dementia, atrial fibrillation not on anticoagulation, seizures, hypothyroidism, and bowel perforation status post colostomy who presents to the ED for generalized weakness and anemia noted on outpatient labs.  He does appear to have maroon-colored stool collecting him his colostomy bag, reportedly guaiac positive with PCPs evaluation.  He is not anticoagulated, but hemoglobin resulted at 6.9 and we will transfuse 1 unit PRBCs.  He is hemodynamically stable at this time and bleeding has likely been going on for multiple days.  Labs are otherwise unremarkable and patient is at his baseline mental status.  We will treat with IV Protonix and discussed with hospitalist for admission.      ____________________________________________   FINAL CLINICAL IMPRESSION(S) / ED DIAGNOSES  Final diagnoses:  Gastrointestinal hemorrhage, unspecified gastrointestinal hemorrhage type   Generalized weakness     ED Discharge Orders    None       Note:  This document was prepared using Dragon voice recognition software and may include unintentional dictation errors.   Blake Divine, MD 08/20/20 1320

## 2020-08-20 NOTE — Telephone Encounter (Signed)
Lytle Butte is calling from [place] and is asking if someone could give her a call regarding this patient and could be reached  Venida Jarvis can be reached at [phone]. Please call and advise.

## 2020-08-20 NOTE — Telephone Encounter (Signed)
Sherri Meow-PCP just called to let us know this patient is currently in the ED for gI bleed. Since he has a complex health just wanted to let us know if we are scheduled to see him in the future.

## 2020-08-20 NOTE — Progress Notes (Signed)
Pt arrived to the unit.  Pt very agitated at this time. Able to transfer from stretcher to bed  Vitals obtained. Tele monitor placed Wife at bedside and educated on plan of care.  Verbalizes an understanding.  Denies any wants or needs at this time.  Call bell within reach. Will continue to monitor.

## 2020-08-20 NOTE — H&P (Signed)
History and Physical    PLEASE NOTE THAT DRAGON DICTATION SOFTWARE WAS USED IN THE CONSTRUCTION OF THIS NOTE.   Jason Harmon JJO:[id number] DOB: [birth year] DOA: 08/20/2020  PCP: [name], MD Patient coming from: home   I have personally briefly reviewed patient's old medical records in [place]  Chief Complaint: generalized weakness  HPI: Jason Harmon is a 80 y.o. 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   In the context of the patient's reported baseline dementia, the following history is provided by the patient's wife, who is present bedside, in addition to my discussions with the emergency department physician, and via chart review.  The patient's wife reports exhibited 2 to 3 days of generalized weakness, in the absence of any associated acute weakness.  She reports that the patient has not been as active over the last 2 to 3 days, which she relates to be specifically associated with generalized weakness.  Otherwise, she reports that the patient has had no acute complaints recently.  No objective fever at home.  No associated nausea, vomiting, abdominal pain, or rash.  Wife notes no recent change in the volume of stool output through the patient's colostomy bag, but has noticed development of a dark coloration associated with the stool, which she reports is new over the last few days.  Denies any associated bright red blood associated with the fecal material present in the colostomy bag over that time.  No  recent trauma.    Patient is not on any blood thinners at home, including no aspirin.  Wife confirms for medical history includes history of paroxysmal atrial fibrillation for which the patient is not chronically anticoagulated.  No recent NSAID use.  No history of alcohol consumption.  Reports that the patient has no known history of underlying liver disease.   The patient has a history of ischemic bowel disease complicated by bowel perforation/necrosis resulting in partial colectomy with diverting colostomy in February 2015.  Per chart review, encountered any endoscopic evaluation patient's partial colectomy with diverting colostomy in 2015.   Medical history is also notable for chronic anemia with baseline hemoglobin 9-10.   In the setting of the patient's recent generalized weakness, he was evaluated by his PCP earlier this morning, at which time evaluation included a positive guaiac positive finding associated with fecal matter present in the ED colostomy bag at that time.  Resultantly, PCP recommended that the patient presents to the emergency department for further evaluation of suspected acute GI bleed.  No recent known COVID-19 exposures.     ED Course:  Vital signs in the ED were notable for the following: Temperature max 98.2; heart rate 97-1 13; blood pressure prior to initiation of PRBC transfusion has been noted to be in the range of 99/51 - 115/63; respiratory rate 17-19; oxygen saturation 100% on room air.  Labs were notable for the following: BMP was notable for the following: Sodium 143, bicarbonate 29, BUN 34 relative to most recent prior BUN level of 29 on 12 23,021, creatinine 0.93, relative to 0.87 on 07/09/2020.  CBC was notable for the following: Blood cell count of 7000, hemoglobin  6.9 with MCV 103, MCHC 30.8, and RDW 13.8.  This is relative to most recent prior hemoglobin value of 9.6 when checked on 07/09/2020.  Additionally, today CBC was notable for platelet count of  189.  Urinalysis was ordered, with result currently pending.  Screening nasopharyngeal COVID-19 PCR was checked in the ED today, with result currently pending.  EKG shows sinus tachycardia with heart rate 101, normal intervals, and no evidence of T wave or ST changes, including no evidence of ST elevation.  While in the ED, the following were administered: Protonix 40 mg IV x1.  Additionally patient was typed and screened for blood, and transfusion of 1 unit PRBC was initiated.  Separately, the patient is being admitted to PCU for further evaluation and management of presenting suspected acute upper GI bleed with acute on chronic anemia.     Review of Systems: As per HPI otherwise 10 point review of systems negative.   Past Medical History:  Diagnosis Date  . A-fib ([place])   . Acute renal failure ([place])   . Ischemic bowel disease ([place])   . Renal disorder   . Sepsis [facility])     Past Surgical History:  Procedure Laterality Date  . ABDOMINAL SURGERY    . COLOSTOMY N/A 08/20/2013   Procedure: COLOSTOMY;  Surgeon: Rolm Bookbinder, MD;  Location: [place];  Service: General;  Laterality: N/A;  . GASTROSTOMY N/A 08/20/2013   Procedure: G-TUBE PLACEMENT;  Surgeon: Rolm Bookbinder, MD;  Location: [place];  Service: General;  Laterality: N/A;  . LAPAROTOMY N/A 08/18/2013   Procedure: EXPLORATORY LAPAROTOMY;  Surgeon: Joyice Faster. Cornett, MD;  Location: [place];  Service: General;  Laterality: N/A;  . LAPAROTOMY N/A 08/20/2013   Procedure: EXPLORATORY LAPAROTOMY WITH  SMALL BOWEL RESECTION;  Surgeon: Rolm Bookbinder, MD;  Location: [place];  Service: General;  Laterality: N/A;    Social History:  reports that he has never smoked. He has never used smokeless tobacco. He reports that he does not drink alcohol and does not use drugs.   Allergies  Allergen Reactions  . Vancomycin Rash  . Zosyn [Piperacillin Sod-Tazobactam So] Rash    Family History  Problem Relation Age of Onset  . Aneurysm Mother       Prior to Admission medications   Medication Sig Start Date End Date Taking? Authorizing Provider  acetaminophen (TYLENOL) 650 MG CR tablet Take 650 mg by mouth every 8 (eight) hours as needed for pain.    [provider]  bisacodyl (DULCOLAX) 10 MG suppository Place 10 mg rectally 2 (two) times a week.    [provider]  divalproex (DEPAKOTE) 250 MG DR tablet Take 1 tablet (250 mg total) by mouth every 12 (twelve) hours. 11/04/19   Dhungel, Nishant, MD  DULoxetine (CYMBALTA) 20 MG capsule Take 20 mg by mouth 2 (two) times daily.    [provider]  levothyroxine (SYNTHROID, LEVOTHROID) 100 MCG tablet Take 100 mcg by mouth daily before breakfast. [place] Thurs    [provider]  Levothyroxine Sodium 88 MCG CAPS Take by mouth daily before breakfast. Mon Wed Fri and Sat    [provider]  Lidocaine HCl (ASPERCREME LIDOCAINE) 4 % CREA Apply topically 3 (three) times daily.     [provider]  liver oil-zinc oxide (DESITIN) 40 % ointment Apply 1 application topically See admin instructions. Apply to irritated areas of gluteal and peri-anal skin with each diaper change until healed    [provider]  Metoprolol Succinate 25 MG CS24 Take 25 mg by mouth daily.     [provider]  Nutritional Supplements (ENSURE ENLIVE PO) Take 237 mLs by mouth in the morning and at bedtime.     [provider]  polyethylene glycol (MIRALAX / GLYCOLAX) packet Take 17 g by mouth 2 (two) times daily.    [provider]  senna (SENOKOT) 8.6 MG TABS tablet Take 2 tablets by mouth at bedtime.    [provider]  simethicone (MYLICON) 80 MG chewable tablet Chew 80 mg by mouth every 6 (six) hours as needed for flatulence.    [provider]  traZODone (DESYREL) 50 MG tablet Take 50 mg by mouth at bedtime. Can also take 1/2 tablet if needed for insomnia    [provider]     Objective    Physical  Exam: Vitals:   08/20/20 1207 08/20/20 1300  BP: (!) 96/51 115/63  Pulse: 97 (!) 113  Resp: 17 19  Temp: 98.2 F (36.8 C)   TempSrc: Oral   SpO2:  100%    General: appears to be stated age; alert Skin: warm, dry, no rash Head:  AT/[place] Mouth:  Oral mucosa membranes appear moist, normal dentition Neck: supple; trachea midline Heart:  RRR; did not appreciate any M/R/G Lungs: CTAB, did not appreciate any wheezes, rales, or rhonchi Abdomen: + BS; soft, ND, NT; colostomy bag presented with small amt of maroon colored stool noted.  Vascular: 2+ pedal pulses b/l; 2+ radial pulses b/l Extremities: no peripheral edema, no muscle wasting Neuro: strength and sensation intact in upper and lower extremities b/l    Labs on Admission: I have personally reviewed following labs and imaging studies  CBC: Recent Labs  Lab 08/20/20 1208  WBC 6.9  HGB 6.9*  HCT 22.4*  MCV 103.2*  PLT 99991111   Basic Metabolic Panel: Recent Labs  Lab 08/20/20 1208  NA 143  K 4.4  CL 102  CO2 29  GLUCOSE 114*  BUN 34*  CREATININE 0.93  CALCIUM 9.0   GFR: CrCl cannot be calculated (Unknown ideal weight.). Liver Function Tests: No results for input(s): AST, ALT, ALKPHOS, BILITOT, PROT, ALBUMIN in the last 168 hours. No results for input(s): LIPASE, AMYLASE in the last 168 hours. No results for input(s): AMMONIA in the last 168 hours. Coagulation Profile: No results for input(s): INR, PROTIME in the last 168 hours. Cardiac Enzymes: No results for input(s): CKTOTAL, CKMB, CKMBINDEX, TROPONINI in the last 168 hours. BNP (last 3 results) No results for input(s): PROBNP in the last 8760 hours. HbA1C: No results for input(s): HGBA1C in the last 72 hours. CBG: No results for input(s): GLUCAP in the last 168 hours. Lipid Profile: No results for input(s): CHOL, HDL, LDLCALC, TRIG, CHOLHDL, LDLDIRECT in the last 72 hours. Thyroid Function Tests: No results for input(s): TSH, T4TOTAL, FREET4, T3FREE,  THYROIDAB in the last 72 hours. Anemia Panel: No results for input(s): VITAMINB12, FOLATE, FERRITIN, TIBC, IRON, RETICCTPCT in the last 72 hours. Urine analysis:    Component Value Date/Time   COLORURINE YELLOW (A) 10/29/2019 1225   APPEARANCEUR CLOUDY (A) 10/29/2019 1225   APPEARANCEUR Clear 03/15/2014 1331   LABSPEC 1.025 10/29/2019 1225   LABSPEC 1.017 03/15/2014 1331   PHURINE 7.0 10/29/2019 1225   GLUCOSEU NEGATIVE 10/29/2019 1225   GLUCOSEU 150 mg/dL 03/15/2014 1331   HGBUR NEGATIVE 10/29/2019 1225   BILIRUBINUR NEGATIVE 10/29/2019 1225   BILIRUBINUR Negative 03/15/2014 1331   KETONESUR NEGATIVE 10/29/2019 1225  PROTEINUR 30 (A) 10/29/2019 1225   UROBILINOGEN 0.2 10/18/2013 1356   NITRITE NEGATIVE 10/29/2019 1225   LEUKOCYTESUR NEGATIVE 10/29/2019 1225   LEUKOCYTESUR Negative 03/15/2014 1331    Radiological Exams on Admission: No results found.   EKG: Independently reviewed, with result as described above.    Assessment/Plan   Jaquez Langenbach is a 80 y.o. 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 Emergency Department complaining of generalized weakness.     Principal Problem:   Acute upper GI bleed Active Problems:   AF (paroxysmal atrial fibrillation) (HCC)   Hypothyroidism   Acute on chronic anemia   Generalized weakness   Seizure disorder (HCC)      #) Acute Upper GI Bleed: diagnosis on the basis of 3 to 4 days dark-colored stool in patient's colostomy bag interval increase in BUN, as quantified above, and associated with presenting labs  reflecting acute on chronic anemia, with presenting hemoglobin noted to be 6.9 relative to baseline range of 9-10.  Urine dark coloration stool as well as elevation of BUN, suspecthe patient's GI bleed may be upper in location; not on any blood thinners as an outpatient, including no aspirin. Denies NSAID use. No known history of liver disease, and denies any history of alcohol abuse.  Chart review reveals no evidence endoscopic evaluation following patient's partial colectomy with diverting colostomy in 2015. Differential includes gastritis versus PUD versus AVM vs esophagitis. In the absence of known liver disease, initiation of SBP prophylaxis does not appear to be warranted. Presentation and history are less suggestive of variceal bleed, and therefore there does not appear to be an indication for octreotide.    At this time, the patient appears hemodynamically stable, with normotensive blood pressures prior to initiation of transfusion of 1 unit PRBC in the ED. Patient noted to be mildly tachycardic in the ED with HR in the low 100's.  Aside from the dark-colored stool in colostomy as well as presenting generalized weakness, otherwise appears asymptomatic, without any associated nausea, vomiting, or abdominal pain.  Received Protonix 40 mg IV x1 in the ED and will continue IV Protonix given suspected upper gastrointestinal source. Will consult GI for further recs regarding evaluation and management, including assessement for endoscopic evaluation. I have ordered a repeat H&H to be checked following completion of transfusion of his first unit PRBC.  We will correlate interval trend in hemoglobin at that time patient's outside to determine need for additional PBC transfusion.      Plan: NPO. Refraining from pharmacologic DVT prophylaxis. Monitor on telemetry. Monitor continuous pulse-ox. Maintain at least 2 large bore IV's.   Add on INR and recheck in the AM. Q4H H&H's have been ordered through 9 AM tomorrow  morning (08/21/20). Will closely monitor these ensuing Hgb levels and correlate these data points with the patient's overall clinical picture including vital signs to determine need for subsequent transfusion.  Protonix 40 mg IV twice daily. Will consult GI, as above.  Additional evaluation management presenting acute on chronic anemia, as further described low.  CMP in the morning.      #) Acute blood loss anemia on chronic anemia: in the setting of suspected acute upper GI bleed,  as above, presenting Hgb noted to be 6.9  which is relative to baseline hemoglobin range of 9-10, with most recent prior hemoglobin data point noted to be 9.6 on 07/09/2020.  At this time, the patient precipitately stable and asymptomatic, as further described above.  He was typed and screened in the ED today, with transfusion of 1 unit PRBC initiated at that time.  Of note, today's hemoglobin of 6.9 associated with macrocytic, normochromic findings, with no elevation in RDW.  Will expand laboratory evaluation of patient's anemia, as further detailed below.   Plan: work-up and management for presenting suspected acute upper GI bleed, as above, including close monitoring of Q4H H&H's, with clinical evaluation for determination of need for blood transfusion, as further described above. Monitor on telemetry. Monitor continuous pulse-ox. NPO. Refraining from pharmacologic DVT prophylaxis.  Add on INR, with repeat ordered for the morning.  PRBC transfusion, as above.  Add on the following to pretransfusion labs collected in the ED today: Total iron, TIBC, ferritin, MMA, folic acid, reticulocyte count, peripheral smear.  Gastroenterology consult, as above.      #) Generalized weakness: wife conveys that patient has exhibited 2-3 duration of generalized weakness, in the absence of any evidence of acute focal neurologic deficits, including no evidence of acute focal weakness. Suspect that this is on the basis of concomitant presenting  acute on chronic anemia in the setting of suspected acute upper gastrointestinal bleed, as above.  No overt evidence of underlying infectious process at this time, although screening COVID-19 PCR result is currently pending.  Additionally, urinalysis has been ordered, with result currently pending.    Plan: work-up and management of presenting suspected acute upper gastrointestinal bleed complicated by acute on chronic anemia, as described above.  Generalized weakness persist following adequate transfusion and evaluation by GI, could consider physical therapy consultation at that time.  Check TSH.  CMP in the morning.  Repeat CBC in the morning as well.  Follow-up result urinalysis as well as screening nasopharyngeal COVID-19 PCR.     #) Paroxysmal atrial fibrillation: Documented history of such. In the setting of a CHA2DS2-VASc score of 2, there is an indication for the patient to be on chronic anticoagulation for thromboembolic prophylaxis.  The patient swelling confirms a significant is chronically anticoagulated, and is on no blood thinning agents on outpatient basis, including aspirin.  Rationale behind not chronically anticoagulating this patient is currently unclear to me, but will attempt additional chart review to gain further clarification.  Home AV nodal blocking regimen consists of metoprolol succinate.  Appears to be in sinus rhythm at the time of presentation.   Plan: monitor strict I's & O's and daily weights. Repeat BMP in the morning. Check serum magnesium level.  In the setting of presenting suspected acute upper gastrointestinal bleed, will hold beta-blocker for now.  Monitor on telemetry.  Additional chart review for insight into the rationale behind lack of chronic anticoagulation.       #) Acquired hypothyroidism: On Synthroid as an outpatient.  Plan: In the setting of current n.p.o. status, will hold home Synthroid for now.       #) Seizures: The patient has a  documented history of seizure disorder for which she is on Depakote at home.  No evidence of recent seizures.   Plan: In the setting of current n.p.o. status, will hold home Depakote for now.  We will plan to resume following further evaluation suspected presenting acute gastrointestinal bleed, as above.  Add on valproic  acid level.     DVT prophylaxis: SCDs  Code Status: DNR Family Communication: patient's wife Disposition Plan: Per Rounding Team Admission status: inpatient; pcu   Of note, this patient was added by me to the following Admit List/Treatment Team:  armcadmits     PLEASE NOTE THAT DRAGON DICTATION SOFTWARE WAS USED IN THE CONSTRUCTION OF THIS NOTE.   [place] [facility] Pager [phone] From 12PM- 12AM  Otherwise, please contact night-coverage  [email or web address] Password [email or web address]  08/20/2020, 2:04 PM

## 2020-08-20 NOTE — ED Triage Notes (Signed)
[facility] comes from [facility] with c/o weakness. Per [facility] pt has GI bleed nonsymptomatic. Pt has had drop in Hgb from 9 to 6. Pt appears little pale.  Pt is Quadriplegic from hx of sepsis. Pt is confused and not sure if baseline.

## 2020-08-20 NOTE — Consult Note (Addendum)
Lucilla Lame, MD [facility]  [address], [place], [place] [address] Phone: [phone] Fax : [phone]  Consultation  Referring Provider:     Dr. Velia Meyer Primary Care Physician:  [name], MD Primary Gastroenterologist:  Althia Forts         Reason for Consultation:     GI bleed  Date of Admission:  08/20/2020 Date of Consultation:  08/20/2020         HPI:   Jason Harmon is a 80 y.o. male Who suffers with dementia and is unable to give any history but his wife is in the room and gives most of the history.  The patient has a history of atrial fibrillation.  He also has a history of seizures and bowel perforation with a colostomy.  The patient's wife states that he is still moving bowels through his rectum in addition to his colostomy.  The report has been at the patient has been having dark stools with weakness.  The patient was found to have a low hemoglobin of 6.4 and was transferred to the ER for blood transfusion and admission.  There is no report of any abdominal pain nausea vomiting fevers or chills.  There is a history in his chart of a exploratory laparotomy with small bowel resection. The patient's ferritin was normal as well as his iron although his iron saturation was low at 16. There is no report of the patient taking any anti-inflammatory medications.  He also is not reported to have any signs of GI bleeding the past per  Past Medical History:  Diagnosis Date  . A-fib ([place])   . Acute renal failure ([place])   . Ischemic bowel disease ([place])   . Renal disorder   . Sepsis [facility])     Past Surgical History:  Procedure Laterality Date  . ABDOMINAL SURGERY    . COLOSTOMY N/A 08/20/2013   Procedure: COLOSTOMY;  Surgeon: Rolm Bookbinder, MD;  Location: [place];  Service: General;  Laterality: N/A;  . GASTROSTOMY N/A 08/20/2013   Procedure: G-TUBE PLACEMENT;  Surgeon: Rolm Bookbinder, MD;  Location: [place];  Service: General;  Laterality: N/A;  . LAPAROTOMY N/A 08/18/2013    Procedure: EXPLORATORY LAPAROTOMY;  Surgeon: Joyice Faster. Cornett, MD;  Location: [place];  Service: General;  Laterality: N/A;  . LAPAROTOMY N/A 08/20/2013   Procedure: EXPLORATORY LAPAROTOMY WITH  SMALL BOWEL RESECTION;  Surgeon: Rolm Bookbinder, MD;  Location: [place];  Service: General;  Laterality: N/A;    Prior to Admission medications   Medication Sig Start Date End Date Taking? Authorizing Provider  acetaminophen (TYLENOL) 650 MG CR tablet Take 1,300 mg by mouth in the morning and at bedtime.   Yes [provider]  diazepam (DIASTAT ACUDIAL) 10 MG GEL Place 10 mg rectally once.   Yes [provider]  divalproex (DEPAKOTE) 250 MG DR tablet Take 1 tablet (250 mg total) by mouth every 12 (twelve) hours. 11/04/19  Yes Dhungel, Nishant, MD  DULoxetine (CYMBALTA) 30 MG capsule Take 30 mg by mouth 2 (two) times daily.   Yes [provider]  guaiFENesin (ROBITUSSIN) 100 MG/5ML liquid Take 200 mg by mouth 2 (two) times daily as needed for cough (to loosen phlegm).   Yes [provider]  levothyroxine (SYNTHROID, LEVOTHROID) 100 MCG tablet Take 100 mcg by mouth daily before breakfast. Sun Tue Thurs   Yes [provider]  Levothyroxine Sodium 88 MCG CAPS Take by mouth daily before breakfast. Mon Wed Fri and  Sat   Yes [provider]  Lidocaine HCl (ASPERCREME LIDOCAINE) 4 % CREA Apply topically 3 (three) times daily.    Yes [provider]  liver oil-zinc oxide (DESITIN) 40 % ointment Apply 1 application topically See admin instructions. Apply to irritated areas of gluteal and peri-anal skin with each diaper change until healed   Yes [provider]  Metoprolol Succinate 25 MG CS24 Take 25 mg by mouth daily.    Yes [provider]  mineral oil-hydrophilic petrolatum (AQUAPHOR) ointment Apply 1 application topically 2 (two) times daily as needed for dry skin.   Yes [provider]  senna (SENOKOT) 8.6 MG TABS tablet Take  2 tablets by mouth at bedtime.   Yes [provider]  simethicone (MYLICON) 80 MG chewable tablet Chew 80 mg by mouth every 6 (six) hours as needed for flatulence.   Yes [provider]  traZODone (DESYREL) 50 MG tablet    Yes [provider]  bisacodyl (DULCOLAX) 10 MG suppository Place 10 mg rectally 2 (two) times a week.    [provider]  Nutritional Supplements (ENSURE ENLIVE PO) Take 237 mLs by mouth in the morning and at bedtime.     [provider]    Family History  Problem Relation Age of Onset  . Aneurysm Mother      Social History   Tobacco Use  . Smoking status: Never Smoker  . Smokeless tobacco: Never Used  Substance Use Topics  . Alcohol use: No  . Drug use: No    Allergies as of 08/20/2020 - Review Complete 08/20/2020  Allergen Reaction Noted  . Penicillins  08/20/2020  . Sulfa antibiotics  08/20/2020  . Vancomycin Rash 11/03/2019  . Zosyn [piperacillin sod-tazobactam so] Rash 11/03/2019    Review of Systems:    All systems reviewed and negative except where noted in HPI.   Physical Exam:  Vital signs in last 24 hours: Temp:  [98.2 F (36.8 C)-98.8 F (37.1 C)] 98.3 F (36.8 C) (02/03 1630) Pulse Rate:  [76-113] 77 (02/03 1630) Resp:  [15-22] 17 (02/03 1630) BP: (91-115)/(48-76) 99/56 (02/03 1630) SpO2:  [95 %-100 %] 98 % (02/03 1630)   General:   Pleasant, Confused Head:  Normocephalic and atraumatic. Eyes:   No icterus.   Conjunctiva pink. PERRLA. Ears:  Normal auditory acuity. Neck:  Supple; no masses or thyroidomegaly Lungs: Respirations even and unlabored. Lungs clear to auscultation bilaterally.   No wheezes, crackles, or rhonchi.  Heart:  Regular rate and rhythm;  Without murmur, clicks, rubs or gallops Abdomen:  Soft, nondistended, nontender. Normal bowel sounds. No appreciable masses or hepatomegaly.  No rebound or guarding.  Rectal:  Not performed. Msk:  Symmetrical without gross deformities.     Extremities:  Without edema, cyanosis or clubbing. Neurologic:  Alert and not oriented x3; . Skin:  Intact without significant lesions or rashes. Cervical Nodes:  No significant cervical adenopathy. Psych:  Alert  LAB RESULTS: Recent Labs    08/20/20 1208  WBC 6.9  HGB 6.9*  HCT 22.4*  PLT 189   BMET Recent Labs    08/20/20 1208  NA 143  K 4.4  CL 102  CO2 29  GLUCOSE 114*  BUN 34*  CREATININE 0.93  CALCIUM 9.0   LFT No results for input(s): PROT, ALBUMIN, AST, ALT, ALKPHOS, BILITOT, BILIDIR, IBILI in the last 72 hours. PT/INR Recent Labs    08/20/20 1208  LABPROT 12.4  INR 1.0  STUDIES: No results found.    Impression / Plan:   Assessment: Principal Problem:   Acute upper GI bleed Active Problems:   AF (paroxysmal atrial fibrillation) (HCC)   Hypothyroidism   Acute on chronic anemia   Generalized weakness   Seizure disorder (HCC)   Jason Harmon is a 80 y.o. y/o male with who was brought to the hospital with anemia and was found to have heme positive stools.  Stool occult blood test has no role in evaluation of anemia and is a test for colon cancer screening and is inappropriate to be used for evaluation of anemia, as a negative or positive test would not change evaluation. The patient is unlikely to be able to take a prep for any examination of the lower GI tract.    Plan:  I have spoken to the wife about doing a colonoscopy on the patient which would be closer impossible since he will not take a prep so therefore we will set him up for an upper endoscopy to look for an upper GI source of bleeding prior to recommending any lower GI procedures.  If the upper endoscopy does not show anything then we will try and clean him out for a lower GI investigation with a scope through his ostomy.  The patient was have been explained the plan and agrees with it.  Thank you for involving me in the care of this patient.      LOS: 0 days   Lucilla Lame, MD,  [facility] 08/20/2020, 4:43 PM,  Pager [phone] 7am-5pm  Check AMION for 5pm -7am coverage and on weekends   Note: This dictation was prepared with Dragon dictation along with smaller phrase technology. Any transcriptional errors that result from this process are unintentional.

## 2020-08-21 ENCOUNTER — Encounter
Admission: EM | Disposition: A | Discharge status: Hospice | Payer: Self-pay | Source: Home / Self Care | Attending: Internal Medicine

## 2020-08-21 ENCOUNTER — Encounter: Payer: Self-pay | Admitting: Internal Medicine

## 2020-08-21 ENCOUNTER — Inpatient Hospital Stay: Payer: Medicare (Managed Care) | Admitting: Anesthesiology

## 2020-08-21 DIAGNOSIS — C159 Malignant neoplasm of esophagus, unspecified: Secondary | ICD-10-CM | POA: Diagnosis present

## 2020-08-21 DIAGNOSIS — K922 Gastrointestinal hemorrhage, unspecified: Secondary | ICD-10-CM | POA: Diagnosis not present

## 2020-08-21 DIAGNOSIS — K2289 Other specified disease of esophagus: Secondary | ICD-10-CM

## 2020-08-21 HISTORY — PX: ESOPHAGOGASTRODUODENOSCOPY (EGD) WITH PROPOFOL: SHX5813

## 2020-08-21 LAB — COMPREHENSIVE METABOLIC PANEL
ALT: 10 U/L (ref 0–44)
AST: 19 U/L (ref 15–41)
Albumin: 2.9 g/dL — ABNORMAL LOW (ref 3.5–5.0)
Alkaline Phosphatase: 65 U/L (ref 38–126)
Anion gap: 10 (ref 5–15)
BUN: 32 mg/dL — ABNORMAL HIGH (ref 8–23)
CO2: 26 mmol/L (ref 22–32)
Calcium: 8.4 mg/dL — ABNORMAL LOW (ref 8.9–10.3)
Chloride: 103 mmol/L (ref 98–111)
Creatinine, Ser: 0.82 mg/dL (ref 0.61–1.24)
GFR, Estimated: >60 mL/min (ref >60)
Glucose, Bld: 100 mg/dL — ABNORMAL HIGH (ref 70–99)
Potassium: 4.3 mmol/L (ref 3.5–5.1)
Sodium: 139 mmol/L (ref 135–145)
Total Bilirubin: 0.4 mg/dL (ref 0.3–1.2)
Total Protein: 6.3 g/dL — ABNORMAL LOW (ref 6.5–8.1)

## 2020-08-21 LAB — TYPE AND SCREEN
ABO/RH(D): A POS
Antibody Screen: NEGATIVE
Unit division: 0

## 2020-08-21 LAB — PROTIME-INR
INR: 1 (ref 0.8–1.2)
Prothrombin Time: 12.7 seconds (ref 11.4–15.2)

## 2020-08-21 LAB — CBC
HCT: 24.6 % — ABNORMAL LOW (ref 39.0–52.0)
Hemoglobin: 7.8 g/dL — ABNORMAL LOW (ref 13.0–17.0)
MCH: 31.1 pg (ref 26.0–34.0)
MCHC: 31.7 g/dL (ref 30.0–36.0)
MCV: 98 fL (ref 80.0–100.0)
Platelets: 161 10*3/uL (ref 150–400)
RBC: 2.51 MIL/uL — ABNORMAL LOW (ref 4.22–5.81)
RDW: 16.2 % — ABNORMAL HIGH (ref 11.5–15.5)
WBC: 7.4 10*3/uL (ref 4.0–10.5)
nRBC: 0.3 % — ABNORMAL HIGH (ref 0.0–0.2)

## 2020-08-21 LAB — BPAM RBC
Blood Product Expiration Date: 202203032359
ISSUE DATE / TIME: 202202031500
Unit Type and Rh: 6200

## 2020-08-21 LAB — HEMOGLOBIN AND HEMATOCRIT, BLOOD
HCT: 25.4 % — ABNORMAL LOW (ref 39.0–52.0)
Hemoglobin: 8.3 g/dL — ABNORMAL LOW (ref 13.0–17.0)

## 2020-08-21 LAB — TSH: TSH: 5.23 u[IU]/mL — ABNORMAL HIGH (ref 0.350–4.500)

## 2020-08-21 LAB — MAGNESIUM: Magnesium: 2 mg/dL (ref 1.7–2.4)

## 2020-08-21 SURGERY — ESOPHAGOGASTRODUODENOSCOPY (EGD) WITH PROPOFOL
Anesthesia: General

## 2020-08-21 MED ORDER — LIDOCAINE HCL (CARDIAC) PF 100 MG/5ML IV SOSY
PREFILLED_SYRINGE | INTRAVENOUS | Status: DC | PRN
  Administered 2020-08-21: 70 mg via INTRAVENOUS

## 2020-08-21 MED ORDER — DEXTROSE IN LACTATED RINGERS 5 % IV SOLN: INTRAVENOUS | Status: DC

## 2020-08-21 MED ORDER — PROPOFOL 500 MG/50ML IV EMUL
INTRAVENOUS | Status: DC | PRN
  Administered 2020-08-21: 130 ug/kg/min via INTRAVENOUS

## 2020-08-21 MED ORDER — SODIUM CHLORIDE 0.9 % IV SOLN: INTRAVENOUS | Status: DC

## 2020-08-21 MED ORDER — PROPOFOL 10 MG/ML IV BOLUS
INTRAVENOUS | Status: DC | PRN
  Administered 2020-08-21: 30 mg via INTRAVENOUS

## 2020-08-21 NOTE — Op Note (Signed)
[facility] Gastroenterology Patient Name: Jason Harmon Procedure Date: 08/21/2020 1:08 PM MRN: [id number] Account #: [id number] Date of Birth: [birth year] Admit Type: Outpatient Age: 80 Room: [facility] ENDO ROOM 1 Gender: Male Note Status: Finalized Procedure:             Upper GI endoscopy Indications:           [place]ena Providers:             Lucilla Lame MD, MD Medicines:             Propofol per Anesthesia Complications:         No immediate complications. Procedure:             Pre-Anesthesia Assessment:                        - Prior to the procedure, a History and Physical was                         performed, and patient medications and allergies were                         reviewed. The patient's tolerance of previous                         anesthesia was also reviewed. The risks and benefits                         of the procedure and the sedation options and risks                         were discussed with the patient. All questions were                         answered, and informed consent was obtained. Prior                         Anticoagulants: The patient has taken no previous                         anticoagulant or antiplatelet agents. ASA Grade                         Assessment: III - A patient with severe systemic                         disease. After reviewing the risks and benefits, the                         patient was deemed in satisfactory condition to                         undergo the procedure.                        After obtaining informed consent, the endoscope was                         passed under direct vision. Throughout the procedure,  the patient's blood pressure, pulse, and oxygen                         saturations were monitored continuously. The Endoscope                         was introduced through the mouth, and advanced to the                         lower third of esophagus. The upper  GI endoscopy was                         accomplished without difficulty. The patient tolerated                         the procedure well. Findings:      A large, fungating mass with bleeding and stigmata of recent bleeding       was found at the gastroesophageal junction. The mass was completely       obstructing and circumferential. Biopsies were taken with a cold forceps       for histology. 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                         Biopsied. Recommendation:        - Return patient to hospital ward for ongoing care.                        - Await pathology results. Procedure Code(s):     --- Professional ---                        [phone], Esophagoscopy, flexible, transoral; with                         biopsy, single or multiple Diagnosis Code(s):     --- Professional ---                        K92.1, Melena (includes Hematochezia)                        D49.0, Neoplasm of unspecified behavior of digestive                         system CPT copyright 2019 American Medical Association. All rights reserved. The codes documented in this report are preliminary and upon coder review may  be revised to meet current compliance requirements. Lucilla Lame MD, MD 08/21/2020 1:33:07 PM This report has been signed electronically. Number of Addenda: 0 Note Initiated On: 08/21/2020 1:08 PM Estimated Blood Loss:  Estimated blood loss: none.      [facility]

## 2020-08-21 NOTE — Anesthesia Procedure Notes (Signed)
Procedure Name: MAC Date/Time: 08/21/2020 1:29 PM Performed by: Lily Peer, Kirkland Figg, CRNA Pre-anesthesia Checklist: Patient identified, Emergency Drugs available, Suction available, Patient being monitored and Timeout performed Oxygen Delivery Method: Nasal cannula Induction Type: IV induction

## 2020-08-21 NOTE — Progress Notes (Signed)
Progress Note    Jason Harmon  DQQ:[id number] DOB: May 15, 1941  DOA: 08/20/2020 PCP: [name], MD      Brief Narrative:    Medical records reviewed and are as summarized below:  Jason Harmon is a 80 y.o. male       Assessment/Plan:   Principal Problem:   Acute upper GI bleed Active Problems:   AF (paroxysmal atrial fibrillation) (HCC)   Hypothyroidism   Acute on chronic anemia   Generalized weakness   Seizure disorder ([place])   Esophageal mass   Body mass index is 23.74 kg/m.  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  Awaiting biopsy report.  Keep n.p.o. for now.  Case discussed with Dr. Verl Blalock, gastroenterologist, who said patient will likely need PEG tube for nutrition unless family wants to proceed with hospice.  Acute blood loss anemia, Iron deficiency anemia, chronic anemia  : Hemoglobin improved s/p transfusion with 1 unit of packed red blood cells.  Monitor H&H and transfuse as needed.  Paroxysmal atrial fibrillation: Anticoagulation is contraindicated because of bleeding.  Generalized weakness: PT and OT evaluation.  Seizure disorder: Continue home Depakote  I called his daughter, [name], who said to talk to patient's wife who is the Media planner.  I could not reach patient's wife by phone.   Diet Order            Diet NPO time specified  Diet effective midnight                    Consultants:  Gastroenterologist  Procedures:  EGD    Medications:   . sodium chloride   Intravenous Once  . pantoprazole (PROTONIX) IV  40 mg Intravenous Q12H   Continuous Infusions:   Anti-infectives (From admission, onward)   None             Family Communication/Anticipated D/C date and plan/Code Status   DVT prophylaxis: SCDs Start: 08/20/20 1355     Code Status: DNR  Family Communication: None Disposition Plan:     Status is: Inpatient  Remains inpatient appropriate because:Inpatient level of care appropriate due to severity of illness   Dispo: The patient is from: Home              Anticipated d/c is to: Home              Anticipated d/c date is: 2 days              Patient currently is not medically stable to d/c.   Difficult to place patient No           Subjective:   Interval events noted.  He is confused and unable to provide any history.  His nurse, Bethena Roys, was at the bedside.  Objective:    Vitals:   08/21/20 1332 08/21/20 1342 08/21/20 1352 08/21/20 1409  BP: 102/68 103/70 97/64 94/73   Pulse: (!) 108 (!) 112 (!) 110 100  Resp: 18 16 20 20   Temp: 99.9 F (37.7 C)   98.9 F (37.2 C)  TempSrc: Temporal   Oral  SpO2: 94%     Weight:       No data found.   Intake/Output Summary (Last 24 hours) at 08/21/2020 1554 Last data filed at 08/21/2020 1334 Gross per 24 hour  Intake 100 ml  Output --  Net 100 ml  Filed Weights   08/21/20 0330  Weight: 66.7 kg    Exam:  GEN: NAD SKIN: Warm and dry EYES: Pale but anicteric ENT: MMM CV: RRR PULM: CTA B ABD: soft, ND, NT, +BS, + colostomy with bloody stools CNS: Alert but confused. EXT: No edema or tenderness    Data Reviewed:   I have personally reviewed following labs and imaging studies:  Labs: Labs show the following:   Basic Metabolic Panel: Recent Labs  Lab 08/20/20 1208 08/20/20 1347 08/21/20 0048  NA 143  --  139  K 4.4  --  4.3  CL 102  --  103  CO2 29  --  26  GLUCOSE 114*  --  100*  BUN 34*  --  32*  CREATININE 0.93  --  0.82  CALCIUM 9.0  --  8.4*  MG  --  2.1 2.0   GFR Estimated Creatinine Clearance: 68.1 mL/min (by C-G formula based on SCr of 0.82 mg/dL). Liver Function Tests: Recent Labs  Lab 08/21/20 0048  AST 19  ALT 10  ALKPHOS 65  BILITOT 0.4  PROT 6.3*  ALBUMIN 2.9*   No results for input(s): LIPASE, AMYLASE in the last 168 hours. No results for input(s): AMMONIA  in the last 168 hours. Coagulation profile Recent Labs  Lab 08/20/20 1208 08/21/20 0048  INR 1.0 1.0    CBC: Recent Labs  Lab 08/20/20 1208 08/20/20 1923 08/21/20 0048 08/21/20 0847  WBC 6.9  --  7.4  --   HGB 6.9* 7.7* 7.8* 8.3*  HCT 22.4* 23.7* 24.6* 25.4*  MCV 103.2*  --  98.0  --   PLT 189  --  161  --    Cardiac Enzymes: No results for input(s): CKTOTAL, CKMB, CKMBINDEX, TROPONINI in the last 168 hours. BNP (last 3 results) No results for input(s): PROBNP in the last 8760 hours. CBG: No results for input(s): GLUCAP in the last 168 hours. D-Dimer: No results for input(s): DDIMER in the last 72 hours. Hgb A1c: No results for input(s): HGBA1C in the last 72 hours. Lipid Profile: No results for input(s): CHOL, HDL, LDLCALC, TRIG, CHOLHDL, LDLDIRECT in the last 72 hours. Thyroid function studies: Recent Labs    08/21/20 0048  TSH 5.230*   Anemia work up: Recent Labs    08/20/20 1208  FOLATE 38.0  FERRITIN 239  TIBC 372  IRON 53  RETICCTPCT 4.9*   Sepsis Labs: Recent Labs  Lab 08/20/20 1208 08/21/20 0048  WBC 6.9 7.4    Microbiology Recent Results (from the past 240 hour(s))  SARS Coronavirus 2 by RT PCR (hospital order, performed in [facility] hospital lab) Nasopharyngeal Nasopharyngeal Swab     Status: None   Collection Time: 08/20/20  1:30 PM   Specimen: Nasopharyngeal Swab  Result Value Ref Range Status   SARS Coronavirus 2 NEGATIVE NEGATIVE Final    Comment: (NOTE) SARS-CoV-2 target nucleic acids are NOT DETECTED.  The SARS-CoV-2 RNA is generally detectable in upper and lower respiratory specimens during the acute phase of infection. The lowest concentration of SARS-CoV-2 viral copies this assay can detect is 250 copies / mL. A negative result does not preclude SARS-CoV-2 infection and should not be used as the sole basis for treatment or other patient management decisions.  A negative result may occur with improper specimen collection /  handling, submission of specimen other than nasopharyngeal swab, presence of viral mutation(s) within the areas targeted by this assay, and inadequate number of viral copies (<250  copies / mL). A negative result must be combined with clinical observations, patient history, and epidemiological information.  Fact Sheet for Patients:   StrictlyIdeas.no  Fact Sheet for Healthcare Providers: BankingDealers.co.za  This test is not yet approved or  cleared by the [place] FDA and has been authorized for detection and/or diagnosis of SARS-CoV-2 by FDA under an Emergency Use Authorization (EUA).  This EUA will remain in effect (meaning this test can be used) for the duration of the COVID-19 declaration under Section 564(b)(1) of the Act, 21 U.S.C. section 360bbb-3(b)(1), unless the authorization is terminated or revoked sooner.  Performed at [facility], [address] [place], [place] [address]     Procedures and diagnostic studies:  No results found.             LOS: 1 day   Brendia Dampier  [facility]   Pager on [email or web address]. If 7PM-7AM, please contact night-coverage at [email or web address]     08/21/2020, 3:54 PM

## 2020-08-21 NOTE — Transfer of Care (Signed)
Immediate Anesthesia Transfer of Care Note  Patient: Jason Harmon  Procedure(s) Performed: ESOPHAGOGASTRODUODENOSCOPY (EGD) WITH PROPOFOL (N/A )  Patient Location: PACU and Endoscopy Unit  Anesthesia Type:General  Level of Consciousness: drowsy  Airway & Oxygen Therapy: Patient Spontanous Breathing  Post-op Assessment: Report given to RN and Post -op Vital signs reviewed and stable  Post vital signs: Reviewed and stable  Last Vitals:  Vitals Value Taken Time  BP 105/68   Temp    Pulse 110   Resp 14   SpO2 95     Last Pain:  Vitals:   08/21/20 1214  TempSrc: Temporal  PainSc: 0-No pain         Complications: No complications documented.

## 2020-08-21 NOTE — Progress Notes (Signed)
Pt is out of the room. Pt wife is in the room waiting on pt to come back from EDG.

## 2020-08-21 NOTE — Anesthesia Preprocedure Evaluation (Signed)
Anesthesia Evaluation  Patient identified by MRN, date of birth, ID band Patient awake    Reviewed: Allergy & Precautions, H&P , NPO status , Patient's Chart, lab work & pertinent test results, reviewed documented beta blocker date and time   History of Anesthesia Complications Negative for: history of anesthetic complications  Airway Mallampati: II  TM Distance: >3 FB Neck ROM: full    Dental  (+) Dental Advidsory Given, Poor Dentition, Missing, Chipped   Pulmonary neg pulmonary ROS,    Pulmonary exam normal breath sounds clear to auscultation       Cardiovascular Exercise Tolerance: Good (-) hypertension(-) angina(-) Past MI and (-) Cardiac Stents + dysrhythmias Atrial Fibrillation (-) Valvular Problems/Murmurs Rhythm:regular Rate:Normal     Neuro/Psych Seizures -, Well Controlled,  PSYCHIATRIC DISORDERS Dementia    GI/Hepatic negative GI ROS, Neg liver ROS,   Endo/Other  neg diabetesHypothyroidism   Renal/GU Renal disease  negative genitourinary   Musculoskeletal   Abdominal   Peds  Hematology negative hematology ROS (+)   Anesthesia Other Findings Past Medical History: No date: A-fib (HCC) No date: Acute renal failure (HCC) No date: Ischemic bowel disease (HCC) No date: Renal disorder No date: Sepsis ([place])   Reproductive/Obstetrics negative OB ROS                             Anesthesia Physical Anesthesia Plan  ASA: III  Anesthesia Plan: General   Post-op Pain Management:    Induction: Intravenous  PONV Risk Score and Plan: 2 and TIVA and Propofol infusion  Airway Management Planned: Natural Airway and Nasal Cannula  Additional Equipment:   Intra-op Plan:   Post-operative Plan: Extubation in OR  Informed Consent: I have reviewed the patients History and Physical, chart, labs and discussed the procedure including the risks, benefits and alternatives for the proposed  anesthesia with the patient or authorized representative who has indicated his/her understanding and acceptance.     Dental Advisory Given  Plan Discussed with: Anesthesiologist, CRNA and Surgeon  Anesthesia Plan Comments:         Anesthesia Quick Evaluation

## 2020-08-22 LAB — BASIC METABOLIC PANEL
Anion gap: 7 (ref 5–15)
BUN: 30 mg/dL — ABNORMAL HIGH (ref 8–23)
CO2: 24 mmol/L (ref 22–32)
Calcium: 8.1 mg/dL — ABNORMAL LOW (ref 8.9–10.3)
Chloride: 107 mmol/L (ref 98–111)
Creatinine, Ser: 0.97 mg/dL (ref 0.61–1.24)
GFR, Estimated: >60 mL/min (ref >60)
Glucose, Bld: 131 mg/dL — ABNORMAL HIGH (ref 70–99)
Potassium: 3.9 mmol/L (ref 3.5–5.1)
Sodium: 138 mmol/L (ref 135–145)

## 2020-08-22 LAB — CBC WITH DIFFERENTIAL/PLATELET
Abs Immature Granulocytes: 0.07 10*3/uL (ref 0.00–0.07)
Basophils Absolute: 0 10*3/uL (ref 0.0–0.1)
Basophils Relative: 0 %
Eosinophils Absolute: 0 10*3/uL (ref 0.0–0.5)
Eosinophils Relative: 0 %
HCT: 23.3 % — ABNORMAL LOW (ref 39.0–52.0)
Hemoglobin: 7.7 g/dL — ABNORMAL LOW (ref 13.0–17.0)
Immature Granulocytes: 1 %
Lymphocytes Relative: 16 %
Lymphs Abs: 1.6 10*3/uL (ref 0.7–4.0)
MCH: 32.1 pg (ref 26.0–34.0)
MCHC: 33 g/dL (ref 30.0–36.0)
MCV: 97.1 fL (ref 80.0–100.0)
Monocytes Absolute: 1 10*3/uL (ref 0.1–1.0)
Monocytes Relative: 10 %
Neutro Abs: 7 10*3/uL (ref 1.7–7.7)
Neutrophils Relative %: 73 %
Platelets: 171 10*3/uL (ref 150–400)
RBC: 2.4 MIL/uL — ABNORMAL LOW (ref 4.22–5.81)
RDW: 14.6 % (ref 11.5–15.5)
WBC: 9.7 10*3/uL (ref 4.0–10.5)
nRBC: 0 % (ref 0.0–0.2)

## 2020-08-22 NOTE — Progress Notes (Signed)
Patient's wife at bedside and refusing to accept patients NPO status. Wife is feeding patient soft foods per PCP/PACE provider. However hospital  MD orders states to continue to be NPO. Patient's wife verbalized understating but still continues to feed patient. Reinforced more teachings and possible risk factors. MD aware and no new orders given at this time.

## 2020-08-22 NOTE — Progress Notes (Signed)
Progress Note    Jason Harmon  [id number] DOB: 1941/04/15  DOA: 08/20/2020 PCP: [name], MD      Brief Narrative:    Medical records reviewed and are as summarized below:  Jason Harmon is a 80 y.o. adult       Assessment/Plan:   Principal Problem:   Acute upper GI bleed Active Problems:   AF (paroxysmal atrial fibrillation) (HCC)   Hypothyroidism   Acute on chronic anemia   Generalized weakness   Seizure disorder (HCC)   Esophageal mass   Body mass index is 23.98 kg/m.  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  Awaiting biopsy report.  Discussed options including PEG tube placement and comfort care with hospice with the patient's wife.  She had a hard time making a decision and she requested that I speak to patient's PCP Dr. Lorenza Burton.  She decided against PEG tube placement for nutrition.  She said she preferred that the patient be fed by bronchodilators same time she was also worried about the potential risks such as aspiration pneumonia and respiratory failure.   I spoke to Dr. Lorenza Burton who said she works with [facility] and they will be able to provide hospice services at home if patient decides to go that route. For now, patient will remain nil by mouth and will continue with IV fluids for hydration and calories.  Acute blood loss anemia, Iron deficiency anemia, chronic anemia  : Hemoglobin improved s/p transfusion with 1 unit of packed red blood cells on 08/20/2020.  Monitor H&H and transfuse as needed.  Paroxysmal atrial fibrillation: Anticoagulation is contraindicated because of bleeding.  Generalized weakness: PT and OT evaluation.  Seizure disorder: Continue home Depakote     Diet Order            Diet NPO time specified  Diet effective midnight                    Consultants:  Gastroenterologist  Procedures:  EGD on  08/21/2020    Medications:   . sodium chloride   Intravenous Once  . pantoprazole (PROTONIX) IV  40 mg Intravenous Q12H   Continuous Infusions: . dextrose 5% lactated ringers 75 mL/hr at 08/22/20 8527     Anti-infectives (From admission, onward)   None             Family Communication/Anticipated D/C date and plan/Code Status   DVT prophylaxis: SCDs Start: 08/20/20 1355     Code Status: DNR  Family Communication: None Disposition Plan:    Status is: Inpatient  Remains inpatient appropriate because:Inpatient level of care appropriate due to severity of illness   Dispo: The patient is from: Home              Anticipated d/c is to: Home              Anticipated d/c date is: 2 days              Patient currently is not medically stable to d/c.   Difficult to place patient No           Subjective:   He is confused and cannot provide any history.  His wife is at the bedside.  Objective:    Vitals:   08/22/20 0100 08/22/20 0435 08/22/20 0736 08/22/20 1109  BP:  (!) 142/109 124/60 (!) 125/95  Pulse:  (!) 110 (!) 101 (!) 108  Resp:  18 16 16   Temp:  99.6 F (37.6 C) 98 F (36.7 C) 98.7 F (37.1 C)  TempSrc:  Oral  Oral  SpO2:  97% 93% 96%  Weight: 67.4 kg      No data found.  No intake or output data in the 24 hours ending 08/22/20 1548 Filed Weights   08/21/20 0330 08/22/20 0100  Weight: 66.7 kg 67.4 kg    Exam:  GEN: NAD SKIN: Warm and dry EYES: No pallor or icterus ENT: MMM CV: RRR PULM: CTA B ABD: soft, ND, NT, +BS, +colostomy CNS: Alert but confused, non focal EXT: No edema or tenderness     Data Reviewed:   I have personally reviewed following labs and imaging studies:  Labs: Labs show the following:   Basic Metabolic Panel: Recent Labs  Lab 08/20/20 1208 08/20/20 1347 08/21/20 0048 08/22/20 0436  NA 143  --  139 138  K 4.4  --  4.3 3.9  CL 102  --  103 107  CO2 29  --  26 24  GLUCOSE 114*  --  100* 131*   BUN 34*  --  32* 30*  CREATININE 0.93  --  0.82 0.97  CALCIUM 9.0  --  8.4* 8.1*  MG  --  2.1 2.0  --    GFR Estimated Creatinine Clearance (by C-G formula based on SCr of 0.97 mg/dL) Male: 45.5 mL/min Male: 57.6 mL/min Liver Function Tests: Recent Labs  Lab 08/21/20 0048  AST 19  ALT 10  ALKPHOS 65  BILITOT 0.4  PROT 6.3*  ALBUMIN 2.9*   No results for input(s): LIPASE, AMYLASE in the last 168 hours. No results for input(s): AMMONIA in the last 168 hours. Coagulation profile Recent Labs  Lab 08/20/20 1208 08/21/20 0048  INR 1.0 1.0    CBC: Recent Labs  Lab 08/20/20 1208 08/20/20 1923 08/21/20 0048 08/21/20 0847 08/22/20 0436  WBC 6.9  --  7.4  --  9.7  NEUTROABS  --   --   --   --  7.0  HGB 6.9* 7.7* 7.8* 8.3* 7.7*  HCT 22.4* 23.7* 24.6* 25.4* 23.3*  MCV 103.2*  --  98.0  --  97.1  PLT 189  --  161  --  171   Cardiac Enzymes: No results for input(s): CKTOTAL, CKMB, CKMBINDEX, TROPONINI in the last 168 hours. BNP (last 3 results) No results for input(s): PROBNP in the last 8760 hours. CBG: No results for input(s): GLUCAP in the last 168 hours. D-Dimer: No results for input(s): DDIMER in the last 72 hours. Hgb A1c: No results for input(s): HGBA1C in the last 72 hours. Lipid Profile: No results for input(s): CHOL, HDL, LDLCALC, TRIG, CHOLHDL, LDLDIRECT in the last 72 hours. Thyroid function studies: Recent Labs    08/21/20 0048  TSH 5.230*   Anemia work up: Recent Labs    08/20/20 1208  FOLATE 38.0  FERRITIN 239  TIBC 372  IRON 53  RETICCTPCT 4.9*   Sepsis Labs: Recent Labs  Lab 08/20/20 1208 08/21/20 0048 08/22/20 0436  WBC 6.9 7.4 9.7    Microbiology Recent Results (from the past 240 hour(s))  SARS Coronavirus 2 by RT PCR (hospital order, performed in [facility] hospital lab) Nasopharyngeal Nasopharyngeal Swab     Status: None   Collection Time: 08/20/20  1:30 PM   Specimen: Nasopharyngeal Swab  Result Value Ref Range Status    SARS Coronavirus  2 NEGATIVE NEGATIVE Final    Comment: (NOTE) SARS-CoV-2 target nucleic acids are NOT DETECTED.  The SARS-CoV-2 RNA is generally detectable in upper and lower respiratory specimens during the acute phase of infection. The lowest concentration of SARS-CoV-2 viral copies this assay can detect is 250 copies / mL. A negative result does not preclude SARS-CoV-2 infection and should not be used as the sole basis for treatment or other patient management decisions.  A negative result may occur with improper specimen collection / handling, submission of specimen other than nasopharyngeal swab, presence of viral mutation(s) within the areas targeted by this assay, and inadequate number of viral copies (<250 copies / mL). A negative result must be combined with clinical observations, patient history, and epidemiological information.  Fact Sheet for Patients:   StrictlyIdeas.no  Fact Sheet for Healthcare Providers: BankingDealers.co.za  This test is not yet approved or  cleared by the [place] FDA and has been authorized for detection and/or diagnosis of SARS-CoV-2 by FDA under an Emergency Use Authorization (EUA).  This EUA will remain in effect (meaning this test can be used) for the duration of the COVID-19 declaration under Section 564(b)(1) of the Act, 21 U.S.C. section 360bbb-3(b)(1), unless the authorization is terminated or revoked sooner.  Performed at [facility], [address] [place], [place] [address]     Procedures and diagnostic studies:  No results found.             LOS: 2 days   Kadia Abaya  [facility]   Pager on [email or web address]. If 7PM-7AM, please contact night-coverage at [email or web address]     08/22/2020, 3:48 PM

## 2020-08-23 LAB — CBC
HCT: 24.9 % — ABNORMAL LOW (ref 39.0–52.0)
Hemoglobin: 7.9 g/dL — ABNORMAL LOW (ref 13.0–17.0)
MCH: 31.2 pg (ref 26.0–34.0)
MCHC: 31.7 g/dL (ref 30.0–36.0)
MCV: 98.4 fL (ref 80.0–100.0)
Platelets: 193 10*3/uL (ref 150–400)
RBC: 2.53 MIL/uL — ABNORMAL LOW (ref 4.22–5.81)
RDW: 14.4 % (ref 11.5–15.5)
WBC: 8 10*3/uL (ref 4.0–10.5)
nRBC: 0 % (ref 0.0–0.2)

## 2020-08-23 LAB — BASIC METABOLIC PANEL
Anion gap: 11 (ref 5–15)
BUN: 19 mg/dL (ref 8–23)
CO2: 23 mmol/L (ref 22–32)
Calcium: 8.6 mg/dL — ABNORMAL LOW (ref 8.9–10.3)
Chloride: 103 mmol/L (ref 98–111)
Creatinine, Ser: 0.86 mg/dL (ref 0.61–1.24)
GFR, Estimated: >60 mL/min (ref >60)
Glucose, Bld: 128 mg/dL — ABNORMAL HIGH (ref 70–99)
Potassium: 4 mmol/L (ref 3.5–5.1)
Sodium: 137 mmol/L (ref 135–145)

## 2020-08-23 NOTE — Progress Notes (Signed)
Wife verbalized understanding of agreeing to hospice for patient's best interest of care at home and having hospice consult with them at hospital before discharging. Writer and Attending [place] at bedside and all aware of patients/wife wants. No other concerns verbalized at this time. See orders.

## 2020-08-23 NOTE — Plan of Care (Signed)

## 2020-08-23 NOTE — Progress Notes (Addendum)
Progress Note    Jason Harmon  [id number] DOB: 1941/07/08  DOA: 08/20/2020 PCP: [name], MD      Brief Narrative:    Medical records reviewed and are as summarized below:  Jason Harmon is a 80 y.o. adult       Assessment/Plan:   Principal Problem:   Acute upper GI bleed Active Problems:   AF (paroxysmal atrial fibrillation) (HCC)   Hypothyroidism   Acute on chronic anemia   Generalized weakness   Seizure disorder ([place])   Esophageal mass   Body mass index is 24.02 kg/m.  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  Awaiting biopsy report. His wife has decided to proceed with hospice at home. She said she was previously undecided about hospice because she was concerned that patient will be sent to hospice facility. However, she prefers hospice at home. She said patient has been on hospice before the family with the program. Patient will be set up with hospice team tomorrow for since they are not available today. Patient will be given a regular diet for pleasure feeding. His wife understands that he said very high risk for choking/aspiration but can be complicated by acute respiratory failure and death. Arielle, RN was at the bedside during this encounter.  Acute blood loss anemia, Iron deficiency anemia, chronic anemia  : Hemoglobin improved s/p transfusion with 1 unit of packed red blood cells on 08/20/2020.   Paroxysmal atrial fibrillation: Anticoagulation is contraindicated because of bleeding.  Generalized weakness: Supportive care  Seizure disorder: Continue home Depakote     Diet Order            Diet NPO time specified  Diet effective midnight                    Consultants:  Gastroenterologist  Procedures:  EGD on 08/21/2020    Medications:   . sodium chloride   Intravenous Once  . pantoprazole (PROTONIX) IV  40 mg  Intravenous Q12H   Continuous Infusions: . dextrose 5% lactated ringers 75 mL/hr at 08/23/20 0041     Anti-infectives (From admission, onward)   None             Family Communication/Anticipated D/C date and plan/Code Status   DVT prophylaxis: SCDs Start: 08/20/20 1355     Code Status: DNR  Family Communication: None Disposition Plan:    Status is: Inpatient  Remains inpatient appropriate because:Unsafe d/c plan   Dispo: The patient is from: Home              Anticipated d/c is to: Home              Anticipated d/c date is: 1 day              Patient currently is not medically stable to d/c.   Difficult to place patient No           Subjective:   Interval events noted. His wife is at the bedside. His wife said patient is more communicative and alert.  Objective:    Vitals:   08/22/20 2039 08/23/20 0356 08/23/20 0541 08/23/20 0800  BP: 116/66  (!) 143/71 115/69  Pulse: (!) 101  93 94  Resp: 18  20 16   Temp: 99.2 F (37.3 C)  98.3 F (36.8 C) 97.6 F (36.4 C)  TempSrc: Oral  Oral  Oral  SpO2: 100%  97% 98%  Weight:  67.4 kg 67.5 kg    No data found.   Intake/Output Summary (Last 24 hours) at 08/23/2020 1207 Last data filed at 08/23/2020 0300 Gross per 24 hour  Intake 1703.53 ml  Output --  Net 1703.53 ml   Filed Weights   08/22/20 0100 08/23/20 0356 08/23/20 0541  Weight: 67.4 kg 67.4 kg 67.5 kg    Exam:  GEN: NAD SKIN: Warm and dry EYES: No pallor or icterus ENT: MMM CV: RRR PULM: CTA B ABD: soft, ND, NT, +BS CNS: Alert but disoriented. Nonfocal EXT: No edema or tenderness      Data Reviewed:   I have personally reviewed following labs and imaging studies:  Labs: Labs show the following:   Basic Metabolic Panel: Recent Labs  Lab 08/20/20 1208 08/20/20 1347 08/21/20 0048 08/22/20 0436 08/23/20 0553  NA 143  --  139 138 137  K 4.4  --  4.3 3.9 4.0  CL 102  --  103 107 103  CO2 29  --  26 24 23   GLUCOSE  114*  --  100* 131* 128*  BUN 34*  --  32* 30* 19  CREATININE 0.93  --  0.82 0.97 0.86  CALCIUM 9.0  --  8.4* 8.1* 8.6*  MG  --  2.1 2.0  --   --    GFR Estimated Creatinine Clearance (by C-G formula based on SCr of 0.86 mg/dL) Male: 51.3 mL/min Male: 64.9 mL/min Liver Function Tests: Recent Labs  Lab 08/21/20 0048  AST 19  ALT 10  ALKPHOS 65  BILITOT 0.4  PROT 6.3*  ALBUMIN 2.9*   No results for input(s): LIPASE, AMYLASE in the last 168 hours. No results for input(s): AMMONIA in the last 168 hours. Coagulation profile Recent Labs  Lab 08/20/20 1208 08/21/20 0048  INR 1.0 1.0    CBC: Recent Labs  Lab 08/20/20 1208 08/20/20 1923 08/21/20 0048 08/21/20 0847 08/22/20 0436 08/23/20 0553  WBC 6.9  --  7.4  --  9.7 8.0  NEUTROABS  --   --   --   --  7.0  --   HGB 6.9* 7.7* 7.8* 8.3* 7.7* 7.9*  HCT 22.4* 23.7* 24.6* 25.4* 23.3* 24.9*  MCV 103.2*  --  98.0  --  97.1 98.4  PLT 189  --  161  --  171 193   Cardiac Enzymes: No results for input(s): CKTOTAL, CKMB, CKMBINDEX, TROPONINI in the last 168 hours. BNP (last 3 results) No results for input(s): PROBNP in the last 8760 hours. CBG: No results for input(s): GLUCAP in the last 168 hours. D-Dimer: No results for input(s): DDIMER in the last 72 hours. Hgb A1c: No results for input(s): HGBA1C in the last 72 hours. Lipid Profile: No results for input(s): CHOL, HDL, LDLCALC, TRIG, CHOLHDL, LDLDIRECT in the last 72 hours. Thyroid function studies: Recent Labs    08/21/20 0048  TSH 5.230*   Anemia work up: Recent Labs    08/20/20 1208  FOLATE 38.0  FERRITIN 239  TIBC 372  IRON 53  RETICCTPCT 4.9*   Sepsis Labs: Recent Labs  Lab 08/20/20 1208 08/21/20 0048 08/22/20 0436 08/23/20 0553  WBC 6.9 7.4 9.7 8.0    Microbiology Recent Results (from the past 240 hour(s))  SARS Coronavirus 2 by RT PCR (hospital order, performed in [facility] hospital lab) Nasopharyngeal Nasopharyngeal Swab     Status:  None   Collection Time: 08/20/20  1:30  PM   Specimen: Nasopharyngeal Swab  Result Value Ref Range Status   SARS Coronavirus 2 NEGATIVE NEGATIVE Final    Comment: (NOTE) SARS-CoV-2 target nucleic acids are NOT DETECTED.  The SARS-CoV-2 RNA is generally detectable in upper and lower respiratory specimens during the acute phase of infection. The lowest concentration of SARS-CoV-2 viral copies this assay can detect is 250 copies / mL. A negative result does not preclude SARS-CoV-2 infection and should not be used as the sole basis for treatment or other patient management decisions.  A negative result may occur with improper specimen collection / handling, submission of specimen other than nasopharyngeal swab, presence of viral mutation(s) within the areas targeted by this assay, and inadequate number of viral copies (<250 copies / mL). A negative result must be combined with clinical observations, patient history, and epidemiological information.  Fact Sheet for Patients:   StrictlyIdeas.no  Fact Sheet for Healthcare Providers: BankingDealers.co.za  This test is not yet approved or  cleared by the [place] FDA and has been authorized for detection and/or diagnosis of SARS-CoV-2 by FDA under an Emergency Use Authorization (EUA).  This EUA will remain in effect (meaning this test can be used) for the duration of the COVID-19 declaration under Section 564(b)(1) of the Act, 21 U.S.C. section 360bbb-3(b)(1), unless the authorization is terminated or revoked sooner.  Performed at [facility], [address] [place], [place] [address]     Procedures and diagnostic studies:  No results found.             LOS: 3 days   Zowie Lundahl  [facility]   Pager on [email or web address]. If 7PM-7AM, please contact night-coverage at [email or web address]     08/23/2020, 12:07 PM

## 2020-08-24 DIAGNOSIS — K922 Gastrointestinal hemorrhage, unspecified: Secondary | ICD-10-CM | POA: Diagnosis not present

## 2020-08-24 DIAGNOSIS — C155 Malignant neoplasm of lower third of esophagus: Secondary | ICD-10-CM

## 2020-08-24 LAB — HEMOGLOBIN: Hemoglobin: 8.1 g/dL — ABNORMAL LOW (ref 13.0–17.0)

## 2020-08-24 LAB — METHYLMALONIC ACID, SERUM: Methylmalonic Acid, Quantitative: 337 nmol/L (ref 0–378)

## 2020-08-24 LAB — SURGICAL PATHOLOGY

## 2020-08-24 MED ORDER — MORPHINE SULFATE (PF) 2 MG/ML IV SOLN
1.0000 mg | INTRAVENOUS | Status: DC | PRN
  Administered 2020-08-25: 1 mg via INTRAVENOUS
  Filled 2020-08-24: qty 1

## 2020-08-24 MED ORDER — LORAZEPAM 2 MG/ML IJ SOLN
1.0000 mg | INTRAMUSCULAR | Status: DC | PRN
  Administered 2020-08-24 – 2020-08-25 (×2): 1 mg via INTRAVENOUS
  Filled 2020-08-24 (×2): qty 1

## 2020-08-24 NOTE — Consult Note (Signed)
Hematology/Oncology Consult note [facility] [phone] [phone]  Patient Care Team: [name], MD as PCP - General (Family Medicine)   Name of the patient: Jason Harmon  [id number]  [birth year]    Reason for consult: New diagnosis of esophageal adenocarcinoma   Requesting physician: Dr. Mal Misty  Date of visit: 08/24/20   History of presenting illness- Patient is a 80 year old male with a past medical history significant for ischemic bowel disease and bowel perforation s/p diverting colostomy in February 2015.  Other past medical history includes dementia, seizure disorder, hypothyroidism.  He is under pace program and Dr.Mouw is his primary care doctor.  Over the last 5 to 6 months patient has been getting weaker with gradual difficulty swallowing.  He did not verbalize much to his primary care doctor and it was thought that it was his underlying dementia that was leading to his overall decline.  During his routine outpatient blood work he was found to have a hemoglobin of 8 which had drifted down from his baseline of 10 and on repeat check it had gone down to 6 and therefore patient was hospitalized.  GI was consulted and patient underwent EGD which showed a large fungating mass with bleeding and stigmata of recent bleeding at the GE junction.  Scope could not be passed beyond the mass.  Biopsy showed adenocarcinoma.  Given his overall comorbidities and performance status of 4 patient's wife has discussed with Dr. Wilford Grist and plan was to take him home with hospice.  However patient family desired medical oncology opinion to weigh their options.  ECOG PS- 4  Pain scale-unable to quantify   Review of systems- Review of Systems  Unable to perform ROS: Dementia    Allergies  Allergen Reactions  . Penicillins   . Sulfa Antibiotics   . Vancomycin Rash  . Zosyn [Piperacillin Sod-Tazobactam So] Rash    Patient Active Problem List    Diagnosis Date Noted  . Esophageal mass   . Acute upper GI bleed 08/20/2020  . Acute on chronic anemia 08/20/2020  . Generalized weakness 08/20/2020  . Seizure disorder ([place]) 08/20/2020  . Drug-induced skin rash 11/04/2019  . FTT (failure to thrive) in adult 11/04/2019  . Pneumoperitoneum 10/30/2019  . Hypothyroidism 10/29/2019  . Seizure ([place]) 10/29/2019  . Lactic acidosis 10/29/2019  . Hypothermia 10/29/2019  . Pleural effusion 09/11/2013  . Palliative care encounter 09/03/2013  . 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 Hemoptysis 08/26/2013  . Bowel perforation ([place]) 08/18/2013  . Acute and chronic respiratory failure 08/18/2013  . Septic shock ([place]) 08/18/2013  . Pulmonary edema 08/12/2013  . Pneumonia 08/12/2013  . Acute respiratory failure ([place]) 08/12/2013  . Tracheostomy status ([place]) 08/12/2013  . Pneumonia, organism unspecified(486) 08/12/2013  . Acute on chronic respiratory failure ([place]) 08/09/2013  . Acute respiratory failure following trauma and surgery ([place]) 08/07/2013  . AF (paroxysmal atrial fibrillation) ([place]) 08/07/2013  . 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 Hyperglycemia 08/07/2013  . Acute renal failure ([place]) 08/07/2013  . Acute blood loss anemia 08/07/2013  . Acute vascular insufficiency of intestine ([place]) 08/07/2013     Past Medical History:  Diagnosis Date  . A-fib ([place])   . Acute renal failure ([place])   . Ischemic bowel disease ([place])   . Renal disorder   . Sepsis [facility])      Past Surgical History:  Procedure Laterality Date  . ABDOMINAL SURGERY    . COLOSTOMY N/A  08/20/2013   Procedure: COLOSTOMY;  Surgeon: Rolm Bookbinder, MD;  Location: [place];  Service: General;  Laterality: N/A;  . ESOPHAGOGASTRODUODENOSCOPY (EGD) WITH PROPOFOL N/A 08/21/2020   Procedure: ESOPHAGOGASTRODUODENOSCOPY (EGD) WITH PROPOFOL;  Surgeon: Lucilla Lame, MD;  Location: [facility] ENDOSCOPY;  Service: Endoscopy;  Laterality:  N/A;  . GASTROSTOMY N/A 08/20/2013   Procedure: G-TUBE PLACEMENT;  Surgeon: Rolm Bookbinder, MD;  Location: [facility];  Service: General;  Laterality: N/A;  . LAPAROTOMY N/A 08/18/2013   Procedure: EXPLORATORY LAPAROTOMY;  Surgeon: Joyice Faster. Cornett, MD;  Location: [place];  Service: General;  Laterality: N/A;  . LAPAROTOMY N/A 08/20/2013   Procedure: EXPLORATORY LAPAROTOMY WITH  SMALL BOWEL RESECTION;  Surgeon: Rolm Bookbinder, MD;  Location: [place];  Service: General;  Laterality: N/A;    Social History   Socioeconomic History  . Marital status: Married    Spouse name: Not on file  . Number of children: Not on file  . Years of education: Not on file  . Highest education level: Not on file  Occupational History  . Not on file  Tobacco Use  . Smoking status: Never Smoker  . Smokeless tobacco: Never Used  Substance and Sexual Activity  . Alcohol use: No  . Drug use: No  . Sexual activity: Not on file  Other Topics Concern  . Not on file  Social History Narrative  . Not on file   Social Determinants of Health   Financial Resource Strain: Not on file  Food Insecurity: Not on file  Transportation Needs: Not on file  Physical Activity: Not on file  Stress: Not on file  Social Connections: Not on file  Intimate Partner Violence: Not on file     Family History  Problem Relation Age of Onset  . Aneurysm Mother      Current Facility-Administered Medications:  .  0.9 %  sodium chloride infusion (Manually program via Guardrails IV Fluids), , Intravenous, Once, Howerter, Justin B, DO .  LORazepam (ATIVAN) injection 1 mg, 1 mg, Intravenous, Q4H PRN, Jennye Boroughs, MD, 1 mg at 08/24/20 1444 .  morphine 2 MG/ML injection 1 mg, 1 mg, Intravenous, Q3H PRN, Jennye Boroughs, MD .  ondansetron [facility]) injection 4 mg, 4 mg, Intravenous, Q6H PRN, Rhetta Mura, DO   Physical exam:  Vitals:   08/23/20 2333 08/24/20 0348 08/24/20 0718 08/24/20 1100  BP: (!) 112/52 (!) 100/45 113/66  108/62  Pulse: 80 92 90 93  Resp: 17 20 16 18   Temp: 99.7 F (37.6 C) 98.2 F (36.8 C) 98.9 F (37.2 C) 98.6 F (37 C)  TempSrc: Oral  Oral Axillary  SpO2: 98% 98% 96% 97%  Weight:  148 lb 9.4 oz (67.4 kg)     Physical Exam Constitutional:      Comments: Appears in no acute distress  Eyes:     Extraocular Movements: EOM normal.  Cardiovascular:     Rate and Rhythm: Normal rate and regular rhythm.     Heart sounds: Normal heart sounds.  Pulmonary:     Effort: Pulmonary effort is normal.     Breath sounds: Normal breath sounds.  Abdominal:     General: Bowel sounds are normal.     Palpations: Abdomen is soft.     Comments: Midline scar of prior exploratory laparotomy and colostomy bag is seen  Musculoskeletal:     Cervical back: Normal range of motion.  Skin:    General: Skin is warm and dry.  Neurological:  Mental Status: He is alert.     Comments: Does not verbalize with me        CMP Latest Ref Rng & Units 08/23/2020  Glucose 70 - 99 mg/dL 128(H)  BUN 8 - 23 mg/dL 19  Creatinine 0.61 - 1.24 mg/dL 0.86  Sodium 135 - 145 mmol/L 137  Potassium 3.5 - 5.1 mmol/L 4.0  Chloride 98 - 111 mmol/L 103  CO2 22 - 32 mmol/L 23  Calcium 8.9 - 10.3 mg/dL 8.6(L)  Total Protein 6.5 - 8.1 g/dL -  Total Bilirubin 0.3 - 1.2 mg/dL -  Alkaline Phos 38 - 126 U/L -  AST 15 - 41 U/L -  ALT 0 - 44 U/L -   CBC Latest Ref Rng & Units 08/24/2020  WBC 4.0 - 10.5 K/uL -  Hemoglobin 13.0 - 17.0 g/dL 8.1(L)  Hematocrit 39.0 - 52.0 % -  Platelets 150 - 400 K/uL -    @IMAGES @  No results found.  Assessment and plan- Patient is a 80 y.o. adult with multiple comorbidities including prior bowel perforation and diverting colostomy, dementia and quadriplegia found to have a new near obstructing GE junction mass which was consistent with adenocarcinoma.  Oncology consulted for further goals of care  I have discussed patient's case with his primary care doctor as well as his wife and  daughter.  Patient has at least locally advanced esophageal adenocarcinoma given that he has a near obstructing GE junction lesion.  He has not undergone a systemic CT scan work-up to see if he has metastatic disease.  However that would not presently change management.  His performance status is 4 and he is not a candidate for systemic chemotherapy.  With the mass that is near obstructing options include either comfort feeding and proceeding with home hospice versus considering PEG tube.  This was already discussed with their primary care doctor and they do not desire a PEG tube at this time which I think is reasonable.  From a palliation standpoint palliative radiation could be considered which could help bleeding from the mass as well as possibly relieve some degree of obstruction allowing him to eat.  However palliative radiation is typically every day Monday to Friday for 4 weeks.  Patient's wife understands this and does not think that she would be able to bring him back and forth for radiation.  Explained to the patient's family that patient is likely to pass in the matter of weeks likely less than 3 months due to continued bleeding from the mass as well as inability to swallow.  They understand all their options at this time and are willing to proceed with home hospice and continue with comfort feeding.Dr. Wilford Grist who is his primary care doctor will be his hospice attending.   Total face to face encounter time for this patient visit was 45 min with greater than 50% of time spent in counseling and coordination of time.   Time spent in discussing patients case with Dr. Wilford Grist 15 min    Visit Diagnosis 1.  GE junction adenocarcinoma 2.  Goals of care counseling/discussion 3.  Acute blood loss anemia  Dr. Randa Evens, MD, MPH [facility] at [facility] [id number] 08/24/2020

## 2020-08-24 NOTE — Discharge Summary (Incomplete)
Physician Discharge Summary  Jason Harmon MCN:[id number] DOB: 26-Dec-1940 DOA: 08/20/2020  PCP: [name], MD  Admit date: 08/20/2020 Discharge date: 08/24/2020  Discharge disposition: ***   Recommendations for Outpatient Follow-Up:   1. *** (include homehealth, outpatient follow-up instructions, specific recommendations for PCP to follow-up on, etc.) 2. LACE score = ***, corresponding to a *** % of re-admission within 30 days.   (http://tools.farmacologiaclinica.info/index.php?sid=10044)   Discharge Diagnosis:   Principal Problem:   Acute upper GI bleed Active Problems:   AF (paroxysmal atrial fibrillation) (HCC)   Hypothyroidism   Acute on chronic anemia   Generalized weakness   Seizure disorder (HCC)   Esophageal mass    Discharge Condition: Stable.  Diet recommendation:  Diet Order            Diet general           Diet regular Room service appropriate? Yes; Fluid consistency: Thin  Diet effective now                   Code Status: DNR     Hospital Course:   ***  Case was discussed with his PCP, Dr. Wilford Grist  Medical Consultants:    ***   Discharge Exam:    Vitals:   08/23/20 2333 08/24/20 0348 08/24/20 0718 08/24/20 1100  BP: (!) 112/52 (!) 100/45 113/66 108/62  Pulse: 80 92 90 93  Resp: 17 20 16 18   Temp: 99.7 F (37.6 C) 98.2 F (36.8 C) 98.9 F (37.2 C) 98.6 F (37 C)  TempSrc: Oral  Oral Axillary  SpO2: 98% 98% 96% 97%  Weight:  67.4 kg       GEN: NAD SKIN: No rash*** EYES: EOMI*** ENT: MMM CV: RRR PULM: CTA B ABD: soft, ND, NT, +BS CNS: AAO x 3, non focal EXT: No edema or tenderness***   The results of significant diagnostics from this hospitalization (including imaging, microbiology, ancillary and laboratory) are listed below for reference.     Procedures and Diagnostic Studies:   No results found.   Labs:   Basic Metabolic Panel: Recent Labs  Lab 08/20/20 1208 08/20/20 1347 08/21/20 0048  08/22/20 0436 08/23/20 0553  NA 143  --  139 138 137  K 4.4  --  4.3 3.9 4.0  CL 102  --  103 107 103  CO2 29  --  26 24 23   GLUCOSE 114*  --  100* 131* 128*  BUN 34*  --  32* 30* 19  CREATININE 0.93  --  0.82 0.97 0.86  CALCIUM 9.0  --  8.4* 8.1* 8.6*  MG  --  2.1 2.0  --   --    GFR Estimated Creatinine Clearance (by C-G formula based on SCr of 0.86 mg/dL) Male: 51.3 mL/min Male: 64.9 mL/min Liver Function Tests: Recent Labs  Lab 08/21/20 0048  AST 19  ALT 10  ALKPHOS 65  BILITOT 0.4  PROT 6.3*  ALBUMIN 2.9*   No results for input(s): LIPASE, AMYLASE in the last 168 hours. No results for input(s): AMMONIA in the last 168 hours. Coagulation profile Recent Labs  Lab 08/20/20 1208 08/21/20 0048  INR 1.0 1.0    CBC: Recent Labs  Lab 08/20/20 1208 08/20/20 1923 08/21/20 0048 08/21/20 0847 08/22/20 0436 08/23/20 0553 08/24/20 1254  WBC 6.9  --  7.4  --  9.7 8.0  --   NEUTROABS  --   --   --   --  7.0  --   --  HGB 6.9* 7.7* 7.8* 8.3* 7.7* 7.9* 8.1*  HCT 22.4* 23.7* 24.6* 25.4* 23.3* 24.9*  --   MCV 103.2*  --  98.0  --  97.1 98.4  --   PLT 189  --  161  --  171 193  --    Cardiac Enzymes: No results for input(s): CKTOTAL, CKMB, CKMBINDEX, TROPONINI in the last 168 hours. BNP: Invalid input(s): POCBNP CBG: No results for input(s): GLUCAP in the last 168 hours. D-Dimer No results for input(s): DDIMER in the last 72 hours. Hgb A1c No results for input(s): HGBA1C in the last 72 hours. Lipid Profile No results for input(s): CHOL, HDL, LDLCALC, TRIG, CHOLHDL, LDLDIRECT in the last 72 hours. Thyroid function studies No results for input(s): TSH, T4TOTAL, T3FREE, THYROIDAB in the last 72 hours.  Invalid input(s): FREET3 Anemia work up No results for input(s): VITAMINB12, FOLATE, FERRITIN, TIBC, IRON, RETICCTPCT in the last 72 hours. Microbiology Recent Results (from the past 240 hour(s))  SARS Coronavirus 2 by RT PCR (hospital order, performed in [facility] hospital lab) Nasopharyngeal Nasopharyngeal Swab     Status: None   Collection Time: 08/20/20  1:30 PM   Specimen: Nasopharyngeal Swab  Result Value Ref Range Status   SARS Coronavirus 2 NEGATIVE NEGATIVE Final    Comment: (NOTE) SARS-CoV-2 target nucleic acids are NOT DETECTED.  The SARS-CoV-2 RNA is generally detectable in upper and lower respiratory specimens during the acute phase of infection. The lowest concentration of SARS-CoV-2 viral copies this assay can detect is 250 copies / mL. A negative result does not preclude SARS-CoV-2 infection and should not be used as the sole basis for treatment or other patient management decisions.  A negative result may occur with improper specimen collection / handling, submission of specimen other than nasopharyngeal swab, presence of viral mutation(s) within the areas targeted by this assay, and inadequate number of viral copies (<250 copies / mL). A negative result must be combined with clinical observations, patient history, and epidemiological information.  Fact Sheet for Patients:   StrictlyIdeas.no  Fact Sheet for Healthcare Providers: BankingDealers.co.za  This test is not yet approved or  cleared by the [place] FDA and has been authorized for detection and/or diagnosis of SARS-CoV-2 by FDA under an Emergency Use Authorization (EUA).  This EUA will remain in effect (meaning this test can be used) for the duration of the COVID-19 declaration under Section 564(b)(1) of the Act, 21 U.S.C. section 360bbb-3(b)(1), unless the authorization is terminated or revoked sooner.  Performed at [facility], [address], [place], [place] [address]      Discharge Instructions:   Discharge Instructions    Diet general   Complete by: As directed    Discharge instructions   Complete by: As directed    Follow-up with PCP, Dr. Wilford Grist, for hospice at home.   Increase  activity slowly   Complete by: As directed      Allergies as of 08/24/2020      Reactions   Penicillins    Sulfa Antibiotics    Vancomycin Rash   Zosyn [piperacillin Sod-tazobactam So] Rash      Medication List    TAKE these medications   acetaminophen 650 MG CR tablet Commonly known as: TYLENOL Take 1,300 mg by mouth in the morning and at bedtime.   Aspercreme Lidocaine 4 % Crea Generic drug: Lidocaine HCl Apply topically 3 (three) times daily.   bisacodyl 10 MG suppository Commonly known as: DULCOLAX Place 10 mg  rectally 2 (two) times a week.   Diastat AcuDial 10 MG Gel Generic drug: diazepam Place 10 mg rectally once.   divalproex 250 MG DR tablet Commonly known as: DEPAKOTE Take 1 tablet (250 mg total) by mouth every 12 (twelve) hours.   DULoxetine 30 MG capsule Commonly known as: CYMBALTA Take 30 mg by mouth 2 (two) times daily.   ENSURE ENLIVE PO Take 237 mLs by mouth in the morning and at bedtime.   guaiFENesin 100 MG/5ML liquid Commonly known as: ROBITUSSIN Take 200 mg by mouth 2 (two) times daily as needed for cough (to loosen phlegm).   Levothyroxine Sodium 88 MCG Caps Take by mouth daily before breakfast. Mon Wed Fri and Sat   levothyroxine 100 MCG tablet Commonly known as: SYNTHROID Take 100 mcg by mouth daily before breakfast. Sun Tue Thurs   liver oil-zinc oxide 40 % ointment Commonly known as: DESITIN Apply 1 application topically See admin instructions. Apply to irritated areas of gluteal and peri-anal skin with each diaper change until healed   Metoprolol Succinate 25 MG Cs24 Take 25 mg by mouth daily.   mineral oil-hydrophilic petrolatum ointment Apply 1 application topically 2 (two) times daily as needed for dry skin.   senna 8.6 MG Tabs tablet Commonly known as: SENOKOT Take 2 tablets by mouth at bedtime.   simethicone 80 MG chewable tablet Commonly known as: MYLICON Chew 80 mg by mouth every 6 (six) hours as needed for  flatulence.   traZODone 50 MG tablet Commonly known as: [place]         Time coordinating discharge: ***  Signed:  [facility] 08/24/2020, 3:42 PM   Pager on [email or web address]. If 7PM-7AM, please contact night-coverage at [email or web address]

## 2020-08-24 NOTE — Progress Notes (Addendum)
Progress Note    Jason Harmon  [id number] DOB: 06/12/1941  DOA: 08/20/2020 PCP: [name], MD      Brief Narrative:    Medical records reviewed and are as summarized below:  Jason Harmon is a 80 y.o. adult       Assessment/Plan:   Principal Problem:   Acute upper GI bleed Active Problems:   AF (paroxysmal atrial fibrillation) (HCC)   Hypothyroidism   Acute on chronic anemia   Generalized weakness   Seizure disorder ([place])   Esophageal cancer ([place])   Body mass index is 23.98 kg/m.  Acute upper GI bleeding: S/p EGD on 08/21/2020 which showed completely obstructing esophageal tumor at the GE junction.  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  Oncology was consulted per family's request.  Case was discussed with Dr. Wilford Grist, PCP, who requested oncology consult prior to discharge and patient home.  She also requested repeat hemoglobin prior to discharge.  Acute blood loss anemia, Iron deficiency anemia, chronic anemia  : Hemoglobin improved s/p transfusion with 1 unit of packed red blood cells on 08/20/2020.  Repeat hemoglobin is stable at 8.1.  Paroxysmal atrial fibrillation: Anticoagulation is contraindicated because of high risk for GI bleeding.  Generalized weakness: Supportive care  Seizure disorder: Continue home Depakote  Continue comfort care measures.  Family said they were not ready for the patient to come home today and requested that patient be discharged home tomorrow with hospice.   Diet Order            Diet general           Diet regular Room service appropriate? Yes; Fluid consistency: Thin  Diet effective now                    Consultants:  Gastroenterologist  Oncologist  Procedures:  EGD on 08/21/2020    Medications:   . sodium chloride   Intravenous Once   Continuous Infusions:    Anti-infectives (From admission, onward)   None             Family  Communication/Anticipated D/C date and plan/Code Status   DVT prophylaxis: SCDs Start: 08/20/20 1355     Code Status: DNR  Family Communication: None Disposition Plan:    Status is: Inpatient  Remains inpatient appropriate because:Unsafe d/c plan   Dispo: The patient is from: Home              Anticipated d/c is to: Home              Anticipated d/c date is: 1 day              Patient currently is not medically stable to d/c.   Difficult to place patient No           Subjective:   Interval events noted.  He cannot provide any meaningful history.  Objective:    Vitals:   08/24/20 0348 08/24/20 0718 08/24/20 1100 08/24/20 1613  BP: (!) 100/45 113/66 108/62 139/72  Pulse: 92 90 93 92  Resp: 20 16 18 16   Temp: 98.2 F (36.8 C) 98.9 F (37.2 C) 98.6 F (37 C) 98.5 F (36.9 C)  TempSrc:  Oral Axillary   SpO2: 98% 96% 97% 98%  Weight: 67.4 kg      No data found.   Intake/Output Summary (Last 24 hours) at 08/24/2020 1943 Last data filed at 08/24/2020 1828 Gross per 24 hour  Intake  240 ml  Output --  Net 240 ml   Filed Weights   08/23/20 0356 08/23/20 0541 08/24/20 0348  Weight: 67.4 kg 67.5 kg 67.4 kg    Exam:   GEN: NAD SKIN: Warm and dry EYES: No pallor or icterus ENT: MMM CV: RRR PULM: CTA B ABD: soft, ND, NT, +BS CNS: Alert but disoriented, non focal EXT: No edema or tenderness      Data Reviewed:   I have personally reviewed following labs and imaging studies:  Labs: Labs show the following:   Basic Metabolic Panel: Recent Labs  Lab 08/20/20 1208 08/20/20 1347 08/21/20 0048 08/22/20 0436 08/23/20 0553  NA 143  --  139 138 137  K 4.4  --  4.3 3.9 4.0  CL 102  --  103 107 103  CO2 29  --  26 24 23   GLUCOSE 114*  --  100* 131* 128*  BUN 34*  --  32* 30* 19  CREATININE 0.93  --  0.82 0.97 0.86  CALCIUM 9.0  --  8.4* 8.1* 8.6*  MG  --  2.1 2.0  --   --    GFR Estimated Creatinine Clearance (by C-G formula based on SCr  of 0.86 mg/dL) Male: 51.3 mL/min Male: 64.9 mL/min Liver Function Tests: Recent Labs  Lab 08/21/20 0048  AST 19  ALT 10  ALKPHOS 65  BILITOT 0.4  PROT 6.3*  ALBUMIN 2.9*   No results for input(s): LIPASE, AMYLASE in the last 168 hours. No results for input(s): AMMONIA in the last 168 hours. Coagulation profile Recent Labs  Lab 08/20/20 1208 08/21/20 0048  INR 1.0 1.0    CBC: Recent Labs  Lab 08/20/20 1208 08/20/20 1923 08/21/20 0048 08/21/20 0847 08/22/20 0436 08/23/20 0553 08/24/20 1254  WBC 6.9  --  7.4  --  9.7 8.0  --   NEUTROABS  --   --   --   --  7.0  --   --   HGB 6.9* 7.7* 7.8* 8.3* 7.7* 7.9* 8.1*  HCT 22.4* 23.7* 24.6* 25.4* 23.3* 24.9*  --   MCV 103.2*  --  98.0  --  97.1 98.4  --   PLT 189  --  161  --  171 193  --    Cardiac Enzymes: No results for input(s): CKTOTAL, CKMB, CKMBINDEX, TROPONINI in the last 168 hours. BNP (last 3 results) No results for input(s): PROBNP in the last 8760 hours. CBG: No results for input(s): GLUCAP in the last 168 hours. D-Dimer: No results for input(s): DDIMER in the last 72 hours. Hgb A1c: No results for input(s): HGBA1C in the last 72 hours. Lipid Profile: No results for input(s): CHOL, HDL, LDLCALC, TRIG, CHOLHDL, LDLDIRECT in the last 72 hours. Thyroid function studies: No results for input(s): TSH, T4TOTAL, T3FREE, THYROIDAB in the last 72 hours.  Invalid input(s): FREET3 Anemia work up: No results for input(s): VITAMINB12, FOLATE, FERRITIN, TIBC, IRON, RETICCTPCT in the last 72 hours. Sepsis Labs: Recent Labs  Lab 08/20/20 1208 08/21/20 0048 08/22/20 0436 08/23/20 0553  WBC 6.9 7.4 9.7 8.0    Microbiology Recent Results (from the past 240 hour(s))  SARS Coronavirus 2 by RT PCR (hospital order, performed in [facility] hospital lab) Nasopharyngeal Nasopharyngeal Swab     Status: None   Collection Time: 08/20/20  1:30 PM   Specimen: Nasopharyngeal Swab  Result Value Ref Range Status   SARS  Coronavirus 2 NEGATIVE NEGATIVE Final    Comment: (NOTE) SARS-CoV-2  target nucleic acids are NOT DETECTED.  The SARS-CoV-2 RNA is generally detectable in upper and lower respiratory specimens during the acute phase of infection. The lowest concentration of SARS-CoV-2 viral copies this assay can detect is 250 copies / mL. A negative result does not preclude SARS-CoV-2 infection and should not be used as the sole basis for treatment or other patient management decisions.  A negative result may occur with improper specimen collection / handling, submission of specimen other than nasopharyngeal swab, presence of viral mutation(s) within the areas targeted by this assay, and inadequate number of viral copies (<250 copies / mL). A negative result must be combined with clinical observations, patient history, and epidemiological information.  Fact Sheet for Patients:   StrictlyIdeas.no  Fact Sheet for Healthcare Providers: BankingDealers.co.za  This test is not yet approved or  cleared by the [place] FDA and has been authorized for detection and/or diagnosis of SARS-CoV-2 by FDA under an Emergency Use Authorization (EUA).  This EUA will remain in effect (meaning this test can be used) for the duration of the COVID-19 declaration under Section 564(b)(1) of the Act, 21 U.S.C. section 360bbb-3(b)(1), unless the authorization is terminated or revoked sooner.  Performed at [facility], [address] [place], [place] [address]     Procedures and diagnostic studies:  No results found.             LOS: 4 days   Avryl Roehm  [facility]   Pager on [email or web address]. If 7PM-7AM, please contact night-coverage at [email or web address]     08/24/2020, 7:43 PM

## 2020-08-24 NOTE — TOC Transition Note (Signed)
Transition of Care [facility]) - CM/SW Discharge Note   Patient Details  Name: Jason Harmon MRN: [id number] Date of Birth: 1941/05/31  Transition of Care [facility]) CM/SW Contact:  Kerin Salen, RN Phone Number: 08/24/2020, 3:16 PM   Clinical Narrative:       Final next level of care: Home w Hospice Care Barriers to Discharge: Other (comment) (Hospice at home with [facility])   Patient Goals and CMS Choice Patient states their goals for this hospitalization and ongoing recovery are:: Return home with Hospice.   Choice offered to / list presented to : NA  Discharge Placement  Patient to be discharged home today with Hospice via [facility]. Wife at bedside provided me with [facility] phone number, [phone]. Called and spoke with Dr. Wilford Grist, who informed me that they will assume responsibilities for Home Hospice care, have everything needed and will come to the home when discharge. Wife states she will call daughter when discharged.              Patient to be transferred to facility by: Wife at bedside states she will call Daughter, when ready for discharge. Name of family member notified: Wife Patient and family notified of of transfer: 08/24/20  Discharge Plan and Services                DME Arranged: Hospice Equipment Package A DME Agency: [facility] [place] Date DME Agency Contacted: 08/24/20 Time DME Agency Contacted: 23 Representative spoke with at DME Agency: Dr. Wilford Grist HH Arranged: NA [place] Agency: NA        Social Determinants of Health ([place]) Interventions     Readmission Risk Interventions No flowsheet data found.

## 2020-08-24 NOTE — Care Management Important Message (Signed)
Important Message  Patient Details  Name: Jason Harmon MRN: [id number] Date of Birth: 1941/01/14   Medicare Important Message Given:  Yes     [name] 08/24/2020, 11:42 AM

## 2020-08-25 NOTE — Plan of Care (Signed)
  Problem: Education: Goal: Knowledge of General Education information will improve Description Including pain rating scale, medication(s)/side effects and non-pharmacologic comfort measures Outcome: Progressing   

## 2020-08-25 NOTE — Plan of Care (Signed)
  Problem: Education: Goal: Knowledge of General Education information will improve Description: Including pain rating scale, medication(s)/side effects and non-pharmacologic comfort measures 08/25/2020 1112 by Alen Blew, RN Outcome: Adequate for Discharge 08/25/2020 1112 by Alen Blew, RN Outcome: Adequate for Discharge   Problem: Health Behavior/Discharge Planning: Goal: Ability to manage health-related needs will improve 08/25/2020 1112 by Alen Blew, RN Outcome: Adequate for Discharge 08/25/2020 1112 by Alen Blew, RN Outcome: Adequate for Discharge   Problem: Clinical Measurements: Goal: Ability to maintain clinical measurements within normal limits will improve 08/25/2020 1112 by Roanna Epley D, RN Outcome: Adequate for Discharge 08/25/2020 1112 by Alen Blew, RN Outcome: Adequate for Discharge Goal: Will remain free from infection 08/25/2020 1112 by Roanna Epley D, RN Outcome: Adequate for Discharge 08/25/2020 1112 by Alen Blew, RN Outcome: Adequate for Discharge Goal: Diagnostic test results will improve 08/25/2020 1112 by Alen Blew, RN Outcome: Adequate for Discharge 08/25/2020 1112 by Alen Blew, RN Outcome: Adequate for Discharge Goal: Respiratory complications will improve 08/25/2020 1112 by Alen Blew, RN Outcome: Adequate for Discharge 08/25/2020 1112 by Alen Blew, RN Outcome: Adequate for Discharge Goal: Cardiovascular complication will be avoided 08/25/2020 1112 by Alen Blew, RN Outcome: Adequate for Discharge 08/25/2020 1112 by Alen Blew, RN Outcome: Adequate for Discharge   Problem: Activity: Goal: Risk for activity intolerance will decrease 08/25/2020 1112 by Alen Blew, RN Outcome: Adequate for Discharge 08/25/2020 1112 by Alen Blew, RN Outcome: Adequate for Discharge   Problem: Nutrition: Goal: Adequate nutrition will be maintained 08/25/2020 1112 by Alen Blew, RN Outcome:  Adequate for Discharge 08/25/2020 1112 by Alen Blew, RN Outcome: Adequate for Discharge   Problem: Coping: Goal: Level of anxiety will decrease 08/25/2020 1112 by Alen Blew, RN Outcome: Adequate for Discharge 08/25/2020 1112 by Alen Blew, RN Outcome: Adequate for Discharge   Problem: Elimination: Goal: Will not experience complications related to bowel motility 08/25/2020 1112 by Alen Blew, RN Outcome: Adequate for Discharge 08/25/2020 1112 by Alen Blew, RN Outcome: Adequate for Discharge Goal: Will not experience complications related to urinary retention 08/25/2020 1112 by Alen Blew, RN Outcome: Adequate for Discharge 08/25/2020 1112 by Alen Blew, RN Outcome: Adequate for Discharge   Problem: Pain Managment: Goal: General experience of comfort will improve 08/25/2020 1112 by Alen Blew, RN Outcome: Adequate for Discharge 08/25/2020 1112 by Alen Blew, RN Outcome: Adequate for Discharge   Problem: Safety: Goal: Ability to remain free from injury will improve 08/25/2020 1112 by Roanna Epley D, RN Outcome: Adequate for Discharge 08/25/2020 1112 by Alen Blew, RN Outcome: Adequate for Discharge   Problem: Skin Integrity: Goal: Risk for impaired skin integrity will decrease 08/25/2020 1112 by Alen Blew, RN Outcome: Adequate for Discharge 08/25/2020 1112 by Alen Blew, RN Outcome: Adequate for Discharge

## 2020-08-25 NOTE — TOC Transition Note (Signed)
Transition of Care [facility]) - CM/SW Discharge Note   Patient Details  Name: Emerson Schreifels MRN: [id number] Date of Birth: January 12, 1941  Transition of Care [facility]) CM/SW Contact:  Eileen Stanford, LCSW Phone Number: 08/25/2020, 10:40 AM   Clinical Narrative:  [facility] dc [facility]. [facility] will pick up pt around 12:30. RN notified.     Final next level of care: Home w Hospice Care Barriers to Discharge: No Barriers Identified   Patient Goals and CMS Choice Patient states their goals for this hospitalization and ongoing recovery are:: Return home with Hospice.   Choice offered to / list presented to : NA  Discharge Placement              Patient chooses bed at:  [facility]) Patient to be transferred to facility by: [facility] Name of family member notified: Wife Patient and family notified of of transfer: 08/25/20  Discharge Plan and Services                DME Arranged: Hospice Equipment Package A DME Agency: [facility] [place] Date DME Agency Contacted: 08/24/20 Time DME Agency Contacted: 102 Representative spoke with at DME Agency: Dr. Wilford Grist HH Arranged: NA [place] Agency: NA        Social Determinants of Health ([place]) Interventions     Readmission Risk Interventions No flowsheet data found.

## 2020-08-25 NOTE — Discharge Summary (Signed)
Physician Discharge Summary  Jason Harmon HUD:[id number] DOB: [birth year] DOA: 08/20/2020  PCP: [name], MD  Admit date: 08/20/2020 Discharge date: 08/25/2020  Discharge disposition: Home with hospice   Recommendations for Outpatient Follow-Up:   Follow-up with hospice team at home   Discharge Diagnosis:   Principal Problem:   Acute upper GI bleed Active Problems:   AF (paroxysmal atrial fibrillation) (HCC)   Hypothyroidism   Acute on chronic anemia   Generalized weakness   Seizure disorder ([place])   Esophageal cancer ([place])    Discharge Condition: Stable.  Diet recommendation:  Diet Order            Diet general           Diet regular Room service appropriate? Yes; Fluid consistency: Thin  Diet effective now                   Code Status: DNR     Hospital Course:   Jason Harmon is a 80 y.o. 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  He was admitted to the hospital for acute upper GI bleeding and severe anemia with hemoglobin of 6.9.  He was transfused with 1 unit of packed red blood cells and H&H remained stable.  He was seen in consultation by the gastroenterologist and he underwent EGD which showed a nearly obstructing esophageal tumor at the GE junction.  Pathology report showed that esophageal mass was invasive moderately differentiated esophageal adenocarcinoma.  His family decided against PEG tube for enteral nutrition and opted for comfort measures with hospice.  Oncology was consulted to weigh in on prognosis.  His wife has decided to take him home for comfort  measures with hospice.  Case was discussed with his PCP, Dr. Wilford Grist, with [facility], who said she can provide hospice care at home.    Medical Consultants:    Oncologist  Gastroenterologist   Discharge Exam:    Vitals:   08/25/20 0500 08/25/20 0621 08/25/20 0646 08/25/20 0717  BP:  (!) 155/95 110/63 102/63  Pulse:  95 83 71  Resp:    18  Temp:  98.6 F (37 C)  98.4 F (36.9 C)  TempSrc:  Oral  Oral  SpO2:  91%  98%  Weight: 67 kg        GEN: NAD SKIN: Warm and dry EYES: No pallor or icterus ENT: MMM CV: RRR PULM: CTA B ABD: soft, ND, NT, +BS CNS: Alert but disoriented EXT: No edema or tenderness   The results of significant diagnostics from this hospitalization (including imaging, microbiology, ancillary and laboratory) are listed below for reference.     Procedures and Diagnostic Studies:   No results found.   Labs:   Basic Metabolic Panel: Recent Labs  Lab 08/20/20 1208 08/20/20 1347 08/21/20 0048 08/22/20 0436 08/23/20 0553  NA 143  --  139 138 137  K 4.4  --  4.3 3.9 4.0  CL 102  --  103 107 103  CO2 29  --  26 24 23   GLUCOSE 114*  --  100* 131* 128*  BUN 34*  --  32* 30* 19  CREATININE 0.93  --  0.82 0.97 0.86  CALCIUM 9.0  --  8.4* 8.1* 8.6*  MG  --  2.1 2.0  --   --    GFR Estimated Creatinine Clearance (by C-G formula based on SCr of 0.86 mg/dL) Male: 51.3 mL/min Male: 64.9 mL/min Liver Function Tests: Recent Labs  Lab 08/21/20 0048  AST 19  ALT 10  ALKPHOS 65  BILITOT 0.4  PROT 6.3*  ALBUMIN 2.9*   No results for input(s): LIPASE, AMYLASE in the last 168 hours. No results for input(s): AMMONIA in the last 168 hours. Coagulation profile Recent Labs  Lab 08/20/20 1208 08/21/20 0048  INR 1.0 1.0    CBC: Recent Labs  Lab 08/20/20 1208 08/20/20 1923 08/21/20 0048 08/21/20 0847 08/22/20 0436 08/23/20 0553 08/24/20 1254  WBC 6.9  --  7.4  --  9.7 8.0  --   NEUTROABS  --   --   --   --  7.0  --   --   HGB  6.9* 7.7* 7.8* 8.3* 7.7* 7.9* 8.1*  HCT 22.4* 23.7* 24.6* 25.4* 23.3* 24.9*  --   MCV 103.2*  --  98.0  --  97.1 98.4  --   PLT 189  --  161  --  171 193  --    Cardiac Enzymes: No results for input(s): CKTOTAL, CKMB, CKMBINDEX, TROPONINI in the last 168 hours. BNP: Invalid input(s): POCBNP CBG: No results for input(s): GLUCAP in the last 168 hours. D-Dimer No results for input(s): DDIMER in the last 72 hours. Hgb A1c No results for input(s): HGBA1C in the last 72 hours. Lipid Profile No results for input(s): CHOL, HDL, LDLCALC, TRIG, CHOLHDL, LDLDIRECT in the last 72 hours. Thyroid function studies No results for input(s): TSH, T4TOTAL, T3FREE, THYROIDAB in the last 72 hours.  Invalid input(s): FREET3 Anemia work up No results for input(s): VITAMINB12, FOLATE, FERRITIN, TIBC, IRON, RETICCTPCT in the last 72 hours. Microbiology Recent Results (from the past 240 hour(s))  SARS Coronavirus 2 by RT PCR (hospital order, performed in [facility] hospital lab) Nasopharyngeal Nasopharyngeal Swab     Status: None   Collection Time: 08/20/20  1:30 PM   Specimen: Nasopharyngeal Swab  Result Value Ref Range Status   SARS Coronavirus 2 NEGATIVE NEGATIVE Final    Comment: (NOTE) SARS-CoV-2 target nucleic acids are NOT DETECTED.  The SARS-CoV-2 RNA is generally detectable in upper and lower respiratory specimens during the acute phase of infection. The lowest concentration of SARS-CoV-2 viral copies this assay can detect is 250 copies / mL. A negative result does not preclude SARS-CoV-2 infection and should not be used as the sole basis for treatment or other patient management decisions.  A negative result may occur with improper specimen collection / handling, submission of specimen other than nasopharyngeal swab, presence of viral mutation(s) within the areas targeted by this assay, and inadequate number of viral copies (<250 copies / mL). A negative result must be combined with  clinical observations, patient history, and epidemiological information.  Fact Sheet for Patients:   StrictlyIdeas.no  Fact Sheet for Healthcare Providers: BankingDealers.co.za  This test is not yet approved or  cleared by the [place] FDA and has been authorized for detection and/or diagnosis of SARS-CoV-2 by FDA under an Emergency Use Authorization (EUA).  This EUA will remain in effect (meaning this test can be used) for the duration of the COVID-19 declaration under Section 564(b)(1) of the Act, 21 U.S.C. section 360bbb-3(b)(1), unless the authorization is terminated or revoked sooner.  Performed at [facility], [address], [place], [place] [address]      Discharge Instructions:   Discharge Instructions    Diet general   Complete by: As directed    Discharge instructions   Complete by: As directed    Follow-up with PCP, Dr. Wilford Grist, for hospice at home.   Increase activity slowly   Complete by: As directed      Allergies as of 08/25/2020      Reactions   Penicillins    Sulfa Antibiotics    Vancomycin Rash   Zosyn [piperacillin Sod-tazobactam So] Rash      Medication List    TAKE these medications   acetaminophen 650 MG CR tablet Commonly known as: TYLENOL Take 1,300 mg by mouth in the morning and at bedtime.   Aspercreme Lidocaine 4 % Crea Generic drug: Lidocaine HCl Apply topically 3 (three) times daily.   bisacodyl 10 MG suppository Commonly known as: DULCOLAX Place 10 mg rectally 2 (two) times a week.   Diastat AcuDial 10 MG Gel Generic drug: diazepam Place 10 mg rectally once.   divalproex 250 MG DR tablet Commonly known as: DEPAKOTE Take 1 tablet (250 mg total) by mouth every 12 (twelve) hours.   DULoxetine 30 MG capsule Commonly known as: CYMBALTA Take 30 mg by mouth 2 (two) times daily.   ENSURE ENLIVE PO Take 237 mLs by mouth in the morning and at bedtime.   guaiFENesin  100 MG/5ML liquid Commonly known as: ROBITUSSIN Take 200 mg by mouth 2 (two) times daily as needed for cough (to loosen phlegm).   Levothyroxine Sodium 88 MCG Caps Take by mouth daily before breakfast. Mon Wed Fri and Sat   levothyroxine 100 MCG tablet Commonly known as: SYNTHROID Take 100 mcg by mouth daily before breakfast. Sun Tue Thurs   liver oil-zinc oxide 40 % ointment Commonly known as: DESITIN Apply 1 application topically See admin instructions. Apply to irritated areas of gluteal and peri-anal skin with each diaper change until healed   Metoprolol Succinate 25 MG Cs24 Take 25 mg by mouth daily.   mineral oil-hydrophilic petrolatum ointment Apply 1 application topically 2 (two) times daily as needed for dry skin.   senna 8.6 MG Tabs tablet Commonly known as: SENOKOT Take 2 tablets by mouth at bedtime.   simethicone 80 MG chewable tablet Commonly known as: MYLICON Chew 80 mg by mouth every 6 (six) hours as needed for flatulence.   traZODone 50 MG tablet Commonly known as: DESYREL         Time coordinating discharge: 32 minutes  Signed:  Burch Marchuk  [facility] 08/25/2020, 8:40 AM   Pager on [email or web address]. If 7PM-7AM, please contact night-coverage at [email or web address]

## 2020-08-31 NOTE — Anesthesia Postprocedure Evaluation (Signed)
Anesthesia Post Note  Patient: Mylik Farler  Procedure(s) Performed: ESOPHAGOGASTRODUODENOSCOPY (EGD) WITH PROPOFOL (N/A )  Patient location during evaluation: Endoscopy Anesthesia Type: General Level of consciousness: awake and alert Pain management: pain level controlled Vital Signs Assessment: post-procedure vital signs reviewed and stable Respiratory status: spontaneous breathing, nonlabored ventilation, respiratory function stable and patient connected to nasal cannula oxygen Cardiovascular status: blood pressure returned to baseline and stable Postop Assessment: no apparent nausea or vomiting Anesthetic complications: no   No complications documented.   Last Vitals:  Vitals:   08/25/20 0646 08/25/20 0717  BP: 110/63 102/63  Pulse: 83 71  Resp:  18  Temp:  36.9 C  SpO2:  98%    Last Pain:  Vitals:   08/25/20 0822  TempSrc:   PainSc: Asleep                 Martha Clan

## 2020-12-16 DEATH — deceased
